# Patient Record
Sex: Male | Born: 1940
Health system: Southern US, Community
[De-identification: ages and names within clinical notes are randomized; demographics above are authoritative.]

## PROBLEM LIST (undated history)

## (undated) DIAGNOSIS — K5792 Diverticulitis of intestine, part unspecified, without perforation or abscess without bleeding: Secondary | ICD-10-CM

## (undated) DIAGNOSIS — C801 Malignant (primary) neoplasm, unspecified: Secondary | ICD-10-CM

## (undated) DIAGNOSIS — I10 Essential (primary) hypertension: Secondary | ICD-10-CM

## (undated) DIAGNOSIS — I609 Nontraumatic subarachnoid hemorrhage, unspecified: Secondary | ICD-10-CM

## (undated) DIAGNOSIS — Z860100 Personal history of colon polyps, unspecified: Secondary | ICD-10-CM

## (undated) DIAGNOSIS — E785 Hyperlipidemia, unspecified: Secondary | ICD-10-CM

## (undated) DIAGNOSIS — N200 Calculus of kidney: Secondary | ICD-10-CM

## (undated) DIAGNOSIS — M8580 Other specified disorders of bone density and structure, unspecified site: Secondary | ICD-10-CM

## (undated) DIAGNOSIS — C9000 Multiple myeloma not having achieved remission: Secondary | ICD-10-CM

## (undated) HISTORY — DX: Personal history of colon polyps, unspecified: Z86.0100

## (undated) HISTORY — DX: Nontraumatic subarachnoid hemorrhage, unspecified: I60.9

## (undated) HISTORY — DX: Multiple myeloma not having achieved remission: C90.00

## (undated) HISTORY — DX: Essential (primary) hypertension: I10

## (undated) HISTORY — DX: Other specified disorders of bone density and structure, unspecified site: M85.80

## (undated) HISTORY — DX: Personal history of colonic polyps: Z86.010

## (undated) HISTORY — DX: Hyperlipidemia, unspecified: E78.5

## (undated) HISTORY — DX: Diverticulitis of intestine, part unspecified, without perforation or abscess without bleeding: K57.92

## (undated) HISTORY — DX: Calculus of kidney: N20.0

## (undated) HISTORY — PX: OTHER SURGICAL HISTORY: SHX169

---

## 1999-02-12 DIAGNOSIS — I609 Nontraumatic subarachnoid hemorrhage, unspecified: Secondary | ICD-10-CM

## 1999-02-12 HISTORY — DX: Nontraumatic subarachnoid hemorrhage, unspecified: I60.9

## 1999-02-20 ENCOUNTER — Inpatient Hospital Stay (HOSPITAL_COMMUNITY): Admission: EM | Admit: 1999-02-20 | Discharge: 1999-02-27 | Payer: Self-pay | Admitting: Neurosurgery

## 1999-02-20 ENCOUNTER — Encounter: Payer: Self-pay | Admitting: Emergency Medicine

## 1999-03-06 ENCOUNTER — Ambulatory Visit (HOSPITAL_COMMUNITY): Admission: RE | Admit: 1999-03-06 | Discharge: 1999-03-06 | Payer: Self-pay | Admitting: Neurosurgery

## 1999-03-09 ENCOUNTER — Observation Stay (HOSPITAL_COMMUNITY): Admission: RE | Admit: 1999-03-09 | Discharge: 1999-03-09 | Payer: Self-pay | Admitting: Neurosurgery

## 2001-02-20 ENCOUNTER — Ambulatory Visit (HOSPITAL_COMMUNITY): Admission: RE | Admit: 2001-02-20 | Discharge: 2001-02-20 | Payer: Self-pay | Admitting: Gastroenterology

## 2001-02-20 ENCOUNTER — Encounter (INDEPENDENT_AMBULATORY_CARE_PROVIDER_SITE_OTHER): Payer: Self-pay | Admitting: Specialist

## 2006-06-10 ENCOUNTER — Encounter: Payer: Self-pay | Admitting: Family Medicine

## 2007-06-15 ENCOUNTER — Encounter: Payer: Self-pay | Admitting: Family Medicine

## 2008-05-24 ENCOUNTER — Encounter: Payer: Self-pay | Admitting: Family Medicine

## 2008-05-31 DIAGNOSIS — M899 Disorder of bone, unspecified: Secondary | ICD-10-CM | POA: Insufficient documentation

## 2008-05-31 DIAGNOSIS — Z860100 Personal history of colon polyps, unspecified: Secondary | ICD-10-CM | POA: Insufficient documentation

## 2008-05-31 DIAGNOSIS — Z8719 Personal history of other diseases of the digestive system: Secondary | ICD-10-CM

## 2008-05-31 DIAGNOSIS — M949 Disorder of cartilage, unspecified: Secondary | ICD-10-CM

## 2008-05-31 DIAGNOSIS — I10 Essential (primary) hypertension: Secondary | ICD-10-CM

## 2008-06-17 ENCOUNTER — Ambulatory Visit: Payer: Self-pay | Admitting: Family Medicine

## 2008-06-18 LAB — CONVERTED CEMR LAB
Alkaline Phosphatase: 114 units/L (ref 39–117)
BUN: 29 mg/dL — ABNORMAL HIGH (ref 6–23)
Basophils Absolute: 0 10*3/uL (ref 0.0–0.1)
Bilirubin, Direct: 0.1 mg/dL (ref 0.0–0.3)
CO2: 31 meq/L (ref 19–32)
Calcium: 9.6 mg/dL (ref 8.4–10.5)
Creatinine, Ser: 1.6 mg/dL — ABNORMAL HIGH (ref 0.4–1.5)
Eosinophils Absolute: 0.1 10*3/uL (ref 0.0–0.7)
Glucose, Bld: 106 mg/dL — ABNORMAL HIGH (ref 70–99)
HDL: 37.9 mg/dL — ABNORMAL LOW (ref >39.00)
Lymphocytes Relative: 20.2 % (ref 12.0–46.0)
MCHC: 34.3 g/dL (ref 30.0–36.0)
Neutrophils Relative %: 71.4 % (ref 43.0–77.0)
Platelets: 232 10*3/uL (ref 150.0–400.0)
RBC: 4.92 M/uL (ref 4.22–5.81)
RDW: 12.6 % (ref 11.5–14.6)
Total Bilirubin: 0.6 mg/dL (ref 0.3–1.2)
Total CHOL/HDL Ratio: 5
Triglycerides: 116 mg/dL (ref 0.0–149.0)

## 2008-07-03 ENCOUNTER — Ambulatory Visit: Payer: Self-pay | Admitting: Family Medicine

## 2008-07-08 LAB — CONVERTED CEMR LAB
CO2: 29 meq/L (ref 19–32)
GFR calc non Af Amer: 53.34 mL/min (ref >60)
Glucose, Bld: 106 mg/dL — ABNORMAL HIGH (ref 70–99)
Potassium: 4.4 meq/L (ref 3.5–5.1)
Sodium: 139 meq/L (ref 135–145)

## 2008-08-19 ENCOUNTER — Telehealth: Payer: Self-pay | Admitting: Family Medicine

## 2008-11-25 ENCOUNTER — Ambulatory Visit: Payer: Self-pay | Admitting: Family Medicine

## 2009-03-05 ENCOUNTER — Ambulatory Visit: Payer: Self-pay | Admitting: Family Medicine

## 2009-03-10 ENCOUNTER — Telehealth: Payer: Self-pay | Admitting: Family Medicine

## 2009-05-14 ENCOUNTER — Ambulatory Visit: Payer: Self-pay | Admitting: Family Medicine

## 2009-08-15 ENCOUNTER — Telehealth: Payer: Self-pay | Admitting: Family Medicine

## 2009-09-08 ENCOUNTER — Ambulatory Visit: Payer: Self-pay | Admitting: Family Medicine

## 2009-09-08 DIAGNOSIS — E785 Hyperlipidemia, unspecified: Secondary | ICD-10-CM | POA: Insufficient documentation

## 2009-09-08 LAB — CONVERTED CEMR LAB
HDL goal, serum: 40 mg/dL
LDL Goal: 100 mg/dL

## 2009-09-09 LAB — CONVERTED CEMR LAB
BUN: 23 mg/dL (ref 6–23)
CO2: 29 meq/L (ref 19–32)
Cholesterol: 154 mg/dL (ref 0–200)
GFR calc non Af Amer: 43.37 mL/min (ref >60)
Glucose, Bld: 97 mg/dL (ref 70–99)
HDL: 33.1 mg/dL — ABNORMAL LOW (ref >39.00)
Potassium: 4.8 meq/L (ref 3.5–5.1)
VLDL: 17.2 mg/dL (ref 0.0–40.0)

## 2009-12-15 ENCOUNTER — Ambulatory Visit: Payer: Self-pay | Admitting: Family Medicine

## 2010-02-02 ENCOUNTER — Other Ambulatory Visit: Payer: Self-pay | Admitting: Dermatology

## 2010-02-10 NOTE — Assessment & Plan Note (Signed)
Summary: ? fever? chest congestion/dm   Vital Signs:  Patient profile:   70 year old male Temp:     98.8 degrees F oral BP sitting:   110 / 68  (left arm) Cuff size:   regular  Vitals Entered By: Sid Falcon LPN (March 05, 2009 9:51 AM) CC: Fever, diverticulitis flare-up, Abdominal Pain   History of Present Illness: acute visit  patient seen with onset last Friday low-grade fever, chest congestion, nasal congestion, and mild body aches. Wife with similar respiratory symptoms. Those symptoms are slowly improving. Now has some left lower quadrant mild abdominal pain and soreness without radiation. Similar to previous diverticulitis flare. Decreased appetite no vomiting or diarrhea. Fever has ranged from 100.4-101 over the past couple of days. No fever today. No bloody stools.       This is a 70 year old man who presents with Abdominal Pain.  The patient reports anorexia, but denies nausea, vomiting, diarrhea, constipation, melena, hematochezia, and hematemesis.  The location of the pain is left lower quadrant.  The pain is described as constant and dull.  Associated symptoms include fever.  The patient denies the following symptoms: dysuria and dark urine.    Allergies: 1)  ! Penicillin V Potassium (Penicillin V Potassium)  Past History:  Past Medical History: Last updated: 06/17/2008 Colonic polyps, hx of Diverticulitis, hx of Hypertension Osteopenia hearing loss cigarettes Subcranial hemmorrhage Kidney stones PMH reviewed for relevance  Review of Systems      See HPI       The patient complains of anorexia and fever.  The patient denies weight loss, weight gain, chest pain, syncope, dyspnea on exertion, peripheral edema, prolonged cough, headaches, hemoptysis, melena, hematochezia, and severe indigestion/heartburn.    Physical Exam  General:  Well-developed,well-nourished,in no acute distress; alert,appropriate and cooperative throughout examination Eyes:  pupils  equal, pupils round, and pupils reactive to light.   Ears:  External ear exam shows no significant lesions or deformities.  Otoscopic examination reveals clear canals, tympanic membranes are intact bilaterally without bulging, retraction, inflammation or discharge. Hearing is grossly normal bilaterally. Mouth:  Oral mucosa and oropharynx without lesions or exudates.  Teeth in good repair. Neck:  No deformities, masses, or tenderness noted. Lungs:  Normal respiratory effort, chest expands symmetrically. Lungs are clear to auscultation, no crackles or wheezes. Heart:  normal rate and regular rhythm.   Abdomen:  soft and normal bowel sounds.  minimal tenderness left lower quadrant to deep palpation. No guarding or rebound. No masses Extremities:  No clubbing, cyanosis, edema, or deformity noted with normal full range of motion of all joints.   Cervical Nodes:  No lymphadenopathy noted   Impression & Recommendations:  Problem # 1:  DIVERTICULITIS, ACUTE (ICD-562.11) Assessment New start Cipro 750 mg two times a day for 10 days and add Flagyl in 1-2 days if not promptly improving.  Problem # 2:  VIRAL URI (ICD-465.9) Assessment: New  His updated medication list for this problem includes:    Aspirin Adult Low Strength 81 Mg Tbec (Aspirin) ..... Once daily  Complete Medication List: 1)  Lisinopril-hydrochlorothiazide 10-12.5 Mg Tabs (Lisinopril-hydrochlorothiazide) .... Once daily 2)  Aspirin Adult Low Strength 81 Mg Tbec (Aspirin) .... Once daily 3)  Ciprofloxacin Hcl 750 Mg Tabs (Ciprofloxacin hcl) .... One by mouth two times a day for 10 days  Patient Instructions: 1)  follow up promptly if you have any worsening abdominal pain or increased fever or if abdominal symptoms not improving over the next  couple of days Prescriptions: CIPROFLOXACIN HCL 750 MG TABS (CIPROFLOXACIN HCL) one by mouth two times a day for 10 days  #20 x 0   Entered and Authorized by:   Evelena Peat MD   Signed by:    Evelena Peat MD on 03/05/2009   Method used:   [facility] [address]       [address]       [place], [place]  [address]       Ph: [phone] or [phone]       Fax: [phone]   RxID:   [id number]

## 2010-02-10 NOTE — Progress Notes (Signed)
Summary: James Marks  Phone Note Call from Patient Call back at [employer] [phone]   Caller: Spouse Call For: James Peat MD Summary of Call: pt needs #90 with 3 refill on linsinopril -hctz 10-12.5mg  [facility] [phone] Initial call taken by: Heron Sabins,  August 15, 2009 10:00 AM    Prescriptions: LISINOPRIL-HYDROCHLOROTHIAZIDE 10-12.5 MG TABS (LISINOPRIL-HYDROCHLOROTHIAZIDE) once daily  #90 x 3   Entered by:   Sid Falcon LPN   Authorized by:   James Peat MD   Signed by:   Sid Falcon LPN on 61/60/7371   Method used:   Faxed to ...       [facility] ([facility])       [address] [place], [place]  [address]       Ph: [phone]       Fax: [phone]   RxID:   [id number]

## 2010-02-10 NOTE — Progress Notes (Signed)
Summary: Call-A-Nurse Report    Call-A-Nurse Triage Call Report Triage Record Num: [id number] Operator: Frederico Hamman Patient Name: James Marks Call Date & Time: 03/08/2009 5:07:25PM Patient Phone: [phone] PCP: Evelena Peat Patient Gender: Male PCP Fax : [phone] Patient DOB: [birth year] [facility] Name: [facility] Reason for Call: Melvyn Neth started on Cipro 750mg  BID for diverticulitis. Had onset of being jittery on 2/24 and cannot sleep. States no improvement in diarrhea. States stomach does not feel better. C/O weakness. Having tremors in hands- onset 2/24. Less appetite. Dr. Clent Ridges paged. Advised to stop Cipro. Verbal order given for Metronidazole 500 mg po BID x 10 days. [phone]. Advised pt to go to ED this weekend if fever occurs. If feeling no better on 2/28 pt to call office to schedule same day appt for evaluation. Care advice given. Protocol(s) Used: Diarrhea / Change in Bowel Habits Recommended Outcome per Protocol: See Provider within 24 hours Reason for Outcome: Diarrhea lasting longer than 48 hours AND not improving with home care Care Advice: Go to the ED if you have developed signs and symptoms of dehydration such as dry mouth and tongue; increased pulse rate at rest; concentrated urine; no urine output for 8 hours or more; increasing weakness or drowsiness, or lightheadedness when trying to sit upright or standing.  ~  ~ SYMPTOM / CONDITION MANAGEMENT 03/08/2009 5:44:33PM Page 1 of 1 [id number]

## 2010-02-10 NOTE — Assessment & Plan Note (Signed)
Summary: flu shot/cjr  Nurse Visit Flu Vaccine Consent Questions     Do you have a history of severe allergic reactions to this vaccine? no    Any prior history of allergic reactions to egg and/or gelatin? no    Do you have a sensitivity to the preservative Thimersol? no    Do you have a past history of Guillan-Barre Syndrome? no    Do you currently have an acute febrile illness? no    Have you ever had a severe reaction to latex? no    Vaccine information given and explained to patient? yes    Are you currently pregnant? no    Lot Number:[id number]   Exp Date:07/11/2010   Site Given  Left Deltoid IM   Allergies: 1)  ! Penicillin V Potassium (Penicillin V Potassium)  Orders Added: 1)  Flu Vaccine 72yrs + MEDICARE PATIENTS [Q2039] 2)  Administration Flu vaccine - MCR [G0008]

## 2010-02-10 NOTE — Assessment & Plan Note (Signed)
Summary: emp---will fast//ccm   Vital Signs:  Patient profile:   70 year old male Height:      74 inches Weight:      183 pounds BMI:     23.58 Temp:     97.6 degrees F oral Pulse rate:   60 / minute Pulse rhythm:   regular Resp:     12 per minute BP sitting:   132 / 68  (left arm) Cuff size:   regular  Vitals Entered By: Sid Falcon LPN (September 08, 2009 8:31 AM) CC: CPX, fasting for labs, Hypertension Management, Lipid Management   History of Present Illness: Here for Medicare AWV:  1.   Risk factors based on Past M, S, F history:  Smoker.  Hypertension well controlled. Pos FH of diabetes in parent.  Hx of benign colon polyps. 2.   Physical Activities: Plays golf frequently.  NO formal exercise. 3.   Depression/mood: No hx of depression.  No anxiety issues.  No issues of anhedonia, sleep disturbance, low motivation, feelings of helplessness, or depressed mood. 4.   Hearing: hearing defecit.  Refuses hearing aid adjustment.  Only has aid for L ear.  Has some difficulty with high pitch noise and conversation.  Has seen audiologist within the past year. 5.   ADL's: Fully independent in all ADLs. 6.   Fall Risk: low.  He has good vision.  No balance issues.  No orthopedic impairments. No vestibular problems. 7.   Home Safety: No concerns identified.  no fall risk factors. 8.   Height, weight, &visual acuity:  Ht and weight stable.  Long distance vision is normal. 9.   Counseling: Smoking cessation counseling provided.  Needs more regular exercise.  Flu vaccine by Oct.  He will check on Zostavax coverage. 10.   Labs ordered based on risk factors: lipid, BMP, PSA. 11.           Referral Coordination  No referrals needed at this time.  He is encouraged to f/u with audiology regarding hearing aid issues. 12.           Care Plan- labs as above.  Meds refilled.  Check on shingles vaccine. Flu vaccine by Oct. 13.            Cognitive Assessment  Pt has no problems with short term or long  term memory.  No cognitive impairments.  Good reasoning skills.  Able to think independently.   Also here for follow up medical problems.  Htn.  Also has hx of mild hperlipidemia.  No hx of CAD.   Hypertension History:      He denies headache, chest pain, palpitations, dyspnea with exertion, orthopnea, PND, peripheral edema, visual symptoms, neurologic problems, syncope, and side effects from treatment.  He notes no problems with any antihypertensive medication side effects.        Positive major cardiovascular risk factors include male age 54 years old or older, hyperlipidemia, hypertension, and current tobacco user.  Negative major cardiovascular risk factors include no history of diabetes and negative family history for ischemic heart disease.        Further assessment for target organ damage reveals no history of ASHD, stroke/TIA, or peripheral vascular disease.    Lipid Management History:      Positive NCEP/ATP III risk factors include male age 73 years old or older, HDL cholesterol less than 40, current tobacco user, and hypertension.  Negative NCEP/ATP III risk factors include non-diabetic, no family history for ischemic heart  disease, no ASHD (atherosclerotic heart disease), no prior stroke/TIA, no peripheral vascular disease, and no history of aortic aneurysm.      Clinical Review Panels:  Prevention   Last Colonoscopy:  normal (01/11/2006)   Last PSA:  0.80 (06/17/2008)  Immunizations   Last Tetanus Booster:  Historical (01/11/2005)   Last Flu Vaccine:  Fluvax 3+ (11/25/2008)   Last Pneumovax:  Historical (01/12/2004)   Allergies: 1)  ! Penicillin V Potassium (Penicillin V Potassium)  Past History:  Past Medical History: Last updated: 06/17/2008 Colonic polyps, hx of Diverticulitis, hx of Hypertension Osteopenia hearing loss cigarettes Subcranial hemmorrhage Kidney stones  Family History: Last updated: 06/17/2008 Family history breast cancer, mother Family  history diabetes, parent  Social History: Last updated: 06/17/2008 Retired, Airline pilot Married Current Smoker Alcohol use-yes  Risk Factors: Smoking Status: current (06/17/2008) PMH-FH-SH reviewed for relevance  Review of Systems       The patient complains of decreased hearing.  The patient denies anorexia, fever, weight loss, weight gain, vision loss, hoarseness, chest pain, syncope, dyspnea on exertion, peripheral edema, prolonged cough, headaches, hemoptysis, abdominal pain, melena, hematochezia, severe indigestion/heartburn, hematuria, incontinence, muscle weakness, suspicious skin lesions, transient blindness, difficulty walking, depression, enlarged lymph nodes, and testicular masses.    Physical Exam  General:  Well-developed,well-nourished,in no acute distress; alert,appropriate and cooperative throughout examination  Somewhat hard of hearing. Head:  Normocephalic and atraumatic without obvious abnormalities. No apparent alopecia or balding. Eyes:  pupils equal, pupils round, and pupils reactive to light.   Ears:  External ear exam shows no significant lesions or deformities.  Otoscopic examination reveals clear canals, tympanic membranes are intact bilaterally without bulging, retraction, inflammation or discharge. Hearing is grossly normal bilaterally. Mouth:  Oral mucosa and oropharynx without lesions or exudates.  Teeth in good repair. Neck:  No deformities, masses, or tenderness noted. Lungs:  Normal respiratory effort, chest expands symmetrically. Lungs are clear to auscultation, no crackles or wheezes. Heart:  normal rate, regular rhythm, and no murmur.   Abdomen:  Bowel sounds positive,abdomen soft and non-tender without masses, organomegaly or hernias noted. Rectal:  No external abnormalities noted. Normal sphincter tone. No rectal masses or tenderness. Prostate:  Prostate gland firm and smooth, no enlargement, nodularity, tenderness, mass, asymmetry or induration. Msk:  No  deformity or scoliosis noted of thoracic or lumbar spine.   Extremities:  No clubbing, cyanosis, edema, or deformity noted with normal full range of motion of all joints.   Neurologic:  alert & oriented X3, cranial nerves II-XII intact, strength normal in all extremities, and gait normal.   Skin:  multiple seb keratoses. Cervical Nodes:  No lymphadenopathy noted Psych:  normally interactive, good eye contact, not anxious appearing, and not depressed appearing.     Impression & Recommendations:  Problem # 1:  HYPERTENSION (ICD-401.9)  His updated medication list for this problem includes:    Lisinopril-hydrochlorothiazide 10-12.5 Mg Tabs (Lisinopril-hydrochlorothiazide) ..... Once daily  Orders: Venipuncture (81191) Specimen Handling (47829) TLB-BMP (Basic Metabolic Panel-BMET) (80048-METABOL)  Problem # 2:  HYPERLIPIDEMIA (ICD-272.4)  Orders: Venipuncture (56213) Specimen Handling (08657) TLB-Lipid Panel (80061-LIPID)  Problem # 3:  ROUTINE GENERAL MEDICAL EXAM@HEALTH  CARE FACL (ICD-V70.0)  Orders: Venipuncture (84696) Specimen Handling (29528) Medicare -1st Annual Wellness Visit [id number]) TLB-PSA (Prostate Specific Antigen) (84153-PSA)  Complete Medication List: 1)  Lisinopril-hydrochlorothiazide 10-12.5 Mg Tabs (Lisinopril-hydrochlorothiazide) .... Once daily 2)  Aspirin Adult Low Strength 81 Mg Tbec (Aspirin) .... Once daily  Hypertension Assessment/Plan:      The patient's hypertensive risk group  is category B: At least one risk factor (excluding diabetes) with no target organ damage.  His calculated 10 year risk of coronary heart disease is 40 %.  Today's blood pressure is 132/68.    Lipid Assessment/Plan:      Based on NCEP/ATP III, the patient's risk factor category is "0-1 risk factors".  The patient's lipid goals are as follows: Total cholesterol goal is 200; LDL cholesterol goal is 100; HDL cholesterol goal is 40; Triglyceride goal is 150.    Patient  Instructions: 1)  Stop smoking tips: Choose a quit date. Cut down before the quit date. Decide what you will do as a substitute when you feel the urge to smoke(gum, toothpick, exercise).  2)  It is important that you exercise reguarly at least 20 minutes 5 times a week. If you develop chest pain, have severe difficulty breathing, or feel very tired, stop exercising immediately and seek medical attention.

## 2010-02-10 NOTE — Assessment & Plan Note (Signed)
Summary: severe lower back pain/cjr   Vital Signs:  Patient profile:   70 year old male Temp:     98.7 degrees F oral BP sitting:   102 / 64  (left arm) Cuff size:   regular  Vitals Entered By: Sid Falcon LPN (May 14, 1608 9:32 AM) CC: low back pain X 5 days   History of Present Illness: Acute visit for low back pain. Started last weekend after lifting a mattress. Pain lower lumbar area without radiation. Worse with flexion and sitting. Improved with standing. No radiculopathy symptoms. Denies any numbness or weakness. Aleve and ice have not helped. No incontinence symptoms. Denies any constitutional symptoms such as fever, chills, dysuria, appetite, or weight changes.  Allergies: 1)  ! Penicillin V Potassium (Penicillin V Potassium)  Past History:  Past Medical History: Last updated: 06/17/2008 Colonic polyps, hx of Diverticulitis, hx of Hypertension Osteopenia hearing loss cigarettes Subcranial hemmorrhage Kidney stones  Review of Systems      See HPI  Physical Exam  General:  Well-developed,well-nourished,in no acute distress; alert,appropriate and cooperative throughout examination Lungs:  Normal respiratory effort, chest expands symmetrically. Lungs are clear to auscultation, no crackles or wheezes. Heart:  normal rate and regular rhythm.   Msk:  slightly tender lower lumbar area diffusely straight leg raise is negative bilaterally Extremities:  no edema Neurologic:  no strength deficits. Normal sensory function. Symmetric reflexes lower extremities   Impression & Recommendations:  Problem # 1:  LUMBAGO (ICD-724.2) trial of anti-inflammatory and low dose muscle relaxer. Consider physical therapy if no better 2 weeks His updated medication list for this problem includes:    Aspirin Adult Low Strength 81 Mg Tbec (Aspirin) ..... Once daily    Cyclobenzaprine Hcl 5 Mg Tabs (Cyclobenzaprine hcl) ..... One by mouth q hs as needed back pain    Diclofenac Sodium  75 Mg Tbec (Diclofenac sodium) ..... One by mouth two times a day as needed back pain  Complete Medication List: 1)  Lisinopril-hydrochlorothiazide 10-12.5 Mg Tabs (Lisinopril-hydrochlorothiazide) .... Once daily 2)  Aspirin Adult Low Strength 81 Mg Tbec (Aspirin) .... Once daily 3)  Ciprofloxacin Hcl 750 Mg Tabs (Ciprofloxacin hcl) .... One by mouth two times a day for 10 days 4)  Cyclobenzaprine Hcl 5 Mg Tabs (Cyclobenzaprine hcl) .... One by mouth q hs as needed back pain 5)  Diclofenac Sodium 75 Mg Tbec (Diclofenac sodium) .... One by mouth two times a day as needed back pain  Patient Instructions: 1)  Most patients (90%) with low back pain will improve with time ( 2-6 weeks). Keep active but avoid activities that are painful. Apply moist heat and/or ice to lower back several times a day.  Prescriptions: DICLOFENAC SODIUM 75 MG TBEC (DICLOFENAC SODIUM) one by mouth two times a day as needed back pain  #60 x 0   Entered and Authorized by:   Evelena Peat MD   Signed by:   Evelena Peat MD on 05/14/2009   Method used:   [facility]        [facility] [address] [facility]       [address]       [place], [place]  [address]       Ph: [phone] or [phone]       Fax: [phone]   RxID:   [id number] CYCLOBENZAPRINE HCL 5 MG TABS (CYCLOBENZAPRINE HCL) one by mouth q hs as needed back pain  #20 x 0   Entered and  Authorized by:   Evelena Peat MD   Signed by:   Evelena Peat MD on 05/14/2009   Method used:   [facility]        [facility]  [place] [address] [facility]       [address]       [place], [place]  [address]       Ph: [phone] or [phone]       Fax: [phone]   RxID:   [id number]   Appended Document: Orders Update    Clinical Lists Changes  Orders: Added new Service order of Prescription Created Electronically [phone]) - Signed Added new Service order of Est. Patient Level III (02542) - Signed

## 2010-05-13 ENCOUNTER — Encounter: Payer: Self-pay | Admitting: Family Medicine

## 2010-05-13 ENCOUNTER — Ambulatory Visit (INDEPENDENT_AMBULATORY_CARE_PROVIDER_SITE_OTHER): Payer: Medicare Other | Admitting: Family Medicine

## 2010-05-13 VITALS — BP 102/82 | Temp 98.2°F | Ht 74.0 in | Wt 181.0 lb

## 2010-05-13 DIAGNOSIS — R05 Cough: Secondary | ICD-10-CM

## 2010-05-13 MED ORDER — DOXYCYCLINE HYCLATE 100 MG PO TABS: 100.0000 mg | ORAL_TABLET | Freq: Two times a day (BID) | ORAL | Status: AC

## 2010-05-13 NOTE — Patient Instructions (Signed)
Be in touch if cough not resolved in 2 weeks.

## 2010-05-13 NOTE — Progress Notes (Signed)
  Subjective:    Patient ID: James Marks, male    DOB: [birth year], 70 y.o.   MRN: [id number]  HPI Patient seen with 2 week history of productive cough. Long history of smoking. No dyspnea. No wheezing. Denies any hemoptysis, fever, chills, or any pleuritic pain. Minimal nasal congestive symptoms. Has continued to play golf regularly. No clear increase in dyspnea. Denies chest pain.   Review of Systems  Constitutional: Negative for fever, chills, activity change, appetite change, fatigue and unexpected weight change.  HENT: Negative for ear pain, congestion, sore throat and trouble swallowing.   Respiratory: Positive for cough. Negative for shortness of breath, wheezing and stridor.   Cardiovascular: Negative for chest pain and leg swelling.  Gastrointestinal: Negative for abdominal pain.  Musculoskeletal: Negative for arthralgias.  Skin: Negative for rash.  Neurological: Negative for syncope and headaches.  Hematological: Negative for adenopathy.       Objective:   Physical Exam  Constitutional: He appears well-developed and well-nourished.  HENT:  Right Ear: External ear normal.  Left Ear: External ear normal.  Mouth/Throat: No oropharyngeal exudate.  Cardiovascular: Normal rate, regular rhythm and normal heart sounds.   Pulmonary/Chest: Effort normal and breath sounds normal. No respiratory distress. He has no wheezes. He has no rales.  Musculoskeletal: He exhibits no edema.  Lymphadenopathy:    He has no cervical adenopathy.          Assessment & Plan:  Cough in a patient with likely some COPD. Encouraged to stop smoking. Doxycycline 100 mg twice daily for 10 days. Chest x-ray if no better in 2 weeks

## 2010-05-29 NOTE — H&P (Signed)
[place]. [facility]  Patient:    James Marks, James Marks                      MRN: [id number] Adm. Date:  [id number] Attending:  Tressie Stalker D Dictator:   Cristi Loron, M.D. CC:         [facility]. [facility]                         History and Physical  CHIEF COMPLAINT:  Headache.  HISTORY OF PRESENT ILLNESS:  The patient is a 70 year old white male who was in his usual state of health until this evening at approximately 1800 when he was driving home and had acute onset of headache without any precipitating trauma.  He drove home and discussed this with his wife and his wife called their family doctor, r. Waltrin, and he recommended they go to the emergency department.  They called 911 and the patient was transported to [facility] Emergency Department via EMS.  He was evaluated by Dr. Verlan Friends including cranial CT scan that demonstrated subarachnoid hemorrhage, and neurosurgical consultation was requested.  The patient complains of headache and meningismus.  He denies photophobia.  He as had no seizures, nausea, or vomiting.  His headache is severe.  There is no loss of consciousness.  PAST MEDICAL HISTORY:  Positive for diverticulitis/diverticulosis.  He had a recent flare-up and has been treated with Cipro b.i.d. for this.  The patient has decreased hearing bilaterally (chronic).  PAST SURGICAL HISTORY:  Colonic polypectomy, which was benign.  MEDICATIONS PRIOR TO ADMISSION: 1.  Cipro b.i.d.  ALLERGIES:  No known drug allergies.  FAMILY HISTORY:  The patients mother died at age 77, she had Alzheimers disease  and hypertension and diabetes mellitus.  The patients father is alive at age 96.  SOCIAL HISTORY:  The patient is married and has one stepson, one Museum/gallery conservator, nd one son.  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  He smokes 1/2 pack per day of cigarettes for approximately 45 years.  He denies ethanol or drug  use.  [place]  REVIEW OF SYSTEMS:  Negative except as above.  PHYSICAL EXAMINATION:  GENERAL:  Pleasant, well-developed, well-nourished 70 year old white male complaining of severe headache.  VITAL SIGNS:  Temperature 96.8 degrees Fahrenheit orally.  Heart rate 77, respiratory rate 16, blood pressure 131/73.  HEENT:  Head normocephalic, atraumatic.  PERRL.  EOMI.  Sclerae white. Conjunctivae pink.  Oropharynx benign.  NECK:  Somewhat tender to palpation.  He does have meningismus.  No masses, deformities, tracheal deviation, jugular venous distention, or carotid bruits.  Thorax symmetric.  LUNGS:  Clear to auscultation.  HEART:  Regular rate and rhythm.  ABDOMEN:  Soft, nontender.  EXTREMITIES:  No obvious deformities.  NEUROLOGIC:  The patient is alert and oriented x three.  Glasgow coma scale 15.  Cranial nerves II-XII grossly intact bilaterally except he has decreased hearing bilaterally (chronic).  He wears hearing aids.  Vision is grossly normal bilaterally.  Motor strength 5/5 in deltoids, biceps, triceps, hand grips, wrist extensors, interosseous psoas, quadriceps, gastrocnemius, extensor hallucis longus. Deep tendon reflexes are 2+/4 in bilateral triceps, brachial radialis, quadriceps, gastrocnemius, extensor plantar reflexes bilaterally.  No ankle clonus. Sensory exam is grossly normal to light touch in all dermatomes bilaterally. Cerebellar exam is intact.  Good rapid alternating movements of the upper extremities bilaterally.  IMAGING STUDIES:  I have reviewed the patients cranial CT scan performed at [facility] on February 20, 1999.  It demonstrates that he has a subarachnoid  hemorrhage in the basal cisterns and also has some blood in the fourth ventricle with some mild ventriculomegaly.  ASSESSMENT/PLAN:   Grade II subarachnoid hemorrhage.  I have discussed the situation with the patient and his wife and  children.  I have told them that in the absence of trauma the most likely cause for his hemorrhage would be aneurysm, though he also could have parametra cephalic hemorrhage.  I have recommended that we transfer him to [facility] for further evaluation and management and that he get a cerebral arteriogram to see if he has an aneurysm.  I will start im on Decadron, Dilantin, Pepcid, amlodipine.  The patients family is agreeable to  this plan. If the patients arteriogram turns out to show an aneurysm I may possibly do surgery tomorrow morning.  History of diverticulosis noted. DD:  02/20/99 TD:  02/21/99 Job: [id number]

## 2010-05-29 NOTE — Op Note (Signed)
[place]. [facility]  Patient:    James Marks, James Marks Visit Number: [id number] MRN: [id number]          Service Type: END Location: ENDO Attending Physician:  Nelda Marseille Dictated by:   Petra Kuba, M.D. Proc. Date: 02/20/01 Admit Date:  02/20/2001   CC:         Nolon Nations, M.D., M.P.H.   Operative Report  PROCEDURE:  Colonoscopy with biopsy.  INDICATION:  Screening in a patient with a history of colon polyps.  Consent was signed after risks, benefits, methods, options thoroughly discussed in the office in the past and before this procedure.  MEDICATIONS:  Demerol 75 mg, Versed 7.5 mg.  DESCRIPTION OF PROCEDURE:  Rectal inspection was pertinent for external hemorrhoids, small.  Digital exam was negative.  Video pediatric adjustable colonoscope was inserted and easily advanced around the colon to the cecum. This did not require any abdominal pressure or any position changes.  On insertion, some left-sided diverticula and wall edema were seen but no other abnormality.  The cecum was identified by the appendiceal orifice and the ileocecal valve.  The prep was adequate.  There was some liquid stool that required washing and suctioning.  On slow withdrawal through the colon, the cecum and the ascending were normal.  There was a probable midtransverse lipoma, which was cold biopsied but had a customary appearance and soft, marshmallow-like consistency with biopsies.  The scope was further withdrawn. The sigmoid did have some diverticula, wall edema, and some inflammation, difficult to inflate.  No obvious polypoid lesions or masses were seen, but we did take scattered biopsies of some of the edematous folds just to make sure, but he did not hold air well in this location.  The scope was further withdrawn.  There were multiple tiny hyperplastic-appearing distal sigmoid and rectal polyps, which were cold biopsied, put in a third container.   The scope was retroflexed, pertinent for some internal hemorrhoids.  One tiny polyp was biopsied on retroflexion.  The scope was straightened and air was suctioned, scope removed.  The patient tolerated the procedure well.  There was no obvious immediate complication.  ENDOSCOPIC DIAGNOSES: 1. Internal-external hemorrhoids. 2. Multiple tiny rectal-distal sigmoid questionable polyps, hyperplastic-    appearing, cold biopsied. 3. Sigmoid diverticula and wall edema and inflammation, biopsied. 4. Transverse lipoma, status post biopsy. 5. Otherwise within normal limits to the cecum.  PLAN:  Await pathology.  GI follow-up p.r.n., otherwise yearly rectals per Dr. Corine Shelter.  Continue Metamucil, since that helps his diverticula and prevents recurrence. Dictated by:   Petra Kuba, M.D. Attending Physician:  Nelda Marseille DD:  02/20/01 TD:  02/20/01 Job: [id number]

## 2010-05-29 NOTE — Discharge Summary (Signed)
[place]. [facility]  Patient:    James Marks, James Marks                      MRN: [id number] Adm. Date:  [id number] Disc. Date: [id number] Attending:  Tressie Stalker D CC:         Dr. Loistine Simas; [facility]                           Discharge Summary  NOTE:  For full details of this admission, please refer to the typed history and physical.  BRIEF HISTORY:  The patient is a 70 year old, white male who had a sudden onset of headache on February 20, 1999.  He was worked up at [facility], and a ______ CT scan demonstrated a subarachnoid hemorrhage.  I subsequently admitted the patient for further workup of nontraumatic subarachnoid hemorrhage.  PAST MEDICAL HISTORY/PAST SURGICAL HISTORY/MEDICATIONS PRIOR TO ADMISSION/DRUG ALLERGIES/FAMILY MEDICAL HISTORY/SOCIAL HISTORY/ADMISSION PHYSICAL EXAMINATION/ADMITTING ______/ASSESSMENT/PLAN/ETC.:  Please refer to the typed history and physical.  HOSPITAL COURSE:  I admitted the patient to [facility] on February 20, 1999 with a diagnosis of a nontraumatic subarachnoid hemorrhage. I obtained a cerebral arteriogram on the day of admission, and it demonstrated no evidence of intracranial aneurysms.  The patient was subsequently monitored in the intensive care unit.  His headaches improved over several days.  I obtained serial CTs, and transcranial Dopplers.  They demonstrated no evidence of hydrocephalus or cerebral vasospasm.  By February 27, 1999, the patient was afebrile.  Vital signs were stable.  He was neurologically normal.  His headache had improved.  There was no evidence of hydrocephalus or vasospasm as above, and he was requesting to be discharged to home.  I therefore discharged him home on February 27, 1999.  DISCHARGE INSTRUCTIONS:  We made arrangements for an outpatient repeat cerebral arteriogram to rule-out the slight possibility that he could have an aneurysm.  He  was instructed to follow up with me in one week.  DISCHARGE MEDICATION:  Vicodin p.r.n. for headache.  FINAL DIAGNOSE:  Perimesencephalic hemorrhage.  PROCEDURE PERFORMED:  Cerebral arteriogram. DD:  03/26/99 TD:  03/26/99 Job: [id number]

## 2010-07-09 ENCOUNTER — Other Ambulatory Visit: Payer: Self-pay | Admitting: *Deleted

## 2010-07-09 MED ORDER — LISINOPRIL-HYDROCHLOROTHIAZIDE 10-12.5 MG PO TABS: 1.0000 | ORAL_TABLET | Freq: Every day | ORAL | Status: DC

## 2010-07-09 NOTE — Telephone Encounter (Signed)
Rx sent to [facility]. 

## 2010-07-21 ENCOUNTER — Encounter: Payer: Self-pay | Admitting: Family Medicine

## 2010-09-10 ENCOUNTER — Encounter: Payer: Self-pay | Admitting: Family Medicine

## 2010-09-10 ENCOUNTER — Ambulatory Visit (INDEPENDENT_AMBULATORY_CARE_PROVIDER_SITE_OTHER): Payer: Medicare Other | Admitting: Family Medicine

## 2010-09-10 DIAGNOSIS — E785 Hyperlipidemia, unspecified: Secondary | ICD-10-CM

## 2010-09-10 DIAGNOSIS — I10 Essential (primary) hypertension: Secondary | ICD-10-CM

## 2010-09-10 DIAGNOSIS — Z0000 Encounter for general adult medical examination without abnormal findings: Secondary | ICD-10-CM

## 2010-09-10 LAB — BASIC METABOLIC PANEL
BUN: 23 mg/dL (ref 6–23)
CO2: 29 mEq/L (ref 19–32)
Calcium: 9.1 mg/dL (ref 8.4–10.5)
Creatinine, Ser: 1.6 mg/dL — ABNORMAL HIGH (ref 0.4–1.5)
Glucose, Bld: 95 mg/dL (ref 70–99)

## 2010-09-10 LAB — HEPATIC FUNCTION PANEL
AST: 22 U/L (ref 0–37)
Alkaline Phosphatase: 97 U/L (ref 39–117)
Bilirubin, Direct: 0 mg/dL (ref 0.0–0.3)
Total Bilirubin: 0.6 mg/dL (ref 0.3–1.2)

## 2010-09-10 LAB — LIPID PANEL
Total CHOL/HDL Ratio: 3
Triglycerides: 95 mg/dL (ref 0.0–149.0)

## 2010-09-10 NOTE — Progress Notes (Signed)
Subjective:    Patient ID: James Marks, male    DOB: [birth year], 70 y.o.   MRN: [id number]  HPI Patient seen for Medicare wellness exam. He has history of hypertension and mild dyslipidemia. History of low HDL. Currently takes lisinopril HCTZ for hypertension. Blood pressure variable control. No orthostasis.  Health maintenance issues addressed. Pneumovax age 44. Tetanus 2007. Colonoscopy in June.  1.  Risk factors based on Past Medical , Social, and Family history  reviewed and as below. Patient has ongoing nicotine use. Low motivation to quit 2.  Limitations in physical activities no limitation in activities. Golfs frequently. No falls 3.  Depression/mood no problems with depression or anxiety 4.  Hearing chronic hearing loss and uses hearing aids 5.  ADLs fully independent in all 6.  Cognitive function (orientation to time and place, language, writing, speech,memory) no recent short or long-term memory deficit. Speech intact. Judgment intact 7.  Home Safety no issues 8.  Height, weight, and visual acuity. Height stable. Normal appetite. No visual changes. 9.  Counseling smoking cessation recommended 10. Recommendation of preventive services. Discussed shingles vaccine. He currently has no coverage. Reminder for yearly flu vaccine 11. Labs based on risk factors lipid panel, hepatic panel, and basic metabolic panel 12. Care Plan discussed smoking cessation. Check on shingles vaccine but no coverage. Yearly flu vaccine.  Past Medical History  Diagnosis Date  . Hx of colonic polyps   . Diverticulitis   . Hypertension   . Osteopenia   . Hearing loss   . Kidney stones   . Hyperlipidemia    No past surgical history on file.  reports that he has been smoking Cigarettes.  He has a 44 pack-year smoking history. He does not have any smokeless tobacco history on file. He reports that he drinks alcohol. His drug history not on file. family history includes Cancer in his mother; Diabetes  in his mother and unspecified family member; and Hypertension in his father. No Known Allergies     Review of Systems  Constitutional: Negative for fever, activity change, appetite change and fatigue.  HENT: Negative for ear pain, congestion and trouble swallowing.   Eyes: Negative for pain and visual disturbance.  Respiratory: Negative for cough, shortness of breath and wheezing.   Cardiovascular: Negative for chest pain and palpitations.  Gastrointestinal: Negative for nausea, vomiting, abdominal pain, diarrhea, constipation, blood in stool, abdominal distention and rectal pain.  Genitourinary: Negative for dysuria, hematuria and testicular pain.  Musculoskeletal: Negative for joint swelling and arthralgias.  Skin: Negative for rash.  Neurological: Negative for dizziness, syncope and headaches.  Hematological: Negative for adenopathy.  Psychiatric/Behavioral: Negative for confusion and dysphoric mood.       Objective:   Physical Exam  Constitutional: He is oriented to person, place, and time. He appears well-developed and well-nourished. No distress.  HENT:  Head: Normocephalic and atraumatic.  Right Ear: External ear normal.  Left Ear: External ear normal.  Mouth/Throat: Oropharynx is clear and moist.  Eyes: Conjunctivae and EOM are normal. Pupils are equal, round, and reactive to light.  Neck: Normal range of motion. Neck supple. No thyromegaly present.  Cardiovascular: Normal rate, regular rhythm and normal heart sounds.   No murmur heard. Pulmonary/Chest: No respiratory distress. He has no wheezes. He has no rales.  Abdominal: Soft. Bowel sounds are normal. He exhibits no distension and no mass. There is no tenderness. There is no rebound and no guarding.  Musculoskeletal: He exhibits no edema.  Lymphadenopathy:  He has no cervical adenopathy.  Neurological: He is alert and oriented to person, place, and time. He displays normal reflexes. No cranial nerve deficit.    Skin: No rash noted.       Patient has multiple seborrheic keratoses on his trunk which are unchanged  Psychiatric: He has a normal mood and affect. His behavior is normal. Thought content normal.          Assessment & Plan:  #1 health maintenance. Reminder for yearly flu vaccine. Colonoscopy up to date. Pneumovax and tetanus up-to-date. No coverage for shingles vaccine. Smoking cessation discussed. #2 hypertension stable continue lisinopril HCTZ

## 2010-09-10 NOTE — Patient Instructions (Signed)
Remember flu vaccine this fall. 

## 2010-09-11 NOTE — Progress Notes (Signed)
Quick Note:  Pt informed on home VM ______ 

## 2010-11-25 ENCOUNTER — Ambulatory Visit (INDEPENDENT_AMBULATORY_CARE_PROVIDER_SITE_OTHER): Payer: Medicare Other | Admitting: *Deleted

## 2010-11-25 DIAGNOSIS — Z23 Encounter for immunization: Secondary | ICD-10-CM

## 2011-03-31 ENCOUNTER — Ambulatory Visit: Payer: Medicare Other | Admitting: Family Medicine

## 2011-08-16 ENCOUNTER — Telehealth: Payer: Self-pay | Admitting: Family Medicine

## 2011-08-16 ENCOUNTER — Other Ambulatory Visit: Payer: Self-pay | Admitting: Family Medicine

## 2011-08-16 MED ORDER — LISINOPRIL-HYDROCHLOROTHIAZIDE 10-12.5 MG PO TABS: 1.0000 | ORAL_TABLET | Freq: Every day | ORAL | Status: DC

## 2011-08-16 NOTE — Telephone Encounter (Signed)
Pts spouse called and said that pts lisinopril-hydrochlorothiazide (PRINZIDE,ZESTORETIC) 10-12.5 MG per tablet is needing to be renewed to [facility], maintenance (IT)) Phone # [phone]. Pls call in asap.

## 2011-10-04 ENCOUNTER — Encounter: Payer: Self-pay | Admitting: Family Medicine

## 2011-10-04 ENCOUNTER — Ambulatory Visit (INDEPENDENT_AMBULATORY_CARE_PROVIDER_SITE_OTHER): Payer: Medicare Other | Admitting: Family Medicine

## 2011-10-04 VITALS — BP 102/60 | HR 60 | Temp 97.8°F | Resp 12 | Ht 74.0 in | Wt 183.0 lb

## 2011-10-04 DIAGNOSIS — Z23 Encounter for immunization: Secondary | ICD-10-CM

## 2011-10-04 DIAGNOSIS — Z0000 Encounter for general adult medical examination without abnormal findings: Secondary | ICD-10-CM | POA: Diagnosis not present

## 2011-10-04 DIAGNOSIS — Z8679 Personal history of other diseases of the circulatory system: Secondary | ICD-10-CM | POA: Diagnosis not present

## 2011-10-04 DIAGNOSIS — E785 Hyperlipidemia, unspecified: Secondary | ICD-10-CM | POA: Diagnosis not present

## 2011-10-04 DIAGNOSIS — I10 Essential (primary) hypertension: Secondary | ICD-10-CM

## 2011-10-04 NOTE — Progress Notes (Signed)
Subjective:    Patient ID: James Marks, male    DOB: 02/20/1940, 71 y.o.   MRN: [id number]  HPI  Patient seen for medical followup and Medicare wellness exam. He has history of dyslipidemia and hypertension. Blood pressures been well controlled. Compliant with medication. Denies side effects. He has history of benign colon polyps. Colonoscopy 2012. No history of shingles vaccine. Pneumovax up-to-date. Requesting flu vaccine today. Still smoking. Has scaled back greatly usually only 6-7 cigarettes per day. Nontraumatic subarachnoid hemorrhage 2/01.  No recent headaches.  Past Medical History  Diagnosis Date  . Hx of colonic polyps   . Diverticulitis   . Hypertension   . Osteopenia   . Hearing loss   . Kidney stones   . Hyperlipidemia    No past surgical history on file.  reports that he has been smoking Cigarettes.  He has a 44 pack-year smoking history. He does not have any smokeless tobacco history on file. He reports that he drinks alcohol. His drug history not on file. family history includes Cancer in his mother; Diabetes in his mother and unspecified family member; and Hypertension in his father. No Known Allergies  1.  Risk factors based on Past Medical , Social, and Family history reviewed and as indicated above 2.  Limitations in physical activities stays active with golf. Low risk for fall 3.  Depression/mood no anxiety or depression issues 4.  Hearing chronic bilateral sensorineural hearing loss. Bilateral hearing aids which are working well 5.  ADLs fully independent in all 6.  Cognitive function (orientation to time and place, language, writing, speech,memory) no memory deficits. No language or speech deficits. Judgment intact 7.  Home Safety no issues identified 8.  Height, weight, and visual acuity. Height and weight stable. No recent visual changes. 9.  Counseling smoking cessation discussed 10. Recommendation of preventive services. Patient had recent [facility]  screening for abdominal aneurysm 2 years ago normal. Flu vaccine recommended. Pneumovax up-to-date. Colonoscopy up to date 11. Labs based on risk factors lipid, hepatic, and basic metabolic panel 12. Care Plan as above.   Review of Systems  Constitutional: Negative for fever, activity change, appetite change, fatigue and unexpected weight change.  HENT: Negative for ear pain, congestion and trouble swallowing.   Eyes: Negative for pain and visual disturbance.  Respiratory: Negative for cough, shortness of breath and wheezing.   Cardiovascular: Negative for chest pain and palpitations.  Gastrointestinal: Negative for nausea, vomiting, abdominal pain, diarrhea, constipation, blood in stool, abdominal distention and rectal pain.  Genitourinary: Negative for dysuria, hematuria and testicular pain.  Musculoskeletal: Negative for joint swelling and arthralgias.  Skin: Negative for rash.  Neurological: Negative for dizziness, syncope and headaches.  Hematological: Negative for adenopathy.  Psychiatric/Behavioral: Negative for confusion and dysphoric mood.       Objective:   Physical Exam  Constitutional: He is oriented to person, place, and time. He appears well-developed and well-nourished. No distress.  HENT:  Head: Normocephalic and atraumatic.  Right Ear: External ear normal.  Left Ear: External ear normal.  Mouth/Throat: Oropharynx is clear and moist.  Eyes: Conjunctivae normal and EOM are normal. Pupils are equal, round, and reactive to light.  Neck: Normal range of motion. Neck supple. No thyromegaly present.  Cardiovascular: Normal rate, regular rhythm and normal heart sounds.   No murmur heard. Pulmonary/Chest: No respiratory distress. He has no wheezes. He has no rales.  Abdominal: Soft. Bowel sounds are normal. He exhibits no distension and no mass. There is no  tenderness. There is no rebound and no guarding.  Musculoskeletal: He exhibits no edema.  Lymphadenopathy:    He has  no cervical adenopathy.  Neurological: He is alert and oriented to person, place, and time. He displays normal reflexes. No cranial nerve deficit.  Skin: No rash noted.  Psychiatric: He has a normal mood and affect.          Assessment & Plan:  #1 health maintenance. Flu vaccine given. Check on coverage for shingles vaccine. Patient strongly encouraged to stop smoking altogether  #2 hypertension stable continue current medication. Check basic metabolic panel #3 history of dyslipidemia. Check lipid panel

## 2011-10-04 NOTE — Patient Instructions (Addendum)
Smoking Cessation This document explains the best ways for you to quit smoking and new treatments to help. It lists new medicines that can double or triple your chances of quitting and quitting for good. It also considers ways to avoid relapses and concerns you may have about quitting, including weight gain. NICOTINE: A POWERFUL ADDICTION If you have tried to quit smoking, you know how hard it can be. It is hard because nicotine is a very addictive drug. For some people, it can be as addictive as heroin or cocaine. Usually, people make 2 or 3 tries, or more, before finally being able to quit. Each time you try to quit, you can learn about what helps and what hurts. Quitting takes hard work and a lot of effort, but you can quit smoking. QUITTING SMOKING IS ONE OF THE MOST IMPORTANT THINGS YOU WILL EVER DO.  You will live longer, feel better, and live better.   The impact on your body of quitting smoking is felt almost immediately:   Within 20 minutes, blood pressure decreases. Pulse returns to its normal level.   After 8 hours, carbon monoxide levels in the blood return to normal. Oxygen level increases.   After 24 hours, chance of heart attack starts to decrease. Breath, hair, and body stop smelling like smoke.   After 48 hours, damaged nerve endings begin to recover. Sense of taste and smell improve.   After 72 hours, the body is virtually free of nicotine. Bronchial tubes relax and breathing becomes easier.   After 2 to 12 weeks, lungs can hold more air. Exercise becomes easier and circulation improves.   Quitting will reduce your risk of having a heart attack, stroke, cancer, or lung disease:   After 1 year, the risk of coronary heart disease is cut in half.   After 5 years, the risk of stroke falls to the same as a nonsmoker.   After 10 years, the risk of lung cancer is cut in half and the risk of other cancers decreases significantly.   After 15 years, the risk of coronary heart  disease drops, usually to the level of a nonsmoker.   If you are pregnant, quitting smoking will improve your chances of having a healthy baby.   The people you live with, especially your children, will be healthier.   You will have extra money to spend on things other than cigarettes.  FIVE KEYS TO QUITTING Studies have shown that these 5 steps will help you quit smoking and quit for good. You have the best chances of quitting if you use them together: 1. Get ready.  2. Get support and encouragement.  3. Learn new skills and behaviors.  4. Get medicine to reduce your nicotine addiction and use it correctly.  5. Be prepared for relapse or difficult situations. Be determined to continue trying to quit, even if you do not succeed at first.  1. GET READY  Set a quit date.   Change your environment.   Get rid of ALL cigarettes, ashtrays, matches, and lighters in your home, car, and place of work.   Do not let people smoke in your home.   Review your past attempts to quit. Think about what worked and what did not.   Once you quit, do not smoke. NOT EVEN A PUFF!  2. GET SUPPORT AND ENCOURAGEMENT Studies have shown that you have a better chance of being successful if you have help. You can get support in many ways.  Tell   your family, friends, and coworkers that you are going to quit and need their support. Ask them not to smoke around you.   Talk to your caregivers (doctor, dentist, nurse, pharmacist, psychologist, and/or smoking counselor).   Get individual, group, or telephone counseling and support. The more counseling you have, the better your chances are of quitting. Programs are available at local hospitals and health centers. Call your local health department for information about programs in your area.   Spiritual beliefs and practices may help some smokers quit.   Quit meters are small computer programs online or downloadable that keep track of quit statistics, such as amount  of "quit-time," cigarettes not smoked, and money saved.   Many smokers find one or more of the many self-help books available useful in helping them quit and stay off tobacco.  3. LEARN NEW SKILLS AND BEHAVIORS  Try to distract yourself from urges to smoke. Talk to someone, go for a walk, or occupy your time with a task.   When you first try to quit, change your routine. Take a different route to work. Drink tea instead of coffee. Eat breakfast in a different place.   Do something to reduce your stress. Take a hot bath, exercise, or read a book.   Plan something enjoyable to do every day. Reward yourself for not smoking.   Explore interactive web-based programs that specialize in helping you quit.  4. GET MEDICINE AND USE IT CORRECTLY Medicines can help you stop smoking and decrease the urge to smoke. Combining medicine with the above behavioral methods and support can quadruple your chances of successfully quitting smoking. The U.[place]. Food and Drug Administration (FDA) has approved 7 medicines to help you quit smoking. These medicines fall into 3 categories.  Nicotine replacement therapy (delivers nicotine to your body without the negative effects and risks of smoking):   Nicotine gum: Available over-the-counter.   Nicotine lozenges: Available over-the-counter.   Nicotine inhaler: Available by prescription.   Nicotine nasal spray: Available by prescription.   Nicotine skin patches (transdermal): Available by prescription and over-the-counter.   Antidepressant medicine (helps people abstain from smoking, but how this works is unknown):   Bupropion sustained-release (SR) tablets: Available by prescription.   Nicotinic receptor partial agonist (simulates the effect of nicotine in your brain):   Varenicline tartrate tablets: Available by prescription.   Ask your caregiver for advice about which medicines to use and how to use them. Carefully read the information on the package.    Everyone who is trying to quit may benefit from using a medicine. If you are pregnant or trying to become pregnant, nursing an infant, you are under age 18, or you smoke fewer than 10 cigarettes per day, talk to your caregiver before taking any nicotine replacement medicines.   You should stop using a nicotine replacement product and call your caregiver if you experience nausea, dizziness, weakness, vomiting, fast or irregular heartbeat, mouth problems with the lozenge or gum, or redness or swelling of the skin around the patch that does not go away.   Do not use any other product containing nicotine while using a nicotine replacement product.   Talk to your caregiver before using these products if you have diabetes, heart disease, asthma, stomach ulcers, you had a recent heart attack, you have high blood pressure that is not controlled with medicine, a history of irregular heartbeat, or you have been prescribed medicine to help you quit smoking.  5. BE PREPARED FOR RELAPSE OR   DIFFICULT SITUATIONS  Most relapses occur within the first 3 months after quitting. Do not be discouraged if you start smoking again. Remember, most people try several times before they finally quit.   You may have symptoms of withdrawal because your body is used to nicotine. You may crave cigarettes, be irritable, feel very hungry, cough often, get headaches, or have difficulty concentrating.   The withdrawal symptoms are only temporary. They are strongest when you first quit, but they will go away within 10 to 14 days.  Here are some difficult situations to watch for:  Alcohol. Avoid drinking alcohol. Drinking lowers your chances of successfully quitting.   Caffeine. Try to reduce the amount of caffeine you consume. It also lowers your chances of successfully quitting.   Other smokers. Being around smoking can make you want to smoke. Avoid smokers.   Weight gain. Many smokers will gain weight when they quit, usually  less than 10 pounds. Eat a healthy diet and stay active. Do not let weight gain distract you from your main goal, quitting smoking. Some medicines that help you quit smoking may also help delay weight gain. You can always lose the weight gained after you quit.   Bad mood or depression. There are a lot of ways to improve your mood other than smoking.  If you are having problems with any of these situations, talk to your caregiver. SPECIAL SITUATIONS AND CONDITIONS Studies suggest that everyone can quit smoking. Your situation or condition can give you a special reason to quit.  Pregnant women/new mothers: By quitting, you protect your baby's health and your own.   Hospitalized patients: By quitting, you reduce health problems and help healing.   Heart attack patients: By quitting, you reduce your risk of a second heart attack.   Lung, head, and neck cancer patients: By quitting, you reduce your chance of a second cancer.   Parents of children and adolescents: By quitting, you protect your children from illnesses caused by secondhand smoke.  QUESTIONS TO THINK ABOUT Think about the following questions before you try to stop smoking. You may want to talk about your answers with your caregiver.  Why do you want to quit?   If you tried to quit in the past, what helped and what did not?   What will be the most difficult situations for you after you quit? How will you plan to handle them?   Who can help you through the tough times? Your family? Friends? Caregiver?   What pleasures do you get from smoking? What ways can you still get pleasure if you quit?  Here are some questions to ask your caregiver:  How can you help me to be successful at quitting?   What medicine do you think would be best for me and how should I take it?   What should I do if I need more help?   What is smoking withdrawal like? How can I get information on withdrawal?  Quitting takes hard work and a lot of effort,  but you can quit smoking. FOR MORE INFORMATION  Smokefree.gov (http://www.smokefree.gov) provides free, accurate, evidence-based information and professional assistance to help support the immediate and long-term needs of people trying to quit smoking. Document Released: 12/22/2000 Document Revised: 12/17/2010 Document Reviewed: 10/14/2008 Exit[employer] Patient Information 2012 [employer]. 

## 2011-10-05 LAB — HEPATIC FUNCTION PANEL
ALT: 14 U/L (ref 0–53)
AST: 17 U/L (ref 0–37)
Albumin: 3.7 g/dL (ref 3.5–5.2)
Alkaline Phosphatase: 94 U/L (ref 39–117)
Total Bilirubin: 0.4 mg/dL (ref 0.3–1.2)
Total Protein: 7.5 g/dL (ref 6.0–8.3)

## 2011-10-05 LAB — BASIC METABOLIC PANEL
Calcium: 9.1 mg/dL (ref 8.4–10.5)
Creatinine, Ser: 1.6 mg/dL — ABNORMAL HIGH (ref 0.4–1.5)
GFR: 44.98 mL/min — ABNORMAL LOW (ref >60.00)
Glucose, Bld: 102 mg/dL — ABNORMAL HIGH (ref 70–99)
Sodium: 140 mEq/L (ref 135–145)

## 2011-10-05 LAB — LIPID PANEL: HDL: 40 mg/dL (ref >39.00)

## 2011-10-06 NOTE — Progress Notes (Signed)
Quick Note:  Pt wife informed ______ 

## 2012-06-08 ENCOUNTER — Other Ambulatory Visit: Payer: Self-pay | Admitting: Family Medicine

## 2012-10-04 ENCOUNTER — Encounter: Payer: Self-pay | Admitting: Family Medicine

## 2012-10-04 ENCOUNTER — Ambulatory Visit (INDEPENDENT_AMBULATORY_CARE_PROVIDER_SITE_OTHER): Payer: Medicare Other | Admitting: Family Medicine

## 2012-10-04 VITALS — BP 100/62 | HR 62 | Temp 97.6°F | Ht 74.0 in | Wt 185.0 lb

## 2012-10-04 DIAGNOSIS — I10 Essential (primary) hypertension: Secondary | ICD-10-CM

## 2012-10-04 DIAGNOSIS — Z23 Encounter for immunization: Secondary | ICD-10-CM

## 2012-10-04 DIAGNOSIS — E785 Hyperlipidemia, unspecified: Secondary | ICD-10-CM

## 2012-10-04 DIAGNOSIS — Z0000 Encounter for general adult medical examination without abnormal findings: Secondary | ICD-10-CM

## 2012-10-04 DIAGNOSIS — N189 Chronic kidney disease, unspecified: Secondary | ICD-10-CM

## 2012-10-04 DIAGNOSIS — I129 Hypertensive chronic kidney disease with stage 1 through stage 4 chronic kidney disease, or unspecified chronic kidney disease: Secondary | ICD-10-CM | POA: Diagnosis not present

## 2012-10-04 LAB — LIPID PANEL
Cholesterol: 153 mg/dL (ref 0–200)
LDL Cholesterol: 97 mg/dL (ref 0–99)
Triglycerides: 85 mg/dL (ref 0.0–149.0)
VLDL: 17 mg/dL (ref 0.0–40.0)

## 2012-10-04 LAB — BASIC METABOLIC PANEL
Chloride: 105 mEq/L (ref 96–112)
Potassium: 4.1 mEq/L (ref 3.5–5.1)

## 2012-10-04 MED ORDER — LISINOPRIL-HYDROCHLOROTHIAZIDE 10-12.5 MG PO TABS: 1.0000 | ORAL_TABLET | Freq: Every day | ORAL | Status: DC

## 2012-10-04 NOTE — Progress Notes (Signed)
Subjective:    Patient ID: James Marks, male    DOB: 21-Jan-1940, 72 y.o.   MRN: [id number]  HPI Patient seen for Medicare wellness exam and medical followup He has hypertension which is treated with lisinopril HCTZ. Chronic kidney disease with baseline creatinine around 1.6. Compliant with medication. Blood pressures been well controlled. No recent dizziness. He has quit cigarette use but is using electronic cigarettes and trying to scale back.  Needs flu vaccine. Pneumovax up-to-date. He has declined shingles vaccine. Colonoscopy up-to-date.  Past Medical History  Diagnosis Date  . Hx of colonic polyps   . Diverticulitis   . Hypertension   . Osteopenia   . Hearing loss   . Kidney stones   . Hyperlipidemia   . Subarachnoid hemorrhage 2/01    nontraumatic   No past surgical history on file.  reports that he has been smoking Cigarettes.  He has a 44 pack-year smoking history. He does not have any smokeless tobacco history on file. He reports that  drinks alcohol. His drug history is not on file. family history includes Cancer in his mother; Diabetes in his mother and another family member; Hypertension in his father. No Known Allergies  1.  Risk factors based on Past Medical , Social, and Family history reviewed and as indicated above 2.  Limitations in physical activities stays active with golf. No recent falls 3.  Depression/mood no depression or anxiety issues 4.  Hearing chronic bilateral hearing loss with bilateral hearing aids 5.  ADLs fully independent 6.  Cognitive function (orientation to time and place, language, writing, speech,memory) no memory deficits. Speech and language intact 7.  Home Safety no issues 8.  Height, weight, and visual acuity. No recent visual changes. Height and weight are stable 9.  Counseling discussed nicotine cessation 10. Recommendation of preventive services. Flu vaccine given. He declined shingles vaccine 11. Labs based on risk factors  basic metabolic panel and lipid panel 12. Care Plan as above    Review of Systems  Constitutional: Negative for fever, chills, activity change, appetite change, fatigue and unexpected weight change.  HENT: Positive for hearing loss (chronic). Negative for ear pain, congestion and trouble swallowing.   Eyes: Negative for pain and visual disturbance.  Respiratory: Negative for cough, shortness of breath and wheezing.   Cardiovascular: Negative for chest pain and palpitations.  Gastrointestinal: Negative for nausea, vomiting, abdominal pain, diarrhea, constipation, blood in stool, abdominal distention and rectal pain.  Endocrine: Negative for polydipsia and polyuria.  Genitourinary: Negative for dysuria, hematuria and testicular pain.  Musculoskeletal: Negative for joint swelling and arthralgias.  Skin: Negative for rash.  Neurological: Negative for dizziness, syncope and headaches.  Hematological: Negative for adenopathy.  Psychiatric/Behavioral: Negative for confusion and dysphoric mood.       Objective:   Physical Exam  Constitutional: He is oriented to person, place, and time. He appears well-developed and well-nourished. No distress.  HENT:  Head: Normocephalic and atraumatic.  Right Ear: External ear normal.  Left Ear: External ear normal.  Mouth/Throat: Oropharynx is clear and moist.  Eyes: Conjunctivae and EOM are normal. Pupils are equal, round, and reactive to light.  Neck: Normal range of motion. Neck supple. No thyromegaly present.  Cardiovascular: Normal rate, regular rhythm and normal heart sounds.   No murmur heard. Pulmonary/Chest: No respiratory distress. He has no wheezes. He has no rales.  Abdominal: Soft. Bowel sounds are normal. He exhibits no distension and no mass. There is no tenderness. There is no rebound  and no guarding.  Musculoskeletal: He exhibits no edema.  Lymphadenopathy:    He has no cervical adenopathy.  Neurological: He is alert and oriented to  person, place, and time. He displays normal reflexes. No cranial nerve deficit.  Skin: No rash noted.  Multiple scattered seborrheic keratoses. No concerning lesions noted  Psychiatric: He has a normal mood and affect.          Assessment & Plan:  #1 complete physical. Flu vaccine given. He declined shingles vaccine. Pneumovax up-to-date. Colonoscopy up to date. #2 hypertension. Well controlled. Continue lisinopril HCTZ #3 history of chronic kidney disease. Repeat basic metabolic panel

## 2012-12-27 DIAGNOSIS — H2510 Age-related nuclear cataract, unspecified eye: Secondary | ICD-10-CM | POA: Diagnosis not present

## 2013-04-19 ENCOUNTER — Telehealth: Payer: Self-pay | Admitting: Family Medicine

## 2013-04-19 MED ORDER — LISINOPRIL-HYDROCHLOROTHIAZIDE 10-12.5 MG PO TABS: 1.0000 | ORAL_TABLET | Freq: Every day | ORAL | Status: DC

## 2013-04-19 NOTE — Telephone Encounter (Signed)
RX sent to [facility]

## 2013-04-19 NOTE — Telephone Encounter (Signed)
[facility] - [place], [place] - [address] AT [address] is requesting re-fill on lisinopril-hydrochlorothiazide (PRINZIDE,ZESTORETIC) 10-12.5 MG per tablet °

## 2013-10-05 ENCOUNTER — Encounter: Payer: Self-pay | Admitting: Family Medicine

## 2013-10-05 ENCOUNTER — Ambulatory Visit (INDEPENDENT_AMBULATORY_CARE_PROVIDER_SITE_OTHER): Payer: Medicare Other | Admitting: Family Medicine

## 2013-10-05 VITALS — BP 106/68 | HR 61 | Temp 97.4°F | Ht 74.0 in | Wt 176.0 lb

## 2013-10-05 DIAGNOSIS — Z0000 Encounter for general adult medical examination without abnormal findings: Secondary | ICD-10-CM | POA: Diagnosis not present

## 2013-10-05 DIAGNOSIS — Z23 Encounter for immunization: Secondary | ICD-10-CM

## 2013-10-05 DIAGNOSIS — N183 Chronic kidney disease, stage 3 unspecified: Secondary | ICD-10-CM | POA: Diagnosis not present

## 2013-10-05 DIAGNOSIS — N182 Chronic kidney disease, stage 2 (mild): Secondary | ICD-10-CM

## 2013-10-05 LAB — BASIC METABOLIC PANEL
BUN: 29 mg/dL — ABNORMAL HIGH (ref 6–23)
CHLORIDE: 104 meq/L (ref 96–112)
CO2: 27 meq/L (ref 19–32)
Calcium: 9.3 mg/dL (ref 8.4–10.5)
Creatinine, Ser: 1.8 mg/dL — ABNORMAL HIGH (ref 0.4–1.5)
GFR: 39.58 mL/min — ABNORMAL LOW (ref >60.00)
GLUCOSE: 107 mg/dL — AB (ref 70–99)
POTASSIUM: 5.1 meq/L (ref 3.5–5.1)
SODIUM: 141 meq/L (ref 135–145)

## 2013-10-05 NOTE — Patient Instructions (Signed)
Continue yearly flu vaccine Consider Prevnar 13-which is a one-time pneumonia vaccine booster.  This guideline came out last fall Consider shingles vaccine Consider CT chest low dose screening for lung cancer-check with insurance for coverage

## 2013-10-05 NOTE — Progress Notes (Signed)
Pre visit review using our clinic review tool, if applicable. No additional management support is needed unless otherwise documented below in the visit note. 

## 2013-10-05 NOTE — Progress Notes (Signed)
Subjective:    Patient ID: James Marks, male    DOB: [birth year], 73 y.o.   MRN: [id number]  HPI Patient here for Medicare wellness exam. He has history of hypertension treated with lisinopril HCTZ. He has chronic kidney disease stage III which has been stable for several years. He quit smoking a year ago. Colonoscopy up to date. He declines shingles vaccine and Prevnar 13. Needs flu vaccine. Tetanus up to date. He has no specific complaints this time. Good appetite. He's had some gradual weight loss over several years was been very slow and gradual. He thinks this is due to loss of lean body mass.  Past Medical History  Diagnosis Date  . Hx of colonic polyps   . Diverticulitis   . Hypertension   . Osteopenia   . Hearing loss   . Kidney stones   . Hyperlipidemia   . Subarachnoid hemorrhage 2/01    nontraumatic   No past surgical history on file.  reports that he has been smoking Cigarettes.  He has a 44 pack-year smoking history. He does not have any smokeless tobacco history on file. He reports that he drinks alcohol. His drug history is not on file. family history includes Cancer in his mother; Diabetes in his mother and another family member; Hypertension in his father. No Known Allergies  1.  Risk factors based on Past Medical , Social, and Family history reviewed and as indicated above with no changes 2.  Limitations in physical activities None.  No recent falls. 3.  Depression/mood No active depression or anxiety issues 4.  Hearing bilateral hearing loss. He has bilateral hearing aids but only one in place today 5.  ADLs independent in all. 6.  Cognitive function (orientation to time and place, language, writing, speech,memory) no short or long term memory issues.  Language and judgement intact. 7.  Home Safety no issues 8.  Height, weight, and visual acuity.mild gradual weight loss over several years 9.  Counseling discussed discussed the importance of regular  weightbearing exercise 10. Recommendation of preventive services. Flu vaccine given. We recommend a Prevnar 13 and consider shingles vaccine and he declines both. We discussed possible CT chest low dose screening for lung cancer to check on insurance coverage 11. Labs based on risk factors basic metabolic panel 12. Care Plan as above 13. Other Providers none regularly 14. Written schedule of screening/prevention services given to patient.    Review of Systems  Constitutional: Negative for fever, activity change, appetite change and fatigue.  HENT: Positive for hearing loss (chronic bilateral). Negative for congestion, ear pain and trouble swallowing.   Eyes: Negative for pain and visual disturbance.  Respiratory: Positive for cough (past couple of weeks but nonproductive. No hemoptysis. No dyspnea). Negative for shortness of breath and wheezing.   Cardiovascular: Negative for chest pain and palpitations.  Gastrointestinal: Negative for nausea, vomiting, abdominal pain, diarrhea, constipation, blood in stool, abdominal distention and rectal pain.  Endocrine: Negative for polydipsia and polyuria.  Genitourinary: Negative for dysuria, hematuria and testicular pain.  Musculoskeletal: Negative for arthralgias and joint swelling.  Skin: Negative for rash.  Neurological: Negative for dizziness, syncope and headaches.  Hematological: Negative for adenopathy.  Psychiatric/Behavioral: Negative for confusion and dysphoric mood.       Objective:   Physical Exam  Constitutional: He is oriented to person, place, and time. He appears well-developed and well-nourished.  HENT:  Head: Normocephalic and atraumatic.  Right Ear: External ear normal.  Left Ear: External ear  normal.  Mouth/Throat: Oropharynx is clear and moist.  Neck: Neck supple. No thyromegaly present.  Cardiovascular: Normal rate and regular rhythm.  Exam reveals no gallop.   Pulmonary/Chest: Effort normal and breath sounds normal.  No respiratory distress. He has no wheezes.  Abdominal: Soft. Bowel sounds are normal. He exhibits no distension and no mass. There is no tenderness. There is no rebound and no guarding.  Musculoskeletal: He exhibits no edema.  Lymphadenopathy:    He has no cervical adenopathy.  Neurological: He is alert and oriented to person, place, and time. No cranial nerve deficit.  Skin:  Multiple seborrheic keratoses, especially over his trunk. No concerning lesions noted.  Psychiatric: He has a normal mood and affect. His behavior is normal.          Assessment & Plan:  Wellness visit. Flu vaccine given. We'll encouraged Prevnar 60 and consider shingles vaccine and declines both. Colonoscopy up to date. Tetanus up-to-date. Check basic metabolic panel with chronic kidney disease history

## 2013-11-12 ENCOUNTER — Telehealth: Payer: Self-pay | Admitting: Family Medicine

## 2013-11-12 MED ORDER — LISINOPRIL-HYDROCHLOROTHIAZIDE 10-12.5 MG PO TABS: 1.0000 | ORAL_TABLET | Freq: Every day | ORAL | Status: DC

## 2013-11-12 NOTE — Telephone Encounter (Signed)
[facility] [place] is requesting re-fill on lisinopril-hydrochlorothiazide (PRINZIDE,ZESTORETIC) 10-12.5 MG per tablet

## 2013-11-12 NOTE — Telephone Encounter (Signed)
Rx sent to mail order

## 2013-12-09 ENCOUNTER — Other Ambulatory Visit: Payer: Self-pay | Admitting: Family Medicine

## 2014-03-29 ENCOUNTER — Encounter: Payer: Self-pay | Admitting: Family Medicine

## 2014-03-29 ENCOUNTER — Other Ambulatory Visit: Payer: Self-pay | Admitting: Family Medicine

## 2014-03-29 ENCOUNTER — Ambulatory Visit (INDEPENDENT_AMBULATORY_CARE_PROVIDER_SITE_OTHER): Payer: Medicare Other | Admitting: Family Medicine

## 2014-03-29 VITALS — BP 120/70 | HR 53 | Temp 97.4°F | Wt 177.0 lb

## 2014-03-29 DIAGNOSIS — E039 Hypothyroidism, unspecified: Secondary | ICD-10-CM | POA: Diagnosis not present

## 2014-03-29 DIAGNOSIS — I10 Essential (primary) hypertension: Secondary | ICD-10-CM | POA: Diagnosis not present

## 2014-03-29 DIAGNOSIS — G629 Polyneuropathy, unspecified: Secondary | ICD-10-CM

## 2014-03-29 DIAGNOSIS — E1165 Type 2 diabetes mellitus with hyperglycemia: Secondary | ICD-10-CM | POA: Diagnosis not present

## 2014-03-29 NOTE — Progress Notes (Signed)
Pre visit review using our clinic review tool, if applicable. No additional management support is needed unless otherwise documented below in the visit note. 

## 2014-03-30 LAB — BASIC METABOLIC PANEL
BUN: 27 mg/dL — ABNORMAL HIGH (ref 6–23)
CO2: 28 meq/L (ref 19–32)
Calcium: 9.5 mg/dL (ref 8.4–10.5)
Chloride: 105 mEq/L (ref 96–112)
Creat: 1.62 mg/dL — ABNORMAL HIGH (ref 0.50–1.35)
Glucose, Bld: 98 mg/dL (ref 70–99)
Potassium: 5.6 mEq/L — ABNORMAL HIGH (ref 3.5–5.3)
SODIUM: 142 meq/L (ref 135–145)

## 2014-03-30 LAB — TSH: TSH: 0.763 u[IU]/mL (ref 0.350–4.500)

## 2014-03-30 LAB — HEMOGLOBIN A1C
Hgb A1c MFr Bld: 6 % — ABNORMAL HIGH (ref <5.7)
Mean Plasma Glucose: 126 mg/dL — ABNORMAL HIGH (ref <117)

## 2014-03-30 LAB — VITAMIN B12: Vitamin B-12: 312 pg/mL (ref 211–911)

## 2014-03-30 NOTE — Progress Notes (Signed)
   Subjective:    Patient ID: James Marks, male    DOB: 10/18/1940, 74 y.o.   MRN: [id number]  HPI Pt seen with 3-4 month history of bilateral foot numbness and occasional foot pain.  No lumbar back pain.  No weakness.  No loss of bladder or bowel control.  Achy pain in feet with prolonged standing. No claudication symptoms.  No upper extremity symptoms.  Toe numbness is especially bothersome at night..  No hx of diabetes but has had sugars in the prediabetes range.  No polyuria or polydipsia.   No recent appetite or weight changes.  No claudication symptoms.  Recently quit smoking.  Past Medical History  Diagnosis Date  . Hx of colonic polyps   . Diverticulitis   . Hypertension   . Osteopenia   . Hearing loss   . Kidney stones   . Hyperlipidemia   . Subarachnoid hemorrhage 2/01    nontraumatic   No past surgical history on file.  reports that he has quit smoking. His smoking use included Cigarettes. He has a 44 pack-year smoking history. He quit smokeless tobacco use about 17 months ago. He reports that he drinks alcohol. His drug history is not on file. family history includes Cancer in his mother; Diabetes in his mother and another family member; Hypertension in his father. No Known Allergies    Review of Systems  Constitutional: Negative for fever, chills, appetite change and unexpected weight change.  Respiratory: Negative for cough.   Cardiovascular: Negative for chest pain.  Gastrointestinal: Negative for abdominal pain.  Endocrine: Negative for polydipsia and polyuria.  Musculoskeletal: Negative for back pain.  Neurological: Positive for numbness. Negative for weakness.       Objective:   Physical Exam  Constitutional: He appears well-developed and well-nourished.  Cardiovascular: Normal rate and regular rhythm.   Pulmonary/Chest: Effort normal and breath sounds normal. No respiratory distress. He has no wheezes. He has no rales.  Musculoskeletal: He exhibits no  edema.  Both feet warm with good DP and PT pulses. No edema.  Neurological:  Full strength LEs .  DTRs are 1+ throughout.            Assessment & Plan:  Bilateral symmetric sensory neuropathy.  Check A1C, TSH, B12, BMP, SPEP.

## 2014-04-03 ENCOUNTER — Other Ambulatory Visit (INDEPENDENT_AMBULATORY_CARE_PROVIDER_SITE_OTHER): Payer: Medicare Other

## 2014-04-03 ENCOUNTER — Other Ambulatory Visit: Payer: Self-pay | Admitting: Family Medicine

## 2014-04-03 DIAGNOSIS — G609 Hereditary and idiopathic neuropathy, unspecified: Secondary | ICD-10-CM | POA: Diagnosis not present

## 2014-04-03 DIAGNOSIS — G629 Polyneuropathy, unspecified: Secondary | ICD-10-CM

## 2014-04-05 LAB — PROTEIN ELECTROPHORESIS, SERUM
ALBUMIN ELP: 3.8 g/dL (ref 3.8–4.8)
ALPHA-2-GLOBULIN: 0.6 g/dL (ref 0.5–0.9)
Alpha-1-Globulin: 0.3 g/dL (ref 0.2–0.3)
BETA 2: 0.9 g/dL — AB (ref 0.2–0.5)
BETA GLOBULIN: 0.5 g/dL (ref 0.4–0.6)
Gamma Globulin: 1.2 g/dL (ref 0.8–1.7)
Total Protein, Serum Electrophoresis: 7.3 g/dL (ref 6.1–8.1)

## 2014-04-29 ENCOUNTER — Telehealth: Payer: Self-pay | Admitting: Family Medicine

## 2014-04-29 ENCOUNTER — Encounter: Payer: Self-pay | Admitting: Family Medicine

## 2014-04-29 ENCOUNTER — Ambulatory Visit (INDEPENDENT_AMBULATORY_CARE_PROVIDER_SITE_OTHER): Payer: Medicare Other | Admitting: Family Medicine

## 2014-04-29 VITALS — BP 122/63 | HR 67 | Temp 98.7°F | Ht 74.0 in | Wt 175.0 lb

## 2014-04-29 DIAGNOSIS — R209 Unspecified disturbances of skin sensation: Secondary | ICD-10-CM

## 2014-04-29 DIAGNOSIS — M1000 Idiopathic gout, unspecified site: Secondary | ICD-10-CM

## 2014-04-29 MED ORDER — METHYLPREDNISOLONE 4 MG PO TBPK
ORAL_TABLET | ORAL | Status: DC
Start: 2014-04-29 — End: 2014-05-30

## 2014-04-29 NOTE — Telephone Encounter (Signed)
I would set him up to see one of our [place] [facility]ists for bilateral sensory changes of feet/legs.

## 2014-04-29 NOTE — Progress Notes (Signed)
   Subjective:    Patient ID: James Marks, male    DOB: 1940-07-26, 74 y.o.   MRN: [id number]  HPI Here for swelling and pain in the left great toe. No recent trauma. He has never had this before. Heat and Advil do not help.    Review of Systems  Constitutional: Negative.   Musculoskeletal: Positive for joint swelling and arthralgias.       Objective:   Physical Exam  Constitutional: He appears well-developed and well-nourished.  Musculoskeletal:  The MTP joint of the left great toe is swollen, red, warm, and very tender          Assessment & Plan:  Gout. Keep the foot elevated. Given a Medrol dose pack.

## 2014-04-29 NOTE — Telephone Encounter (Signed)
Referral is ordered

## 2014-04-29 NOTE — Telephone Encounter (Signed)
Pt was seen on 3-18 for feet numbness. Pt would like a referral to specialist. Pt is now having feet swelling and decline an appt

## 2014-04-29 NOTE — Progress Notes (Signed)
Pre visit review using our clinic review tool, if applicable. No additional management support is needed unless otherwise documented below in the visit note. 

## 2014-04-29 NOTE — Telephone Encounter (Addendum)
Wife called back to insist pt needs to be seen. She thinks this is different than what referral is for. Pt now has swelling and redness and trouble walking. Wife thinks may need to be seen. sched w/ dr fry.   Does dr Elease Hashimoto want to see him at the end of the day, or ok w/ dr fry? Pt still wants referral.

## 2014-05-30 ENCOUNTER — Encounter: Payer: Self-pay | Admitting: Neurology

## 2014-05-30 ENCOUNTER — Telehealth: Payer: Self-pay | Admitting: Family Medicine

## 2014-05-30 ENCOUNTER — Ambulatory Visit (INDEPENDENT_AMBULATORY_CARE_PROVIDER_SITE_OTHER): Payer: Medicare Other | Admitting: Neurology

## 2014-05-30 VITALS — BP 110/60 | HR 58 | Ht 74.0 in | Wt 175.1 lb

## 2014-05-30 DIAGNOSIS — R7302 Impaired glucose tolerance (oral): Secondary | ICD-10-CM | POA: Diagnosis not present

## 2014-05-30 DIAGNOSIS — G609 Hereditary and idiopathic neuropathy, unspecified: Secondary | ICD-10-CM

## 2014-05-30 DIAGNOSIS — M79672 Pain in left foot: Secondary | ICD-10-CM

## 2014-05-30 NOTE — Telephone Encounter (Signed)
Informed patient. Offered patient a appointment here  In the office to be seen but patient would like to be referred to a podiatrist

## 2014-05-30 NOTE — Patient Instructions (Signed)
1. Check blood work 2. We will call you with the results of your testing 3. If your numbness worsens or you develop new symptoms, please call my office

## 2014-05-30 NOTE — Progress Notes (Signed)
[facility] Clinic Note - Initial Visit   Date: 05/30/2014   James Marks MRN: [id number] DOB: 04-28-1940   Dear Dr. Elease Hashimoto:  Thank you for your kind referral of James Marks for consultation of bilateral feet paresthesias. Although his history is well known to you, please allow [place] to reiterate it for the purpose of our medical record. The patient was accompanied to the clinic by wife who also provides collateral information.     History of Present Illness: James Marks is a 74 y.o. right-handed Caucasian male with hypertension, bilateral hearing loss, SAH due to ruptured aneurysm (2001) with no residual deficits presenting for evaluation of bilateral feet paresthesias.  Starting in January 2016, he developed numbness of the toes which is more apparent at night time.  No associated pain, tingling, imbalance, or weakness.  Denies any exacerbating or alleviating factors.  Symptoms are not bothered by activity.  No significant back discomfort.  Recent labs indicate mild glucose intolerance (HbA1c 6.0), normal vitamin B12 and TSH.  He also complains about left heel pain which is worse with weight bearing and has been ongoing for the pain 3-4 months.  Pain is achy and nonradiating.  Denies any trauma.  He was briefly taking prednisone for left great toe pain and recalls that heel pain transiently improved during this time.  Out-side paper records, electronic medical record, and images have been reviewed where available and summarized as:  IR cerebral angriogram 03/11/1999: NO ANGIOGRAPHICALLY DEMONSTRABLE ANEURYSM, AVM OR VASCULAR ANOMALY IS IDENTIFIED. NO EVIDENCE OF ANGIOGRAPHICALLY DEMONSTRABLE VASOSPASM IS NOTED EITHER.  Past Medical History  Diagnosis Date  . Hx of colonic polyps   . Diverticulitis   . Hypertension   . Osteopenia   . Hearing loss   . Kidney stones   . Hyperlipidemia   . Subarachnoid hemorrhage 2/01    nontraumatic     History reviewed. No pertinent past surgical history.   Medications:  Current Outpatient Prescriptions on File Prior to Visit  Medication Sig Dispense Refill  . aspirin 81 MG EC tablet Take 81 mg by mouth every other day.     . lisinopril-hydrochlorothiazide (PRINZIDE,ZESTORETIC) 10-12.5 MG per tablet Take 1 tablet by mouth daily. 90 tablet 2   No current facility-administered medications on file prior to visit.    Allergies: No Known Allergies  Family History: Family History  Problem Relation Age of Onset  . Cancer Mother     breast  . Diabetes Mother   . Diabetes      parent  . Hypertension Father     Social History: History   Social History  . Marital Status: Single    Spouse Name: N/A  . Number of Children: N/A  . Years of Education: N/A   Occupational History  . Not on file.   Social History Main Topics  . Smoking status: Former Smoker -- 0.80 packs/day for 55 years    Types: Cigarettes  . Smokeless tobacco: Former Systems developer    Quit date: 10/05/2012     Comment: E-Cig  . Alcohol Use: 0.0 oz/week    0 Standard drinks or equivalent per week     Comment: occ  . Drug Use: No  . Sexual Activity: Not on file   Other Topics Concern  . Not on file   Social History Narrative   Lives with wife in a 2 story home.  Has 1 child.  Retired from [employer].  Education: 12th grade.  Review of Systems:  CONSTITUTIONAL: No fevers, chills, night sweats, or weight loss.   EYES: No visual changes or eye pain ENT: No hearing changes.  No history of nose bleeds.   RESPIRATORY: No cough, wheezing and shortness of breath.   CARDIOVASCULAR: Negative for chest pain, and palpitations.   GI: Negative for abdominal discomfort, blood in stools or black stools.  No recent change in bowel habits.   GU:  No history of incontinence.   MUSCLOSKELETAL: No history of joint pain or swelling.  No myalgias.   SKIN: Negative for lesions, rash, and itching.   HEMATOLOGY/ONCOLOGY:  Negative for prolonged bleeding, bruising easily, and swollen nodes.  No history of cancer.   ENDOCRINE: Negative for cold or heat intolerance, polydipsia or goiter.   PSYCH:  No depression or anxiety symptoms.   NEURO: As Above.   Vital Signs:  BP 110/60 mmHg  Pulse 58  Ht 6\' 2"  (1.88 m)  Wt 175 lb 1 oz (79.408 kg)  BMI 22.47 kg/m2  SpO2 98%  General Medical Exam:   General:  Well appearing, comfortable.   Eyes/ENT: see cranial nerve examination.   Neck: No masses appreciated.  Full range of motion without tenderness.  No carotid bruits. Respiratory:  Clear to auscultation, good air entry bilaterally.   Cardiac:  Regular rate and rhythm, no murmur.   Extremities:  No deformities, edema, or skin discoloration.  Skin:  No rashes or lesions.  Neurological Exam: MENTAL STATUS including orientation to time, place, person, recent and remote memory, attention span and concentration, language, and fund of knowledge is normal.  Speech is not dysarthric.  CRANIAL NERVES: II:  No visual field defects.  Unremarkable fundi.   III-IV-VI: Pupils equal round and reactive to light.  Normal conjugate, extra-ocular eye movements in all directions of gaze.  No nystagmus.  No ptosis.   V:  Normal facial sensation.    VII:  Normal facial symmetry and movements.  No pathologic facial reflexes.  VIII:  Impaired hearing bilaterally.    IX-X:  Normal palatal movement.   XI:  Normal shoulder shrug and head rotation.   XII:  Normal tongue strength and range of motion, no deviation or fasciculation.  MOTOR:  No atrophy, fasciculations or abnormal movements.  No pronator drift.  Tone is normal.    Right Upper Extremity:    Left Upper Extremity:    Deltoid  5/5   Deltoid  5/5   Biceps  5/5   Biceps  5/5   Triceps  5/5   Triceps  5/5   Wrist extensors  5/5   Wrist extensors  5/5   Wrist flexors  5/5   Wrist flexors  5/5   Finger extensors  5/5   Finger extensors  5/5   Finger flexors  5/5   Finger  flexors  5/5   Dorsal interossei  5/5   Dorsal interossei  5/5   Abductor pollicis  5/5   Abductor pollicis  5/5   Tone (Ashworth scale)  0  Tone (Ashworth scale)  0   Right Lower Extremity:    Left Lower Extremity:    Hip flexors  5/5   Hip flexors  5/5   Hip extensors  5/5   Hip extensors  5/5   Knee flexors  5/5   Knee flexors  5/5   Knee extensors  5/5   Knee extensors  5/5   Dorsiflexors  5/5   Dorsiflexors  5/5   Plantarflexors  5/5  Plantarflexors  5/5   Toe extensors  5/5   Toe extensors  5/5   Toe flexors  5/5   Toe flexors  5/5   Tone (Ashworth scale)  0  Tone (Ashworth scale)  0   MSRs:  Right                                                                 Left brachioradialis 2+  brachioradialis 2+  biceps 2+  biceps 2+  triceps 2+  triceps 2+  patellar 2+  patellar 2+  ankle jerk 2+  ankle jerk 2+  Hoffman no  Hoffman no  plantar response down  plantar response down   SENSORY:  Absent vibration, temperature, and proprioception at the toes bilaterally, intact at the ankles.  Romberg's sign absent.   COORDINATION/GAIT: Normal finger-to- nose-finger and heel-to-shin.  Intact rapid alternating movements bilaterally.  Able to rise from a chair without using arms.  Gait narrow based and stable. Unsteady with tandem gait.  Unable to perform heel walking due to left heel pain.    IMPRESSION: James Marks is a 73 year-old gentleman presenting for evaluation of bilateral toe paresthesias. His neurological examination shows a distal predominant large fiber peripheral neuropathy. I had extensive discussion with the patient regarding the pathogenesis, etiology, management, and natural course of neuropathy. Neuropathy tends to be slowly progressive, especially if a treatable etiology is not identified.  Even though it is not common, mild elevation in blood glucose (HbA1c 6.0)  can cause neuropathy symptoms so I did encourage him to be cautious of his sugar and carbohydrate intake,  specifically reduce regular pepsi consumption. Laboratory testing for vitamin B1, SPEP/UPEP with IFE, copper, ceruloplasmin, zinc will be ordered.  Vitamin B12 and TSH is within normal limits.  We discussed doing NCS/EMG of the legs, but with his classic history and exam findings, we can hold off on testing unless symptoms evolve in a manner atypical for neuropathy.    Because there is no painful paresthesias, there is no role for neuralgesic medications.  Regarding his left heel pain, I instructed him to discuss this with his PCP as it seems more musculoskeletal in origin.  Return to clinic as needed.   The duration of this appointment visit was 40 minutes of face-to-face time with the patient.  Greater than 50% of this time was spent in counseling, explanation of diagnosis, planning of further management, and coordination of care.   Thank you for allowing me to participate in patient's care.  If I can answer any additional questions, I would be pleased to do so.    Sincerely,    Donika K. Posey Pronto, DO

## 2014-05-30 NOTE — Telephone Encounter (Signed)
We cannot inject achilles (ie achilles tendonitis) but can inject for plantar fasciitis- bottom of heel.

## 2014-05-30 NOTE — Telephone Encounter (Signed)
Pt has an issue w/ his heel dr Elease Hashimoto dx him w/ last visit.  Pt would like to know if dr burchette could give him a cortisone inj in the heel?

## 2014-05-31 NOTE — Telephone Encounter (Signed)
Recommend Dr Caffie Pinto, unless he has another preference.

## 2014-05-31 NOTE — Telephone Encounter (Signed)
Spoke with wife and referral placed.

## 2014-06-01 LAB — COPPER, SERUM: Copper: 89 ug/dL (ref 70–175)

## 2014-06-01 LAB — ZINC: Zinc: 67 ug/dL (ref 60–130)

## 2014-06-03 ENCOUNTER — Telehealth: Payer: Self-pay | Admitting: Neurology

## 2014-06-03 LAB — SPEP & IFE WITH QIG
ABNORMAL PROTEIN BAND1: 0.6 g/dL
Albumin ELP: 3.8 g/dL (ref 3.8–4.8)
Alpha-1-Globulin: 0.3 g/dL (ref 0.2–0.3)
Alpha-2-Globulin: 0.7 g/dL (ref 0.5–0.9)
Beta 2: 1 g/dL — ABNORMAL HIGH (ref 0.2–0.5)
Beta Globulin: 0.5 g/dL (ref 0.4–0.6)
Gamma Globulin: 1.3 g/dL (ref 0.8–1.7)
IGA: 1110 mg/dL — AB (ref 68–379)
IGG (IMMUNOGLOBIN G), SERUM: 1350 mg/dL (ref 650–1600)
IgM, Serum: 89 mg/dL (ref 41–251)
Total Protein, Serum Electrophoresis: 7.5 g/dL (ref 6.1–8.1)

## 2014-06-03 LAB — UIFE/LIGHT CHAINS/TP QN, 24-HR UR
ALBUMIN, U: DETECTED
ALPHA 2 UR: DETECTED — AB
Alpha 1, Urine: DETECTED — AB
BETA UR: DETECTED — AB
GAMMA UR: DETECTED — AB
Total Protein, Urine: 17 mg/dL (ref 5–25)

## 2014-06-03 LAB — CERULOPLASMIN: Ceruloplasmin: 25 mg/dL (ref 18–36)

## 2014-06-03 LAB — VITAMIN B1: Vitamin B1 (Thiamine): 11 nmol/L (ref 8–30)

## 2014-06-03 NOTE — Telephone Encounter (Signed)
Labs discussed with patient and his wife.  Due to hearing impairment, patient requests to speak with his wife [phone]).  Labs shows monoclonal paraproteinemia so will send referral to hematology for evaluation.  Discussed that sometimes these findings are nonspecific and do not correlate with neuropathy.

## 2014-06-04 ENCOUNTER — Other Ambulatory Visit: Payer: Self-pay | Admitting: *Deleted

## 2014-06-04 DIAGNOSIS — D472 Monoclonal gammopathy: Secondary | ICD-10-CM

## 2014-06-04 NOTE — Telephone Encounter (Signed)
Referral sent.  I spoke with Tiffany (new patient coordinator) and gave her the wife's cell phone number.  Tiffany will call her to set up the appointment.

## 2014-06-06 ENCOUNTER — Telehealth: Payer: Self-pay | Admitting: Neurology

## 2014-06-06 NOTE — Telephone Encounter (Signed)
Pt called and stated she was to see a hematologist but no one has called her to set up the appointment yet/Dawn

## 2014-06-06 NOTE — Telephone Encounter (Signed)
Spoke with patient's wife and informed her that I sent the referral but that I will follow up on this tomorrow to see what is going on and call her back.

## 2014-06-07 ENCOUNTER — Telehealth: Payer: Self-pay | Admitting: Hematology & Oncology

## 2014-06-07 DIAGNOSIS — M71572 Other bursitis, not elsewhere classified, left ankle and foot: Secondary | ICD-10-CM | POA: Diagnosis not present

## 2014-06-07 DIAGNOSIS — M7732 Calcaneal spur, left foot: Secondary | ICD-10-CM | POA: Diagnosis not present

## 2014-06-07 DIAGNOSIS — M722 Plantar fascial fibromatosis: Secondary | ICD-10-CM | POA: Diagnosis not present

## 2014-06-07 DIAGNOSIS — M79672 Pain in left foot: Secondary | ICD-10-CM | POA: Diagnosis not present

## 2014-06-07 NOTE — Telephone Encounter (Signed)
Called Mrs. Cicero back and she said that the hematology office called and set up patient's appointment.

## 2014-06-07 NOTE — Telephone Encounter (Signed)
Patient was was called and i spoke with wife.  i gave apt date/time, location and to bring medications bottle.

## 2014-06-07 NOTE — Telephone Encounter (Signed)
Called Tiffany at the Cancer center to check on patient's appt.  Left message for her to call me back.

## 2014-06-11 DIAGNOSIS — M10079 Idiopathic gout, unspecified ankle and foot: Secondary | ICD-10-CM | POA: Diagnosis not present

## 2014-06-11 DIAGNOSIS — M10072 Idiopathic gout, left ankle and foot: Secondary | ICD-10-CM | POA: Diagnosis not present

## 2014-06-11 DIAGNOSIS — M10071 Idiopathic gout, right ankle and foot: Secondary | ICD-10-CM | POA: Diagnosis not present

## 2014-06-18 DIAGNOSIS — M65872 Other synovitis and tenosynovitis, left ankle and foot: Secondary | ICD-10-CM | POA: Diagnosis not present

## 2014-06-18 DIAGNOSIS — M10072 Idiopathic gout, left ankle and foot: Secondary | ICD-10-CM | POA: Diagnosis not present

## 2014-06-18 DIAGNOSIS — M10071 Idiopathic gout, right ankle and foot: Secondary | ICD-10-CM | POA: Diagnosis not present

## 2014-06-18 DIAGNOSIS — M792 Neuralgia and neuritis, unspecified: Secondary | ICD-10-CM | POA: Diagnosis not present

## 2014-06-18 DIAGNOSIS — M722 Plantar fascial fibromatosis: Secondary | ICD-10-CM | POA: Diagnosis not present

## 2014-07-23 ENCOUNTER — Encounter: Payer: Self-pay | Admitting: Hematology & Oncology

## 2014-07-23 ENCOUNTER — Ambulatory Visit: Payer: Medicare Other

## 2014-07-23 ENCOUNTER — Ambulatory Visit (HOSPITAL_BASED_OUTPATIENT_CLINIC_OR_DEPARTMENT_OTHER): Payer: Medicare Other

## 2014-07-23 ENCOUNTER — Ambulatory Visit (HOSPITAL_BASED_OUTPATIENT_CLINIC_OR_DEPARTMENT_OTHER): Payer: Medicare Other | Admitting: Hematology & Oncology

## 2014-07-23 ENCOUNTER — Ambulatory Visit (HOSPITAL_BASED_OUTPATIENT_CLINIC_OR_DEPARTMENT_OTHER)
Admission: RE | Admit: 2014-07-23 | Discharge: 2014-07-23 | Disposition: A | Discharge status: Home / Self Care | Payer: Medicare Other | Source: Ambulatory Visit | Attending: Hematology & Oncology | Admitting: Hematology & Oncology

## 2014-07-23 VITALS — BP 123/59 | HR 54 | Temp 97.3°F | Resp 16 | Ht 75.0 in | Wt 173.0 lb

## 2014-07-23 DIAGNOSIS — M85631 Other cyst of bone, right forearm: Secondary | ICD-10-CM | POA: Insufficient documentation

## 2014-07-23 DIAGNOSIS — D472 Monoclonal gammopathy: Secondary | ICD-10-CM

## 2014-07-23 DIAGNOSIS — G629 Polyneuropathy, unspecified: Secondary | ICD-10-CM

## 2014-07-23 DIAGNOSIS — R590 Localized enlarged lymph nodes: Secondary | ICD-10-CM | POA: Diagnosis not present

## 2014-07-23 DIAGNOSIS — R599 Enlarged lymph nodes, unspecified: Secondary | ICD-10-CM | POA: Diagnosis present

## 2014-07-23 LAB — CBC WITH DIFFERENTIAL (CANCER CENTER ONLY)
BASO#: 0 10*3/uL (ref 0.0–0.2)
BASO%: 0.5 % (ref 0.0–2.0)
EOS ABS: 0.1 10*3/uL (ref 0.0–0.5)
EOS%: 1.2 % (ref 0.0–7.0)
HCT: 40.1 % (ref 38.7–49.9)
HGB: 13.3 g/dL (ref 13.0–17.1)
LYMPH#: 1.4 10*3/uL (ref 0.9–3.3)
LYMPH%: 17.7 % (ref 14.0–48.0)
MCH: 28.8 pg (ref 28.0–33.4)
MCHC: 33.2 g/dL (ref 32.0–35.9)
MCV: 87 fL (ref 82–98)
MONO#: 0.5 10*3/uL (ref 0.1–0.9)
MONO%: 6.4 % (ref 0.0–13.0)
NEUT#: 5.7 10*3/uL (ref 1.5–6.5)
NEUT%: 74.2 % (ref 40.0–80.0)
Platelets: 239 10*3/uL (ref 145–400)
RBC: 4.62 10*6/uL (ref 4.20–5.70)
RDW: 13.5 % (ref 11.1–15.7)
WBC: 7.7 10*3/uL (ref 4.0–10.0)

## 2014-07-23 LAB — CMP (CANCER CENTER ONLY)
ALK PHOS: 106 U/L — AB (ref 26–84)
ALT(SGPT): 14 U/L (ref 10–47)
AST: 20 U/L (ref 11–38)
Albumin: 3.4 g/dL (ref 3.3–5.5)
BUN, Bld: 19 mg/dL (ref 7–22)
CO2: 28 mEq/L (ref 18–33)
CREATININE: 1.5 mg/dL — AB (ref 0.6–1.2)
Calcium: 9.2 mg/dL (ref 8.0–10.3)
Chloride: 104 mEq/L (ref 98–108)
Glucose, Bld: 100 mg/dL (ref 73–118)
Potassium: 4.6 mEq/L (ref 3.3–4.7)
SODIUM: 138 meq/L (ref 128–145)
TOTAL PROTEIN: 7.6 g/dL (ref 6.4–8.1)
Total Bilirubin: 0.6 mg/dl (ref 0.20–1.60)

## 2014-07-23 NOTE — Progress Notes (Signed)
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 New Patient  : I have numbness in my feet.  HPI: James Marks is a very nice 73 year old gentleman. 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  Over the past 8 months or so, he's had progressive neuropathy. This is mostly in his feet.  He was seen by neurology. They were very thorough. He had an SPEP done. He was found to have a monoclonal spike of 0.5 g/dL. This was further characterized as an IgA Kappa M spike. His IgA level was elevated at 1110 mg/dL. His IgG and IgM levels were normal.  He had other lab studies done. Everything else looked okay.  He did have a random urine done. This did show some monoclonal Kappa light chains.  Because this, he was referred to the lesser [facility] for an evaluation.  The neuropathy seems to be worse when he is standing. It may be a little bit worse when he lies down or stands.  He's had no problem with infections. He denies any type of bone pain. He's had no cough.  He was a smoker. His wife thinks he probably still smokes a little bit. He probably has a 50-pack-year history of tobacco use.  He used to drink socially. He really does not do this any longer.  He's had no problem with bowels or bladder. He's had no leg swelling. He's had no weight loss or weight gain. His wife thinks that he has lost a little bit of weight.  There is no rashes. He's had no bruising. He's had no bleeding.  His last colonoscopy was probably about 5 years ago. He has had some polyps.  Overall, his performance status is ECOG 1.    Past Medical History  Diagnosis Date  . Hx of colonic polyps   . Diverticulitis   . Hypertension   . Osteopenia   . Hearing loss   . Kidney stones   . Hyperlipidemia   . Subarachnoid hemorrhage 2/01    nontraumatic  :  Past Surgical History  Procedure Laterality Date  . None    :   Current  outpatient prescriptions:  .  aspirin 81 MG EC tablet, Take 81 mg by mouth every other day. , Disp: , Rfl:  .  lisinopril-hydrochlorothiazide (PRINZIDE,ZESTORETIC) 10-12.5 MG per tablet, Take 1 tablet by mouth daily., Disp: 90 tablet, Rfl: 2:  :  No Known Allergies:  Family History  Problem Relation Age of Onset  . Cancer Mother     breast  . Diabetes Mother   . Diabetes      parent  . Hypertension Father   . Macular degeneration Brother   . Diabetes Mellitus II Brother   :  History   Social History  . Marital Status: Single    Spouse Name: N/A  . Number of Children: N/A  . Years of Education: N/A   Occupational History  . Not on file.   Social History Main Topics  . Smoking status: Former Smoker -- 0.80 packs/day for 55 years    Types: Cigarettes  . Smokeless tobacco: Former Systems developer    Quit date: 10/05/2012     Comment: E-Cig  . Alcohol Use: 0.0 oz/week    0 Standard drinks or equivalent per week     Comment: Rare, once every three months  . Drug Use: No  . Sexual Activity: Not on  file   Other Topics Concern  . Not on file   Social History Narrative   Lives with wife in a 2 story home.  Has 1 child.  Retired from [employer].  Education: 12th grade.  :  Pertinent items are noted in HPI.  Exam: @IPVITALS @  tall, thin white gentleman in no obvious distress. Vital signs show a temperature of 97.3. Pulse 54. Blood pressure 120/59. Weight is 173lbs. Head and neck exam shows normocephalic and atraumatic skull. He has no ocular or oral lesions. There are no palpable cervical or supraclavicular lymph nodes. Lungs are clear. Cardiac exam regular rate and rhythm with no murmurs, rubs or bruits. Abdomen is soft. He has good bowel sounds. There is no fluid wave. There is no palpable liver or spleen tip. Axillary exam shows no axillary adenopathy in the left axilla. In the right axilla, it feels like he has a 2 cm palpable lymph gland. This is slightly firm. It is mobile and  nontender. Extremities shows no clubbing, cyanosis or edema. He has some osteoarthritic changes in his joints. Skin exam shows no rashes, ecchymoses or petechia. Neurological exam is nonfocal.    Recent Labs  07/23/14 1003  WBC 7.7  HGB 13.3  HCT 40.1  PLT 239    Recent Labs  07/23/14 1003  NA 138  K 4.6  CL 104  CO2 28  GLUCOSE 100  BUN 19  CREATININE 1.5*  CALCIUM 9.2    Blood smear review: Normochromic and normocytic ablation of red blood cells. There is no nucleated red cells. There are no teardrop cells. He has no rouleau formation. There are no schistocytes or spherocytes. He has no target cells. White cells are normal in morphology maturation. He has no immature myeloid or lymphoid forms.  There are no hypersegmented polys. Platelets are adequate in number and size. He has well granulated platelets.  Pathology: None     Assessment and Plan: James Marks is a 73 year old gentleman. He has peripheral neuropathy. He has an IgA kappa M spike. He has a right axillary lymph node.  I think that he clearly needs to have a CT scan done. With the lymphadenopathy, and M spike, lymphoma is always a concern. We will see what the CT scan shows. The fact that he used to smoke also warrants a CT scan.  I am not sure that this M spike is going be related to the neuropathy. It may be a separate finding.  I must say that his IgA level is quite high. The fact that his IgG and IgG and levels are normal often indicates that there is no malignant process.  I will get a bone survey on him. I think this will help [place] out.  It is possible he may need to have a bone marrow test. I would leave this as a test of "last resort".  The fact that his total protein is not high nor his albumin is low also would dimly go against myeloma.  We also will get a 24-hour urine on him. I this will be important.  I spent about 45 minutes with he and his wife. I answered all their questions. I explained what I  thought was going on.  We will get these x-rays and scans. I'll see her the urine test shows. Once he get all the results back, that we will get him back to the office.

## 2014-07-24 ENCOUNTER — Telehealth: Payer: Self-pay | Admitting: *Deleted

## 2014-07-24 NOTE — Telephone Encounter (Signed)
-----   Message from [name], MD sent at 07/24/2014  7:10 AM EDT ----- Floydene Flock hsi wife - Bone xrays do not show any involvement by myeloma!!!!  This is what I expected.  James Marks

## 2014-07-25 LAB — PROTEIN ELECTROPHORESIS, SERUM, WITH REFLEX
Abnormal Protein Band1: 0.6 g/dL
Albumin ELP: 3.5 g/dL — ABNORMAL LOW (ref 3.8–4.8)
Alpha-1-Globulin: 0.3 g/dL (ref 0.2–0.3)
Alpha-2-Globulin: 0.7 g/dL (ref 0.5–0.9)
Beta 2: 0.9 g/dL — ABNORMAL HIGH (ref 0.2–0.5)
Beta Globulin: 0.5 g/dL (ref 0.4–0.6)
GAMMA GLOBULIN: 1.1 g/dL (ref 0.8–1.7)
Total Protein, Serum Electrophoresis: 7 g/dL (ref 6.1–8.1)

## 2014-07-25 LAB — IFE INTERPRETATION

## 2014-07-25 LAB — LACTATE DEHYDROGENASE: LDH: 99 U/L (ref 94–250)

## 2014-07-25 LAB — KAPPA/LAMBDA LIGHT CHAINS
KAPPA LAMBDA RATIO: 3.37 — AB (ref 0.26–1.65)
Kappa free light chain: 9.58 mg/dL — ABNORMAL HIGH (ref 0.33–1.94)
Lambda Free Lght Chn: 2.84 mg/dL — ABNORMAL HIGH (ref 0.57–2.63)

## 2014-07-25 LAB — IGG, IGA, IGM
IgA: 1100 mg/dL — ABNORMAL HIGH (ref 68–379)
IgG (Immunoglobin G), Serum: 1210 mg/dL (ref 650–1600)
IgM, Serum: 80 mg/dL (ref 41–251)

## 2014-07-25 LAB — BETA 2 MICROGLOBULIN, SERUM: Beta-2 Microglobulin: 4.06 mg/L — ABNORMAL HIGH (ref <=2.51)

## 2014-07-26 ENCOUNTER — Ambulatory Visit (HOSPITAL_BASED_OUTPATIENT_CLINIC_OR_DEPARTMENT_OTHER)
Admission: RE | Admit: 2014-07-26 | Discharge: 2014-07-26 | Disposition: A | Discharge status: Home / Self Care | Payer: Medicare Other | Source: Ambulatory Visit | Attending: Hematology & Oncology | Admitting: Hematology & Oncology

## 2014-07-26 DIAGNOSIS — R599 Enlarged lymph nodes, unspecified: Secondary | ICD-10-CM | POA: Diagnosis present

## 2014-07-26 DIAGNOSIS — R590 Localized enlarged lymph nodes: Secondary | ICD-10-CM

## 2014-07-26 DIAGNOSIS — I2510 Atherosclerotic heart disease of native coronary artery without angina pectoris: Secondary | ICD-10-CM | POA: Diagnosis not present

## 2014-07-26 DIAGNOSIS — D472 Monoclonal gammopathy: Secondary | ICD-10-CM | POA: Diagnosis not present

## 2014-07-26 DIAGNOSIS — R911 Solitary pulmonary nodule: Secondary | ICD-10-CM | POA: Diagnosis not present

## 2014-07-26 MED ORDER — IOHEXOL 300 MG/ML  SOLN
75.0000 mL | Freq: Once | INTRAMUSCULAR | Status: AC | PRN
  Administered 2014-07-26: 75 mL via INTRAVENOUS

## 2014-07-29 ENCOUNTER — Telehealth: Payer: Self-pay | Admitting: *Deleted

## 2014-07-29 ENCOUNTER — Other Ambulatory Visit: Payer: Medicare Other

## 2014-07-29 DIAGNOSIS — D472 Monoclonal gammopathy: Secondary | ICD-10-CM | POA: Diagnosis not present

## 2014-07-29 NOTE — Telephone Encounter (Addendum)
Patient aware of results   ----- Message from [name], MD sent at 07/26/2014  3:48 PM EDT ----- Call - ct scan is normal for lymph nodes.  No enlarged lymph noes are seen.  Everything looks ok!!  James Marks

## 2014-07-31 ENCOUNTER — Encounter: Payer: Self-pay | Admitting: Family Medicine

## 2014-07-31 ENCOUNTER — Ambulatory Visit (INDEPENDENT_AMBULATORY_CARE_PROVIDER_SITE_OTHER): Payer: Medicare Other | Admitting: Family Medicine

## 2014-07-31 DIAGNOSIS — R350 Frequency of micturition: Secondary | ICD-10-CM | POA: Diagnosis not present

## 2014-07-31 DIAGNOSIS — N400 Enlarged prostate without lower urinary tract symptoms: Secondary | ICD-10-CM

## 2014-07-31 LAB — POCT URINALYSIS DIPSTICK
Bilirubin, UA: NEGATIVE
Blood, UA: NEGATIVE
GLUCOSE UA: NEGATIVE
KETONES UA: NEGATIVE
Leukocytes, UA: NEGATIVE
Nitrite, UA: NEGATIVE
PH UA: 5.5
Protein, UA: NEGATIVE
Spec Grav, UA: 1.02
UROBILINOGEN UA: 0.2

## 2014-07-31 MED ORDER — TAMSULOSIN HCL 0.4 MG PO CAPS: 0.4000 mg | ORAL_CAPSULE | Freq: Every day | ORAL | Status: DC

## 2014-07-31 NOTE — Patient Instructions (Signed)
Benign Prostatic Hyperplasia An enlarged prostate (benign prostatic hyperplasia) is common in older men. You may experience the following:  Weak urine stream.  Dribbling.  Feeling like the bladder has not emptied completely.  Difficulty starting urination.  Getting up frequently at night to urinate.  Urinating more frequently during the day. HOME CARE INSTRUCTIONS  Monitor your prostatic hyperplasia for any changes. The following actions may help to alleviate any discomfort you are experiencing:  Give yourself time when you urinate.  Stay away from alcohol.  Avoid beverages containing caffeine, such as coffee, tea, and colas, because they can make the problem worse.  Avoid decongestants, antihistamines, and some prescription medicines that can make the problem worse.  Follow up with your health care provider for further treatment as recommended. SEEK MEDICAL CARE IF:  You are experiencing progressive difficulty voiding.  Your urine stream is progressively getting narrower.  You are awaking from sleep with the urge to void more frequently.  You are constantly feeling the need to void.  You experience loss of urine, especially in small amounts. SEEK IMMEDIATE MEDICAL CARE IF:   You develop increased pain with urination or are unable to urinate.  You develop severe abdominal pain, vomiting, a high fever, or fainting.  You develop back pain or blood in your urine. MAKE SURE YOU:   Understand these instructions.  Will watch your condition.  Will get help right away if you are not doing well or get worse. Document Released: 12/28/2004 Document Revised: 08/30/2012 Document Reviewed: 05/30/2012 ExitCare Patient Information 2015 [employer]. This information is not intended to replace advice given to you by your health care provider. Make sure you discuss any questions you have with your health care provider.  

## 2014-07-31 NOTE — Progress Notes (Signed)
Pre visit review using our clinic review tool, if applicable. No additional management support is needed unless otherwise documented below in the visit note. 

## 2014-07-31 NOTE — Progress Notes (Signed)
   Subjective:    Patient ID: James Marks, male    DOB: 01-08-1941, 74 y.o.   MRN: [id number]  HPI Patient seen with urine frequency at night and recent slow stream. He states for at least 3-4 months he's had some progressive slow stream and difficulty emptying bladder. He denies any burning with urination. Usually gets up to occasionally 3 times a night to urinate. Does not take any anticholinergic medications. No history of urinary urgency  Recent history is that he developed bilateral sentry neuropathy. Serum protein electrophoresis with monoclonal gammopathy of unknown significance. Followed by oncology and being evaluated  Past Medical History  Diagnosis Date  . Hx of colonic polyps   . Diverticulitis   . Hypertension   . Osteopenia   . Hearing loss   . Kidney stones   . Hyperlipidemia   . Subarachnoid hemorrhage 2/01    nontraumatic   Past Surgical History  Procedure Laterality Date  . None      reports that he has quit smoking. His smoking use included Cigarettes. He has a 44 pack-year smoking history. He quit smokeless tobacco use about 21 months ago. He reports that he drinks alcohol. He reports that he does not use illicit drugs. family history includes Cancer in his mother; Diabetes in his mother and another family member; Diabetes Mellitus II in his brother; Hypertension in his father; Macular degeneration in his brother. No Known Allergies    Review of Systems  Constitutional: Negative for fever and chills.  Respiratory: Negative for shortness of breath.   Gastrointestinal: Negative for abdominal pain.  Genitourinary: Positive for frequency, decreased urine volume and difficulty urinating. Negative for hematuria.       Objective:   Physical Exam  Constitutional: He appears well-developed and well-nourished. No distress.  Cardiovascular: Normal rate and regular rhythm.   Pulmonary/Chest: Effort normal and breath sounds normal. No respiratory distress. He has  no wheezes. He has no rales.  Abdominal: Soft. There is no tenderness.          Assessment & Plan:  BPH.  He is describing slow stream as well as frequent nocturia. Urinalysis is unremarkable. Avoid anti-cholinergic medications. Recommend trial of Flomax 0.4 mg daily at bedtime. Reviewed possible side effects. Touch base in a couple weeks if not improving

## 2014-08-02 ENCOUNTER — Telehealth: Payer: Self-pay | Admitting: *Deleted

## 2014-08-02 NOTE — Telephone Encounter (Signed)
Message left on voice mail from patient's son. He states family is very anxious to receive results from previous weeks visit. He states his mother is 'beside herself' with worry and panic. Message relayed to Dr Marin Olp who states he will call patient this afternoon.

## 2014-08-03 LAB — UIFE/LIGHT CHAINS/TP QN, 24-HR UR
ALPHA 1 UR: DETECTED — AB
Albumin, U: DETECTED
Alpha 2, Urine: DETECTED — AB
Beta, Urine: DETECTED — AB
GAMMA UR: DETECTED — AB
TOTAL PROTEIN, URINE-UPE24: 9 mg/dL (ref 5–25)
TOTAL PROTEIN, URINE-UR/DAY: 198 mg/d — AB (ref <150)
Time: 24 hours
VOLUME, URINE-UPE24: 2200 mL

## 2014-08-03 LAB — 24 HR URINE,KAPPA/LAMBDA LIGHT CHAINS
24H Urine Volume: 2200 mL/24 h
Measured Kappa Chain: 3.3 mg/dL — ABNORMAL HIGH (ref <2.00)
Measured Lambda Chain: 0.42 mg/dL (ref <2.00)
TOTAL LAMBDA CHAIN: 9.24 mg/(24.h)
Total Kappa Chain: 72.6 mg/24 h

## 2014-08-05 ENCOUNTER — Other Ambulatory Visit: Payer: Self-pay | Admitting: Hematology & Oncology

## 2014-08-05 ENCOUNTER — Ambulatory Visit (HOSPITAL_BASED_OUTPATIENT_CLINIC_OR_DEPARTMENT_OTHER): Payer: Medicare Other | Admitting: Family

## 2014-08-05 ENCOUNTER — Encounter (HOSPITAL_BASED_OUTPATIENT_CLINIC_OR_DEPARTMENT_OTHER): Payer: Medicare Other | Admitting: Hematology & Oncology

## 2014-08-05 ENCOUNTER — Ambulatory Visit (HOSPITAL_BASED_OUTPATIENT_CLINIC_OR_DEPARTMENT_OTHER): Payer: Medicare Other

## 2014-08-05 ENCOUNTER — Telehealth: Payer: Self-pay | Admitting: Hematology & Oncology

## 2014-08-05 VITALS — BP 111/76 | HR 117 | Temp 98.2°F | Resp 20 | Ht 73.0 in | Wt 170.0 lb

## 2014-08-05 DIAGNOSIS — R05 Cough: Secondary | ICD-10-CM

## 2014-08-05 DIAGNOSIS — D472 Monoclonal gammopathy: Secondary | ICD-10-CM

## 2014-08-05 DIAGNOSIS — R5383 Other fatigue: Secondary | ICD-10-CM

## 2014-08-05 DIAGNOSIS — R112 Nausea with vomiting, unspecified: Secondary | ICD-10-CM | POA: Diagnosis not present

## 2014-08-05 DIAGNOSIS — R1110 Vomiting, unspecified: Secondary | ICD-10-CM

## 2014-08-05 DIAGNOSIS — C9000 Multiple myeloma not having achieved remission: Secondary | ICD-10-CM

## 2014-08-05 DIAGNOSIS — R197 Diarrhea, unspecified: Secondary | ICD-10-CM | POA: Diagnosis not present

## 2014-08-05 LAB — CBC WITH DIFFERENTIAL (CANCER CENTER ONLY)
BASO#: 0 10*3/uL (ref 0.0–0.2)
BASO%: 0.1 % (ref 0.0–2.0)
EOS%: 0.2 % (ref 0.0–7.0)
Eosinophils Absolute: 0 10*3/uL (ref 0.0–0.5)
HCT: 44.6 % (ref 38.7–49.9)
HGB: 14.6 g/dL (ref 13.0–17.1)
LYMPH#: 0.4 10*3/uL — AB (ref 0.9–3.3)
LYMPH%: 2.5 % — ABNORMAL LOW (ref 14.0–48.0)
MCH: 28.5 pg (ref 28.0–33.4)
MCHC: 32.7 g/dL (ref 32.0–35.9)
MCV: 87 fL (ref 82–98)
MONO#: 0.6 10*3/uL (ref 0.1–0.9)
MONO%: 4 % (ref 0.0–13.0)
NEUT#: 14.7 10*3/uL — ABNORMAL HIGH (ref 1.5–6.5)
NEUT%: 93.2 % — ABNORMAL HIGH (ref 40.0–80.0)
PLATELETS: 276 10*3/uL (ref 145–400)
RBC: 5.13 10*6/uL (ref 4.20–5.70)
RDW: 13.8 % (ref 11.1–15.7)
WBC: 15.8 10*3/uL — AB (ref 4.0–10.0)

## 2014-08-05 LAB — CMP (CANCER CENTER ONLY)
ALBUMIN: 3.7 g/dL (ref 3.3–5.5)
ALT: 17 U/L (ref 10–47)
AST: 21 U/L (ref 11–38)
Alkaline Phosphatase: 104 U/L — ABNORMAL HIGH (ref 26–84)
BUN: 29 mg/dL — AB (ref 7–22)
CO2: 23 mEq/L (ref 18–33)
CREATININE: 2 mg/dL — AB (ref 0.6–1.2)
Calcium: 9.3 mg/dL (ref 8.0–10.3)
Chloride: 109 mEq/L — ABNORMAL HIGH (ref 98–108)
Glucose, Bld: 142 mg/dL — ABNORMAL HIGH (ref 73–118)
Potassium: 4.5 mEq/L (ref 3.3–4.7)
Sodium: 140 mEq/L (ref 128–145)
TOTAL PROTEIN: 8.4 g/dL — AB (ref 6.4–8.1)
Total Bilirubin: 0.7 mg/dl (ref 0.20–1.60)

## 2014-08-05 MED ORDER — SODIUM CHLORIDE 0.9 % IV SOLN
INTRAVENOUS | Status: DC
  Administered 2014-08-05: 15:00:00 via INTRAVENOUS

## 2014-08-05 NOTE — Progress Notes (Signed)
Hematology and Oncology Follow Up Visit  James Marks [id number] 20-Jul-1940 74 y.o. 08/05/2014   Principle Diagnosis:  MGUS  Current Therapy:   Observation    Interim History:  Mr. Gerads is here today with his wife and grandson for c/o n/v and diaeehea for the last few days. He is feeling very fatigued and sleeping more than usual. He has not been able to play golf for 2 weeks.  He has a cough with clear phlegm. No SOB. He has had a low grade fever of 100 F at home. He is now down to 98.  He has had no rash, dizziness, headaches, chest pain, palpitations, abdominal pain, constipation, blood in urine or stool.  No falls or syncopal episodes.  He started Flomax last week and his noticed a real improvement.  He is able to eat a little and drink but appears dehydrated. We are giving him fluids. His weight is down 3 lbs since his last visit.  His CT of the chest and bone survey were negative for malignancy.  No swelling or tenderness in his extremities. He has neuropathy in his feet.   Medications:    Medication List       This list is accurate as of: 08/05/14  5:00 PM.  Always use your most recent med list.               lisinopril-hydrochlorothiazide 10-12.5 MG per tablet  Commonly known as:  PRINZIDE,ZESTORETIC  Take 1 tablet by mouth daily.     tamsulosin 0.4 MG Caps capsule  Commonly known as:  FLOMAX  Take 1 capsule (0.4 mg total) by mouth daily.        Allergies: No Known Allergies  Past Medical History, Surgical history, Social history, and Family History were reviewed and updated.  Review of Systems: All other 10 point review of systems is negative.   Physical Exam:  vitals were not taken for this visit.  Wt Readings from Last 3 Encounters:  08/05/14 170 lb (77.111 kg)  07/23/14 173 lb (78.472 kg)  05/30/14 175 lb 1 oz (79.408 kg)    Ocular: Sclerae unicteric, pupils equal, round and reactive to light Ear-nose-throat: Oropharynx clear, dentition  fair Lymphatic: No cervical or supraclavicular adenopathy Lungs no rales or rhonchi, good excursion bilaterally Heart regular rate and rhythm, no murmur appreciated Abd soft, nontender, positive bowel sounds MSK no focal spinal tenderness, no joint edema Neuro: non-focal, well-oriented, appropriate affect Breasts: Deferred  Lab Results  Component Value Date   WBC 15.8* 08/05/2014   HGB 14.6 08/05/2014   HCT 44.6 08/05/2014   MCV 87 08/05/2014   PLT 276 08/05/2014   No results found for: FERRITIN, IRON, TIBC, UIBC, IRONPCTSAT Lab Results  Component Value Date   RBC 5.13 08/05/2014   Lab Results  Component Value Date   KPAFRELGTCHN 9.58* 07/23/2014   LAMBDASER 2.84* 07/23/2014   KAPLAMBRATIO 3.37* 07/23/2014   Lab Results  Component Value Date   IGGSERUM 1210 07/23/2014   IGA 1100* 07/23/2014   IGMSERUM 80 07/23/2014   Lab Results  Component Value Date   TOTALPROTELP 7.0 07/23/2014   ALBUMINELP 3.5* 07/23/2014   A1GS 0.3 07/23/2014   A2GS 0.7 07/23/2014   BETS 0.5 07/23/2014   BETA2SER 0.9* 07/23/2014   GAMS 1.1 07/23/2014   SPEI * 07/23/2014     Chemistry      Component Value Date/Time   NA 140 08/05/2014 1454   NA 142 03/29/2014 0000   K 4.5  08/05/2014 1454   K 5.6* 03/29/2014 0000   CL 109* 08/05/2014 1454   CL 105 03/29/2014 0000   CO2 23 08/05/2014 1454   CO2 28 03/29/2014 0000   BUN 29* 08/05/2014 1454   BUN 27* 03/29/2014 0000   CREATININE 2.0* 08/05/2014 1454   CREATININE 1.8* 10/05/2013 0922      Component Value Date/Time   CALCIUM 9.3 08/05/2014 1454   CALCIUM 9.5 03/29/2014 0000   ALKPHOS 104* 08/05/2014 1454   ALKPHOS 94 10/04/2011 0953   AST 21 08/05/2014 1454   AST 17 10/04/2011 0953   ALT 17 08/05/2014 1454   ALT 14 10/04/2011 0953   BILITOT 0.70 08/05/2014 1454   BILITOT 0.4 10/04/2011 0953     Impression and Plan: Mr. Westberg is a 74 year old gentleman with MGUS. He is here today with c/o n/v, cough, diarrhea and fatigue.This  appears to be viral.  His WBC count is elevated at 15.8. No anemia. We will give him fluids and manage his nausea with dramamine. Both his wife and grandson know to contact us with any questions or concerns. We can certainly see him back sooner if need be. If his nausea and vomiting persist we will try something stronger. Right now he seems to be feeling better.   He is schedule for a bone marrow biopsy next Wednesday. CT of the chest and bone survey were negative for malignancy.  Eliezer Bottom, NP 7/25/20165:00 PM

## 2014-08-05 NOTE — Telephone Encounter (Signed)
CONFIRMED BONE MARROW DATE AND INSTRUCTIONS. PT'S WIFE CONFIRM

## 2014-08-05 NOTE — Patient Instructions (Signed)

## 2014-08-05 NOTE — Addendum Note (Signed)
Addended by: Jaci Carrel A on: 08/05/2014 05:29 PM   Modules accepted: Orders

## 2014-08-07 ENCOUNTER — Telehealth: Payer: Self-pay | Admitting: Family Medicine

## 2014-08-07 ENCOUNTER — Other Ambulatory Visit: Payer: Self-pay | Admitting: Family Medicine

## 2014-08-07 DIAGNOSIS — N183 Chronic kidney disease, stage 3 unspecified: Secondary | ICD-10-CM

## 2014-08-07 NOTE — Telephone Encounter (Signed)
Agree with set up nephrology referral.  His kidney function had been stable for several years until recently

## 2014-08-07 NOTE — Progress Notes (Signed)
This encounter was created in error - please disregard.

## 2014-08-07 NOTE — Telephone Encounter (Signed)
Pt needs referral to nephrology . Dr Jonette Eva states dr burchette needs to be the one to refer since he dx w/ stage 3 kidney   Pt is having bone marrow testing next tues to confirm smoldering myeloma.  They figure it will take a while to get appt anyway.

## 2014-08-07 NOTE — Telephone Encounter (Signed)
Referral ordered

## 2014-08-08 ENCOUNTER — Telehealth: Payer: Self-pay | Admitting: *Deleted

## 2014-08-08 NOTE — Telephone Encounter (Signed)
Patient's wife stating that patient is still experiencing diarrhea - 4-5 stools per day. He is eating and drinking, although not to the level of his baseline. Wife states he has lost 3 pounds since Monday. Patient was seen in our office on Monday and received fluids with the assumption that he may have some sort of virus.   Spoke to Dr Marin Olp about current symptoms. Dr Marin Olp wants patient to take Imodium per instructions on the box. He also wants patient to follow up with his PCP. Patient has not yet begun treatment here in the office, and his oncologic issues are most likely not the cause of these continued symptoms. The wife understands to call the PCP and have him worked up for continued problems with diarrhea.

## 2014-08-13 ENCOUNTER — Encounter (HOSPITAL_COMMUNITY): Payer: Self-pay

## 2014-08-13 ENCOUNTER — Other Ambulatory Visit (HOSPITAL_COMMUNITY): Payer: Self-pay | Admitting: Hematology & Oncology

## 2014-08-13 ENCOUNTER — Ambulatory Visit (HOSPITAL_COMMUNITY)
Admission: RE | Admit: 2014-08-13 | Discharge: 2014-08-13 | Disposition: A | Discharge status: Home / Self Care | Payer: Medicare Other | Source: Ambulatory Visit | Attending: Hematology & Oncology | Admitting: Hematology & Oncology

## 2014-08-13 VITALS — BP 105/61 | HR 56 | Temp 97.9°F | Resp 18 | Ht 73.0 in | Wt 170.0 lb

## 2014-08-13 DIAGNOSIS — C9000 Multiple myeloma not having achieved remission: Secondary | ICD-10-CM

## 2014-08-13 DIAGNOSIS — D72822 Plasmacytosis: Secondary | ICD-10-CM | POA: Insufficient documentation

## 2014-08-13 DIAGNOSIS — D472 Monoclonal gammopathy: Secondary | ICD-10-CM | POA: Diagnosis not present

## 2014-08-13 DIAGNOSIS — D649 Anemia, unspecified: Secondary | ICD-10-CM | POA: Insufficient documentation

## 2014-08-13 HISTORY — DX: Malignant (primary) neoplasm, unspecified: C80.1

## 2014-08-13 LAB — CBC WITH DIFFERENTIAL/PLATELET
BASOS PCT: 1 % (ref 0–1)
Basophils Absolute: 0 10*3/uL (ref 0.0–0.1)
Eosinophils Absolute: 0.2 10*3/uL (ref 0.0–0.7)
Eosinophils Relative: 2 % (ref 0–5)
HEMATOCRIT: 36.6 % — AB (ref 39.0–52.0)
HEMOGLOBIN: 11.8 g/dL — AB (ref 13.0–17.0)
Lymphocytes Relative: 20 % (ref 12–46)
Lymphs Abs: 1.6 10*3/uL (ref 0.7–4.0)
MCH: 27.6 pg (ref 26.0–34.0)
MCHC: 32.2 g/dL (ref 30.0–36.0)
MCV: 85.7 fL (ref 78.0–100.0)
MONOS PCT: 6 % (ref 3–12)
Monocytes Absolute: 0.5 10*3/uL (ref 0.1–1.0)
Neutro Abs: 5.8 10*3/uL (ref 1.7–7.7)
Neutrophils Relative %: 71 % (ref 43–77)
PLATELETS: 254 10*3/uL (ref 150–400)
RBC: 4.27 MIL/uL (ref 4.22–5.81)
RDW: 13.5 % (ref 11.5–15.5)
WBC: 8 10*3/uL (ref 4.0–10.5)

## 2014-08-13 LAB — BONE MARROW EXAM: Bone Marrow Exam: 569

## 2014-08-13 MED ORDER — MEPERIDINE HCL 50 MG/ML IJ SOLN
50.0000 mg | Freq: Once | INTRAMUSCULAR | Status: AC
Start: 2014-08-13 — End: 2014-08-13
  Administered 2014-08-13: 25 mg via INTRAVENOUS
  Filled 2014-08-13: qty 1

## 2014-08-13 MED ORDER — SODIUM CHLORIDE 0.9 % IV SOLN: Freq: Once | INTRAVENOUS | Status: DC

## 2014-08-13 MED ORDER — MIDAZOLAM HCL 5 MG/ML IJ SOLN
5.0000 mg | Freq: Once | INTRAMUSCULAR | Status: AC
  Administered 2014-08-13: 2.5 mg via INTRAVENOUS
  Filled 2014-08-13: qty 1

## 2014-08-13 NOTE — Sedation Documentation (Signed)
cdi dsg 

## 2014-08-13 NOTE — Sedation Documentation (Signed)
dsg cdi 

## 2014-08-13 NOTE — Sedation Documentation (Signed)
Patient denies pain and is resting comfortably.  

## 2014-08-13 NOTE — Sedation Documentation (Signed)
Family updated as to patient's status.

## 2014-08-13 NOTE — Discharge Instructions (Signed)
Conscious Sedation, Adult, Care After Refer to this sheet in the next few weeks. These instructions provide you with information on caring for yourself after your procedure. Your health care provider may also give you more specific instructions. Your treatment has been planned according to current medical practices, but problems sometimes occur. Call your health care provider if you have any problems or questions after your procedure. WHAT TO EXPECT AFTER THE PROCEDURE  After your procedure:  You may feel sleepy, clumsy, and have poor balance for several hours.  Vomiting may occur if you eat too soon after the procedure. HOME CARE INSTRUCTIONS  Do not participate in any activities where you could become injured for at least 24 hours. Do not:  Drive.  Swim.  Ride a bicycle.  Operate heavy machinery.  Cook.  Use power tools.  Climb ladders.  Work from a high place.  Do not make important decisions or sign legal documents until you are improved.  If you vomit, drink water, juice, or soup when you can drink without vomiting. Make sure you have little or no nausea before eating solid foods.  Only take over-the-counter or prescription medicines for pain, discomfort, or fever as directed by your health care provider.  Make sure you and your family fully understand everything about the medicines given to you, including what side effects may occur.  You should not drink alcohol, take sleeping pills, or take medicines that cause drowsiness for at least 24 hours.  If you smoke, do not smoke without supervision.  If you are feeling better, you may resume normal activities 24 hours after you were sedated.  Keep all appointments with your health care provider. SEEK MEDICAL CARE IF:  Your skin is pale or bluish in color.  You continue to feel nauseous or vomit.  Your pain is getting worse and is not helped by medicine.  You have bleeding or swelling.  You are still sleepy or  feeling clumsy after 24 hours. SEEK IMMEDIATE MEDICAL CARE IF:  You develop a rash.  You have difficulty breathing.  You develop any type of allergic problem.  You have a fever. MAKE SURE YOU:  Understand these instructions.  Will watch your condition.  Will get help right away if you are not doing well or get worse. Document Released: 10/18/2012 Document Reviewed: 10/18/2012 [facility] Patient Information 2015 [place], [place]. This information is not intended to replace advice given to you by your health care provider. Make sure you discuss any questions you have with your health care provider. Bone Marrow Aspiration, Bone Marrow Biopsy Care After Read the instructions outlined below and refer to this sheet in the next few weeks. These discharge instructions provide you with general information on caring for yourself after you leave the hospital. Your caregiver may also give you specific instructions. While your treatment has been planned according to the most current medical practices available, unavoidable complications occasionally occur. If you have any problems or questions after discharge, call your caregiver. FINDING OUT THE RESULTS OF YOUR TEST Not all test results are available during your visit. If your test results are not back during the visit, make an appointment with your caregiver to find out the results. Do not assume everything is normal if you have not heard from your caregiver or the medical facility. It is important for you to follow up on all of your test results.  HOME CARE INSTRUCTIONS  You have had sedation and may be sleepy or dizzy. Your thinking may  not be as clear as usual. For the next 24 hours:  Only take over-the-counter or prescription medicines for pain, discomfort, and or fever as directed by your caregiver.  Do not drink alcohol.  Do not smoke.  Do not drive.  Do not make important legal decisions.  Do not operate heavy machinery.  Do not care  for small children by yourself.  Keep your dressing clean and dry. You may replace dressing with a bandage after 24 hours.  You may take a bath or shower after 24 hours.  Use an ice pack for 20 minutes every 2 hours while awake for pain as needed. SEEK MEDICAL CARE IF:   There is redness, swelling, or increasing pain at the biopsy site.  There is pus coming from the biopsy site.  There is drainage from a biopsy site lasting longer than one day.  An unexplained oral temperature above 102 F (38.9 C) develops. SEEK IMMEDIATE MEDICAL CARE IF:   You develop a rash.  You have difficulty breathing.  You develop any reaction or side effects to medications given. Document Released: 07/17/2004 Document Revised: 03/22/2011 Document Reviewed: 12/26/2007 [facility] Patient Information 2015 [place], [place]. This information is not intended to replace advice given to you by your health care provider. Make sure you discuss any questions you have with your health care provider.

## 2014-08-13 NOTE — Sedation Documentation (Signed)
dsg  Applied by md to left iliac  CDI

## 2014-08-13 NOTE — Sedation Documentation (Signed)
Medication dose calculated and verified for: Keaden Perdue demerol 25mg  versed 2.5 mg

## 2014-08-13 NOTE — Procedures (Signed)
James Marks was brought to the short stay unit at [facility] for a bone marrow biopsy and aspirate. We did this to see if he has myeloma.  He was brought to the short stay unit. He had an IV placed peripherally.  His Mallimpati score is 2. His ASA class is 2.  We did the appropriate timeout procedure that 8 AM.  We then turned onto his right side. He received 5 mg of Versed and 25 mg a Demerol for IV sedation.  5 mL of 1 % lidocaine was infiltrated under the skin down to the periosteum of the left posterior superior iliac crest. A 11 scalpel was used to make an incision into the skin.  Using the combination biopsy and aspirate needle, we obtained i2 bone marrow aspirates. I then obtain an excellent bone marrow biopsy core.  The patient tolerated the procedure well.  I cleaned and dressed the procedure site sterilely.  I talked to his wife and son.  As always, I appreciate the excellent help that I had during the procedure.  Laurey Arrow

## 2014-08-21 ENCOUNTER — Telehealth: Payer: Self-pay | Admitting: *Deleted

## 2014-08-21 LAB — TISSUE HYBRIDIZATION (BONE MARROW)-NCBH

## 2014-08-21 LAB — CHROMOSOME ANALYSIS, BONE MARROW

## 2014-08-21 NOTE — Telephone Encounter (Signed)
Called patient's wife to notifiy her that patient chromosome testing came back without mutations. Advised her that Dr Marin Olp would be requesting a follow up appointment in the next few weeks, but that patient didn't require treatment and would be under observation only (per Dr Marin Olp). Wife was in understanding and will wait to hear to set up next appointment.

## 2014-08-26 ENCOUNTER — Ambulatory Visit (HOSPITAL_BASED_OUTPATIENT_CLINIC_OR_DEPARTMENT_OTHER): Payer: Medicare Other | Admitting: Hematology & Oncology

## 2014-08-26 ENCOUNTER — Encounter: Payer: Self-pay | Admitting: Hematology & Oncology

## 2014-08-26 ENCOUNTER — Other Ambulatory Visit (HOSPITAL_BASED_OUTPATIENT_CLINIC_OR_DEPARTMENT_OTHER): Payer: Medicare Other

## 2014-08-26 VITALS — BP 121/57 | HR 64 | Temp 97.3°F | Resp 16 | Ht 73.0 in | Wt 173.0 lb

## 2014-08-26 DIAGNOSIS — C9000 Multiple myeloma not having achieved remission: Secondary | ICD-10-CM

## 2014-08-26 DIAGNOSIS — R1110 Vomiting, unspecified: Secondary | ICD-10-CM

## 2014-08-26 DIAGNOSIS — D472 Monoclonal gammopathy: Secondary | ICD-10-CM

## 2014-08-26 HISTORY — DX: Monoclonal gammopathy: D47.2

## 2014-08-26 HISTORY — DX: Multiple myeloma not having achieved remission: C90.00

## 2014-08-26 LAB — CBC WITH DIFFERENTIAL (CANCER CENTER ONLY)
BASO#: 0.1 10*3/uL (ref 0.0–0.2)
BASO%: 0.8 % (ref 0.0–2.0)
EOS ABS: 0.2 10*3/uL (ref 0.0–0.5)
EOS%: 2 % (ref 0.0–7.0)
HCT: 39.9 % (ref 38.7–49.9)
HEMOGLOBIN: 13 g/dL (ref 13.0–17.1)
LYMPH#: 1.5 10*3/uL (ref 0.9–3.3)
LYMPH%: 20.3 % (ref 14.0–48.0)
MCH: 28.5 pg (ref 28.0–33.4)
MCHC: 32.6 g/dL (ref 32.0–35.9)
MCV: 88 fL (ref 82–98)
MONO#: 0.5 10*3/uL (ref 0.1–0.9)
MONO%: 6.8 % (ref 0.0–13.0)
NEUT%: 70.1 % (ref 40.0–80.0)
NEUTROS ABS: 5.3 10*3/uL (ref 1.5–6.5)
Platelets: 230 10*3/uL (ref 145–400)
RBC: 4.56 10*6/uL (ref 4.20–5.70)
RDW: 13.5 % (ref 11.1–15.7)
WBC: 7.6 10*3/uL (ref 4.0–10.0)

## 2014-08-26 LAB — COMPREHENSIVE METABOLIC PANEL
ALBUMIN: 3.8 g/dL (ref 3.6–5.1)
ALK PHOS: 101 U/L (ref 40–115)
ALT: 13 U/L (ref 9–46)
AST: 12 U/L (ref 10–35)
BUN: 24 mg/dL (ref 7–25)
CHLORIDE: 103 mmol/L (ref 98–110)
CO2: 28 mmol/L (ref 20–31)
Calcium: 9.3 mg/dL (ref 8.6–10.3)
Creatinine, Ser: 1.68 mg/dL — ABNORMAL HIGH (ref 0.70–1.18)
Glucose, Bld: 102 mg/dL — ABNORMAL HIGH (ref 65–99)
Potassium: 4.2 mmol/L (ref 3.5–5.3)
Sodium: 140 mmol/L (ref 135–146)
TOTAL PROTEIN: 7.4 g/dL (ref 6.1–8.1)
Total Bilirubin: 0.4 mg/dL (ref 0.2–1.2)

## 2014-08-26 NOTE — Progress Notes (Signed)
Hematology and Oncology Follow Up Visit  James Marks [id number] 25-May-1940 73 y.o. 08/26/2014   Principle Diagnosis:   IgA kappa smoldering myeloma-normal chromosomes  Current Therapy:    observation     Interim History:  James Marks is back for follow-up. This is his third office visit. We did go ahead and do a bone marrow biopsy on him. This is done on August 2. The pathology report ([id number]) showed a slightly hypercellular marrow with 10% plasma cells. There were no sheets of plasma cells. Chromosome studies showed normal chromosome material.  We did do a 24-hour urine on him. He had 72 mg of Kappa Lightchain in his urine.  His serum studies showed 1100 mg/dL of IgA protein.his Kappa Lightchain was 9.6 mg/dL.  He's feeling okay. He has not had any issues since we last saw him. He was having some problems with diarrhea. I think this upon more so of what he ate or possibly a viral gastroenteritis.  We did do a bone survey on him. This was normal.  I would have to think that he probably has smoldering myeloma. This is some that we will have to follow.  Currently, his performance status is ECOG 1.  Medications:  Current outpatient prescriptions:  .  lisinopril-hydrochlorothiazide (PRINZIDE,ZESTORETIC) 10-12.5 MG per tablet, Take 1 tablet by mouth daily., Disp: 90 tablet, Rfl: 2 .  Probiotic Product (ALIGN) 4 MG CAPS, Take 4 mg by mouth daily., Disp: , Rfl:  .  tamsulosin (FLOMAX) 0.4 MG CAPS capsule, Take 1 capsule (0.4 mg total) by mouth daily. (Patient not taking: Reported on 08/12/2014), Disp: 30 capsule, Rfl: 6 No current facility-administered medications for this visit.  Facility-Administered Medications Ordered in Other Visits:  .  0.9 %  sodium chloride infusion, , Intravenous, Continuous, [name], MD, Stopped at 08/05/14 1710  Allergies: No Known Allergies  Past Medical History, Surgical history, Social history, and Family History were reviewed and  updated.  Review of Systems: As above  Physical Exam:  height is 6' 1"  (1.854 m) and weight is 173 lb (78.472 kg). His oral temperature is 97.3 F (36.3 C). His blood pressure is 121/57 and his pulse is 64. His respiration is 16.   Wt Readings from Last 3 Encounters:  08/26/14 173 lb (78.472 kg)  08/13/14 170 lb (77.111 kg)  08/05/14 170 lb (77.111 kg)     Elderly, thin white gentleman in no obvious distress. Head and neck exam shows no ocular or oral lesions. He has no palpable cervical or supraclavicular lymph nodes. Lungs are clear. Cardiac exam regular rate and rhythm with no murmurs, rubs or bruits. Abdomen is soft. He has good bowel sounds. There is no fluid wave. There is no palpable liver or spleen tip. Back exam shows no tenderness over the spine, ribs or hips. Extremities shows no clubbing, cyanosis or edema. Neurological exam shows no focal neurological deficits. Skin exam shows no rashes, ecchymoses or petechia.  Lab Results  Component Value Date   WBC 7.6 08/26/2014   HGB 13.0 08/26/2014   HCT 39.9 08/26/2014   MCV 88 08/26/2014   PLT 230 08/26/2014     Chemistry      Component Value Date/Time   NA 140 08/05/2014 1454   NA 142 03/29/2014 0000   K 4.5 08/05/2014 1454   K 5.6* 03/29/2014 0000   CL 109* 08/05/2014 1454   CL 105 03/29/2014 0000   CO2 23 08/05/2014 1454   CO2 28 03/29/2014 0000  BUN 29* 08/05/2014 1454   BUN 27* 03/29/2014 0000   CREATININE 2.0* 08/05/2014 1454   CREATININE 1.8* 10/05/2013 0922      Component Value Date/Time   CALCIUM 9.3 08/05/2014 1454   CALCIUM 9.5 03/29/2014 0000   ALKPHOS 104* 08/05/2014 1454   ALKPHOS 94 10/04/2011 0953   AST 21 08/05/2014 1454   AST 17 10/04/2011 0953   ALT 17 08/05/2014 1454   ALT 14 10/04/2011 0953   BILITOT 0.70 08/05/2014 1454   BILITOT 0.4 10/04/2011 0953         Impression and Plan: James Marks is 74 year old woman with IgA kappa smoldering myeloma. Again, I think he is borderline with  smoldering myeloma versus MGUS.  He still has the neuropathy but this really is not bothering him that much. I still do not know if this is anything related to the smoldering myeloma.  I think that we probably should follow him up in about 4 months time.  I spent about 35-40 minutes with he and his wife. I went over all the labs and pathology reports.  I told him that I thought the chance of him needing therapy for this or that a goes into actual myeloma will be less than 10%.   [name], MD 8/15/201612:43 PM

## 2014-08-28 ENCOUNTER — Encounter (HOSPITAL_COMMUNITY): Payer: Self-pay

## 2014-09-02 ENCOUNTER — Encounter: Payer: Self-pay | Admitting: Family Medicine

## 2014-09-02 ENCOUNTER — Ambulatory Visit (INDEPENDENT_AMBULATORY_CARE_PROVIDER_SITE_OTHER): Payer: Medicare Other | Admitting: Family Medicine

## 2014-09-02 VITALS — BP 120/70 | HR 77 | Temp 97.8°F | Wt 176.0 lb

## 2014-09-02 DIAGNOSIS — M10072 Idiopathic gout, left ankle and foot: Secondary | ICD-10-CM

## 2014-09-02 MED ORDER — PREDNISONE 10 MG PO TABS: ORAL_TABLET | ORAL | Status: DC

## 2014-09-02 NOTE — Progress Notes (Signed)
Pre visit review using our clinic review tool, if applicable. No additional management support is needed unless otherwise documented below in the visit note. 

## 2014-09-02 NOTE — Patient Instructions (Signed)

## 2014-09-02 NOTE — Progress Notes (Signed)
   Subjective:    Patient ID: James Marks, male    DOB: 1940/11/02, 74 y.o.   MRN: [id number]  HPI Acute visit. Pain, swelling, redness left great toe metatarsophalangeal joint. Very similar occurrence back in April of this year treated as acute gout with prednisone and symptoms promptly resolved. No recent injury. No fevers or chills. Took a couple of ibuprofen over the weekend without much improvement. Pain is moderate. Worse with ambulation.  Denies any other joint involvement. He has had very infrequent flareups.  Past Medical History  Diagnosis Date  . Hx of colonic polyps   . Diverticulitis   . Hypertension   . Osteopenia   . Hearing loss   . Kidney stones   . Hyperlipidemia   . Subarachnoid hemorrhage 2/01    nontraumatic  . Cancer     myeloma  . Smoldering multiple myeloma 08/26/2014   Past Surgical History  Procedure Laterality Date  . None      reports that he quit smoking about 2 years ago. His smoking use included Cigarettes. He started smoking about 61 years ago. He has a 44 pack-year smoking history. He quit smokeless tobacco use about 22 months ago. He reports that he drinks alcohol. He reports that he does not use illicit drugs. family history includes Cancer in his mother; Diabetes in his mother and another family member; Diabetes Mellitus II in his brother; Hypertension in his father; Macular degeneration in his brother. No Known Allergies    Review of Systems  Constitutional: Negative for fever and chills.  Skin: Negative for rash.       Objective:   Physical Exam  Constitutional: He appears well-developed and well-nourished. No distress.  Cardiovascular: Normal rate and regular rhythm.   Pulmonary/Chest: Effort normal and breath sounds normal. No respiratory distress. He has no wheezes. He has no rales.  Musculoskeletal:  Left great toe reveals minimal swelling redness or tenderness metatarsophalangeal joint. No open breaks in the skin. No signs of  trauma such as ecchymosis.          Assessment & Plan:  Acute gout involving left great toe. Prednisone taper. Discussed possible check uric acid when not having acute flare but at this point he is not interested in prophylactic medications. Discussed possible triggers for gout. Stay well-hydrated. Consider DC HCTZ if continues to have frequent flareups

## 2014-09-17 ENCOUNTER — Other Ambulatory Visit: Payer: Self-pay | Admitting: Hematology & Oncology

## 2014-09-17 ENCOUNTER — Encounter: Payer: Self-pay | Admitting: *Deleted

## 2014-09-17 DIAGNOSIS — F064 Anxiety disorder due to known physiological condition: Secondary | ICD-10-CM

## 2014-09-17 MED ORDER — ALPRAZOLAM 0.25 MG PO TABS: 0.2500 mg | ORAL_TABLET | Freq: Three times a day (TID) | ORAL | Status: DC | PRN

## 2014-10-11 ENCOUNTER — Encounter: Payer: Medicare Other | Admitting: Family Medicine

## 2014-10-17 ENCOUNTER — Ambulatory Visit (INDEPENDENT_AMBULATORY_CARE_PROVIDER_SITE_OTHER): Payer: Medicare Other | Admitting: Family Medicine

## 2014-10-17 VITALS — BP 120/70 | HR 94 | Temp 97.4°F | Ht 72.84 in | Wt 172.8 lb

## 2014-10-17 DIAGNOSIS — Z0000 Encounter for general adult medical examination without abnormal findings: Secondary | ICD-10-CM | POA: Diagnosis not present

## 2014-10-17 DIAGNOSIS — N183 Chronic kidney disease, stage 3 unspecified: Secondary | ICD-10-CM

## 2014-10-17 DIAGNOSIS — Z23 Encounter for immunization: Secondary | ICD-10-CM | POA: Diagnosis not present

## 2014-10-17 DIAGNOSIS — I10 Essential (primary) hypertension: Secondary | ICD-10-CM

## 2014-10-17 NOTE — Progress Notes (Signed)
 Subjective:    Patient ID: James Marks, male    DOB: 03/17/1940, 74 y.o.   MRN: [id number]  HPI   Patient seen for Medicare wellness exam and medical follow-up. He takes lisinopril HCTZ for hypertension. Blood pressures have been well controlled. 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 He is followed closely by oncology. He has chronic kidney disease stage III and has scheduled follow-up labs with oncology in one month. He's had mild dyslipidemia in the past. No history of CAD or peripheral vascular disease. Quit smoking several years ago.  Past Medical History  Diagnosis Date  . Hx of colonic polyps   . Diverticulitis   . Hypertension   . Osteopenia   . Hearing loss   . Kidney stones   . Hyperlipidemia   . Subarachnoid hemorrhage 2/01    nontraumatic  . Cancer     myeloma  . Smoldering multiple myeloma 08/26/2014   Past Surgical History  Procedure Laterality Date  . None      reports that he quit smoking about 2 years ago. His smoking use included Cigarettes. He started smoking about 62 years ago. He has a 44 pack-year smoking history. He quit smokeless tobacco use about 2 years ago. He reports that he drinks alcohol. He reports that he does not use illicit drugs. family history includes Cancer in his mother; Diabetes in his mother and another family member; Diabetes Mellitus II in his brother; Hypertension in his father; Macular degeneration in his brother. No Known Allergies  1.  Risk factors based on Past Medical , Social, and Family history reviewed and as indicated above with no changes 2.  Limitations in physical activities None.  No recent falls. 3.  Depression/mood No active depression or anxiety issues 4.  Hearing chronic bilateral loss. He has bilateral hearing aids 5.  ADLs independent in all. 6.  Cognitive function (orientation to time and place, language, writing,  speech,memory) no short or long term memory issues.  Language and judgement intact. 7.  Home Safety no issues 8.  Height, weight, and visual acuity.all stable. 9.  Counseling discussed importance of regular weightbearing exercise such as walking 10. Recommendation of preventive services. Flu vaccine and Prevnar 13 given 11. Labs based on risk factors none today. He is getting regular labs with oncology 12. Care Plan as above 13. Other Providers Dr Ennever, Oncology 14. Written schedule of screening/prevention services given to patient.    Review of Systems  Constitutional: Negative for fever, chills, activity change, appetite change, fatigue and unexpected weight change.  HENT: Positive for hearing loss. Negative for congestion, ear pain and trouble swallowing.   Eyes: Negative for pain and visual disturbance.  Respiratory: Negative for cough, shortness of breath and wheezing.   Cardiovascular: Negative for chest pain and palpitations.  Gastrointestinal: Negative for nausea, vomiting, abdominal pain, diarrhea, constipation, blood in stool, abdominal distention and rectal pain.  Endocrine: Negative for polydipsia and polyuria.  Genitourinary: Negative for dysuria, hematuria and testicular pain.  Musculoskeletal: Positive for arthralgias. Negative for joint swelling.  Skin: Negative for rash.  Neurological: Positive for numbness (feet). Negative for dizziness, syncope and headaches.  Hematological: Negative for adenopathy.  Psychiatric/Behavioral: Negative for confusion and dysphoric mood.       Objective:   Physical Exam  Constitutional: He is oriented to person, place, and time. He appears well-developed and well-nourished. No distress.  HENT:  Right Ear: External   ear normal.  Left Ear: External ear normal.  Mouth/Throat: Oropharynx is clear and moist.  Neck: Neck supple. No thyromegaly present.  Cardiovascular: Normal rate and regular rhythm.   Pulmonary/Chest: Effort normal.  No respiratory distress. He has no wheezes. He has no rales.  Abdominal: Soft. Bowel sounds are normal. He exhibits no distension. There is no tenderness. There is no rebound.  Musculoskeletal: He exhibits no edema.  Lymphadenopathy:    He has no cervical adenopathy.  Neurological: He is alert and oriented to person, place, and time. No cranial nerve deficit.  Skin: No rash noted.  Psychiatric: He has a normal mood and affect. His behavior is normal.          Assessment & Plan:  #1 Medicare wellness exam. Flu vaccine given. Prevnar 13 given. We deferred any labs since he plans see oncologist soon. Continue yearly flu vaccine. Tetanus booster next year. Colonoscopy up-to-date. #2 hypertension stable and at goal. He will call for refills when due. #3 chronic kidney disease stage III.  Continue good blood pressure control. Avoid nonsterile. Stay well-hydrated. 

## 2014-10-17 NOTE — Patient Instructions (Signed)
Continue with yearly flu vaccine You'll need tetanus booster by next year. You received Prevnar 13 pneumonia vaccine today which completes pneumonia vaccination

## 2014-10-17 NOTE — Addendum Note (Signed)
Addended by: Ailene Rud E on: 10/17/2014 10:37 AM   Modules accepted: Orders

## 2014-10-17 NOTE — Progress Notes (Signed)
Pre visit review using our clinic review tool, if applicable. No additional management support is needed unless otherwise documented below in the visit note. 

## 2014-12-23 ENCOUNTER — Ambulatory Visit: Payer: Medicare Other

## 2014-12-23 DIAGNOSIS — D472 Monoclonal gammopathy: Secondary | ICD-10-CM

## 2014-12-23 DIAGNOSIS — C9000 Multiple myeloma not having achieved remission: Secondary | ICD-10-CM

## 2014-12-27 ENCOUNTER — Ambulatory Visit (HOSPITAL_BASED_OUTPATIENT_CLINIC_OR_DEPARTMENT_OTHER): Payer: Medicare Other

## 2014-12-27 ENCOUNTER — Ambulatory Visit (HOSPITAL_BASED_OUTPATIENT_CLINIC_OR_DEPARTMENT_OTHER): Payer: Medicare Other | Admitting: Hematology & Oncology

## 2014-12-27 ENCOUNTER — Encounter: Payer: Self-pay | Admitting: Hematology & Oncology

## 2014-12-27 VITALS — BP 126/66 | HR 65 | Temp 97.3°F | Wt 176.0 lb

## 2014-12-27 DIAGNOSIS — D472 Monoclonal gammopathy: Secondary | ICD-10-CM

## 2014-12-27 DIAGNOSIS — C9000 Multiple myeloma not having achieved remission: Secondary | ICD-10-CM

## 2014-12-27 DIAGNOSIS — G629 Polyneuropathy, unspecified: Secondary | ICD-10-CM | POA: Diagnosis not present

## 2014-12-27 LAB — CBC WITH DIFFERENTIAL (CANCER CENTER ONLY)
BASO#: 0 10*3/uL (ref 0.0–0.2)
BASO%: 0.6 % (ref 0.0–2.0)
EOS ABS: 0.1 10*3/uL (ref 0.0–0.5)
EOS%: 1.7 % (ref 0.0–7.0)
HCT: 41.7 % (ref 38.7–49.9)
HGB: 13.4 g/dL (ref 13.0–17.1)
LYMPH#: 1.3 10*3/uL (ref 0.9–3.3)
LYMPH%: 20.7 % (ref 14.0–48.0)
MCH: 27.6 pg — AB (ref 28.0–33.4)
MCHC: 32.1 g/dL (ref 32.0–35.9)
MCV: 86 fL (ref 82–98)
MONO#: 0.5 10*3/uL (ref 0.1–0.9)
MONO%: 7.7 % (ref 0.0–13.0)
NEUT#: 4.5 10*3/uL (ref 1.5–6.5)
NEUT%: 69.3 % (ref 40.0–80.0)
PLATELETS: 226 10*3/uL (ref 145–400)
RBC: 4.85 10*6/uL (ref 4.20–5.70)
RDW: 13.2 % (ref 11.1–15.7)
WBC: 6.5 10*3/uL (ref 4.0–10.0)

## 2014-12-27 LAB — COMPREHENSIVE METABOLIC PANEL
ANION GAP: 8 meq/L (ref 3–11)
AST: 13 U/L (ref 5–34)
Albumin: 3.6 g/dL (ref 3.5–5.0)
Alkaline Phosphatase: 116 U/L (ref 40–150)
BUN: 26 mg/dL (ref 7.0–26.0)
CHLORIDE: 105 meq/L (ref 98–109)
CO2: 29 meq/L (ref 22–29)
CREATININE: 1.9 mg/dL — AB (ref 0.7–1.3)
Calcium: 9.1 mg/dL (ref 8.4–10.4)
EGFR: 35 mL/min/{1.73_m2} — AB (ref >90)
GLUCOSE: 103 mg/dL (ref 70–140)
Potassium: 4.3 mEq/L (ref 3.5–5.1)
SODIUM: 141 meq/L (ref 136–145)
Total Bilirubin: 0.35 mg/dL (ref 0.20–1.20)
Total Protein: 7.5 g/dL (ref 6.4–8.3)

## 2014-12-27 NOTE — Progress Notes (Signed)
Hematology and Oncology Follow Up Visit  James Marks:[id number] Oct 25, 1940 74 y.o. 12/27/2014   Principle Diagnosis:   IgA kappa smoldering myeloma-normal chromosomes  Current Therapy:    observation     Interim History:  James Marks is back for follow-up.   Last saw him back in August. Since then, he's been doing pretty well. He's been playing golf. The neuropathy has not been all that bad. It still is in his toes but certainly has not gotten any worse.  He's feeling okay. He has not had any other issues since we last saw him. He was having some problems with diarrhea. I think this upon more so of what he ate or possibly a viral gastroenteritis.  He had a very good Thanksgiving. He has wife are staying home for Christmas.  Currently, his performance status is ECOG 1.  Medications:  Current outpatient prescriptions:  .  lisinopril-hydrochlorothiazide (PRINZIDE,ZESTORETIC) 10-12.5 MG per tablet, Take 1 tablet by mouth daily., Disp: 90 tablet, Rfl: 2 No current facility-administered medications for this visit.  Facility-Administered Medications Ordered in Other Visits:  .  0.9 %  sodium chloride infusion, , Intravenous, Continuous, [name], MD, Stopped at 08/05/14 1710  Allergies: No Known Allergies  Past Medical History, Surgical history, Social history, and Family History were reviewed and updated.  Review of Systems: As above  Physical Exam:  weight is 176 lb (79.833 kg). His oral temperature is 97.3 F (36.3 C). His blood pressure is 126/66 and his pulse is 65.   Wt Readings from Last 3 Encounters:  12/27/14 176 lb (79.833 kg)  10/17/14 172 lb 12.8 oz (78.382 kg)  09/02/14 176 lb (79.833 kg)     Elderly, thin white gentleman in no obvious distress. Head and neck exam shows no ocular or oral lesions. He has no palpable cervical or supraclavicular lymph nodes. Lungs are clear. Cardiac exam regular rate and rhythm with no murmurs, rubs or bruits. Abdomen  is soft. He has good bowel sounds. There is no fluid wave. There is no palpable liver or spleen tip. Back exam shows no tenderness over the spine, ribs or hips. Extremities shows no clubbing, cyanosis or edema. Neurological exam shows no focal neurological deficits. Skin exam shows no rashes, ecchymoses or petechia.  Lab Results  Component Value Date   WBC 6.5 12/27/2014   HGB 13.4 12/27/2014   HCT 41.7 12/27/2014   MCV 86 12/27/2014   PLT 226 12/27/2014     Chemistry      Component Value Date/Time   NA 140 08/26/2014 1144   NA 140 08/05/2014 1454   K 4.2 08/26/2014 1144   K 4.5 08/05/2014 1454   CL 103 08/26/2014 1144   CL 109* 08/05/2014 1454   CO2 28 08/26/2014 1144   CO2 23 08/05/2014 1454   BUN 24 08/26/2014 1144   BUN 29* 08/05/2014 1454   CREATININE 1.68* 08/26/2014 1144   CREATININE 2.0* 08/05/2014 1454      Component Value Date/Time   CALCIUM 9.3 08/26/2014 1144   CALCIUM 9.3 08/05/2014 1454   ALKPHOS 101 08/26/2014 1144   ALKPHOS 104* 08/05/2014 1454   AST 12 08/26/2014 1144   AST 21 08/05/2014 1454   ALT 13 08/26/2014 1144   ALT 17 08/05/2014 1454   BILITOT 0.4 08/26/2014 1144   BILITOT 0.70 08/05/2014 1454         Impression and Plan: Mr. Dendinger is 74 year old woman with IgA kappa smoldering myeloma. Again, I think he  is borderline with smoldering myeloma versus MGUS.  He still has the neuropathy but this really is not bothering him that much. I still do not know if this is anything related to the smoldering myeloma.  I think that we probably should follow him up in about 6 months time.  I spent about 35-40 minutes with he and his wife. I went over all the labs and pathology reports.  I told him that I thought the chance of him needing therapy for this or that a goes into actual myeloma will be less than 10%.   [name], MD 12/16/201612:54 PM

## 2015-01-01 LAB — IFE INTERPRETATION

## 2015-01-01 LAB — KAPPA/LAMBDA LIGHT CHAINS
KAPPA LAMBDA RATIO: 2.88 — AB (ref 0.26–1.65)
Kappa free light chain: 7.74 mg/dL — ABNORMAL HIGH (ref 0.33–1.94)
Lambda Free Lght Chn: 2.69 mg/dL — ABNORMAL HIGH (ref 0.57–2.63)

## 2015-01-01 LAB — IGG, IGA, IGM
IGA: 1030 mg/dL — AB (ref 68–379)
IGM, SERUM: 83 mg/dL (ref 41–251)
IgG (Immunoglobin G), Serum: 1080 mg/dL (ref 650–1600)

## 2015-01-01 LAB — PROTEIN ELECTROPHORESIS, SERUM, WITH REFLEX
ABNORMAL PROTEIN BAND1: 0.5 g/dL
ABNORMAL PROTEIN BAND2: 0.2 g/dL
ABNORMAL PROTEIN BAND3: NOT DETECTED g/dL
ALBUMIN ELP: 3.7 g/dL — AB (ref 3.8–4.8)
ALPHA-2-GLOBULIN: 0.6 g/dL (ref 0.5–0.9)
Alpha-1-Globulin: 0.3 g/dL (ref 0.2–0.3)
Beta 2: 0.9 g/dL — ABNORMAL HIGH (ref 0.2–0.5)
Beta Globulin: 0.4 g/dL (ref 0.4–0.6)
Gamma Globulin: 1.1 g/dL (ref 0.8–1.7)
Total Protein, Serum Electrophoresis: 7 g/dL (ref 6.1–8.1)

## 2015-01-04 LAB — 24 HR URINE,KAPPA/LAMBDA LIGHT CHAINS
24H Urine Volume: 2300 mL/24 h
MEASURED KAPPA CHAIN: 3.24 mg/dL — AB (ref <2.00)
Measured Lambda Chain: <0.4 mg/dL (ref <2.00)
TOTAL KAPPA CHAIN: 74.52 mg/(24.h)

## 2015-01-04 LAB — UIFE/LIGHT CHAINS/TP QN, 24-HR UR
ALPHA 1 UR: DETECTED — AB
ALPHA 2 UR: DETECTED — AB
Albumin, U: DETECTED
BETA UR: DETECTED — AB
GAMMA UR: DETECTED — AB
TOTAL PROTEIN, URINE-UPE24: 9 mg/dL (ref 5–25)
Time: 24 hours
Total Protein, Urine-Ur/day: 207 mg/d — ABNORMAL HIGH (ref <150)
VOLUME, URINE-UPE24: 2300 mL

## 2015-01-12 ENCOUNTER — Other Ambulatory Visit: Payer: Self-pay | Admitting: Family Medicine

## 2015-01-22 ENCOUNTER — Telehealth: Payer: Self-pay | Admitting: Family Medicine

## 2015-01-22 NOTE — Telephone Encounter (Signed)
Pt needs refill on lisinopril=hctz #90 w/refills [facility]

## 2015-01-23 NOTE — Telephone Encounter (Signed)
Refilled 01/14/15

## 2015-05-07 DIAGNOSIS — H2513 Age-related nuclear cataract, bilateral: Secondary | ICD-10-CM | POA: Diagnosis not present

## 2015-07-01 ENCOUNTER — Other Ambulatory Visit (HOSPITAL_BASED_OUTPATIENT_CLINIC_OR_DEPARTMENT_OTHER): Payer: Medicare Other

## 2015-07-01 ENCOUNTER — Other Ambulatory Visit: Payer: Self-pay | Admitting: *Deleted

## 2015-07-01 ENCOUNTER — Ambulatory Visit (HOSPITAL_BASED_OUTPATIENT_CLINIC_OR_DEPARTMENT_OTHER): Payer: Medicare Other | Admitting: Hematology & Oncology

## 2015-07-01 VITALS — BP 129/58 | HR 66 | Temp 97.6°F | Wt 177.8 lb

## 2015-07-01 DIAGNOSIS — C9000 Multiple myeloma not having achieved remission: Secondary | ICD-10-CM | POA: Diagnosis not present

## 2015-07-01 DIAGNOSIS — G629 Polyneuropathy, unspecified: Secondary | ICD-10-CM | POA: Diagnosis not present

## 2015-07-01 DIAGNOSIS — D472 Monoclonal gammopathy: Secondary | ICD-10-CM

## 2015-07-01 LAB — COMPREHENSIVE METABOLIC PANEL
ALT: 12 U/L (ref 0–55)
ANION GAP: 7 meq/L (ref 3–11)
AST: 13 U/L (ref 5–34)
Albumin: 3.5 g/dL (ref 3.5–5.0)
Alkaline Phosphatase: 115 U/L (ref 40–150)
BILIRUBIN TOTAL: 0.36 mg/dL (ref 0.20–1.20)
BUN: 31.1 mg/dL — ABNORMAL HIGH (ref 7.0–26.0)
CO2: 27 meq/L (ref 22–29)
CREATININE: 2 mg/dL — AB (ref 0.7–1.3)
Calcium: 9.5 mg/dL (ref 8.4–10.4)
Chloride: 104 mEq/L (ref 98–109)
EGFR: 32 mL/min/{1.73_m2} — ABNORMAL LOW (ref >90)
GLUCOSE: 140 mg/dL (ref 70–140)
Potassium: 4.9 mEq/L (ref 3.5–5.1)
SODIUM: 138 meq/L (ref 136–145)
TOTAL PROTEIN: 8 g/dL (ref 6.4–8.3)

## 2015-07-01 LAB — CBC WITH DIFFERENTIAL (CANCER CENTER ONLY)
BASO#: 0 10*3/uL (ref 0.0–0.2)
BASO%: 0.6 % (ref 0.0–2.0)
EOS%: 1.9 % (ref 0.0–7.0)
Eosinophils Absolute: 0.1 10*3/uL (ref 0.0–0.5)
HCT: 42 % (ref 38.7–49.9)
HGB: 13.7 g/dL (ref 13.0–17.1)
LYMPH#: 1.4 10*3/uL (ref 0.9–3.3)
LYMPH%: 20.6 % (ref 14.0–48.0)
MCH: 28.4 pg (ref 28.0–33.4)
MCHC: 32.6 g/dL (ref 32.0–35.9)
MCV: 87 fL (ref 82–98)
MONO#: 0.5 10*3/uL (ref 0.1–0.9)
MONO%: 7.1 % (ref 0.0–13.0)
NEUT%: 69.8 % (ref 40.0–80.0)
NEUTROS ABS: 4.8 10*3/uL (ref 1.5–6.5)
PLATELETS: 233 10*3/uL (ref 145–400)
RBC: 4.83 10*6/uL (ref 4.20–5.70)
RDW: 13.5 % (ref 11.1–15.7)
WBC: 6.9 10*3/uL (ref 4.0–10.0)

## 2015-07-01 NOTE — Progress Notes (Signed)
Hematology and Oncology Follow Up Visit  James Marks [id number] August 03, 1940 75 y.o. 07/01/2015   Principle Diagnosis:   IgA kappa smoldering myeloma-normal chromosomes  Current Therapy:    observation     Interim History:  James Marks is back for follow-up.   Last saw James Marks back in December. Since then, James Marks's been doing pretty well. James Marks is playing golf. James Marks has wife discovered back from [facility]. James Marks had a great time down there.  James Marks monoclonal studies have been doing reasonably stable. James Marks last monoclonal spike was too small spikes. One was 0.5 g/L. The other spike was 0.2 g/L. We will have to watch this. James Marks IgA level was 1030 mg/dL. James Marks Kappa Lightchain was 7.74 mg/dL. All this is holding steady.  James Marks has had no problem with infections. James Marks has had no issues with bowels or bladder. James Marks has had no problems with joint issues. James Marks's when I complaining much of the neuropathy.   James Marks appetite has been good. James Marks has had no nausea or vomiting.  Currently, James Marks performance status is ECOG 1.  Medications:  Current outpatient prescriptions:  .  lisinopril-hydrochlorothiazide (PRINZIDE,ZESTORETIC) 10-12.5 MG tablet, TAKE 1 TABLET DAILY, Disp: 90 tablet, Rfl: 2 No current facility-administered medications for this visit.  Facility-Administered Medications Ordered in Other Visits:  .  0.9 %  sodium chloride infusion, , Intravenous, Continuous, [name], MD, Stopped at 08/05/14 1710  Allergies: No Known Allergies  Past Medical History, Surgical history, Social history, and Family History were reviewed and updated.  Review of Systems: As above  Physical Exam:  weight is 177 lb 12.8 oz (80.65 kg). James Marks temperature is 97.6 F (36.4 C). James Marks blood pressure is 129/58 and James Marks pulse is 66.   Wt Readings from Last 3 Encounters:  07/01/15 177 lb 12.8 oz (80.65 kg)  12/27/14 176 lb (79.833 kg)  10/17/14 172 lb 12.8 oz (78.382 kg)     Elderly, thin white gentleman in no obvious  distress. Head and neck exam shows no ocular or oral lesions. James Marks has no palpable cervical or supraclavicular lymph nodes. Lungs are clear. Cardiac exam regular rate and rhythm with no murmurs, rubs or bruits. Abdomen is soft. James Marks has good bowel sounds. There is no fluid wave. There is no palpable liver or spleen tip. Back exam shows no tenderness over the spine, ribs or hips. Extremities shows no clubbing, cyanosis or edema. Neurological exam shows no focal neurological deficits. Skin exam shows no rashes, ecchymoses or petechia.  Lab Results  Component Value Date   WBC 6.9 07/01/2015   HGB 13.7 07/01/2015   HCT 42.0 07/01/2015   MCV 87 07/01/2015   PLT 233 07/01/2015     Chemistry      Component Value Date/Time   NA 141 12/27/2014 1158   NA 140 08/26/2014 1144   NA 140 08/05/2014 1454   K 4.3 12/27/2014 1158   K 4.2 08/26/2014 1144   K 4.5 08/05/2014 1454   CL 103 08/26/2014 1144   CL 109* 08/05/2014 1454   CO2 29 12/27/2014 1158   CO2 28 08/26/2014 1144   CO2 23 08/05/2014 1454   BUN 26.0 12/27/2014 1158   BUN 24 08/26/2014 1144   BUN 29* 08/05/2014 1454   CREATININE 1.9* 12/27/2014 1158   CREATININE 1.68* 08/26/2014 1144   CREATININE 2.0* 08/05/2014 1454      Component Value Date/Time   CALCIUM 9.1 12/27/2014 1158   CALCIUM 9.3 08/26/2014 1144   CALCIUM 9.3 08/05/2014  1454   ALKPHOS 116 12/27/2014 1158   ALKPHOS 101 08/26/2014 1144   ALKPHOS 104* 08/05/2014 1454   AST 13 12/27/2014 1158   AST 12 08/26/2014 1144   AST 21 08/05/2014 1454   ALT <9 12/27/2014 1158   ALT 13 08/26/2014 1144   ALT 17 08/05/2014 1454   BILITOT 0.35 12/27/2014 1158   BILITOT 0.4 08/26/2014 1144   BILITOT 0.70 08/05/2014 1454         Impression and Plan: James Marks is 75 year old man with IgA kappa smoldering myeloma. Again, I think James Marks is borderline with smoldering myeloma versus MGUS.  James Marks still has the neuropathy but this really is not bothering James Marks that much. I still do not know if  this is anything related to the smoldering myeloma.  I think that we probably should follow James Marks up in about 6 months time.  I spent about 25 minutes with James Marks and James Marks wife. I went over all the labs.  I told James Marks that I thought the chance of James Marks needing therapy for this or that a goes into actual myeloma will be less than 10%.   [name], MD 6/20/201711:24 AM

## 2015-07-02 LAB — IGG, IGA, IGM: IgM, Qn, Serum: 87 mg/dL (ref 15–143)

## 2015-07-02 LAB — KAPPA/LAMBDA LIGHT CHAINS
Ig Kappa Free Light Chain: 105.3 mg/L — ABNORMAL HIGH (ref 3.3–19.4)
Ig Lambda Free Light Chain: 25.6 mg/L (ref 5.7–26.3)
Kappa/Lambda FluidC Ratio: 4.11 — ABNORMAL HIGH (ref 0.26–1.65)

## 2015-07-04 ENCOUNTER — Encounter: Payer: Self-pay | Admitting: *Deleted

## 2015-07-04 LAB — PROTEIN ELECTROPHORESIS, SERUM, WITH REFLEX
A/G RATIO SPE: 1 (ref 0.7–1.7)
Albumin: 3.8 g/dL (ref 2.9–4.4)
Alpha 1: 0.2 g/dL (ref 0.0–0.4)
Alpha 2: 0.7 g/dL (ref 0.4–1.0)
BETA: 1.7 g/dL — AB (ref 0.7–1.3)
GLOBULIN, TOTAL: 3.8 g/dL (ref 2.2–3.9)
Gamma Globulin: 1.2 g/dL (ref 0.4–1.8)
Interpretation(See Below): 0
M-Spike, %: 1.1 g/dL — ABNORMAL HIGH
TOTAL PROTEIN: 7.6 g/dL (ref 6.0–8.5)

## 2015-07-04 LAB — UPEP/UIFE/LIGHT CHAINS/TP, 24-HR UR
% BETA, Urine: 37.6 %
ALBUMIN, U: 12.8 %
ALPHA 1 URINE: 0.8 %
ALPHA-2-GLOBULIN, U: 8.1 %
FREE LAMBDA LT CHAINS, UR: 5.38 mg/L (ref 0.24–6.66)
Free Kappa Lt Chains,Ur: 312 mg/L — ABNORMAL HIGH (ref 1.35–24.19)
GAMMA GLOBULIN URINE: 40.8 %
KAPPA/LAMBDA RATIO, U: 57.99 — AB (ref 2.04–10.37)
M-SPIKE, %: 16.2 % — AB
M-Spike, mg/24 hr: 17 mg/24 hr — ABNORMAL HIGH
PROTEIN 24H UR: 103 mg/(24.h) (ref 30–150)
PROTEIN,TOTAL,URINE: 4.8 mg/dL

## 2015-09-24 ENCOUNTER — Telehealth: Payer: Self-pay | Admitting: Family Medicine

## 2015-09-24 MED ORDER — LISINOPRIL-HYDROCHLOROTHIAZIDE 10-12.5 MG PO TABS: 1.0000 | ORAL_TABLET | Freq: Every day | ORAL | 0 refills | Status: DC

## 2015-09-24 NOTE — Telephone Encounter (Signed)
Medication sent in for patient. 

## 2015-09-24 NOTE — Telephone Encounter (Signed)
Pt request refill  lisinopril-hydrochlorothiazide (PRINZIDE,ZESTORETIC) 10-12.5 MG tablet  90 day  Pt will be out 10/03 and his cpx is not until 10/09  [facility]

## 2015-10-16 ENCOUNTER — Other Ambulatory Visit: Payer: Self-pay | Admitting: Family Medicine

## 2015-10-20 ENCOUNTER — Ambulatory Visit (INDEPENDENT_AMBULATORY_CARE_PROVIDER_SITE_OTHER): Payer: Medicare Other | Admitting: Family Medicine

## 2015-10-20 ENCOUNTER — Encounter: Payer: Self-pay | Admitting: Family Medicine

## 2015-10-20 VITALS — BP 100/64 | HR 67 | Temp 97.9°F | Resp 18 | Ht 74.25 in | Wt 172.2 lb

## 2015-10-20 DIAGNOSIS — I10 Essential (primary) hypertension: Secondary | ICD-10-CM

## 2015-10-20 DIAGNOSIS — Z0000 Encounter for general adult medical examination without abnormal findings: Secondary | ICD-10-CM | POA: Diagnosis not present

## 2015-10-20 DIAGNOSIS — Z23 Encounter for immunization: Secondary | ICD-10-CM

## 2015-10-20 NOTE — Progress Notes (Signed)
Subjective:     Patient ID: James Marks, male   DOB: January 15, 1940, 75 y.o.   MRN: [id number]  HPI Patient seen for Medicare subsequent annual wellness visit and medical follow-up. His chronic problems include history of hypertension, chronic kidney disease, probable multiple myeloma followed by oncology.  He is getting regular follow-up labs every 6 months per oncology. He had colonoscopy 2012. He needs flu vaccine and Pneumovax, otherwise immunizations up-to-date.  He remains on lisinopril HCTZ for hypertension and blood pressure well controlled. No recent dizziness. No chest pains. Appetite and weight are stable. No recent falls.  Continues to play golf regularly. Denies any depression symptoms.  Past Medical History:  Diagnosis Date  . Cancer ([place])    myeloma  . Diverticulitis   . Hearing loss   . Hx of colonic polyps   . Hyperlipidemia   . Hypertension   . Kidney stones   . Osteopenia   . Smoldering multiple myeloma ([place]) 08/26/2014  . Subarachnoid hemorrhage ([place]) 2/01   nontraumatic   Past Surgical History:  Procedure Laterality Date  . None      reports that he quit smoking about 3 years ago. His smoking use included Cigarettes. He started smoking about 63 years ago. He has a 44.00 pack-year smoking history. He quit smokeless tobacco use about 3 years ago. He reports that he drinks alcohol. He reports that he does not use drugs. family history includes Cancer in his mother; Diabetes in his mother; Diabetes Mellitus II in his brother; Hypertension in his father; Macular degeneration in his brother. No Known Allergies  1.  Risk factors based on Past Medical , Social, and Family history reviewed and as indicated above with no changes 2.  Limitations in physical activities None.  No recent falls. 3.  Depression/mood No active depression or anxiety issues 4.  Hearing  He has chronic bilateral hearing loss and hearing aids. 5.  ADLs independent in all. 6.  Cognitive function  (orientation to time and place, language, writing, speech,memory) no short or long term memory issues.  Language and judgement intact. 7.  Home Safety no issues 8.  Height, weight, and visual acuity.all stable. 9.  Counseling discussed age appropriate screenings.  The natural history of prostate cancer and ongoing controversy regarding screening and potential treatment outcomes of prostate cancer has been discussed with the patient. The meaning of a false positive PSA and a false negative PSA has been discussed. He indicates understanding of the limitations of this screening test and wishes not to proceed with screening PSA testing.  10. Recommendation of preventive services. Flu vaccine and pneumovax given. 11. Labs based on risk factors  None  12. Care Plan as above. 13. Other Providers Dr Marin Olp -Oncology 14. Written schedule of screening/prevention services given to patient.   Review of Systems  Constitutional: Negative for appetite change, chills, fatigue, fever and unexpected weight change.  HENT: Positive for hearing loss (chronic and unchanged).   Eyes: Negative for visual disturbance.  Respiratory: Negative for cough, chest tightness and shortness of breath.   Cardiovascular: Negative for chest pain, palpitations and leg swelling.  Gastrointestinal: Negative for abdominal pain, blood in stool, constipation, diarrhea, nausea and vomiting.  Genitourinary: Negative for dysuria.  Neurological: Negative for dizziness, syncope, weakness, light-headedness and headaches.  Hematological: Negative for adenopathy. Does not bruise/bleed easily.  Psychiatric/Behavioral: Negative for dysphoric mood.       Objective:   Physical Exam  Constitutional: He is oriented to person, place, and time.  He appears well-developed and well-nourished. No distress.  HENT:  Right Ear: External ear normal.  Left Ear: External ear normal.  Mouth/Throat: Oropharynx is clear and moist.  Neck: Neck supple. No  thyromegaly present.  Cardiovascular: Normal rate and regular rhythm.   Pulmonary/Chest: Effort normal and breath sounds normal. No respiratory distress. He has no wheezes. He has no rales.  Abdominal: Soft. Bowel sounds are normal. He exhibits no distension and no mass. There is no tenderness. There is no rebound and no guarding.  Musculoskeletal: He exhibits no edema.  Lymphadenopathy:    He has no cervical adenopathy.  Neurological: He is alert and oriented to person, place, and time. No cranial nerve deficit.  Skin:  Multiple scattered seborrheic keratoses on his trunk  Psychiatric: He has a normal mood and affect. His behavior is normal.       Assessment:     #1 Medicare subsequent annual wellness visit. Patient needs flu vaccine and Pneumovax  #2 hypertension stable and at goal  #3 Multiple myeloma followed by oncology  #4 chronic kidney disease    Plan:     -Flu vaccine and Pneumovax given -We elected not to obtain any screening lab work since he is getting regular screening through oncology -Colonoscopy is up-to-date  Eulas Post MD [facility]

## 2015-10-20 NOTE — Patient Instructions (Signed)
Health Maintenance  Topic Date Due  . ZOSTAVAX  10/19/2020 (Originally 02/09/1999)  . TETANUS/TDAP  10/19/2020 (Originally 01/12/2015)  . INFLUENZA VACCINE  Completed  . PNA vac Low Risk Adult  Completed

## 2015-10-20 NOTE — Progress Notes (Signed)
Pre visit review using our clinic review tool, if applicable. No additional management support is needed unless otherwise documented below in the visit note. 

## 2015-12-29 ENCOUNTER — Ambulatory Visit: Payer: Medicare Other

## 2015-12-29 ENCOUNTER — Other Ambulatory Visit: Payer: Self-pay | Admitting: *Deleted

## 2015-12-29 DIAGNOSIS — D472 Monoclonal gammopathy: Secondary | ICD-10-CM

## 2015-12-29 DIAGNOSIS — C9000 Multiple myeloma not having achieved remission: Secondary | ICD-10-CM

## 2015-12-31 ENCOUNTER — Other Ambulatory Visit (HOSPITAL_BASED_OUTPATIENT_CLINIC_OR_DEPARTMENT_OTHER): Payer: Medicare Other

## 2015-12-31 ENCOUNTER — Ambulatory Visit (HOSPITAL_BASED_OUTPATIENT_CLINIC_OR_DEPARTMENT_OTHER)
Admission: RE | Admit: 2015-12-31 | Discharge: 2015-12-31 | Disposition: A | Discharge status: Home / Self Care | Payer: Medicare Other | Source: Ambulatory Visit | Attending: Hematology & Oncology | Admitting: Hematology & Oncology

## 2015-12-31 ENCOUNTER — Ambulatory Visit (HOSPITAL_BASED_OUTPATIENT_CLINIC_OR_DEPARTMENT_OTHER): Payer: Medicare Other | Admitting: Hematology & Oncology

## 2015-12-31 VITALS — BP 115/53 | HR 68 | Temp 98.0°F | Resp 20 | Wt 181.0 lb

## 2015-12-31 DIAGNOSIS — Z720 Tobacco use: Secondary | ICD-10-CM | POA: Diagnosis not present

## 2015-12-31 DIAGNOSIS — D472 Monoclonal gammopathy: Secondary | ICD-10-CM

## 2015-12-31 DIAGNOSIS — C9000 Multiple myeloma not having achieved remission: Secondary | ICD-10-CM

## 2015-12-31 DIAGNOSIS — G629 Polyneuropathy, unspecified: Secondary | ICD-10-CM

## 2015-12-31 DIAGNOSIS — R05 Cough: Secondary | ICD-10-CM

## 2015-12-31 DIAGNOSIS — R058 Other specified cough: Secondary | ICD-10-CM

## 2015-12-31 DIAGNOSIS — R918 Other nonspecific abnormal finding of lung field: Secondary | ICD-10-CM | POA: Diagnosis not present

## 2015-12-31 LAB — COMPREHENSIVE METABOLIC PANEL
ALT: 12 U/L (ref 0–55)
AST: 15 U/L (ref 5–34)
Albumin: 3.6 g/dL (ref 3.5–5.0)
Alkaline Phosphatase: 120 U/L (ref 40–150)
Anion Gap: 8 mEq/L (ref 3–11)
BUN: 24.7 mg/dL (ref 7.0–26.0)
CALCIUM: 9.1 mg/dL (ref 8.4–10.4)
CHLORIDE: 106 meq/L (ref 98–109)
CO2: 26 meq/L (ref 22–29)
CREATININE: 1.7 mg/dL — AB (ref 0.7–1.3)
EGFR: 38 mL/min/{1.73_m2} — ABNORMAL LOW (ref >90)
GLUCOSE: 145 mg/dL — AB (ref 70–140)
POTASSIUM: 4.5 meq/L (ref 3.5–5.1)
SODIUM: 140 meq/L (ref 136–145)
Total Bilirubin: 0.39 mg/dL (ref 0.20–1.20)
Total Protein: 7.6 g/dL (ref 6.4–8.3)

## 2015-12-31 LAB — CBC WITH DIFFERENTIAL (CANCER CENTER ONLY)
BASO#: 0 10*3/uL (ref 0.0–0.2)
BASO%: 0.6 % (ref 0.0–2.0)
EOS ABS: 0.2 10*3/uL (ref 0.0–0.5)
EOS%: 2.4 % (ref 0.0–7.0)
HCT: 39.3 % (ref 38.7–49.9)
HGB: 12.8 g/dL — ABNORMAL LOW (ref 13.0–17.1)
LYMPH#: 1.3 10*3/uL (ref 0.9–3.3)
LYMPH%: 17.9 % (ref 14.0–48.0)
MCH: 28.2 pg (ref 28.0–33.4)
MCHC: 32.6 g/dL (ref 32.0–35.9)
MCV: 87 fL (ref 82–98)
MONO#: 0.5 10*3/uL (ref 0.1–0.9)
MONO%: 6.5 % (ref 0.0–13.0)
NEUT#: 5.2 10*3/uL (ref 1.5–6.5)
NEUT%: 72.6 % (ref 40.0–80.0)
PLATELETS: 231 10*3/uL (ref 145–400)
RBC: 4.54 10*6/uL (ref 4.20–5.70)
RDW: 13.2 % (ref 11.1–15.7)
WBC: 7.2 10*3/uL (ref 4.0–10.0)

## 2015-12-31 NOTE — Progress Notes (Signed)
Hematology and Oncology Follow Up Visit  James Marks:[id number] [birth year] 75 y.o. 12/31/2015   Principle Diagnosis:   IgA kappa smoldering myeloma-normal chromosomes  Current Therapy:    observation     Interim History:  James Marks is back for follow-up.   Last saw him back in June. Since then, he's been doing pretty well. He is playing golf. He played golf yesterday.  He is back to smoking now. He says he smokes about 4 or 5 cigarettes a day. His wife has knows that he is coughing more. He coughs mostly at nighttime. I suppose this might be acid reflux. However, we will have to follow-up with a chest x-ray.  His monoclonal studies have been doing reasonably stable. His last monoclonal spike was  1.1 g/dL. We will have to watch this. His IgA level was 1124 mg/dL. His Kappa Light chain was 10.5 mg/dL. All this is holding steady.  He has had no problem with infections. He has had no issues with bowels or bladder. He has had no problems with joint issues. He's when I complaining much of the neuropathy.   His appetite has been good. He has had no nausea or vomiting.  Currently, his performance status is ECOG 1.  Medications:  Current Outpatient Prescriptions:  .  lisinopril-hydrochlorothiazide (PRINZIDE,ZESTORETIC) 10-12.5 MG tablet, TAKE 1 TABLET DAILY, Disp: 90 tablet, Rfl: 2 No current facility-administered medications for this visit.   Facility-Administered Medications Ordered in Other Visits:  .  0.9 %  sodium chloride infusion, , Intravenous, Continuous, [name], MD, Stopped at 08/05/14 1710  Allergies: No Known Allergies  Past Medical History, Surgical history, Social history, and Family History were reviewed and updated.  Review of Systems: As above  Physical Exam:  weight is 181 lb (82.1 kg). His oral temperature is 98 F (36.7 C). His blood pressure is 115/53 (abnormal) and his pulse is 68. His respiration is 20.   Wt Readings from Last 3  Encounters:  12/31/15 181 lb (82.1 kg)  10/20/15 172 lb 4 oz (78.1 kg)  07/01/15 177 lb 12.8 oz (80.6 kg)     Elderly, thin white gentleman in no obvious distress. Head and neck exam shows no ocular or oral lesions. He has no palpable cervical or supraclavicular lymph nodes. Lungs are clear. Cardiac exam regular rate and rhythm with no murmurs, rubs or bruits. Abdomen is soft. He has good bowel sounds. There is no fluid wave. There is no palpable liver or spleen tip. Back exam shows no tenderness over the spine, ribs or hips. Extremities shows no clubbing, cyanosis or edema. Neurological exam shows no focal neurological deficits. Skin exam shows no rashes, ecchymoses or petechia.  Lab Results  Component Value Date   WBC 7.2 12/31/2015   HGB 12.8 (L) 12/31/2015   HCT 39.3 12/31/2015   MCV 87 12/31/2015   PLT 231 12/31/2015     Chemistry      Component Value Date/Time   NA 138 07/01/2015 1015   K 4.9 07/01/2015 1015   CL 103 08/26/2014 1144   CL 109 (H) 08/05/2014 1454   CO2 27 07/01/2015 1015   BUN 31.1 (H) 07/01/2015 1015   CREATININE 2.0 (H) 07/01/2015 1015      Component Value Date/Time   CALCIUM 9.5 07/01/2015 1015   ALKPHOS 115 07/01/2015 1015   AST 13 07/01/2015 1015   ALT 12 07/01/2015 1015   BILITOT 0.36 07/01/2015 1015         Impression  and Plan: James Marks is 75 year old man with IgA kappa smoldering myeloma. Again, I think he is borderline with smoldering myeloma versus MGUS.  He is smoking. This might make the neuropathy worse.  I will get a chest x-ray on him today. Again he is back to smoking. He has a cough.   I want to see back in 4 months. Again, with him coughing, I want to make sure that we be very attentive.   [name], MD 12/20/201711:26 AM

## 2016-01-01 ENCOUNTER — Telehealth: Payer: Self-pay | Admitting: *Deleted

## 2016-01-01 LAB — KAPPA/LAMBDA LIGHT CHAINS
IG LAMBDA FREE LIGHT CHAIN: 26.6 mg/L — AB (ref 5.7–26.3)
Ig Kappa Free Light Chain: 86.6 mg/L — ABNORMAL HIGH (ref 3.3–19.4)
KAPPA/LAMBDA FLC RATIO: 3.26 — AB (ref 0.26–1.65)

## 2016-01-01 LAB — IGG, IGA, IGM
IGM (IMMUNOGLOBIN M), SRM: 79 mg/dL (ref 15–143)
IgG, Qn, Serum: 1081 mg/dL (ref 700–1600)

## 2016-01-01 LAB — BETA 2 MICROGLOBULIN, SERUM: BETA 2: 3.3 mg/L — AB (ref 0.6–2.4)

## 2016-01-01 NOTE — Telephone Encounter (Addendum)
Patient aware of results  ----- Message from [name], MD sent at 12/31/2015  4:10 PM EST ----- Call - NO cancer on the Xray!!  pete

## 2016-01-02 LAB — UIFE/LIGHT CHAINS/TP QN, 24-HR UR
FR KAPPA LT CH,24HR: 622 mg/24 hr
FR LAMBDA LT CH,24HR: 8 mg/24 hr
Free Kappa Lt Chains,Ur: 311 mg/L — ABNORMAL HIGH (ref 1.35–24.19)
Free Lambda Lt Chains,Ur: 3.96 mg/L (ref 0.24–6.66)
Kappa/Lambda Ratio,U: 78.54 — ABNORMAL HIGH (ref 2.04–10.37)
PROTEIN 24H UR: 122 mg/(24.h) (ref 30–150)
PROTEIN UR: 6.1 mg/dL

## 2016-01-06 ENCOUNTER — Telehealth: Payer: Self-pay | Admitting: *Deleted

## 2016-01-06 NOTE — Telephone Encounter (Addendum)
Patient's wife aware of results.   ----- Message from [name], MD sent at 01/02/2016  4:48 PM EST ----- Call - the urine protein is very stable.  6 months ago it was 312. Now it is 311.  Merry Christmas!!  James Marks

## 2016-01-08 LAB — PROTEIN ELECTROPHORESIS, SERUM, WITH REFLEX
A/G RATIO SPE: 1 (ref 0.7–1.7)
ALPHA 2: 0.6 g/dL (ref 0.4–1.0)
Albumin: 3.5 g/dL (ref 2.9–4.4)
Alpha 1: 0.2 g/dL (ref 0.0–0.4)
Beta: 1.6 g/dL — ABNORMAL HIGH (ref 0.7–1.3)
GLOBULIN, TOTAL: 3.5 g/dL (ref 2.2–3.9)
Gamma Globulin: 1 g/dL (ref 0.4–1.8)
INTERPRETATION(SEE BELOW): 0
M-SPIKE, %: 0.5 g/dL — AB
Total Protein: 7 g/dL (ref 6.0–8.5)

## 2016-04-28 ENCOUNTER — Other Ambulatory Visit: Payer: Self-pay | Admitting: Lab

## 2016-04-28 DIAGNOSIS — C9000 Multiple myeloma not having achieved remission: Secondary | ICD-10-CM

## 2016-04-28 DIAGNOSIS — D472 Monoclonal gammopathy: Secondary | ICD-10-CM

## 2016-04-28 DIAGNOSIS — R058 Other specified cough: Secondary | ICD-10-CM

## 2016-04-28 DIAGNOSIS — R05 Cough: Secondary | ICD-10-CM

## 2016-05-03 ENCOUNTER — Other Ambulatory Visit: Payer: Self-pay | Admitting: *Deleted

## 2016-05-03 ENCOUNTER — Ambulatory Visit (HOSPITAL_BASED_OUTPATIENT_CLINIC_OR_DEPARTMENT_OTHER)
Admission: RE | Admit: 2016-05-03 | Discharge: 2016-05-03 | Disposition: A | Discharge status: Home / Self Care | Payer: Medicare Other | Source: Ambulatory Visit | Attending: Hematology & Oncology | Admitting: Hematology & Oncology

## 2016-05-03 ENCOUNTER — Ambulatory Visit (HOSPITAL_BASED_OUTPATIENT_CLINIC_OR_DEPARTMENT_OTHER): Payer: Medicare Other | Admitting: Hematology & Oncology

## 2016-05-03 ENCOUNTER — Other Ambulatory Visit (HOSPITAL_BASED_OUTPATIENT_CLINIC_OR_DEPARTMENT_OTHER): Payer: Medicare Other

## 2016-05-03 VITALS — BP 119/63 | HR 64 | Temp 97.7°F | Resp 18 | Wt 180.1 lb

## 2016-05-03 DIAGNOSIS — C9000 Multiple myeloma not having achieved remission: Secondary | ICD-10-CM | POA: Insufficient documentation

## 2016-05-03 DIAGNOSIS — D472 Monoclonal gammopathy: Secondary | ICD-10-CM

## 2016-05-03 DIAGNOSIS — R05 Cough: Secondary | ICD-10-CM

## 2016-05-03 DIAGNOSIS — M25511 Pain in right shoulder: Secondary | ICD-10-CM | POA: Diagnosis not present

## 2016-05-03 DIAGNOSIS — R058 Other specified cough: Secondary | ICD-10-CM

## 2016-05-03 LAB — CBC WITH DIFFERENTIAL (CANCER CENTER ONLY)
BASO#: 0 10*3/uL (ref 0.0–0.2)
BASO%: 0.5 % (ref 0.0–2.0)
EOS%: 1.9 % (ref 0.0–7.0)
Eosinophils Absolute: 0.1 10*3/uL (ref 0.0–0.5)
HCT: 38.5 % — ABNORMAL LOW (ref 38.7–49.9)
HGB: 12.5 g/dL — ABNORMAL LOW (ref 13.0–17.1)
LYMPH#: 1.3 10*3/uL (ref 0.9–3.3)
LYMPH%: 17.7 % (ref 14.0–48.0)
MCH: 28.7 pg (ref 28.0–33.4)
MCHC: 32.5 g/dL (ref 32.0–35.9)
MCV: 88 fL (ref 82–98)
MONO#: 0.5 10*3/uL (ref 0.1–0.9)
MONO%: 6.2 % (ref 0.0–13.0)
NEUT#: 5.4 10*3/uL (ref 1.5–6.5)
NEUT%: 73.7 % (ref 40.0–80.0)
Platelets: 233 10*3/uL (ref 145–400)
RBC: 4.36 10*6/uL (ref 4.20–5.70)
RDW: 13.3 % (ref 11.1–15.7)
WBC: 7.4 10*3/uL (ref 4.0–10.0)

## 2016-05-03 LAB — COMPREHENSIVE METABOLIC PANEL WITH GFR
ALT: 10 U/L (ref 0–55)
AST: 16 U/L (ref 5–34)
Albumin: 3.5 g/dL (ref 3.5–5.0)
Alkaline Phosphatase: 110 U/L (ref 40–150)
Anion Gap: 8 meq/L (ref 3–11)
BUN: 32.1 mg/dL — ABNORMAL HIGH (ref 7.0–26.0)
CO2: 24 meq/L (ref 22–29)
Calcium: 9 mg/dL (ref 8.4–10.4)
Chloride: 107 meq/L (ref 98–109)
Creatinine: 1.7 mg/dL — ABNORMAL HIGH (ref 0.7–1.3)
EGFR: 39 mL/min/{1.73_m2} — ABNORMAL LOW
Glucose: 108 mg/dL (ref 70–140)
Potassium: 4.8 meq/L (ref 3.5–5.1)
Sodium: 139 meq/L (ref 136–145)
Total Bilirubin: 0.41 mg/dL (ref 0.20–1.20)
Total Protein: 7.5 g/dL (ref 6.4–8.3)

## 2016-05-03 NOTE — Progress Notes (Signed)
Hematology and Oncology Follow Up Visit  James Marks [id number] [birth year] 76 y.o. 05/03/2016   Principle Diagnosis:   IgA kappa smoldering myeloma-normal chromosomes  Current Therapy:    observation     Interim History:  James Marks is back for follow-up.   Last saw him back in December. Since then, he's been doing pretty well. He is playing golf.   He is complaining of some discomfort in his right shoulder. This seems to hurt mostly when he moves his shoulder backward. This is been going on for a few weeks.  When we last saw him in December, we did do a chest x-ray because of his smoking. The chest x-ray was normal.   His monoclonal studies have been doing reasonably stable. His last monoclonal spike was 0.5 g/dL. We will have to watch this. His IgA level was 1095 mg/dL. His Kappa Light chain was 8.7 mg/dL. All this is holding steady.  He has had no problem with infections. He has had no issues with bowels or bladder. He has had no problems with joint issues. He's when I complaining much of the neuropathy.   His appetite has been good. He has had no nausea or vomiting.  Currently, his performance status is ECOG 1.  Medications:  Current Outpatient Prescriptions:  .  lisinopril-hydrochlorothiazide (PRINZIDE,ZESTORETIC) 10-12.5 MG tablet, TAKE 1 TABLET DAILY, Disp: 90 tablet, Rfl: 2 No current facility-administered medications for this visit.   Facility-Administered Medications Ordered in Other Visits:  .  0.9 %  sodium chloride infusion, , Intravenous, Continuous, [name], MD, Stopped at 08/05/14 1710  Allergies: No Known Allergies  Past Medical History, Surgical history, Social history, and Family History were reviewed and updated.  Review of Systems: As above  Physical Exam:  weight is 180 lb 1.9 oz (81.7 kg). His oral temperature is 97.7 F (36.5 C). His blood pressure is 119/63 and his pulse is 64. His respiration is 18 and oxygen saturation is 98%.     Wt Readings from Last 3 Encounters:  05/03/16 180 lb 1.9 oz (81.7 kg)  12/31/15 181 lb (82.1 kg)  10/20/15 172 lb 4 oz (78.1 kg)     Elderly, thin white gentleman in no obvious distress. Head and neck exam shows no ocular or oral lesions. He has no palpable cervical or supraclavicular lymph nodes. Lungs are clear. Cardiac exam regular rate and rhythm with no murmurs, rubs or bruits. Abdomen is soft. He has good bowel sounds. There is no fluid wave. There is no palpable liver or spleen tip. Back exam shows no tenderness over the spine, ribs or hips. Extremities shows no clubbing, cyanosis or edema. Neurological exam shows no focal neurological deficits. Skin exam shows no rashes, ecchymoses or petechia.  Lab Results  Component Value Date   WBC 7.4 05/03/2016   HGB 12.5 (L) 05/03/2016   HCT 38.5 (L) 05/03/2016   MCV 88 05/03/2016   PLT 233 05/03/2016     Chemistry      Component Value Date/Time   NA 140 12/31/2015 0959   K 4.5 12/31/2015 0959   CL 103 08/26/2014 1144   CL 109 (H) 08/05/2014 1454   CO2 26 12/31/2015 0959   BUN 24.7 12/31/2015 0959   CREATININE 1.7 (H) 12/31/2015 0959      Component Value Date/Time   CALCIUM 9.1 12/31/2015 0959   ALKPHOS 120 12/31/2015 0959   AST 15 12/31/2015 0959   ALT 12 12/31/2015 0959   BILITOT 0.39 12/31/2015 0959  Impression and Plan: James Marks is 76 year old man with IgA kappa smoldering myeloma. Again, I think he is borderline with smoldering myeloma versus MGUS.  I will go ahead and get an x-ray of his right shoulder. We'll see if this shows [place] anything that is suspicious for actual myeloma.  I will see him back in 3 months. Hopefully, he will stop smoking.Marland Kitchen   [name], MD 4/23/201811:00 AM

## 2016-05-04 ENCOUNTER — Telehealth: Payer: Self-pay | Admitting: *Deleted

## 2016-05-04 LAB — KAPPA/LAMBDA LIGHT CHAINS
IG KAPPA FREE LIGHT CHAIN: 79.3 mg/L — AB (ref 3.3–19.4)
Ig Lambda Free Light Chain: 26.9 mg/L — ABNORMAL HIGH (ref 5.7–26.3)
KAPPA/LAMBDA FLC RATIO: 2.95 — AB (ref 0.26–1.65)

## 2016-05-04 LAB — UIFE/LIGHT CHAINS/TP QN, 24-HR UR
FR KAPPA LT CH,24HR: 461 mg/24 hr
FR LAMBDA LT CH,24HR: 7 mg/(24.h)
FREE KAPPA LT CHAINS, UR: 288 mg/L — AB (ref 1.35–24.19)
FREE LAMBDA LT CHAINS, UR: 4.39 mg/L (ref 0.24–6.66)
Kappa/Lambda Ratio,U: 65.6 — ABNORMAL HIGH (ref 2.04–10.37)
PROTEIN,TOTAL,URINE: 6.1 mg/dL
Prot,24hr calculated: 98 mg/24 hr (ref 30–150)

## 2016-05-04 LAB — IGG, IGA, IGM
IGM (IMMUNOGLOBIN M), SRM: 76 mg/dL (ref 15–143)
IgA, Qn, Serum: 1146 mg/dL — ABNORMAL HIGH (ref 61–437)
IgG, Qn, Serum: 1016 mg/dL (ref 700–1600)

## 2016-05-04 NOTE — Telephone Encounter (Addendum)
Patient's wife is aware of results  ----- Message from [name], MD sent at 05/03/2016  4:00 PM EDT ----- Call - the right shoulder is doing ok!!!!  NO cancer or obvious arthritis or myeloma!!  pete

## 2016-05-05 ENCOUNTER — Telehealth: Payer: Self-pay | Admitting: *Deleted

## 2016-05-05 NOTE — Telephone Encounter (Addendum)
Patient is aware of results  ----- Message from [name], MD sent at 05/05/2016  7:04 AM EDT ----- Call - the protein in the urine is less!!!  It went from 310 down to 290.  This is very encouraging!! pete

## 2016-05-07 LAB — PROTEIN ELECTROPHORESIS, SERUM, WITH REFLEX
A/G Ratio: 1 (ref 0.7–1.7)
ALPHA 1: 0.2 g/dL (ref 0.0–0.4)
ALPHA 2: 0.6 g/dL (ref 0.4–1.0)
Albumin: 3.5 g/dL (ref 2.9–4.4)
Beta: 1.5 g/dL — ABNORMAL HIGH (ref 0.7–1.3)
Gamma Globulin: 1.1 g/dL (ref 0.4–1.8)
Globulin, Total: 3.5 g/dL (ref 2.2–3.9)
Interpretation(See Below): 0
M-SPIKE, %: 0.7 g/dL — AB
Total Protein: 7 g/dL (ref 6.0–8.5)

## 2016-06-02 ENCOUNTER — Encounter: Payer: Self-pay | Admitting: *Deleted

## 2016-07-05 ENCOUNTER — Telehealth: Payer: Self-pay | Admitting: Family Medicine

## 2016-07-05 ENCOUNTER — Ambulatory Visit: Payer: Medicare Other | Admitting: Family Medicine

## 2016-07-05 NOTE — Telephone Encounter (Signed)
Needs follow up.  If having frequent gout flares, we need to look at getting him off the HCTZ.

## 2016-07-05 NOTE — Telephone Encounter (Signed)
Refill for 6 months. 

## 2016-07-05 NOTE — Telephone Encounter (Signed)
Patient needs lisinopril-hydrochlorothiazide (PRINZIDE,ZESTORETIC) 10-12.5 MG tablet   sent to the [facility] on [address]

## 2016-07-05 NOTE — Telephone Encounter (Signed)
° ° °  I spoke with pt and advised him that Dr Elease Hashimoto would like for him to come in. Pt said said he didn't want to come in.

## 2016-07-05 NOTE — Telephone Encounter (Signed)
Left message on machine for patient to return our call 

## 2016-07-05 NOTE — Telephone Encounter (Signed)
The patients wife scheduled the patients appointment and he has an appointment with Delano Metz but he has a golfing buddy to met at that time and there are no earlier appointments. He states that his gout is acting up.

## 2016-07-05 NOTE — Telephone Encounter (Signed)
Okay to refill lisinopril/HCTZ?

## 2016-07-06 MED ORDER — LISINOPRIL-HYDROCHLOROTHIAZIDE 10-12.5 MG PO TABS: 1.0000 | ORAL_TABLET | Freq: Every day | ORAL | 1 refills | Status: DC

## 2016-07-06 NOTE — Telephone Encounter (Signed)
Rx sent 

## 2016-07-30 DIAGNOSIS — C9000 Multiple myeloma not having achieved remission: Secondary | ICD-10-CM | POA: Diagnosis not present

## 2016-08-02 ENCOUNTER — Ambulatory Visit (HOSPITAL_BASED_OUTPATIENT_CLINIC_OR_DEPARTMENT_OTHER): Payer: Medicare Other | Admitting: Hematology & Oncology

## 2016-08-02 ENCOUNTER — Other Ambulatory Visit (HOSPITAL_BASED_OUTPATIENT_CLINIC_OR_DEPARTMENT_OTHER): Payer: Medicare Other

## 2016-08-02 VITALS — BP 126/66 | HR 65 | Temp 97.7°F | Resp 18 | Wt 174.0 lb

## 2016-08-02 DIAGNOSIS — C9000 Multiple myeloma not having achieved remission: Secondary | ICD-10-CM | POA: Diagnosis not present

## 2016-08-02 DIAGNOSIS — D472 Monoclonal gammopathy: Secondary | ICD-10-CM

## 2016-08-02 DIAGNOSIS — G629 Polyneuropathy, unspecified: Secondary | ICD-10-CM | POA: Diagnosis not present

## 2016-08-02 LAB — CBC WITH DIFFERENTIAL (CANCER CENTER ONLY)
BASO#: 0 10*3/uL (ref 0.0–0.2)
BASO%: 0.2 % (ref 0.0–2.0)
EOS ABS: 0.2 10*3/uL (ref 0.0–0.5)
EOS%: 1.8 % (ref 0.0–7.0)
HCT: 41.6 % (ref 38.7–49.9)
HEMOGLOBIN: 13.6 g/dL (ref 13.0–17.1)
LYMPH#: 1.5 10*3/uL (ref 0.9–3.3)
LYMPH%: 16.3 % (ref 14.0–48.0)
MCH: 28.8 pg (ref 28.0–33.4)
MCHC: 32.7 g/dL (ref 32.0–35.9)
MCV: 88 fL (ref 82–98)
MONO#: 0.6 10*3/uL (ref 0.1–0.9)
MONO%: 6.6 % (ref 0.0–13.0)
NEUT%: 75.1 % (ref 40.0–80.0)
NEUTROS ABS: 6.7 10*3/uL — AB (ref 1.5–6.5)
Platelets: 242 10*3/uL (ref 145–400)
RBC: 4.73 10*6/uL (ref 4.20–5.70)
RDW: 13.3 % (ref 11.1–15.7)
WBC: 8.9 10*3/uL (ref 4.0–10.0)

## 2016-08-02 LAB — COMPREHENSIVE METABOLIC PANEL
ALBUMIN: 3.6 g/dL (ref 3.5–5.0)
ALK PHOS: 115 U/L (ref 40–150)
ALT: 9 U/L (ref 0–55)
AST: 14 U/L (ref 5–34)
Anion Gap: 8 mEq/L (ref 3–11)
BILIRUBIN TOTAL: 0.41 mg/dL (ref 0.20–1.20)
BUN: 34.3 mg/dL — AB (ref 7.0–26.0)
CO2: 24 mEq/L (ref 22–29)
Calcium: 9.5 mg/dL (ref 8.4–10.4)
Chloride: 107 mEq/L (ref 98–109)
Creatinine: 1.7 mg/dL — ABNORMAL HIGH (ref 0.7–1.3)
EGFR: 37 mL/min/{1.73_m2} — ABNORMAL LOW (ref >90)
GLUCOSE: 98 mg/dL (ref 70–140)
Potassium: 4.7 mEq/L (ref 3.5–5.1)
Sodium: 139 mEq/L (ref 136–145)
TOTAL PROTEIN: 8 g/dL (ref 6.4–8.3)

## 2016-08-02 NOTE — Progress Notes (Signed)
Hematology and Oncology Follow Up Visit  James Marks [id number] [birth year] 76 y.o. 08/02/2016   Principle Diagnosis:   IgA kappa smoldering myeloma-normal chromosomes  Current Therapy:    observation     Interim History:  James Marks is back for follow-up. So far, he's had a pretty good summer. He's been playing golf. He and his wife good around to [facility].  He still has the neuropathy in his feet. This might be from past surgery.  He's had no infections. He's had no weight loss or weight gain. He is still smoking.  There's been no change in bowel or bladder habits.  When we saw him in April, his myeloma studies show that his M spike was 0.7 g/dL. His IgA level was 1146 milligrams per deciliter. His Kappa Lightchain was 8 mg/dL. All this is holding pretty steady.  His 24-hour urine that we did on him showed a 24-hour Kappa Lightchain excretion of 288 mg per day. This also was stable.  Currently, his performance status is ECOG 1.  Medications:  Current Outpatient Prescriptions:  .  lisinopril-hydrochlorothiazide (PRINZIDE,ZESTORETIC) 10-12.5 MG tablet, Take 1 tablet by mouth daily., Disp: 90 tablet, Rfl: 1 No current facility-administered medications for this visit.   Facility-Administered Medications Ordered in Other Visits:  .  0.9 %  sodium chloride infusion, , Intravenous, Continuous, Ennever, Rudell Cobb, MD, Stopped at 08/05/14 1710  Allergies: No Known Allergies  Past Medical History, Surgical history, Social history, and Family History were reviewed and updated.  Review of Systems: As above  Physical Exam:  weight is 174 lb (78.9 kg). His oral temperature is 97.7 F (36.5 C). His blood pressure is 126/66 and his pulse is 65. His respiration is 18 and oxygen saturation is 99%.   Wt Readings from Last 3 Encounters:  08/02/16 174 lb (78.9 kg)  05/03/16 180 lb 1.9 oz (81.7 kg)  12/31/15 181 lb (82.1 kg)     Elderly, thin white gentleman in no obvious  distress. Head and neck exam shows no ocular or oral lesions. He has no palpable cervical or supraclavicular lymph nodes. Lungs are clear. Cardiac exam regular rate and rhythm with no murmurs, rubs or bruits. Abdomen is soft. He has good bowel sounds. There is no fluid wave. There is no palpable liver or spleen tip. Back exam shows no tenderness over the spine, ribs or hips. Extremities shows no clubbing, cyanosis or edema. Neurological exam shows no focal neurological deficits. Skin exam shows no rashes, ecchymoses or petechia.  Lab Results  Component Value Date   WBC 8.9 08/02/2016   HGB 13.6 08/02/2016   HCT 41.6 08/02/2016   MCV 88 08/02/2016   PLT 242 08/02/2016     Chemistry      Component Value Date/Time   NA 139 08/02/2016 1146   K 4.7 08/02/2016 1146   CL 103 08/26/2014 1144   CL 109 (H) 08/05/2014 1454   CO2 24 08/02/2016 1146   BUN 34.3 (H) 08/02/2016 1146   CREATININE 1.7 (H) 08/02/2016 1146      Component Value Date/Time   CALCIUM 9.5 08/02/2016 1146   ALKPHOS 115 08/02/2016 1146   AST 14 08/02/2016 1146   ALT 9 08/02/2016 1146   BILITOT 0.41 08/02/2016 1146         Impression and Plan: James Marks is 76 year old man with IgA kappa smoldering myeloma. Again, I think he is borderline with smoldering myeloma versus MGUS.  From my point of view, everything still looks  quite stable.  We will get him back in 4 months. I think this would be reasonable.  If we find that his myeloma studies are significantly different, then we will get him back sooner and consider therapy on him...   [name], MD 7/23/20185:40 PM

## 2016-08-03 LAB — IGG, IGA, IGM
IGM (IMMUNOGLOBIN M), SRM: 83 mg/dL (ref 15–143)
IgG, Qn, Serum: 1089 mg/dL (ref 700–1600)

## 2016-08-03 LAB — KAPPA/LAMBDA LIGHT CHAINS
IG KAPPA FREE LIGHT CHAIN: 81.2 mg/L — AB (ref 3.3–19.4)
Ig Lambda Free Light Chain: 24.3 mg/L (ref 5.7–26.3)
KAPPA/LAMBDA FLC RATIO: 3.34 — AB (ref 0.26–1.65)

## 2016-08-04 LAB — UIFE/LIGHT CHAINS/TP QN, 24-HR UR
FR KAPPA LT CH,24HR: 444 mg/24 hr
FR LAMBDA LT CH,24HR: 7 mg/24 hr
Free Kappa Lt Chains,Ur: 269 mg/L — ABNORMAL HIGH (ref 1.35–24.19)
Free Lambda Lt Chains,Ur: 4.46 mg/L (ref 0.24–6.66)
Kappa/Lambda Ratio,U: 60.31 — ABNORMAL HIGH (ref 2.04–10.37)
PROTEIN 24H UR: 102 mg/(24.h) (ref 30–150)
PROTEIN UR: 6.2 mg/dL

## 2016-08-05 ENCOUNTER — Telehealth: Payer: Self-pay | Admitting: *Deleted

## 2016-08-05 LAB — PROTEIN ELECTROPHORESIS, SERUM, WITH REFLEX
A/G RATIO SPE: 1 (ref 0.7–1.7)
ALBUMIN: 3.7 g/dL (ref 2.9–4.4)
Alpha 1: 0.2 g/dL (ref 0.0–0.4)
Alpha 2: 0.7 g/dL (ref 0.4–1.0)
Beta: 1.7 g/dL — ABNORMAL HIGH (ref 0.7–1.3)
GAMMA GLOBULIN: 1.1 g/dL (ref 0.4–1.8)
Globulin, Total: 3.7 g/dL (ref 2.2–3.9)
INTERPRETATION(SEE BELOW): 0
M-Spike, %: 1.1 g/dL — ABNORMAL HIGH
TOTAL PROTEIN: 7.4 g/dL (ref 6.0–8.5)

## 2016-08-05 NOTE — Telephone Encounter (Addendum)
Patient is aware of results  ----- Message from James Napoleon, MD sent at 08/04/2016  5:43 PM EDT ----- Please call and let us know that the urinary protein keeps getting better. The level now is 270. Before it was 290. As such, we will just keep watching him. Thank you

## 2016-08-13 DIAGNOSIS — H2513 Age-related nuclear cataract, bilateral: Secondary | ICD-10-CM | POA: Diagnosis not present

## 2016-08-31 ENCOUNTER — Other Ambulatory Visit (HOSPITAL_BASED_OUTPATIENT_CLINIC_OR_DEPARTMENT_OTHER): Payer: Medicare Other

## 2016-08-31 ENCOUNTER — Ambulatory Visit (HOSPITAL_BASED_OUTPATIENT_CLINIC_OR_DEPARTMENT_OTHER): Payer: Medicare Other | Admitting: Hematology & Oncology

## 2016-08-31 ENCOUNTER — Other Ambulatory Visit (HOSPITAL_COMMUNITY)
Admission: RE | Admit: 2016-08-31 | Discharge: 2016-08-31 | Disposition: A | Discharge status: Home / Self Care | Payer: Medicare Other | Source: Ambulatory Visit | Attending: Hematology & Oncology | Admitting: Hematology & Oncology

## 2016-08-31 ENCOUNTER — Ambulatory Visit: Payer: Medicare Other | Admitting: Hematology & Oncology

## 2016-08-31 ENCOUNTER — Encounter: Payer: Self-pay | Admitting: Hematology & Oncology

## 2016-08-31 VITALS — BP 121/67 | HR 57 | Temp 97.7°F | Resp 18 | Wt 174.4 lb

## 2016-08-31 DIAGNOSIS — D472 Monoclonal gammopathy: Secondary | ICD-10-CM

## 2016-08-31 DIAGNOSIS — C9000 Multiple myeloma not having achieved remission: Secondary | ICD-10-CM

## 2016-08-31 DIAGNOSIS — D4989 Neoplasm of unspecified behavior of other specified sites: Secondary | ICD-10-CM | POA: Diagnosis not present

## 2016-08-31 LAB — CBC WITH DIFFERENTIAL (CANCER CENTER ONLY)
BASO#: 0.1 10*3/uL (ref 0.0–0.2)
BASO%: 0.6 % (ref 0.0–2.0)
EOS%: 3.1 % (ref 0.0–7.0)
Eosinophils Absolute: 0.2 10*3/uL (ref 0.0–0.5)
HCT: 40.3 % (ref 38.7–49.9)
HEMOGLOBIN: 13 g/dL (ref 13.0–17.1)
LYMPH#: 1.8 10*3/uL (ref 0.9–3.3)
LYMPH%: 23.3 % (ref 14.0–48.0)
MCH: 28.6 pg (ref 28.0–33.4)
MCHC: 32.3 g/dL (ref 32.0–35.9)
MCV: 89 fL (ref 82–98)
MONO#: 0.5 10*3/uL (ref 0.1–0.9)
MONO%: 6.4 % (ref 0.0–13.0)
NEUT%: 66.6 % (ref 40.0–80.0)
NEUTROS ABS: 5.1 10*3/uL (ref 1.5–6.5)
PLATELETS: 269 10*3/uL (ref 145–400)
RBC: 4.54 10*6/uL (ref 4.20–5.70)
RDW: 13.5 % (ref 11.1–15.7)
WBC: 7.7 10*3/uL (ref 4.0–10.0)

## 2016-08-31 LAB — CMP (CANCER CENTER ONLY)
ALBUMIN: 3.4 g/dL (ref 3.3–5.5)
ALT(SGPT): 18 U/L (ref 10–47)
AST: 18 U/L (ref 11–38)
Alkaline Phosphatase: 118 U/L — ABNORMAL HIGH (ref 26–84)
BILIRUBIN TOTAL: 0.6 mg/dL (ref 0.20–1.60)
BUN, Bld: 26 mg/dL — ABNORMAL HIGH (ref 7–22)
CO2: 28 meq/L (ref 18–33)
CREATININE: 1.8 mg/dL — AB (ref 0.6–1.2)
Calcium: 9.2 mg/dL (ref 8.0–10.3)
Chloride: 108 mEq/L (ref 98–108)
GLUCOSE: 121 mg/dL — AB (ref 73–118)
Potassium: 4.2 mEq/L (ref 3.3–4.7)
SODIUM: 142 meq/L (ref 128–145)
Total Protein: 8 g/dL (ref 6.4–8.1)

## 2016-08-31 NOTE — Progress Notes (Signed)
Patient discharged in stable conditon after 30 minutes of observation. Denies pain, dressing at bone marrow site clean and dry. Patient is ambulatory and he walked out accompanied by wife and son.

## 2016-08-31 NOTE — Progress Notes (Signed)
This is a bone marrow biopsy and aspirate note for James Marks. He was brought to the treatment room at the [facility]. We did this to assess for progression of smoldering myeloma.  We did the appropriate timeout procedure at 8:05. We received informed consent at 8 AM.  He is placed into his right side. The left posterior iliac crest region was prepped and draped in sterile fashion area and 5 mL of 1% lidocaine was infiltrate under the skin down to the periosteum.  We use a scalpel to make an incision into the skin. We obtain 8cc of fluid. This was sent off for the appropriate studies. I then obtained a good bone marrow biopsy core. We used the combination Jamshidi needle.  He tolerated the procedure well. There were no complications. We cleaned and dressed the procedure site sterilely.  We will have him come back for follow-up once the results have come back from his bone marrow test.  Pete Ennever, MD 

## 2016-09-01 DIAGNOSIS — C9000 Multiple myeloma not having achieved remission: Secondary | ICD-10-CM | POA: Diagnosis not present

## 2016-09-01 LAB — IGG, IGA, IGM
IGM (IMMUNOGLOBIN M), SRM: 77 mg/dL (ref 15–143)
IgA, Qn, Serum: 1234 mg/dL — ABNORMAL HIGH (ref 61–437)

## 2016-09-01 LAB — BETA 2 MICROGLOBULIN, SERUM: Beta-2: 4.2 mg/L — ABNORMAL HIGH (ref 0.6–2.4)

## 2016-09-01 LAB — KAPPA/LAMBDA LIGHT CHAINS
IG KAPPA FREE LIGHT CHAIN: 93.2 mg/L — AB (ref 3.3–19.4)
Ig Lambda Free Light Chain: 26.8 mg/L — ABNORMAL HIGH (ref 5.7–26.3)
Kappa/Lambda FluidC Ratio: 3.48 — ABNORMAL HIGH (ref 0.26–1.65)

## 2016-09-02 LAB — UIFE/LIGHT CHAINS/TP QN, 24-HR UR
FR KAPPA LT CH,24HR: 352 mg/(24.h)
FR LAMBDA LT CH,24HR: 6 mg/24 hr
FREE LAMBDA LT CHAINS, UR: 2.55 mg/L (ref 0.24–6.66)
Free Kappa Lt Chains,Ur: 160 mg/L — ABNORMAL HIGH (ref 1.35–24.19)
KAPPA/LAMBDA RATIO, U: 62.75 — AB (ref 2.04–10.37)
PROTEIN,TOTAL,URINE: <4 mg/dL
Prot,24hr calculated: <88 mg/24 hr (ref 30–150)

## 2016-09-03 LAB — PROTEIN ELECTROPHORESIS, SERUM, WITH REFLEX
A/G Ratio: 0.9 (ref 0.7–1.7)
ALBUMIN: 3.4 g/dL (ref 2.9–4.4)
ALPHA 1: 0.2 g/dL (ref 0.0–0.4)
Alpha 2: 0.7 g/dL (ref 0.4–1.0)
BETA: 1.8 g/dL — AB (ref 0.7–1.3)
GAMMA GLOBULIN: 1.1 g/dL (ref 0.4–1.8)
Globulin, Total: 3.9 g/dL (ref 2.2–3.9)
Interpretation(See Below): 0
M-SPIKE, %: 0.7 g/dL — AB
TOTAL PROTEIN: 7.3 g/dL (ref 6.0–8.5)

## 2016-09-09 ENCOUNTER — Other Ambulatory Visit (HOSPITAL_BASED_OUTPATIENT_CLINIC_OR_DEPARTMENT_OTHER): Payer: Medicare Other

## 2016-09-09 ENCOUNTER — Ambulatory Visit (HOSPITAL_BASED_OUTPATIENT_CLINIC_OR_DEPARTMENT_OTHER): Payer: Medicare Other | Admitting: Hematology & Oncology

## 2016-09-09 VITALS — BP 111/58 | HR 69 | Temp 98.1°F | Resp 18 | Wt 176.0 lb

## 2016-09-09 DIAGNOSIS — C9000 Multiple myeloma not having achieved remission: Secondary | ICD-10-CM | POA: Diagnosis not present

## 2016-09-09 DIAGNOSIS — D472 Monoclonal gammopathy: Secondary | ICD-10-CM

## 2016-09-09 LAB — CBC WITH DIFFERENTIAL (CANCER CENTER ONLY)
BASO#: 0 10*3/uL (ref 0.0–0.2)
BASO%: 0.6 % (ref 0.0–2.0)
EOS ABS: 0.2 10*3/uL (ref 0.0–0.5)
EOS%: 2.9 % (ref 0.0–7.0)
HEMATOCRIT: 40 % (ref 38.7–49.9)
HEMOGLOBIN: 12.8 g/dL — AB (ref 13.0–17.1)
LYMPH#: 1.3 10*3/uL (ref 0.9–3.3)
LYMPH%: 19 % (ref 14.0–48.0)
MCH: 28.4 pg (ref 28.0–33.4)
MCHC: 32 g/dL (ref 32.0–35.9)
MCV: 89 fL (ref 82–98)
MONO#: 0.4 10*3/uL (ref 0.1–0.9)
MONO%: 5.5 % (ref 0.0–13.0)
NEUT%: 72 % (ref 40.0–80.0)
NEUTROS ABS: 4.8 10*3/uL (ref 1.5–6.5)
Platelets: 235 10*3/uL (ref 145–400)
RBC: 4.51 10*6/uL (ref 4.20–5.70)
RDW: 13.3 % (ref 11.1–15.7)
WBC: 6.6 10*3/uL (ref 4.0–10.0)

## 2016-09-09 LAB — CMP (CANCER CENTER ONLY)
ALBUMIN: 3.4 g/dL (ref 3.3–5.5)
ALT(SGPT): 15 U/L (ref 10–47)
AST: 18 U/L (ref 11–38)
Alkaline Phosphatase: 106 U/L — ABNORMAL HIGH (ref 26–84)
BUN, Bld: 23 mg/dL — ABNORMAL HIGH (ref 7–22)
CHLORIDE: 105 meq/L (ref 98–108)
CO2: 29 meq/L (ref 18–33)
Calcium: 8.9 mg/dL (ref 8.0–10.3)
Creat: 2 mg/dl — ABNORMAL HIGH (ref 0.6–1.2)
Glucose, Bld: 153 mg/dL — ABNORMAL HIGH (ref 73–118)
POTASSIUM: 4.3 meq/L (ref 3.3–4.7)
SODIUM: 141 meq/L (ref 128–145)
TOTAL PROTEIN: 7.9 g/dL (ref 6.4–8.1)
Total Bilirubin: 0.6 mg/dl (ref 0.20–1.60)

## 2016-09-09 NOTE — Progress Notes (Signed)
Hematology and Oncology Follow Up Visit  James Marks [id number] 06/12/1940 76 y.o. 09/09/2016   Principle Diagnosis:   IgA kappa smoldering myeloma-normal chromosomes  Current Therapy:    observation     Interim History:  James Marks is back for follow-up.  We did go ahead and do a bone marrow biopsy on him area and this was done on August 21. The bone marrow report ([id number]) showed only 6% plasma cells. There were no aggregates or sheets. This actually is slightly better than his initial bone marrow that we did on him a couple years ago.  The cytogenetics and FISH panel showed normal chromosomes.   We did his 24-hour urine. This as a showed 160 mg per day of light chain. This is improved.  The M spike that we ran on him recently only showed 0.7 g/dL. His IgA was 1234 milligrams per deciliter.   He does have his neuropathy. I don't think this is from the smoldering myeloma.  He definitely needs to see his dermatologist. He really does not like seeing doctors.   He's had no fever. He's had no cough. There's been no change in bowel or bladder habits. He does have some nocturia.   His appetite is good. He's had no nausea or vomiting. He's had no reflux.   Overall, his performance status is ECOG 1   Medications:  Current Outpatient Prescriptions:  .  lisinopril-hydrochlorothiazide (PRINZIDE,ZESTORETIC) 10-12.5 MG tablet, Take 1 tablet by mouth daily., Disp: 90 tablet, Rfl: 1 No current facility-administered medications for this visit.   Facility-Administered Medications Ordered in Other Visits:  .  0.9 %  sodium chloride infusion, , Intravenous, Continuous, ,  R, MD, Stopped at 08/05/14 1710  Allergies: No Known Allergies  Past Medical History, Surgical history, Social history, and Family History were reviewed and updated.  Review of Systems: As stated in the interim history  Physical Exam:  weight is 176 lb (79.8 kg). His oral temperature is 98.1 F  (36.7 C). His blood pressure is 111/58 (abnormal) and his pulse is 69. His respiration is 18 and oxygen saturation is 99%.   Wt Readings from Last 3 Encounters:  09/09/16 176 lb (79.8 kg)  08/31/16 174 lb 6.4 oz (79.1 kg)  08/02/16 174 lb (78.9 kg)     Physical Exam  Constitutional: He is oriented to person, place, and time.  HENT:  Head: Normocephalic and atraumatic.  Mouth/Throat: Oropharynx is clear and moist.  Eyes: Pupils are equal, round, and reactive to light. EOM are normal.  Neck: Normal range of motion.  Cardiovascular: Normal rate, regular rhythm and normal heart sounds.   Pulmonary/Chest: Effort normal and breath sounds normal.  Abdominal: Soft. Bowel sounds are normal.  Musculoskeletal: Normal range of motion. He exhibits no edema, tenderness or deformity.  Lymphadenopathy:    He has no cervical adenopathy.  Neurological: He is alert and oriented to person, place, and time.  Skin: Skin is warm and dry. No rash noted. No erythema.  Psychiatric: He has a normal mood and affect. His behavior is normal. Judgment and thought content normal.  Vitals reviewed.    Lab Results  Component Value Date   WBC 6.6 09/09/2016   HGB 12.8 (L) 09/09/2016   HCT 40.0 09/09/2016   MCV 89 09/09/2016   PLT 235 09/09/2016     Chemistry      Component Value Date/Time   NA 141 09/09/2016 1010   NA 139 08/02/2016 1146   K 4.3 09/09/2016 1010     K 4.7 08/02/2016 1146   CL 105 09/09/2016 1010   CO2 29 09/09/2016 1010   CO2 24 08/02/2016 1146   BUN 23 (H) 09/09/2016 1010   BUN 34.3 (H) 08/02/2016 1146   CREATININE 2.0 (H) 09/09/2016 1010   CREATININE 1.7 (H) 08/02/2016 1146      Component Value Date/Time   CALCIUM 8.9 09/09/2016 1010   CALCIUM 9.5 08/02/2016 1146   ALKPHOS 106 (H) 09/09/2016 1010   ALKPHOS 115 08/02/2016 1146   AST 18 09/09/2016 1010   AST 14 08/02/2016 1146   ALT 15 09/09/2016 1010   ALT 9 08/02/2016 1146   BILITOT 0.60 09/09/2016 1010   BILITOT 0.41  08/02/2016 1146         Impression and Plan: James Marks is 76-year-old man with IgA kappa smoldering myeloma.   I'm glad that we did the bone marrow test on him. I done this really was helpful and it further identified whether or not we had to embark upon therapy.  I just do not see need to treat him. I'm not sure what we would be gaining. We have been following him for a few years. We'll continue to follow him.  I will see him back in 2 more months. I think if all looks good, then maybe we can remove his appointments out to every 6 months.  I spent about 30 minutes with he and his wife. I reviewed the bone marrow results and his labs. I answered all their questions. They have a good understanding of what he has and appreciates the fact that we don't have to treat him..   , R, MD 8/30/20181:38 PM 

## 2016-09-10 LAB — IGG, IGA, IGM
IgA, Qn, Serum: 1304 mg/dL — ABNORMAL HIGH (ref 61–437)
IgG, Qn, Serum: 1074 mg/dL (ref 700–1600)
IgM, Qn, Serum: 73 mg/dL (ref 15–143)

## 2016-09-10 LAB — KAPPA/LAMBDA LIGHT CHAINS
IG KAPPA FREE LIGHT CHAIN: 82.6 mg/L — AB (ref 3.3–19.4)
Ig Lambda Free Light Chain: 22.4 mg/L (ref 5.7–26.3)
KAPPA/LAMBDA FLC RATIO: 3.69 — AB (ref 0.26–1.65)

## 2016-09-14 LAB — PROTEIN ELECTROPHORESIS, SERUM, WITH REFLEX
A/G RATIO SPE: 1 (ref 0.7–1.7)
ALBUMIN: 3.7 g/dL (ref 2.9–4.4)
ALPHA 1: 0.2 g/dL (ref 0.0–0.4)
Alpha 2: 0.7 g/dL (ref 0.4–1.0)
BETA: 1.5 g/dL — AB (ref 0.7–1.3)
GAMMA GLOBULIN: 1.2 g/dL (ref 0.4–1.8)
GLOBULIN, TOTAL: 3.6 g/dL (ref 2.2–3.9)
Interpretation(See Below): 0
M-SPIKE, %: 0.7 g/dL — AB
TOTAL PROTEIN: 7.3 g/dL (ref 6.0–8.5)

## 2016-09-15 ENCOUNTER — Ambulatory Visit: Payer: Medicare Other | Admitting: Hematology & Oncology

## 2016-09-15 ENCOUNTER — Other Ambulatory Visit: Payer: Medicare Other

## 2016-09-30 ENCOUNTER — Encounter: Payer: Self-pay | Admitting: Family Medicine

## 2016-10-04 ENCOUNTER — Encounter (HOSPITAL_COMMUNITY): Payer: Self-pay

## 2016-10-08 DIAGNOSIS — L821 Other seborrheic keratosis: Secondary | ICD-10-CM | POA: Diagnosis not present

## 2016-10-08 DIAGNOSIS — D485 Neoplasm of uncertain behavior of skin: Secondary | ICD-10-CM | POA: Diagnosis not present

## 2016-10-08 DIAGNOSIS — L570 Actinic keratosis: Secondary | ICD-10-CM | POA: Diagnosis not present

## 2016-10-08 DIAGNOSIS — L820 Inflamed seborrheic keratosis: Secondary | ICD-10-CM | POA: Diagnosis not present

## 2016-10-08 LAB — TISSUE HYBRIDIZATION (BONE MARROW)-NCBH

## 2016-10-08 LAB — CHROMOSOME ANALYSIS, BONE MARROW

## 2016-10-10 ENCOUNTER — Other Ambulatory Visit: Payer: Self-pay | Admitting: Family Medicine

## 2016-10-20 ENCOUNTER — Ambulatory Visit (INDEPENDENT_AMBULATORY_CARE_PROVIDER_SITE_OTHER): Payer: Medicare Other | Admitting: Family Medicine

## 2016-10-20 ENCOUNTER — Encounter: Payer: Self-pay | Admitting: Family Medicine

## 2016-10-20 VITALS — BP 110/62 | HR 72 | Temp 97.8°F | Resp 16 | Ht 74.0 in | Wt 177.0 lb

## 2016-10-20 DIAGNOSIS — I10 Essential (primary) hypertension: Secondary | ICD-10-CM | POA: Diagnosis not present

## 2016-10-20 DIAGNOSIS — Z23 Encounter for immunization: Secondary | ICD-10-CM

## 2016-10-20 NOTE — Progress Notes (Signed)
Subjective:     Patient ID: James Marks, male   DOB: Oct 30, 1940, 76 y.o.   MRN: [id number]  HPI Patient seen for follow-up hypertension. He has multiple myeloma followed by oncology. He also some chronic kidney disease with baseline creatinine around 2.0. He's had multiple labs recently per oncology. He remains on lisinopril HCTZ and blood pressures been well controlled. No dizziness. No chest pains. No headaches.  Patient needs flu vaccine. No contraindications.  He's having some insomnia. He has he states 3 factors that contribute to this including occasional neuropathy symptoms at night, intermittent tinnitus, and occasional nocturia. He does not feel his nocturia symptoms are severe enough to justify additional medication at this time  Past Medical History:  Diagnosis Date  . Cancer ([place])    myeloma  . Diverticulitis   . Hearing loss   . Hx of colonic polyps   . Hyperlipidemia   . Hypertension   . Kidney stones   . Osteopenia   . Smoldering multiple myeloma ([place]) 08/26/2014  . Subarachnoid hemorrhage ([place]) 2/01   nontraumatic   Past Surgical History:  Procedure Laterality Date  . None      reports that he quit smoking about 4 years ago. His smoking use included Cigarettes. He started smoking about 64 years ago. He has a 44.00 pack-year smoking history. He quit smokeless tobacco use about 4 years ago. He reports that he drinks alcohol. He reports that he does not use drugs. family history includes Cancer in his mother; Diabetes in his mother and unknown relative; Diabetes Mellitus II in his brother; Hypertension in his father; Macular degeneration in his brother. No Known Allergies   Review of Systems  Constitutional: Negative for fatigue.  Eyes: Negative for visual disturbance.  Respiratory: Negative for cough, chest tightness and shortness of breath.   Cardiovascular: Negative for chest pain, palpitations and leg swelling.  Neurological: Negative for dizziness, syncope,  weakness, light-headedness and headaches.       Objective:   Physical Exam  Constitutional: He is oriented to person, place, and time. He appears well-developed and well-nourished.  HENT:  Right Ear: External ear normal.  Left Ear: External ear normal.  Mouth/Throat: Oropharynx is clear and moist.  Eyes: Pupils are equal, round, and reactive to light.  Neck: Neck supple. No thyromegaly present.  Cardiovascular: Normal rate and regular rhythm.   Pulmonary/Chest: Effort normal and breath sounds normal. No respiratory distress. He has no wheezes. He has no rales.  Musculoskeletal: He exhibits no edema.  Neurological: He is alert and oriented to person, place, and time.       Assessment:     #1 hypertension stable and at goal  #2 chronic kidney disease  #3 multiple myeloma followed closely by oncology    Plan:     -Flu vaccine given -We did not get any lab work today since he had multiple labs recently per oncology -Offered Medicare wellness visit and he declines -Discussed new shingles vaccine and he is not interested  Eulas Post MD [facility]

## 2016-11-01 ENCOUNTER — Ambulatory Visit: Payer: Medicare Other | Admitting: Hematology & Oncology

## 2016-11-01 ENCOUNTER — Other Ambulatory Visit: Payer: Medicare Other

## 2016-12-01 ENCOUNTER — Other Ambulatory Visit: Payer: Self-pay | Admitting: Hematology & Oncology

## 2016-12-01 DIAGNOSIS — D472 Monoclonal gammopathy: Secondary | ICD-10-CM

## 2016-12-01 DIAGNOSIS — C9000 Multiple myeloma not having achieved remission: Secondary | ICD-10-CM

## 2016-12-03 ENCOUNTER — Other Ambulatory Visit: Payer: Self-pay | Admitting: *Deleted

## 2016-12-03 DIAGNOSIS — C9000 Multiple myeloma not having achieved remission: Secondary | ICD-10-CM

## 2016-12-03 DIAGNOSIS — D472 Monoclonal gammopathy: Secondary | ICD-10-CM

## 2016-12-06 ENCOUNTER — Other Ambulatory Visit (HOSPITAL_BASED_OUTPATIENT_CLINIC_OR_DEPARTMENT_OTHER): Payer: Medicare Other

## 2016-12-06 ENCOUNTER — Other Ambulatory Visit: Payer: Self-pay

## 2016-12-06 ENCOUNTER — Encounter: Payer: Self-pay | Admitting: Hematology & Oncology

## 2016-12-06 ENCOUNTER — Ambulatory Visit (HOSPITAL_BASED_OUTPATIENT_CLINIC_OR_DEPARTMENT_OTHER): Payer: Medicare Other | Admitting: Hematology & Oncology

## 2016-12-06 VITALS — BP 115/56 | HR 68 | Temp 97.5°F | Resp 18 | Wt 178.0 lb

## 2016-12-06 DIAGNOSIS — C9000 Multiple myeloma not having achieved remission: Secondary | ICD-10-CM

## 2016-12-06 DIAGNOSIS — D472 Monoclonal gammopathy: Secondary | ICD-10-CM

## 2016-12-06 DIAGNOSIS — G629 Polyneuropathy, unspecified: Secondary | ICD-10-CM

## 2016-12-06 LAB — CBC WITH DIFFERENTIAL (CANCER CENTER ONLY)
BASO#: 0 10*3/uL (ref 0.0–0.2)
BASO%: 0.4 % (ref 0.0–2.0)
EOS%: 1.4 % (ref 0.0–7.0)
Eosinophils Absolute: 0.1 10*3/uL (ref 0.0–0.5)
HEMATOCRIT: 39 % (ref 38.7–49.9)
HEMOGLOBIN: 12.7 g/dL — AB (ref 13.0–17.1)
LYMPH#: 1.3 10*3/uL (ref 0.9–3.3)
LYMPH%: 16.9 % (ref 14.0–48.0)
MCH: 28.7 pg (ref 28.0–33.4)
MCHC: 32.6 g/dL (ref 32.0–35.9)
MCV: 88 fL (ref 82–98)
MONO#: 0.4 10*3/uL (ref 0.1–0.9)
MONO%: 5.4 % (ref 0.0–13.0)
NEUT%: 75.9 % (ref 40.0–80.0)
NEUTROS ABS: 5.9 10*3/uL (ref 1.5–6.5)
Platelets: 237 10*3/uL (ref 145–400)
RBC: 4.43 10*6/uL (ref 4.20–5.70)
RDW: 12.9 % (ref 11.1–15.7)
WBC: 7.7 10*3/uL (ref 4.0–10.0)

## 2016-12-06 LAB — CMP (CANCER CENTER ONLY)
ALBUMIN: 3.4 g/dL (ref 3.3–5.5)
ALK PHOS: 108 U/L — AB (ref 26–84)
ALT: 18 U/L (ref 10–47)
AST: 17 U/L (ref 11–38)
BILIRUBIN TOTAL: 0.5 mg/dL (ref 0.20–1.60)
BUN, Bld: 29 mg/dL — ABNORMAL HIGH (ref 7–22)
CALCIUM: 9.1 mg/dL (ref 8.0–10.3)
CO2: 28 meq/L (ref 18–33)
Chloride: 107 mEq/L (ref 98–108)
Creat: 2.1 mg/dl — ABNORMAL HIGH (ref 0.6–1.2)
GLUCOSE: 120 mg/dL — AB (ref 73–118)
POTASSIUM: 4.2 meq/L (ref 3.3–4.7)
Sodium: 143 mEq/L (ref 128–145)
Total Protein: 7.5 g/dL (ref 6.4–8.1)

## 2016-12-06 MED ORDER — GABAPENTIN 300 MG PO CAPS: ORAL_CAPSULE | ORAL | 3 refills | Status: DC
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
## 2016-12-07 LAB — UIFE/LIGHT CHAINS/TP QN, 24-HR UR
FR KAPPA LT CH,24HR: 416 mg/24 hr
FR LAMBDA LT CH,24HR: 8 mg/(24.h)
FREE LAMBDA LT CHAINS, UR: 4.4 mg/L (ref 0.24–6.66)
Free Kappa Lt Chains,Ur: 231 mg/L — ABNORMAL HIGH (ref 1.35–24.19)
Kappa/Lambda Ratio,U: 52.5 — ABNORMAL HIGH (ref 2.04–10.37)
PROTEIN UR: 7.1 mg/dL
Prot,24hr calculated: 128 mg/24 hr (ref 30–150)

## 2016-12-07 LAB — KAPPA/LAMBDA LIGHT CHAINS
Ig Kappa Free Light Chain: 83.2 mg/L — ABNORMAL HIGH (ref 3.3–19.4)
Ig Lambda Free Light Chain: 24 mg/L (ref 5.7–26.3)
Kappa/Lambda FluidC Ratio: 3.47 — ABNORMAL HIGH (ref 0.26–1.65)

## 2016-12-07 LAB — IGG, IGA, IGM
IgA, Qn, Serum: 1213 mg/dL — ABNORMAL HIGH (ref 61–437)
IgM, Qn, Serum: 72 mg/dL (ref 15–143)

## 2016-12-09 LAB — PROTEIN ELECTROPHORESIS, SERUM, WITH REFLEX
A/G Ratio: 0.9 (ref 0.7–1.7)
ALPHA 1: 0.2 g/dL (ref 0.0–0.4)
Albumin: 3.4 g/dL (ref 2.9–4.4)
Alpha 2: 0.7 g/dL (ref 0.4–1.0)
Beta: 1.7 g/dL — ABNORMAL HIGH (ref 0.7–1.3)
Gamma Globulin: 1 g/dL (ref 0.4–1.8)
Globulin, Total: 3.6 g/dL (ref 2.2–3.9)
INTERPRETATION(SEE BELOW): 0
M-SPIKE, %: 0.4 g/dL — AB
Total Protein: 7 g/dL (ref 6.0–8.5)

## 2017-01-08 ENCOUNTER — Other Ambulatory Visit: Payer: Self-pay | Admitting: Family Medicine

## 2017-03-03 ENCOUNTER — Other Ambulatory Visit: Payer: Self-pay

## 2017-03-03 DIAGNOSIS — D472 Monoclonal gammopathy: Secondary | ICD-10-CM

## 2017-03-03 DIAGNOSIS — C9000 Multiple myeloma not having achieved remission: Secondary | ICD-10-CM

## 2017-03-07 ENCOUNTER — Other Ambulatory Visit: Payer: Self-pay

## 2017-03-07 ENCOUNTER — Inpatient Hospital Stay: Payer: Medicare Other

## 2017-03-07 ENCOUNTER — Inpatient Hospital Stay: Payer: Medicare Other | Attending: Hematology & Oncology | Admitting: Hematology & Oncology

## 2017-03-07 VITALS — BP 112/54 | HR 71 | Temp 97.8°F | Resp 20 | Wt 184.1 lb

## 2017-03-07 DIAGNOSIS — D472 Monoclonal gammopathy: Secondary | ICD-10-CM

## 2017-03-07 DIAGNOSIS — Z79899 Other long term (current) drug therapy: Secondary | ICD-10-CM | POA: Insufficient documentation

## 2017-03-07 DIAGNOSIS — C9000 Multiple myeloma not having achieved remission: Secondary | ICD-10-CM

## 2017-03-07 LAB — CBC WITH DIFFERENTIAL (CANCER CENTER ONLY)
BASOS PCT: 1 %
Basophils Absolute: 0 10*3/uL (ref 0.0–0.1)
EOS ABS: 0.2 10*3/uL (ref 0.0–0.5)
Eosinophils Relative: 3 %
HCT: 39.4 % (ref 38.7–49.9)
HEMOGLOBIN: 12.8 g/dL — AB (ref 13.0–17.1)
LYMPHS ABS: 1.3 10*3/uL (ref 0.9–3.3)
Lymphocytes Relative: 19 %
MCH: 28.5 pg (ref 28.0–33.4)
MCHC: 32.5 g/dL (ref 32.0–35.9)
MCV: 87.8 fL (ref 82.0–98.0)
Monocytes Absolute: 0.6 10*3/uL (ref 0.1–0.9)
Monocytes Relative: 9 %
NEUTROS ABS: 4.6 10*3/uL (ref 1.5–6.5)
NEUTROS PCT: 68 %
Platelet Count: 259 10*3/uL (ref 145–400)
RBC: 4.49 MIL/uL (ref 4.20–5.70)
RDW: 13.2 % (ref 11.1–15.7)
WBC: 6.8 10*3/uL (ref 4.0–10.0)

## 2017-03-07 LAB — CMP (CANCER CENTER ONLY)
ALBUMIN: 3.5 g/dL (ref 3.5–5.0)
ALT: 9 U/L (ref 0–55)
AST: 13 U/L (ref 5–34)
Alkaline Phosphatase: 114 U/L (ref 40–150)
Anion gap: 9 (ref 3–11)
BUN: 30 mg/dL — ABNORMAL HIGH (ref 7–26)
CALCIUM: 9.2 mg/dL (ref 8.4–10.4)
CO2: 25 mmol/L (ref 22–29)
CREATININE: 1.81 mg/dL — AB (ref 0.70–1.30)
Chloride: 106 mmol/L (ref 98–109)
GFR, Est AFR Am: 40 mL/min — ABNORMAL LOW (ref >60)
GFR, Estimated: 34 mL/min — ABNORMAL LOW (ref >60)
GLUCOSE: 105 mg/dL (ref 70–140)
Potassium: 4.7 mmol/L (ref 3.5–5.1)
SODIUM: 140 mmol/L (ref 136–145)
Total Bilirubin: 0.3 mg/dL (ref 0.2–1.2)
Total Protein: 7.6 g/dL (ref 6.4–8.3)

## 2017-03-07 MED ORDER — GABAPENTIN 300 MG PO CAPS: ORAL_CAPSULE | ORAL | 9 refills | Status: DC

## 2017-03-07 NOTE — Progress Notes (Signed)
Hematology and Oncology Follow Up Visit  James Marks [id number] August 15, 1940 77 y.o. 03/07/2017   Principle Diagnosis:   IgA kappa smoldering myeloma-normal chromosomes  Current Therapy:    Observation     Interim History:  Mr. Burggraf is back for follow-up.  He is doing okay.  He is feeling okay.  His main complaint has been a little bit of memory difficulty.  He thinks this is from the Neurontin.  I would find this hard to believe since he is on been on Neurontin for about 3 months.  He said that the Neurontin really helped him out.  He said that it made him feel better.  He does not have as much neuropathy in his legs.  He is sleeping better.  His last myeloma studies done back in November showed an M spike of 0.4 g/dL.  His IgA level is 1213 mg/dL.  His Kappa Lightchain is 8.3 mg/dL.  All of these are holding stable.  A 24-hour urine was done in November.  His Kappa Lightchain excretion was 460 mg/day.  He is playing golf.  He is trying to stay active.  His appetite is good.  He has had no nausea or vomiting.  Is had no cough.  Is had no shortness of breath.  Overall, his performance status is ECOG 2.   Medications:  Current Outpatient Medications:  .  gabapentin (NEURONTIN) 300 MG capsule, Take 1 capsule with breakfast and dinner, then 2 at bedtime., Disp: 120 capsule, Rfl: 3 .  lisinopril-hydrochlorothiazide (PRINZIDE,ZESTORETIC) 10-12.5 MG tablet, Take 1 tablet by mouth daily., Disp: 90 tablet, Rfl: 1 .  lisinopril-hydrochlorothiazide (PRINZIDE,ZESTORETIC) 10-12.5 MG tablet, TAKE 1 TABLET DAILY, Disp: 90 tablet, Rfl: 1 No current facility-administered medications for this visit.   Facility-Administered Medications Ordered in Other Visits:  .  0.9 %  sodium chloride infusion, , Intravenous, Continuous, Patriciann Becht, Rudell Cobb, MD, Stopped at 08/05/14 1710  Allergies: No Known Allergies  Past Medical History, Surgical history, Social history, and Family History were reviewed  and updated.  Review of Systems: Review of Systems  Constitutional: Positive for malaise/fatigue.  HENT: Positive for congestion.   Eyes: Negative.   Respiratory: Positive for shortness of breath.   Cardiovascular: Positive for palpitations.  Gastrointestinal: Negative.   Genitourinary: Negative.   Musculoskeletal: Positive for back pain and myalgias.  Skin: Negative.   Neurological: Positive for tingling.  Endo/Heme/Allergies: Negative.   Psychiatric/Behavioral: Negative.     Physical Exam:  weight is 184 lb 1.9 oz (83.5 kg). His oral temperature is 97.8 F (36.6 C). His blood pressure is 112/54 (abnormal) and his pulse is 71. His respiration is 20 and oxygen saturation is 98%.   Wt Readings from Last 3 Encounters:  03/07/17 184 lb 1.9 oz (83.5 kg)  12/06/16 178 lb (80.7 kg)  10/20/16 177 lb (80.3 kg)     Physical Exam  Constitutional: He is oriented to person, place, and time.  HENT:  Head: Normocephalic and atraumatic.  Mouth/Throat: Oropharynx is clear and moist.  Eyes: EOM are normal. Pupils are equal, round, and reactive to light.  Neck: Normal range of motion.  Cardiovascular: Normal rate, regular rhythm and normal heart sounds.  Pulmonary/Chest: Effort normal and breath sounds normal.  Abdominal: Soft. Bowel sounds are normal.  Musculoskeletal: Normal range of motion. He exhibits no edema, tenderness or deformity.  Lymphadenopathy:    He has no cervical adenopathy.  Neurological: He is alert and oriented to person, place, and time.  Skin: Skin is  warm and dry. No rash noted. No erythema.  Psychiatric: He has a normal mood and affect. His behavior is normal. Judgment and thought content normal.  Vitals reviewed.    Lab Results  Component Value Date   WBC 6.8 03/07/2017   HGB 12.7 (L) 12/06/2016   HCT 39.4 03/07/2017   MCV 87.8 03/07/2017   PLT 259 03/07/2017     Chemistry      Component Value Date/Time   NA 140 03/07/2017 0905   NA 143 12/06/2016  1156   NA 139 08/02/2016 1146   K 4.7 03/07/2017 0905   K 4.2 12/06/2016 1156   K 4.7 08/02/2016 1146   CL 106 03/07/2017 0905   CL 107 12/06/2016 1156   CO2 25 03/07/2017 0905   CO2 28 12/06/2016 1156   CO2 24 08/02/2016 1146   BUN 30 (H) 03/07/2017 0905   BUN 29 (H) 12/06/2016 1156   BUN 34.3 (H) 08/02/2016 1146   CREATININE 1.81 (H) 03/07/2017 0905   CREATININE 2.1 (H) 12/06/2016 1156   CREATININE 1.7 (H) 08/02/2016 1146      Component Value Date/Time   CALCIUM 9.2 03/07/2017 0905   CALCIUM 9.1 12/06/2016 1156   CALCIUM 9.5 08/02/2016 1146   ALKPHOS 114 03/07/2017 0905   ALKPHOS 108 (H) 12/06/2016 1156   ALKPHOS 115 08/02/2016 1146   AST 13 03/07/2017 0905   AST 14 08/02/2016 1146   ALT 9 03/07/2017 0905   ALT 18 12/06/2016 1156   ALT 9 08/02/2016 1146   BILITOT 0.3 03/07/2017 0905   BILITOT 0.41 08/02/2016 1146         Impression and Plan: Mr. Knightly is 77 year old man with IgA kappa smoldering myeloma.   Everything is doing well for him.  His numbers look good.  His myeloma levels look fantastic.  I will plan to see him back in 6 months now.  I just do not see that we need to treat the smoldering myeloma.  I still do not believe that his neuropathy is from myeloma.  I spent about 30 minutes with he and his wife.  Over 50% of the time was spent face-to-face reviewing all of his lab work.  [name], MD 2/25/20192:29 PM

## 2017-03-08 LAB — KAPPA/LAMBDA LIGHT CHAINS
Kappa free light chain: 107.4 mg/L — ABNORMAL HIGH (ref 3.3–19.4)
Kappa, lambda light chain ratio: 4.65 — ABNORMAL HIGH (ref 0.26–1.65)
LAMDA FREE LIGHT CHAINS: 23.1 mg/L (ref 5.7–26.3)

## 2017-03-08 LAB — UIFE/LIGHT CHAINS/TP QN, 24-HR UR
% BETA, URINE: 48.9 %
ALBUMIN, U: 16.2 %
ALPHA 1 URINE: 2 %
ALPHA 2 UR: 7.2 %
FREE KAPPA/LAMBDA RATIO: 51.12 — AB (ref 2.04–10.37)
Free Kappa Lt Chains,Ur: 229 mg/L — ABNORMAL HIGH (ref 1.35–24.19)
Free Lambda Lt Chains,Ur: 4.48 mg/L (ref 0.24–6.66)
GAMMA GLOBULIN URINE: 25.7 %
M-SPIKE %, URINE: 26.7 % — AB
M-Spike, Mg/24 Hr: 26 mg/24 hr — ABNORMAL HIGH
TOTAL PROTEIN, URINE-UPE24: 5.8 mg/dL
Total Protein, Urine-Ur/day: 99 mg/24 hr (ref 30–150)
Total Volume: 1700

## 2017-03-08 LAB — IGG, IGA, IGM
IGM (IMMUNOGLOBULIN M), SRM: 72 mg/dL (ref 15–143)
IgA: 1275 mg/dL — ABNORMAL HIGH (ref 61–437)
IgG (Immunoglobin G), Serum: 1070 mg/dL (ref 700–1600)

## 2017-03-08 LAB — BETA 2 MICROGLOBULIN, SERUM: BETA 2 MICROGLOBULIN: 3.7 mg/L — AB (ref 0.6–2.4)

## 2017-03-09 ENCOUNTER — Telehealth: Payer: Self-pay | Admitting: *Deleted

## 2017-03-09 NOTE — Telephone Encounter (Addendum)
Patient's wife is aware of results  ----- Message from [name], MD sent at 03/09/2017  5:56 AM EST ----- Call - the urine protein is stable at 229!!  The last level was 231.  greeat news!!  James Marks

## 2017-03-10 LAB — PROTEIN ELECTROPHORESIS, SERUM, WITH REFLEX
A/G Ratio: 1.1 (ref 0.7–1.7)
Albumin ELP: 3.7 g/dL (ref 2.9–4.4)
Alpha-1-Globulin: 0.2 g/dL (ref 0.0–0.4)
Alpha-2-Globulin: 0.6 g/dL (ref 0.4–1.0)
BETA GLOBULIN: 1.6 g/dL — AB (ref 0.7–1.3)
Gamma Globulin: 0.9 g/dL (ref 0.4–1.8)
Globulin, Total: 3.4 g/dL (ref 2.2–3.9)
M-Spike, %: 0.6 g/dL — ABNORMAL HIGH
SPEP INTERP: 0
Total Protein ELP: 7.1 g/dL (ref 6.0–8.5)

## 2017-03-27 IMAGING — CT CT CHEST W/ CM
2 of 3 series · 15 of 36 positions shown, 18 images · IV contrast (omnipaque)
Comparison: None.

CLINICAL DATA: Axillary adenopathy, palpable right axillary lymph
node, monoclonal gammopathy of unknown significance, former smoker.

EXAM:
CT CHEST WITH CONTRAST
TECHNIQUE: Multidetector CT imaging of the chest was performed during
intravenous contrast administration.
CONTRAST:  75mL OMNIPAQUE IOHEXOL 300 MG/ML  SOLN

[Series 2: chest 5.0 b31f · axial · 0.79mm/px · z∈[-344,-34]mm · 12 of 74 slices shown, 15 images]
[im 6/74  mediastinal]
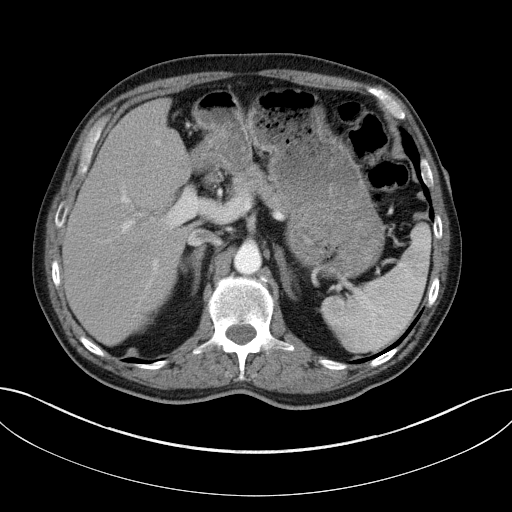
[im 6/74  lung]
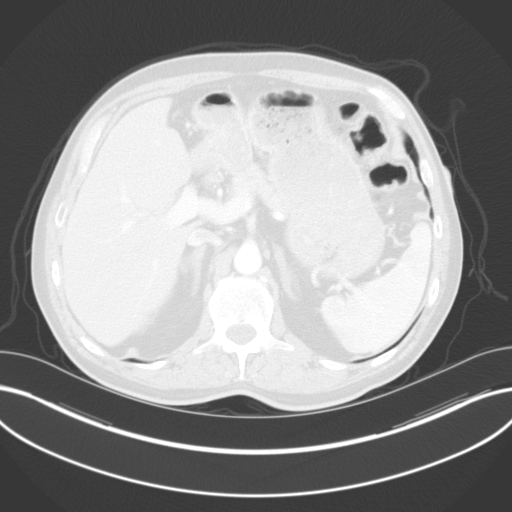
[im 11/74  lung]
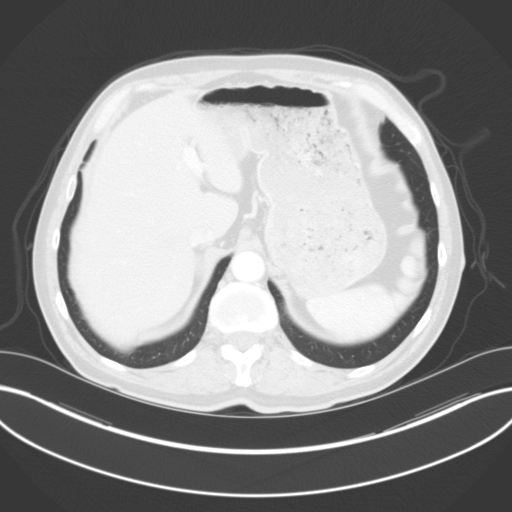
[im 17/74  lung]
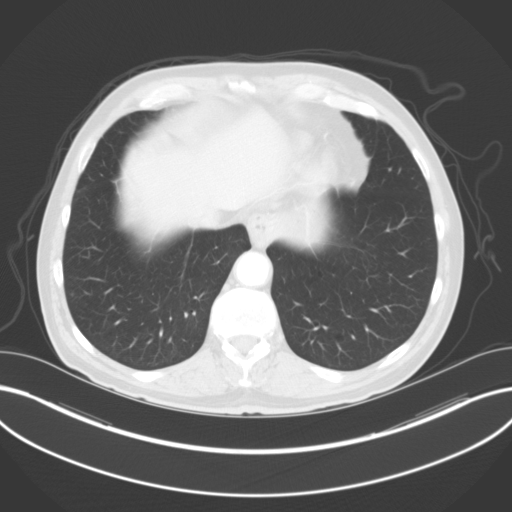
[im 22/74  lung]
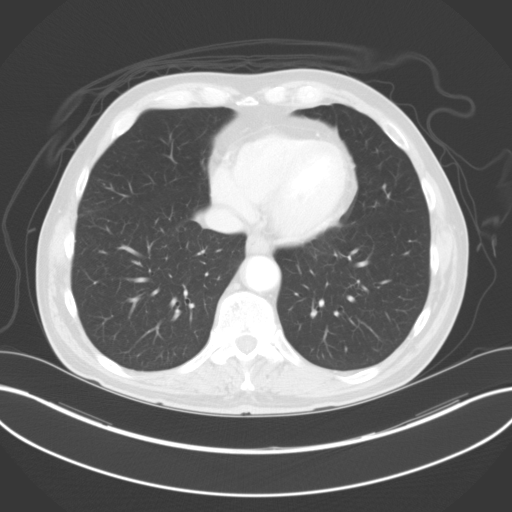
[im 28/74  mediastinal]
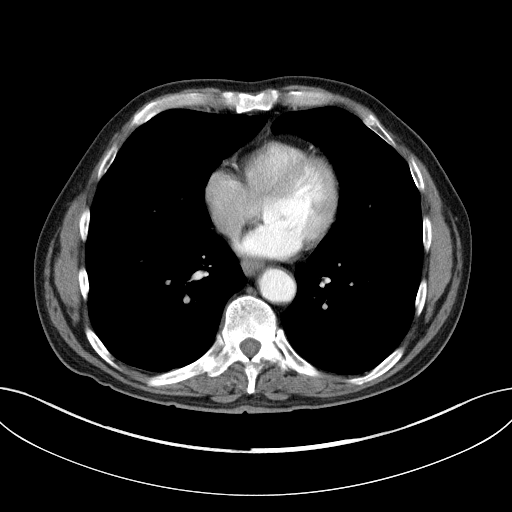
[im 28/74  lung]
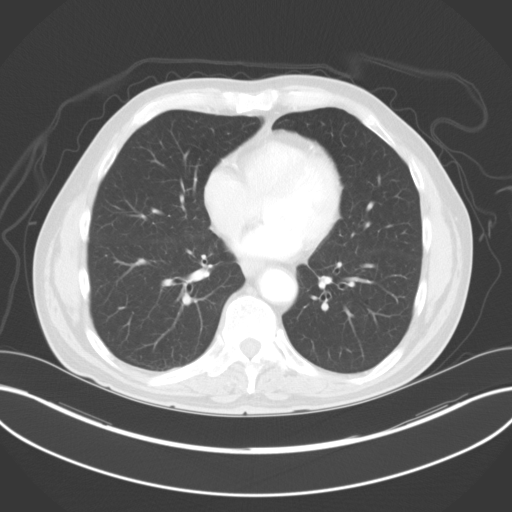
[im 33/74  lung]
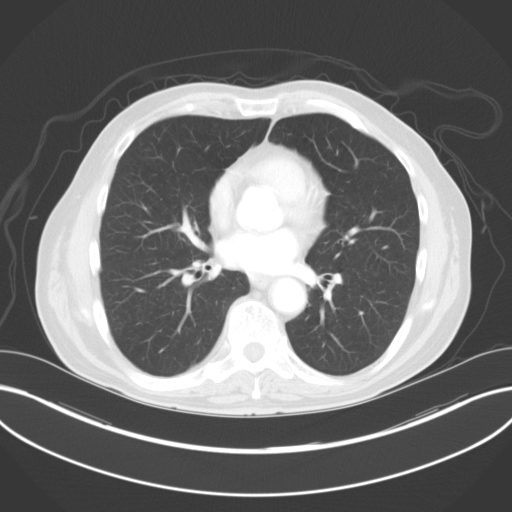
[im 41/74  lung]
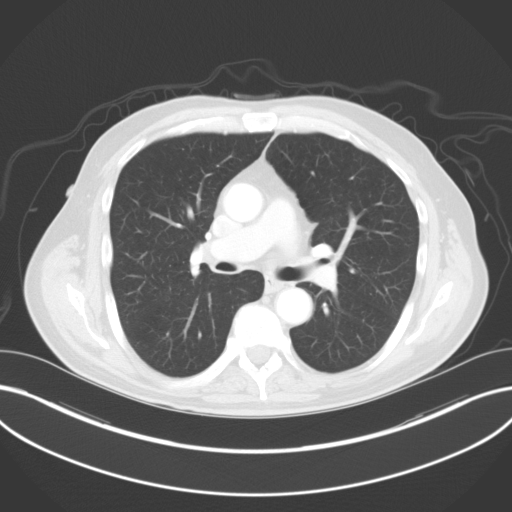
[im 46/74  lung]
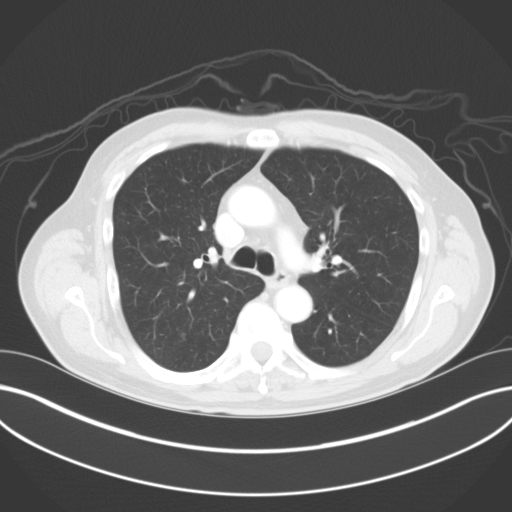
[im 52/74  mediastinal]
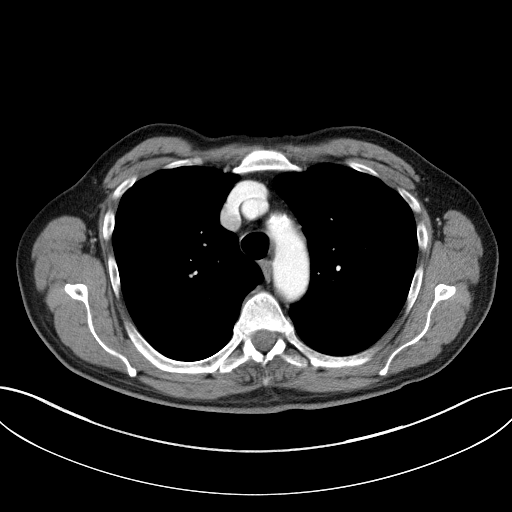
[im 52/74  lung]
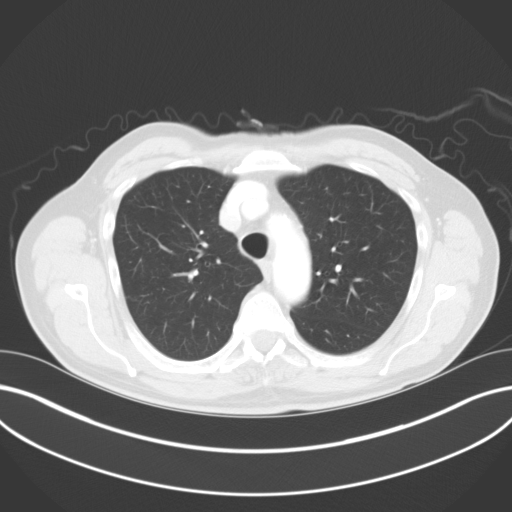
[im 57/74  lung]
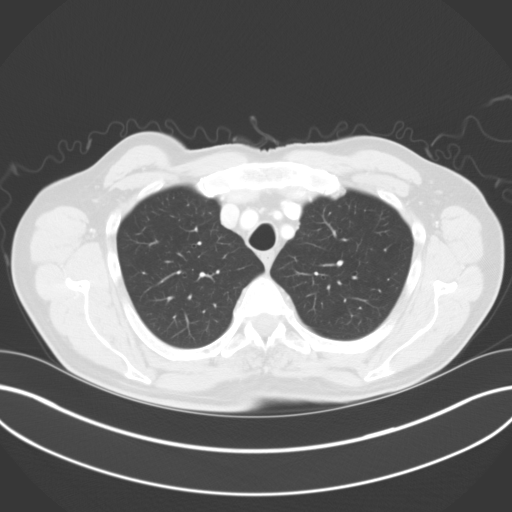
[im 63/74  lung]
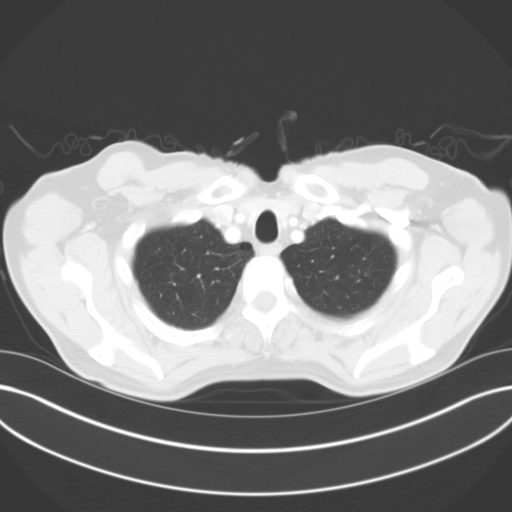
[im 68/74  lung]
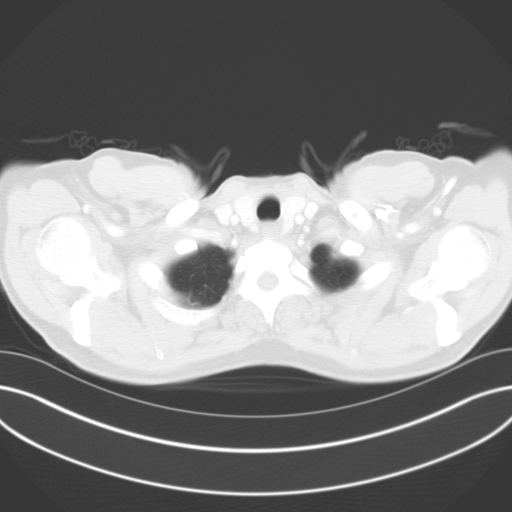

[Series 6: chest 3.0 coronal · coronal · 0.74mm/px · 3 of 96 slices shown]
[im 20/96  lung]
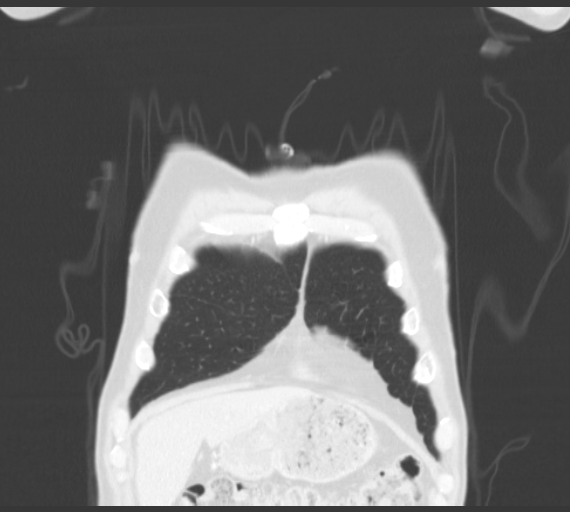
[im 39/96  lung]
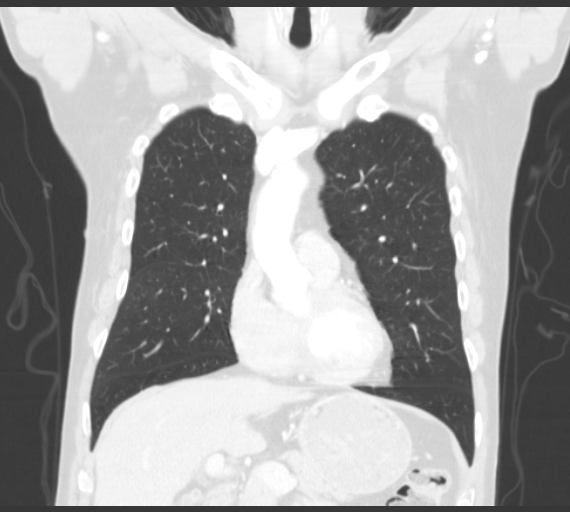
[im 58/96  lung]
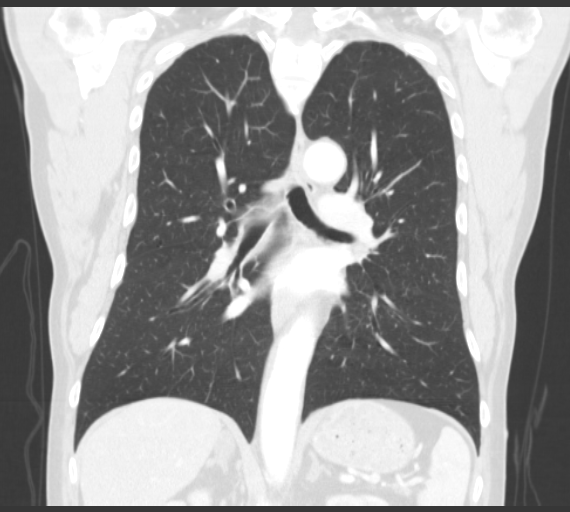

[15 of 36 positions shown; findings below may reference images not displayed]

FINDINGS: Mediastinum/Nodes: No pathologically enlarged mediastinal, hilar or
axillary lymph nodes. Atherosclerotic calcification of the arterial
vasculature, including coronary arteries. Heart size normal. No
pericardial effusion.

Lungs/Pleura: Lungs are clear. No pleural fluid. Airway is
unremarkable.

Upper abdomen: There may be a 6 mm low-attenuation lesion in the
caudate, versus volume averaging with adjacent fat. Visualized
portions of the liver are otherwise unremarkable. Low-attenuation
lesion in the right adrenal gland measures 2.1 cm and 20 Hounsfield
units. Low-attenuation lesion in the right kidney measures 10 mm,
incompletely imaged. Visualized portions of the left kidney, spleen,
pancreas, stomach and bowel are grossly unremarkable with exception
of a tiny hiatal hernia. No upper abdominal adenopathy.

Musculoskeletal: No worrisome lytic or sclerotic lesions.
IMPRESSION: 1. No pathologically enlarged lymph nodes.
2. Coronary artery calcification.
3. Right adrenal nodule is indeterminate but an adenoma is favored.

## 2017-04-05 ENCOUNTER — Ambulatory Visit (INDEPENDENT_AMBULATORY_CARE_PROVIDER_SITE_OTHER): Payer: Medicare Other | Admitting: Family Medicine

## 2017-04-05 ENCOUNTER — Encounter: Payer: Self-pay | Admitting: Family Medicine

## 2017-04-05 DIAGNOSIS — M109 Gout, unspecified: Secondary | ICD-10-CM | POA: Diagnosis not present

## 2017-04-05 MED ORDER — PREDNISONE 20 MG PO TABS: ORAL_TABLET | ORAL | 1 refills | Status: DC

## 2017-04-05 NOTE — Patient Instructions (Signed)

## 2017-04-05 NOTE — Progress Notes (Signed)
Subjective:     Patient ID: James Marks, male   DOB: 1940/01/26, 77 y.o.   MRN: [id number]  HPI   Patient seen with left foot pain. He has history of gout. Develop pain couple days ago. No injury. No fevers or chills. Last flareup was around August 2016. Has responded well to prednisone the past. He has declined prophylactic therapy. Is not sure of any acute triggers. Current pain is moderate.  Has had acute involvement of same joint in the past  Past Medical History:  Diagnosis Date  . Cancer ([place])    myeloma  . Diverticulitis   . Hearing loss   . Hx of colonic polyps   . Hyperlipidemia   . Hypertension   . Kidney stones   . Osteopenia   . Smoldering multiple myeloma ([place]) 08/26/2014  . Subarachnoid hemorrhage ([place]) 2/01   nontraumatic   Past Surgical History:  Procedure Laterality Date  . None      reports that he quit smoking about 5 years ago. His smoking use included cigarettes. He started smoking about 64 years ago. He has a 44.00 pack-year smoking history. He quit smokeless tobacco use about 4 years ago. He reports that he drinks alcohol. He reports that he does not use drugs. family history includes Cancer in his mother; Diabetes in his mother and unknown relative; Diabetes Mellitus II in his brother; Hypertension in his father; Macular degeneration in his brother. No Known Allergies   Review of Systems  Constitutional: Negative for chills and fever.  Musculoskeletal: Positive for arthralgias.       Objective:   Physical Exam  Constitutional: He appears well-developed and well-nourished.  Cardiovascular: Normal rate and regular rhythm.  Pulmonary/Chest: Effort normal and breath sounds normal. No respiratory distress. He has no wheezes. He has no rales.  Musculoskeletal:  Patient has some edema, warmth, erythema left metatarsophalangeal joint. Tender to palpation  Distal foot pulses are normal       Assessment:     Acute gout involving left  metatarsophalangeal joint    Plan:     -Prednisone 20 mg 2 tablets daily with food for 6 days -We discussed issues of prophylaxis with gout and since he is only having about one flare up every couple years he elects against this -Discussed importance of adequate hydration and avoiding high purine foods  Eulas Post MD [facility]

## 2017-07-20 ENCOUNTER — Other Ambulatory Visit: Payer: Self-pay | Admitting: Family Medicine

## 2017-08-31 ENCOUNTER — Inpatient Hospital Stay: Payer: Medicare Other | Attending: Hematology & Oncology

## 2017-08-31 ENCOUNTER — Other Ambulatory Visit: Payer: Self-pay | Admitting: Hematology & Oncology

## 2017-08-31 DIAGNOSIS — D472 Monoclonal gammopathy: Secondary | ICD-10-CM

## 2017-08-31 DIAGNOSIS — Z79899 Other long term (current) drug therapy: Secondary | ICD-10-CM | POA: Insufficient documentation

## 2017-08-31 DIAGNOSIS — C9000 Multiple myeloma not having achieved remission: Secondary | ICD-10-CM

## 2017-08-31 DIAGNOSIS — R413 Other amnesia: Secondary | ICD-10-CM | POA: Insufficient documentation

## 2017-09-05 ENCOUNTER — Other Ambulatory Visit: Payer: Self-pay

## 2017-09-05 ENCOUNTER — Inpatient Hospital Stay (HOSPITAL_BASED_OUTPATIENT_CLINIC_OR_DEPARTMENT_OTHER): Payer: Medicare Other | Admitting: Hematology & Oncology

## 2017-09-05 ENCOUNTER — Encounter: Payer: Self-pay | Admitting: Hematology & Oncology

## 2017-09-05 ENCOUNTER — Inpatient Hospital Stay: Payer: Medicare Other

## 2017-09-05 ENCOUNTER — Telehealth: Payer: Self-pay | Admitting: *Deleted

## 2017-09-05 VITALS — BP 130/57 | HR 59 | Temp 97.6°F | Resp 18 | Wt 181.0 lb

## 2017-09-05 DIAGNOSIS — Z79899 Other long term (current) drug therapy: Secondary | ICD-10-CM

## 2017-09-05 DIAGNOSIS — R413 Other amnesia: Secondary | ICD-10-CM | POA: Diagnosis not present

## 2017-09-05 DIAGNOSIS — D472 Monoclonal gammopathy: Secondary | ICD-10-CM

## 2017-09-05 DIAGNOSIS — C9000 Multiple myeloma not having achieved remission: Secondary | ICD-10-CM

## 2017-09-05 LAB — CBC WITH DIFFERENTIAL (CANCER CENTER ONLY)
BASOS ABS: 0 10*3/uL (ref 0.0–0.1)
BASOS PCT: 0 %
Eosinophils Absolute: 0.1 10*3/uL (ref 0.0–0.5)
Eosinophils Relative: 2 %
HEMATOCRIT: 41.4 % (ref 38.7–49.9)
Hemoglobin: 13 g/dL (ref 13.0–17.1)
LYMPHS PCT: 18 %
Lymphs Abs: 1.3 10*3/uL (ref 0.9–3.3)
MCH: 28.1 pg (ref 28.0–33.4)
MCHC: 31.4 g/dL — ABNORMAL LOW (ref 32.0–35.9)
MCV: 89.6 fL (ref 82.0–98.0)
MONO ABS: 0.5 10*3/uL (ref 0.1–0.9)
Monocytes Relative: 7 %
NEUTROS ABS: 5.5 10*3/uL (ref 1.5–6.5)
Neutrophils Relative %: 73 %
PLATELETS: 223 10*3/uL (ref 145–400)
RBC: 4.62 MIL/uL (ref 4.20–5.70)
RDW: 13.2 % (ref 11.1–15.7)
WBC Count: 7.4 10*3/uL (ref 4.0–10.0)

## 2017-09-05 LAB — CMP (CANCER CENTER ONLY)
ALT: 14 U/L (ref 10–47)
ANION GAP: 7 (ref 5–15)
AST: 18 U/L (ref 11–38)
Albumin: 3.5 g/dL (ref 3.5–5.0)
Alkaline Phosphatase: 104 U/L — ABNORMAL HIGH (ref 26–84)
BILIRUBIN TOTAL: 0.6 mg/dL (ref 0.2–1.6)
BUN: 27 mg/dL — ABNORMAL HIGH (ref 7–22)
CALCIUM: 8.5 mg/dL (ref 8.0–10.3)
CO2: 29 mmol/L (ref 18–33)
Chloride: 101 mmol/L (ref 98–108)
Creatinine: 1.9 mg/dL — ABNORMAL HIGH (ref 0.60–1.20)
Glucose, Bld: 131 mg/dL — ABNORMAL HIGH (ref 73–118)
POTASSIUM: 5.5 mmol/L — AB (ref 3.3–4.7)
Sodium: 137 mmol/L (ref 128–145)
TOTAL PROTEIN: 7.9 g/dL (ref 6.4–8.1)

## 2017-09-05 NOTE — Telephone Encounter (Addendum)
Patient is aware of results. Appointment made.  ----- Message from [name], MD sent at 09/05/2017  2:01 PM EDT ----- Call - potassium is too high.  Need to repeat this in 1-2 days.  He needs to come back for labs only!!  James Marks

## 2017-09-05 NOTE — Progress Notes (Signed)
Hematology and Oncology Follow Up Visit  PRENTIS LANGDON [id number] [birth year] 77 y.o. 09/05/2017   Principle Diagnosis:   IgA kappa smoldering myeloma-normal chromosomes  Current Therapy:    Observation     Interim History:  Mr. Serpa is back for follow-up.  He is doing okay.  He is feeling okay.  His main complaint has been a little bit of memory difficulty.  He thinks this is from the Neurontin.  I would find this hard to believe since he is on been on Neurontin for about 3 months.  He said that the Neurontin really helped him out.  He said that it made him feel better.  He does not have as much neuropathy in his legs.  He is sleeping better.  His last myeloma studies done back in February showed an M spike of 0.6 g/dL.  His IgA level is 1275 mg/dL.  His Kappa Light chain is 10.7 mg/dL.  All of these are holding stable.  A 24-hour urine was done in February. His Kappa Light chain excretion was 229 mg/day.  He is playing golf.  He is trying to stay active.  His appetite is good.  He has had no nausea or vomiting.  Is had no cough.  Is had no shortness of breath.  Overall, his performance status is ECOG 2.   Medications:  Current Outpatient Medications:  .  gabapentin (NEURONTIN) 300 MG capsule, Take 1 capsule with breakfast and dinner, then 2 at bedtime., Disp: 120 capsule, Rfl: 9 .  lisinopril-hydrochlorothiazide (PRINZIDE,ZESTORETIC) 10-12.5 MG tablet, Take 1 tablet by mouth daily., Disp: 90 tablet, Rfl: 1 No current facility-administered medications for this visit.   Facility-Administered Medications Ordered in Other Visits:  .  0.9 %  sodium chloride infusion, , Intravenous, Continuous, Ennever, Rudell Cobb, MD, Stopped at 08/05/14 1710  Allergies: No Known Allergies  Past Medical History, Surgical history, Social history, and Family History were reviewed and updated.  Review of Systems: Review of Systems  Constitutional: Positive for malaise/fatigue.  HENT: Positive  for congestion.   Eyes: Negative.   Respiratory: Positive for shortness of breath.   Cardiovascular: Positive for palpitations.  Gastrointestinal: Negative.   Genitourinary: Negative.   Musculoskeletal: Positive for back pain and myalgias.  Skin: Negative.   Neurological: Positive for tingling.  Endo/Heme/Allergies: Negative.   Psychiatric/Behavioral: Negative.     Physical Exam:  weight is 181 lb (82.1 kg). His oral temperature is 97.6 F (36.4 C). His blood pressure is 130/57 (abnormal) and his pulse is 59 (abnormal). His respiration is 18 and oxygen saturation is 99%.   Wt Readings from Last 3 Encounters:  09/05/17 181 lb (82.1 kg)  04/05/17 181 lb 3.2 oz (82.2 kg)  03/07/17 184 lb 1.9 oz (83.5 kg)     Physical Exam  Constitutional: He is oriented to person, place, and time.  HENT:  Head: Normocephalic and atraumatic.  Mouth/Throat: Oropharynx is clear and moist.  Eyes: Pupils are equal, round, and reactive to light. EOM are normal.  Neck: Normal range of motion.  Cardiovascular: Normal rate, regular rhythm and normal heart sounds.  Pulmonary/Chest: Effort normal and breath sounds normal.  Abdominal: Soft. Bowel sounds are normal.  Musculoskeletal: Normal range of motion. He exhibits no edema, tenderness or deformity.  Lymphadenopathy:    He has no cervical adenopathy.  Neurological: He is alert and oriented to person, place, and time.  Skin: Skin is warm and dry. No rash noted. No erythema.  Psychiatric: He has a normal  mood and affect. His behavior is normal. Judgment and thought content normal.  Vitals reviewed.    Lab Results  Component Value Date   WBC 7.4 09/05/2017   HGB 13.0 09/05/2017   HCT 41.4 09/05/2017   MCV 89.6 09/05/2017   PLT 223 09/05/2017     Chemistry      Component Value Date/Time   NA 140 03/07/2017 0905   NA 143 12/06/2016 1156   NA 139 08/02/2016 1146   K 4.7 03/07/2017 0905   K 4.2 12/06/2016 1156   K 4.7 08/02/2016 1146   CL  106 03/07/2017 0905   CL 107 12/06/2016 1156   CO2 25 03/07/2017 0905   CO2 28 12/06/2016 1156   CO2 24 08/02/2016 1146   BUN 30 (H) 03/07/2017 0905   BUN 29 (H) 12/06/2016 1156   BUN 34.3 (H) 08/02/2016 1146   CREATININE 1.81 (H) 03/07/2017 0905   CREATININE 2.1 (H) 12/06/2016 1156   CREATININE 1.7 (H) 08/02/2016 1146      Component Value Date/Time   CALCIUM 9.2 03/07/2017 0905   CALCIUM 9.1 12/06/2016 1156   CALCIUM 9.5 08/02/2016 1146   ALKPHOS 114 03/07/2017 0905   ALKPHOS 108 (H) 12/06/2016 1156   ALKPHOS 115 08/02/2016 1146   AST 13 03/07/2017 0905   AST 14 08/02/2016 1146   ALT 9 03/07/2017 0905   ALT 18 12/06/2016 1156   ALT 9 08/02/2016 1146   BILITOT 0.3 03/07/2017 0905   BILITOT 0.41 08/02/2016 1146         Impression and Plan: Mr. Kaufhold is 77 year old man with IgA kappa smoldering myeloma.   Everything is doing well for him.  His numbers look good.  His myeloma levels look fantastic.  I will plan to see him back in 6 months now.  I just do not see that we need to treat the smoldering myeloma.  I still do not believe that his neuropathy is from myeloma.  I spent about 30 minutes with he and his wife.  Over 50% of the time was spent face-to-face reviewing all of his lab work.  [name], MD 8/26/201912:25 PM

## 2017-09-06 LAB — IGG, IGA, IGM
IgA: 1284 mg/dL — ABNORMAL HIGH (ref 61–437)
IgG (Immunoglobin G), Serum: 1122 mg/dL (ref 700–1600)
IgM (Immunoglobulin M), Srm: 68 mg/dL (ref 15–143)

## 2017-09-06 LAB — UPEP/UIFE/LIGHT CHAINS/TP, 24-HR UR
% BETA, URINE: 40 %
ALPHA 1 URINE: 3.4 %
Albumin, U: 18.4 %
Alpha 2, Urine: 4.9 %
FREE KAPPA LT CHAINS, UR: 232 mg/L — AB (ref 1.35–24.19)
Free Kappa/Lambda Ratio: 54.59 — ABNORMAL HIGH (ref 2.04–10.37)
Free Lambda Lt Chains,Ur: 4.25 mg/L (ref 0.24–6.66)
GAMMA GLOBULIN URINE: 33.3 %
M-SPIKE %, URINE: 24.2 % — AB
M-Spike, Mg/24 Hr: 26 mg/24 hr — ABNORMAL HIGH
TOTAL PROTEIN, URINE-UPE24: 5.2 mg/dL
TOTAL VOLUME: 20.5
Total Protein, Urine-Ur/day: 109 mg/24 hr (ref 30–150)

## 2017-09-06 LAB — KAPPA/LAMBDA LIGHT CHAINS
KAPPA FREE LGHT CHN: 107.7 mg/L — AB (ref 3.3–19.4)
Kappa, lambda light chain ratio: 4.29 — ABNORMAL HIGH (ref 0.26–1.65)
Lambda free light chains: 25.1 mg/L (ref 5.7–26.3)

## 2017-09-07 ENCOUNTER — Other Ambulatory Visit: Payer: Self-pay | Admitting: *Deleted

## 2017-09-07 ENCOUNTER — Inpatient Hospital Stay: Payer: Medicare Other

## 2017-09-07 ENCOUNTER — Telehealth: Payer: Self-pay | Admitting: *Deleted

## 2017-09-07 DIAGNOSIS — C9000 Multiple myeloma not having achieved remission: Secondary | ICD-10-CM

## 2017-09-07 DIAGNOSIS — Z79899 Other long term (current) drug therapy: Secondary | ICD-10-CM | POA: Diagnosis not present

## 2017-09-07 DIAGNOSIS — R413 Other amnesia: Secondary | ICD-10-CM | POA: Diagnosis not present

## 2017-09-07 DIAGNOSIS — D472 Monoclonal gammopathy: Secondary | ICD-10-CM

## 2017-09-07 LAB — CMP (CANCER CENTER ONLY)
ALBUMIN: 3.5 g/dL (ref 3.5–5.0)
ALK PHOS: 107 U/L — AB (ref 26–84)
ALT: 13 U/L (ref 10–47)
AST: 19 U/L (ref 11–38)
Anion gap: 5 (ref 5–15)
BILIRUBIN TOTAL: 0.6 mg/dL (ref 0.2–1.6)
BUN: 32 mg/dL — AB (ref 7–22)
CALCIUM: 9.3 mg/dL (ref 8.0–10.3)
CHLORIDE: 106 mmol/L (ref 98–108)
CO2: 30 mmol/L (ref 18–33)
CREATININE: 2.1 mg/dL — AB (ref 0.60–1.20)
Glucose, Bld: 124 mg/dL — ABNORMAL HIGH (ref 73–118)
Potassium: 5 mmol/L — ABNORMAL HIGH (ref 3.3–4.7)
SODIUM: 141 mmol/L (ref 128–145)
Total Protein: 7.6 g/dL (ref 6.4–8.1)

## 2017-09-07 NOTE — Telephone Encounter (Addendum)
Patient is aware of results  ----- Message from [name], MD sent at 09/07/2017  5:49 AM EDT ----- Call - the urine results are stable!!  pete

## 2017-09-08 LAB — IMMUNOFIXATION REFLEX, SERUM
IGM (IMMUNOGLOBULIN M), SRM: 80 mg/dL (ref 15–143)
IgA: 1387 mg/dL — ABNORMAL HIGH (ref 61–437)
IgG (Immunoglobin G), Serum: 1159 mg/dL (ref 700–1600)

## 2017-09-08 LAB — PROTEIN ELECTROPHORESIS, SERUM, WITH REFLEX
A/G RATIO SPE: 1 (ref 0.7–1.7)
ALBUMIN ELP: 3.7 g/dL (ref 2.9–4.4)
ALPHA-1-GLOBULIN: 0.2 g/dL (ref 0.0–0.4)
Alpha-2-Globulin: 0.7 g/dL (ref 0.4–1.0)
Beta Globulin: 1.7 g/dL — ABNORMAL HIGH (ref 0.7–1.3)
Gamma Globulin: 1.1 g/dL (ref 0.4–1.8)
Globulin, Total: 3.8 g/dL (ref 2.2–3.9)
M-Spike, %: 1 g/dL — ABNORMAL HIGH
SPEP INTERP: 0
TOTAL PROTEIN ELP: 7.5 g/dL (ref 6.0–8.5)

## 2017-09-16 ENCOUNTER — Telehealth: Payer: Self-pay | Admitting: Family Medicine

## 2017-09-16 ENCOUNTER — Other Ambulatory Visit: Payer: Self-pay | Admitting: *Deleted

## 2017-09-16 MED ORDER — LISINOPRIL-HYDROCHLOROTHIAZIDE 10-12.5 MG PO TABS: 1.0000 | ORAL_TABLET | Freq: Every day | ORAL | 0 refills | Status: DC

## 2017-09-16 NOTE — Telephone Encounter (Signed)
Copied from [place] [id number]. Topic: Quick Communication - Rx Refill/Question >> Sep 16, 2017 10:57 AM Gardiner Ramus wrote: Medication: lisinopril-hydrochlorothiazide (PRINZIDE,ZESTORETIC) 10-12.5 MG tablet [[id number]]   Has the patient contacted their pharmacy? no Preferred Pharmacy (with phone number or street name):[facility] [place], [place] to Registered [facility] [phone] (Phone) [phone] (Fax)   Agent: Please be advised that RX refills may take up to 3 business days. We ask that you follow-up with your pharmacy.

## 2017-09-16 NOTE — Telephone Encounter (Signed)
Rx refill request granted for #90 no additional. Patient has CPE scheduled 10/17/17.

## 2017-10-10 ENCOUNTER — Encounter: Payer: Self-pay | Admitting: Family Medicine

## 2017-10-10 ENCOUNTER — Ambulatory Visit (INDEPENDENT_AMBULATORY_CARE_PROVIDER_SITE_OTHER): Payer: Medicare Other | Admitting: Family Medicine

## 2017-10-10 VITALS — BP 110/62 | HR 65 | Temp 98.4°F | Wt 178.7 lb

## 2017-10-10 DIAGNOSIS — R300 Dysuria: Secondary | ICD-10-CM | POA: Diagnosis not present

## 2017-10-10 DIAGNOSIS — K5792 Diverticulitis of intestine, part unspecified, without perforation or abscess without bleeding: Secondary | ICD-10-CM

## 2017-10-10 MED ORDER — CIPROFLOXACIN HCL 500 MG PO TABS: 500.0000 mg | ORAL_TABLET | Freq: Two times a day (BID) | ORAL | 0 refills | Status: DC

## 2017-10-10 NOTE — Patient Instructions (Addendum)
Begin Cipro 500 mg.............Marland Kitchen 1 twice daily  Clear liquid diet  Take milk of mag  and/or prune juice to clean your colon out.  Return to see Dr. Elease Hashimoto on Wednesday for follow-up  Rest at home

## 2017-10-10 NOTE — Progress Notes (Signed)
James Marks is a 77 year old male patient of Dr. B.  Who comes in today for evaluation of abdominal pain  He has a history of diverticulitis.  He says his last flareup was for 5 years ago but I cannot find documentation.  He states it started bothering 2 days ago with some right left lower quadrant abdominal pain.  He said no fever chills nausea vomiting diarrhea.  He states he has been treated in the past with medicine and always gets better quickly but he does not recall what he took  BP 110/62 (BP Location: Right Arm, Patient Position: Sitting, Cuff Size: Normal)   Pulse 65   Temp 98.4 F (36.9 C) (Oral)   Wt 178 lb 11.2 oz (81.1 kg)   SpO2 99%   BMI 22.94 kg/m  Well-developed well-nourished male no acute distress vital signs stable he is afebrile examination of the abdomen in the supine position the.  The abdomen is flat bowel sounds are normal there is some tenderness left lower quadrant but no rebound.  1.  Diverticulitis........ clear liquids........Marland Kitchen begin Cipro....... follow-up by Dr. Elease Hashimoto in 48 hours

## 2017-10-17 ENCOUNTER — Encounter: Payer: Medicare Other | Admitting: Family Medicine

## 2017-10-24 DIAGNOSIS — L821 Other seborrheic keratosis: Secondary | ICD-10-CM | POA: Diagnosis not present

## 2017-10-24 DIAGNOSIS — D485 Neoplasm of uncertain behavior of skin: Secondary | ICD-10-CM | POA: Diagnosis not present

## 2017-10-24 DIAGNOSIS — L820 Inflamed seborrheic keratosis: Secondary | ICD-10-CM | POA: Diagnosis not present

## 2017-10-31 ENCOUNTER — Encounter: Payer: Self-pay | Admitting: Family Medicine

## 2017-10-31 ENCOUNTER — Ambulatory Visit (INDEPENDENT_AMBULATORY_CARE_PROVIDER_SITE_OTHER): Payer: Medicare Other | Admitting: Family Medicine

## 2017-10-31 ENCOUNTER — Other Ambulatory Visit: Payer: Self-pay

## 2017-10-31 VITALS — BP 130/74 | HR 66 | Temp 97.8°F | Ht 73.5 in | Wt 180.8 lb

## 2017-10-31 DIAGNOSIS — Z23 Encounter for immunization: Secondary | ICD-10-CM

## 2017-10-31 DIAGNOSIS — I10 Essential (primary) hypertension: Secondary | ICD-10-CM

## 2017-10-31 DIAGNOSIS — D472 Monoclonal gammopathy: Secondary | ICD-10-CM

## 2017-10-31 DIAGNOSIS — N183 Chronic kidney disease, stage 3 unspecified: Secondary | ICD-10-CM

## 2017-10-31 DIAGNOSIS — E785 Hyperlipidemia, unspecified: Secondary | ICD-10-CM

## 2017-10-31 DIAGNOSIS — C9000 Multiple myeloma not having achieved remission: Secondary | ICD-10-CM

## 2017-10-31 NOTE — Patient Instructions (Signed)
Consider shingles vaccine (Shingrix) and check with pharmacy if interested.   

## 2017-10-31 NOTE — Progress Notes (Signed)
Subjective:     Patient ID: James Marks, male   DOB: 07/31/1940, 77 y.o.   MRN: [id number]  HPI Patient seen for medical follow-up.  He has history of multiple myeloma, chronic kidney disease stage III, hyperlipidemia, hypertension, history of recurrent diverticulitis.  He had diverticulitis flare recently and symptoms fully resolved after course of Cipro.  He takes lisinopril HCTZ for hypertension.  Blood pressures been well controlled.  No recent dizziness or headaches.  No chest pains.  He has peripheral neuropathy with numbness and pain and especially at night.  Takes gabapentin per oncology.  Still has breakthrough symptoms.  He is reluctant to take additional medications at this time.  No history of diabetes.  Needs flu vaccine.  Pneumonia vaccines up-to-date.  No history of shingles vaccine.  Past Medical History:  Diagnosis Date  . Cancer ([place])    myeloma  . Diverticulitis   . Hearing loss   . Hx of colonic polyps   . Hyperlipidemia   . Hypertension   . Kidney stones   . Osteopenia   . Smoldering multiple myeloma ([place]) 08/26/2014  . Subarachnoid hemorrhage ([place]) 2/01   nontraumatic   Past Surgical History:  Procedure Laterality Date  . None      reports that he quit smoking about 5 years ago. His smoking use included cigarettes. He started smoking about 65 years ago. He has a 44.00 pack-year smoking history. He quit smokeless tobacco use about 5 years ago. He reports that he drinks alcohol. He reports that he does not use drugs. family history includes Cancer in his mother; Diabetes in his mother and unknown relative; Diabetes Mellitus II in his brother; Hypertension in his father; Macular degeneration in his brother. No Known Allergies   Review of Systems  Constitutional: Positive for fatigue. Negative for appetite change and unexpected weight change.  Eyes: Negative for visual disturbance.  Respiratory: Negative for cough, chest tightness and shortness of breath.    Cardiovascular: Negative for chest pain, palpitations and leg swelling.  Gastrointestinal: Negative for abdominal pain.  Genitourinary: Negative for dysuria.  Neurological: Negative for dizziness, syncope, weakness, light-headedness and headaches.       Objective:   Physical Exam  Constitutional: He is oriented to person, place, and time. He appears well-developed and well-nourished.  HENT:  Right Ear: External ear normal.  Left Ear: External ear normal.  Mouth/Throat: Oropharynx is clear and moist.  Eyes: Pupils are equal, round, and reactive to light.  Neck: Neck supple. No thyromegaly present.  Cardiovascular: Normal rate and regular rhythm.  Pulmonary/Chest: Effort normal and breath sounds normal. No respiratory distress. He has no wheezes. He has no rales.  Musculoskeletal: He exhibits no edema.  Neurological: He is alert and oriented to person, place, and time.       Assessment:     #1 hypertension stable and at goal  #2 chronic kidney disease stage III.  He is getting regular labs through oncology  #3 multiple myeloma followed by oncology  #4 peripheral neuropathy related to #3.  Still has breakthrough pain in spite of gabapentin    Plan:     -Flu vaccine given -We discussed other potential medications for neuropathy to augment the gabapentin such as Cymbalta but at this point he wishes to observe -Continue lisinopril HCTZ.  We elected not to get any labs since he gets these regularly through heme-onc -Discussed shingles vaccine and he will check at pharmacy for coverage -Routine follow-up in 6 months and sooner  as needed  Eulas Post MD [facility]

## 2017-12-25 ENCOUNTER — Other Ambulatory Visit: Payer: Self-pay | Admitting: Family Medicine

## 2018-03-03 ENCOUNTER — Inpatient Hospital Stay: Payer: Medicare Other | Attending: Hematology & Oncology

## 2018-03-03 DIAGNOSIS — C9000 Multiple myeloma not having achieved remission: Secondary | ICD-10-CM | POA: Insufficient documentation

## 2018-03-03 DIAGNOSIS — Z79899 Other long term (current) drug therapy: Secondary | ICD-10-CM | POA: Insufficient documentation

## 2018-03-08 ENCOUNTER — Inpatient Hospital Stay (HOSPITAL_BASED_OUTPATIENT_CLINIC_OR_DEPARTMENT_OTHER): Payer: Medicare Other | Admitting: Hematology & Oncology

## 2018-03-08 ENCOUNTER — Other Ambulatory Visit: Payer: Self-pay

## 2018-03-08 ENCOUNTER — Inpatient Hospital Stay: Payer: Medicare Other

## 2018-03-08 VITALS — BP 117/62 | HR 54 | Temp 97.7°F | Resp 20 | Wt 179.5 lb

## 2018-03-08 DIAGNOSIS — Z79899 Other long term (current) drug therapy: Secondary | ICD-10-CM | POA: Diagnosis not present

## 2018-03-08 DIAGNOSIS — D472 Monoclonal gammopathy: Secondary | ICD-10-CM

## 2018-03-08 DIAGNOSIS — C9000 Multiple myeloma not having achieved remission: Secondary | ICD-10-CM

## 2018-03-08 LAB — CMP (CANCER CENTER ONLY)
ALT: 6 U/L (ref 0–44)
ANION GAP: 8 (ref 5–15)
AST: 10 U/L — ABNORMAL LOW (ref 15–41)
Albumin: 4.1 g/dL (ref 3.5–5.0)
Alkaline Phosphatase: 108 U/L (ref 38–126)
BUN: 29 mg/dL — ABNORMAL HIGH (ref 8–23)
CALCIUM: 9 mg/dL (ref 8.9–10.3)
CO2: 27 mmol/L (ref 22–32)
Chloride: 104 mmol/L (ref 98–111)
Creatinine: 1.97 mg/dL — ABNORMAL HIGH (ref 0.61–1.24)
GFR, EST AFRICAN AMERICAN: 36 mL/min — AB (ref >60)
GFR, EST NON AFRICAN AMERICAN: 31 mL/min — AB (ref >60)
Glucose, Bld: 109 mg/dL — ABNORMAL HIGH (ref 70–99)
Potassium: 4.9 mmol/L (ref 3.5–5.1)
SODIUM: 139 mmol/L (ref 135–145)
TOTAL PROTEIN: 7.7 g/dL (ref 6.5–8.1)
Total Bilirubin: 0.4 mg/dL (ref 0.3–1.2)

## 2018-03-08 LAB — CBC WITH DIFFERENTIAL (CANCER CENTER ONLY)
ABS IMMATURE GRANULOCYTES: 0.03 10*3/uL (ref 0.00–0.07)
BASOS ABS: 0.1 10*3/uL (ref 0.0–0.1)
BASOS PCT: 1 %
Eosinophils Absolute: 0.2 10*3/uL (ref 0.0–0.5)
Eosinophils Relative: 3 %
HCT: 39.6 % (ref 39.0–52.0)
Hemoglobin: 12.4 g/dL — ABNORMAL LOW (ref 13.0–17.0)
Immature Granulocytes: 0 %
Lymphocytes Relative: 20 %
Lymphs Abs: 1.5 10*3/uL (ref 0.7–4.0)
MCH: 28 pg (ref 26.0–34.0)
MCHC: 31.3 g/dL (ref 30.0–36.0)
MCV: 89.4 fL (ref 80.0–100.0)
MONO ABS: 0.5 10*3/uL (ref 0.1–1.0)
Monocytes Relative: 7 %
NEUTROS ABS: 5 10*3/uL (ref 1.7–7.7)
NEUTROS PCT: 69 %
NRBC: 0 % (ref 0.0–0.2)
PLATELETS: 222 10*3/uL (ref 150–400)
RBC: 4.43 MIL/uL (ref 4.22–5.81)
RDW: 12.7 % (ref 11.5–15.5)
WBC: 7.4 10*3/uL (ref 4.0–10.5)

## 2018-03-08 NOTE — Progress Notes (Signed)
Hematology and Oncology Follow Up Visit  James Marks [id number] [birth year] 78 y.o. 03/08/2018   Principle Diagnosis:   IgA kappa smoldering myeloma-normal chromosomes  Current Therapy:    Observation     Interim History:  James Marks is back for follow-up.  He is doing okay.  For right now, everything has been holding steady for him.  His golf game is not doing too well.  He is not too happy about that.  His myeloma studies were a little bit concerning.  More last saw him, his M spike was up to 1 g/dL.  His IgA level was 1387 mg/dL.  His kappa light chain was 10.8 mg/dL.  It seems like his memory issues might be a little bit better.  He has had no cough or shortness of breath.  He has had no change in bowel or bladder habits.  He has had no rashes.  He does have some seborrheic keratoses.  He has had no fever.  There is been no leg swelling.  Overall, his performance status is ECOG 2.   Medications:  Current Outpatient Medications:  .  gabapentin (NEURONTIN) 300 MG capsule, Take 1 capsule with breakfast and dinner, then 2 at bedtime., Disp: 120 capsule, Rfl: 9 .  lisinopril-hydrochlorothiazide (PRINZIDE,ZESTORETIC) 10-12.5 MG tablet, TAKE 1 TABLET DAILY, Disp: 90 tablet, Rfl: 0 No current facility-administered medications for this visit.   Facility-Administered Medications Ordered in Other Visits:  .  0.9 %  sodium chloride infusion, , Intravenous, Continuous, Ennever, Rudell Cobb, MD, Stopped at 08/05/14 1710  Allergies: No Known Allergies  Past Medical History, Surgical history, Social history, and Family History were reviewed and updated.  Review of Systems: Review of Systems  Constitutional: Positive for malaise/fatigue.  HENT: Positive for congestion.   Eyes: Negative.   Respiratory: Positive for shortness of breath.   Cardiovascular: Positive for palpitations.  Gastrointestinal: Negative.   Genitourinary: Negative.   Musculoskeletal: Positive for back pain and  myalgias.  Skin: Negative.   Neurological: Positive for tingling.  Endo/Heme/Allergies: Negative.   Psychiatric/Behavioral: Negative.     Physical Exam:  weight is 179 lb 8 oz (81.4 kg). His oral temperature is 97.7 F (36.5 C). His blood pressure is 117/62 and his pulse is 54 (abnormal). His respiration is 20 and oxygen saturation is 100%.   Wt Readings from Last 3 Encounters:  03/08/18 179 lb 8 oz (81.4 kg)  10/31/17 180 lb 12.8 oz (82 kg)  10/10/17 178 lb 11.2 oz (81.1 kg)     Physical Exam Vitals signs reviewed.  HENT:     Head: Normocephalic and atraumatic.  Eyes:     Pupils: Pupils are equal, round, and reactive to light.  Neck:     Musculoskeletal: Normal range of motion.  Cardiovascular:     Rate and Rhythm: Normal rate and regular rhythm.     Heart sounds: Normal heart sounds.  Pulmonary:     Effort: Pulmonary effort is normal.     Breath sounds: Normal breath sounds.  Abdominal:     General: Bowel sounds are normal.     Palpations: Abdomen is soft.  Musculoskeletal: Normal range of motion.        General: No tenderness or deformity.  Lymphadenopathy:     Cervical: No cervical adenopathy.  Skin:    General: Skin is warm and dry.     Findings: No erythema or rash.  Neurological:     Mental Status: He is alert and oriented to person, place,  and time.  Psychiatric:        Behavior: Behavior normal.        Thought Content: Thought content normal.        Judgment: Judgment normal.      Lab Results  Component Value Date   WBC 7.4 03/08/2018   HGB 12.4 (L) 03/08/2018   HCT 39.6 03/08/2018   MCV 89.4 03/08/2018   PLT 222 03/08/2018     Chemistry      Component Value Date/Time   NA 139 03/08/2018 1001   NA 143 12/06/2016 1156   NA 139 08/02/2016 1146   K 4.9 03/08/2018 1001   K 4.2 12/06/2016 1156   K 4.7 08/02/2016 1146   CL 104 03/08/2018 1001   CL 107 12/06/2016 1156   CO2 27 03/08/2018 1001   CO2 28 12/06/2016 1156   CO2 24 08/02/2016 1146     BUN 29 (H) 03/08/2018 1001   BUN 29 (H) 12/06/2016 1156   BUN 34.3 (H) 08/02/2016 1146   CREATININE 1.97 (H) 03/08/2018 1001   CREATININE 2.1 (H) 12/06/2016 1156   CREATININE 1.7 (H) 08/02/2016 1146      Component Value Date/Time   CALCIUM 9.0 03/08/2018 1001   CALCIUM 9.1 12/06/2016 1156   CALCIUM 9.5 08/02/2016 1146   ALKPHOS 108 03/08/2018 1001   ALKPHOS 108 (H) 12/06/2016 1156   ALKPHOS 115 08/02/2016 1146   AST 10 (L) 03/08/2018 1001   AST 14 08/02/2016 1146   ALT 6 03/08/2018 1001   ALT 18 12/06/2016 1156   ALT 9 08/02/2016 1146   BILITOT 0.4 03/08/2018 1001   BILITOT 0.41 08/02/2016 1146         Impression and Plan: James Marks is 78 year old man with IgA kappa smoldering myeloma.   We will have to see what his M spike is this time.  If it is over 1.5 g/dL, then I think we may have to consider treatment on him.  I would be reluctant to do this.  I would probably use some type of modified therapy.  If all looks stable, then I will get him back in 6 months.    [name], MD 2/26/202011:36 AM

## 2018-03-09 LAB — UPEP/UIFE/LIGHT CHAINS/TP, 24-HR UR
% BETA, URINE: 46.8 %
ALPHA 1 URINE: 5.6 %
Albumin, U: 25.9 %
Alpha 2, Urine: 5.2 %
FREE KAPPA/LAMBDA RATIO: 45.37 — AB (ref 1.03–31.76)
FREE LAMBDA LT CHAINS, UR: 3.42 mg/L (ref 0.47–11.77)
Free Kappa Lt Chains,Ur: 155.17 mg/L — ABNORMAL HIGH (ref 0.63–113.79)
GAMMA GLOBULIN URINE: 16.5 %
M-SPIKE %, URINE: 30.3 % — AB
M-Spike, Mg/24 Hr: 29 mg/24 hr — ABNORMAL HIGH
TOTAL PROTEIN, URINE-UPE24: 5.1 mg/dL
TOTAL VOLUME: 1900
Total Protein, Urine-Ur/day: 97 mg/24 hr (ref 30–150)

## 2018-03-09 LAB — KAPPA/LAMBDA LIGHT CHAINS
Kappa free light chain: 94.1 mg/L — ABNORMAL HIGH (ref 3.3–19.4)
Kappa, lambda light chain ratio: 3.69 — ABNORMAL HIGH (ref 0.26–1.65)
Lambda free light chains: 25.5 mg/L (ref 5.7–26.3)

## 2018-03-09 LAB — IGG, IGA, IGM
IgA: 1296 mg/dL — ABNORMAL HIGH (ref 61–437)
IgG (Immunoglobin G), Serum: 1050 mg/dL (ref 700–1600)
IgM (Immunoglobulin M), Srm: 73 mg/dL (ref 15–143)

## 2018-03-10 ENCOUNTER — Telehealth: Payer: Self-pay | Admitting: *Deleted

## 2018-03-10 NOTE — Telephone Encounter (Addendum)
Patient's wife is aware of results  ----- Message from [name], MD sent at 03/09/2018  5:30 PM EST ----- Call - the urine shows less myeloma protein!  The urine had 155 mg of protein this time.  The last time it had 232 mg!!  This is very encouraging!!  Laurey Arrow

## 2018-03-13 LAB — PROTEIN ELECTROPHORESIS, SERUM, WITH REFLEX
A/G RATIO SPE: 1 (ref 0.7–1.7)
ALBUMIN ELP: 3.6 g/dL (ref 2.9–4.4)
Alpha-1-Globulin: 0.2 g/dL (ref 0.0–0.4)
Alpha-2-Globulin: 0.7 g/dL (ref 0.4–1.0)
Beta Globulin: 1.6 g/dL — ABNORMAL HIGH (ref 0.7–1.3)
GAMMA GLOBULIN: 1 g/dL (ref 0.4–1.8)
GLOBULIN, TOTAL: 3.5 g/dL (ref 2.2–3.9)
M-Spike, %: 1 g/dL — ABNORMAL HIGH
SPEP INTERP: 0
TOTAL PROTEIN ELP: 7.1 g/dL (ref 6.0–8.5)

## 2018-03-13 LAB — IMMUNOFIXATION REFLEX, SERUM
IgA: 1274 mg/dL — ABNORMAL HIGH (ref 61–437)
IgG (Immunoglobin G), Serum: 1051 mg/dL (ref 700–1600)
IgM (Immunoglobulin M), Srm: 66 mg/dL (ref 15–143)

## 2018-03-14 ENCOUNTER — Encounter: Payer: Self-pay | Admitting: *Deleted

## 2018-03-22 ENCOUNTER — Other Ambulatory Visit: Payer: Self-pay | Admitting: Hematology & Oncology

## 2018-04-17 ENCOUNTER — Other Ambulatory Visit: Payer: Self-pay | Admitting: Family Medicine

## 2018-04-23 ENCOUNTER — Other Ambulatory Visit: Payer: Self-pay | Admitting: Hematology & Oncology

## 2018-05-02 ENCOUNTER — Ambulatory Visit (INDEPENDENT_AMBULATORY_CARE_PROVIDER_SITE_OTHER): Payer: Medicare Other | Admitting: Family Medicine

## 2018-05-02 ENCOUNTER — Ambulatory Visit: Payer: Medicare Other | Admitting: Family Medicine

## 2018-05-02 ENCOUNTER — Other Ambulatory Visit: Payer: Self-pay

## 2018-05-02 VITALS — BP 126/61

## 2018-05-02 DIAGNOSIS — I10 Essential (primary) hypertension: Secondary | ICD-10-CM | POA: Diagnosis not present

## 2018-05-02 MED ORDER — LISINOPRIL-HYDROCHLOROTHIAZIDE 10-12.5 MG PO TABS: 1.0000 | ORAL_TABLET | Freq: Every day | ORAL | 3 refills | Status: DC

## 2018-05-02 NOTE — Progress Notes (Signed)
Patient ID: James Marks, male   DOB: Dec 10, 1940, 78 y.o.   MRN: [id number]  Virtual Visit via Video Note  I connected with Tor Netters on 05/02/18 at 10:45 AM EDT by a video enabled telemedicine application and verified that I am speaking with the correct person using two identifiers.  Location patient: home Location provider:work or home office Persons participating in the virtual visit: patient, provider  I discussed the limitations of evaluation and management by telemedicine and the availability of in person appointments. The patient expressed understanding and agreed to proceed.   HPI:   Follow-up hypertension.  Patient takes lisinopril HCTZ.  He has history of multiple myeloma and is followed closely by oncology.  He has been stable from that standpoint.  Generally feels well.  He has not been golfing as much as usual because of recent issues with pandemic but is walking a lot in the neighborhood.  No chest pains.  No dizziness.  Blood pressure this morning 126/61 with pulse of 73.  He had recent electrolytes which were stable.   ROS: See pertinent positives and negatives per HPI.  Past Medical History:  Diagnosis Date  . Cancer ([place])    myeloma  . Diverticulitis   . Hearing loss   . Hx of colonic polyps   . Hyperlipidemia   . Hypertension   . Kidney stones   . Osteopenia   . Smoldering multiple myeloma ([place]) 08/26/2014  . Subarachnoid hemorrhage ([place]) 2/01   nontraumatic    Past Surgical History:  Procedure Laterality Date  . None      Family History  Problem Relation Age of Onset  . Cancer Mother        breast  . Diabetes Mother   . Hypertension Father   . Diabetes Unknown        parent  . Macular degeneration Brother   . Diabetes Mellitus II Brother     SOCIAL HX: Smoking 2014.  He is married.  Enjoys golfing   Current Outpatient Medications:  .  gabapentin (NEURONTIN) 300 MG capsule, TAKE 1 CAPSULE WITH BREAKFAST AND DINNER, THEN 2 AT BEDTIME.,  Disp: 360 capsule, Rfl: 4 .  lisinopril-hydrochlorothiazide (PRINZIDE,ZESTORETIC) 10-12.5 MG tablet, TAKE 1 TABLET DAILY, Disp: 90 tablet, Rfl: 0 No current facility-administered medications for this visit.   Facility-Administered Medications Ordered in Other Visits:  .  0.9 %  sodium chloride infusion, , Intravenous, Continuous, Ennever, Rudell Cobb, MD, Stopped at 08/05/14 1710  EXAM:  VITALS per patient if applicable:  GENERAL: alert, oriented, appears well and in no acute distress  HEENT: atraumatic, conjunttiva clear, no obvious abnormalities on inspection of external nose and ears  NECK: normal movements of the head and neck  LUNGS: on inspection no signs of respiratory distress, breathing rate appears normal, no obvious gross SOB, gasping or wheezing  CV: no obvious cyanosis  MS: moves all visible extremities without noticeable abnormality  PSYCH/NEURO: pleasant and cooperative, no obvious depression or anxiety, speech and thought processing grossly intact  ASSESSMENT AND PLAN:  Discussed the following assessment and plan:  Essential hypertension-stable by home readings -Refill lisinopril HCTZ for 1 year -Hopefully can schedule office follow-up in about 6 months     I discussed the assessment and treatment plan with the patient. The patient was provided an opportunity to ask questions and all were answered. The patient agreed with the plan and demonstrated an understanding of the instructions.   The patient was advised to call back or seek  an in-person evaluation if the symptoms worsen or if the condition fails to improve as anticipated.   Carolann Littler, MD

## 2018-07-25 DIAGNOSIS — H5203 Hypermetropia, bilateral: Secondary | ICD-10-CM | POA: Diagnosis not present

## 2018-07-25 DIAGNOSIS — H52223 Regular astigmatism, bilateral: Secondary | ICD-10-CM | POA: Diagnosis not present

## 2018-07-25 DIAGNOSIS — H2513 Age-related nuclear cataract, bilateral: Secondary | ICD-10-CM | POA: Diagnosis not present

## 2018-07-25 DIAGNOSIS — H524 Presbyopia: Secondary | ICD-10-CM | POA: Diagnosis not present

## 2018-09-01 ENCOUNTER — Inpatient Hospital Stay: Payer: Medicare Other | Attending: Hematology & Oncology

## 2018-09-01 DIAGNOSIS — C9000 Multiple myeloma not having achieved remission: Secondary | ICD-10-CM | POA: Insufficient documentation

## 2018-09-01 DIAGNOSIS — Z79899 Other long term (current) drug therapy: Secondary | ICD-10-CM | POA: Insufficient documentation

## 2018-09-06 ENCOUNTER — Inpatient Hospital Stay (HOSPITAL_BASED_OUTPATIENT_CLINIC_OR_DEPARTMENT_OTHER): Payer: Medicare Other | Admitting: Hematology & Oncology

## 2018-09-06 ENCOUNTER — Telehealth: Payer: Self-pay | Admitting: Hematology & Oncology

## 2018-09-06 ENCOUNTER — Encounter: Payer: Self-pay | Admitting: Hematology & Oncology

## 2018-09-06 ENCOUNTER — Inpatient Hospital Stay: Payer: Medicare Other

## 2018-09-06 ENCOUNTER — Other Ambulatory Visit: Payer: Self-pay

## 2018-09-06 VITALS — BP 120/56 | HR 50 | Temp 97.6°F | Resp 20 | Wt 173.0 lb

## 2018-09-06 DIAGNOSIS — C9000 Multiple myeloma not having achieved remission: Secondary | ICD-10-CM | POA: Diagnosis not present

## 2018-09-06 DIAGNOSIS — D472 Monoclonal gammopathy: Secondary | ICD-10-CM

## 2018-09-06 DIAGNOSIS — Z79899 Other long term (current) drug therapy: Secondary | ICD-10-CM | POA: Diagnosis not present

## 2018-09-06 LAB — CBC WITH DIFFERENTIAL (CANCER CENTER ONLY)
Abs Immature Granulocytes: 0.03 10*3/uL (ref 0.00–0.07)
Basophils Absolute: 0.1 10*3/uL (ref 0.0–0.1)
Basophils Relative: 1 %
Eosinophils Absolute: 0.2 10*3/uL (ref 0.0–0.5)
Eosinophils Relative: 3 %
HCT: 37.3 % — ABNORMAL LOW (ref 39.0–52.0)
Hemoglobin: 11.6 g/dL — ABNORMAL LOW (ref 13.0–17.0)
Immature Granulocytes: 0 %
Lymphocytes Relative: 25 %
Lymphs Abs: 1.9 10*3/uL (ref 0.7–4.0)
MCH: 28.4 pg (ref 26.0–34.0)
MCHC: 31.1 g/dL (ref 30.0–36.0)
MCV: 91.4 fL (ref 80.0–100.0)
Monocytes Absolute: 0.5 10*3/uL (ref 0.1–1.0)
Monocytes Relative: 6 %
Neutro Abs: 4.9 10*3/uL (ref 1.7–7.7)
Neutrophils Relative %: 65 %
Platelet Count: 236 10*3/uL (ref 150–400)
RBC: 4.08 MIL/uL — ABNORMAL LOW (ref 4.22–5.81)
RDW: 13.1 % (ref 11.5–15.5)
WBC Count: 7.6 10*3/uL (ref 4.0–10.5)
nRBC: 0 % (ref 0.0–0.2)

## 2018-09-06 LAB — CMP (CANCER CENTER ONLY)
ALT: 7 U/L (ref 0–44)
AST: 10 U/L — ABNORMAL LOW (ref 15–41)
Albumin: 3.8 g/dL (ref 3.5–5.0)
Alkaline Phosphatase: 101 U/L (ref 38–126)
Anion gap: 6 (ref 5–15)
BUN: 31 mg/dL — ABNORMAL HIGH (ref 8–23)
CO2: 28 mmol/L (ref 22–32)
Calcium: 8.5 mg/dL — ABNORMAL LOW (ref 8.9–10.3)
Chloride: 107 mmol/L (ref 98–111)
Creatinine: 1.89 mg/dL — ABNORMAL HIGH (ref 0.61–1.24)
GFR, Est AFR Am: 38 mL/min — ABNORMAL LOW (ref >60)
GFR, Estimated: 33 mL/min — ABNORMAL LOW (ref >60)
Glucose, Bld: 111 mg/dL — ABNORMAL HIGH (ref 70–99)
Potassium: 5.3 mmol/L — ABNORMAL HIGH (ref 3.5–5.1)
Sodium: 141 mmol/L (ref 135–145)
Total Bilirubin: 0.3 mg/dL (ref 0.3–1.2)
Total Protein: 7.3 g/dL (ref 6.5–8.1)

## 2018-09-06 NOTE — Telephone Encounter (Signed)
lmom to inform patient of Feb 2021 appts per 8/26 LOS

## 2018-09-06 NOTE — Progress Notes (Signed)
Hematology and Oncology Follow Up Visit  MILIK FABREGAS [id number] [birth year] 78 y.o. 09/06/2018   Principle Diagnosis:   IgA kappa smoldering myeloma-normal chromosomes  Current Therapy:    Observation     Interim History:  Mr. Finkenbinder is back for follow-up.  He is doing okay.  He and his wife are being very cautious with the coronavirus.  He has not played golf in 6 months.  He has been doing a lot of yard work.  More last saw him, everything looked steady with his myeloma levels.  His M spike was 1.0 g/dL.  His IgA level was 1300 mg/dL.  He has had no problems with appetite.  He has had no nausea or vomiting.  There is been no cough.  He has had no shortness of breath.  He has had no leg swelling.  Overall, I would say performed status is ECOG 1.    Medications:  Current Outpatient Medications:  .  gabapentin (NEURONTIN) 300 MG capsule, TAKE 1 CAPSULE WITH BREAKFAST AND DINNER, THEN 2 AT BEDTIME., Disp: 360 capsule, Rfl: 4 .  lisinopril-hydrochlorothiazide (ZESTORETIC) 10-12.5 MG tablet, Take 1 tablet by mouth daily., Disp: 90 tablet, Rfl: 3 No current facility-administered medications for this visit.   Facility-Administered Medications Ordered in Other Visits:  .  0.9 %  sodium chloride infusion, , Intravenous, Continuous, Jaicion Laurie, Rudell Cobb, MD, Stopped at 08/05/14 1710  Allergies: No Known Allergies  Past Medical History, Surgical history, Social history, and Family History were reviewed and updated.  Review of Systems: Review of Systems  Constitutional: Positive for malaise/fatigue.  HENT: Positive for congestion.   Eyes: Negative.   Respiratory: Positive for shortness of breath.   Cardiovascular: Positive for palpitations.  Gastrointestinal: Negative.   Genitourinary: Negative.   Musculoskeletal: Positive for back pain and myalgias.  Skin: Negative.   Neurological: Positive for tingling.  Endo/Heme/Allergies: Negative.   Psychiatric/Behavioral: Negative.      Physical Exam:  weight is 173 lb (78.5 kg). His oral temperature is 97.6 F (36.4 C). His blood pressure is 120/56 (abnormal) and his pulse is 50 (abnormal). His respiration is 20 and oxygen saturation is 100%.   Wt Readings from Last 3 Encounters:  09/06/18 173 lb (78.5 kg)  03/08/18 179 lb 8 oz (81.4 kg)  10/31/17 180 lb 12.8 oz (82 kg)     Physical Exam Vitals signs reviewed.  HENT:     Head: Normocephalic and atraumatic.  Eyes:     Pupils: Pupils are equal, round, and reactive to light.  Neck:     Musculoskeletal: Normal range of motion.  Cardiovascular:     Rate and Rhythm: Normal rate and regular rhythm.     Heart sounds: Normal heart sounds.  Pulmonary:     Effort: Pulmonary effort is normal.     Breath sounds: Normal breath sounds.  Abdominal:     General: Bowel sounds are normal.     Palpations: Abdomen is soft.  Musculoskeletal: Normal range of motion.        General: No tenderness or deformity.  Lymphadenopathy:     Cervical: No cervical adenopathy.  Skin:    General: Skin is warm and dry.     Findings: No erythema or rash.  Neurological:     Mental Status: He is alert and oriented to person, place, and time.  Psychiatric:        Behavior: Behavior normal.        Thought Content: Thought content normal.  Judgment: Judgment normal.      Lab Results  Component Value Date   WBC 7.6 09/06/2018   HGB 11.6 (L) 09/06/2018   HCT 37.3 (L) 09/06/2018   MCV 91.4 09/06/2018   PLT 236 09/06/2018     Chemistry      Component Value Date/Time   NA 141 09/06/2018 1014   NA 143 12/06/2016 1156   NA 139 08/02/2016 1146   K 5.3 (H) 09/06/2018 1014   K 4.2 12/06/2016 1156   K 4.7 08/02/2016 1146   CL 107 09/06/2018 1014   CL 107 12/06/2016 1156   CO2 28 09/06/2018 1014   CO2 28 12/06/2016 1156   CO2 24 08/02/2016 1146   BUN 31 (H) 09/06/2018 1014   BUN 29 (H) 12/06/2016 1156   BUN 34.3 (H) 08/02/2016 1146   CREATININE 1.89 (H) 09/06/2018 1014    CREATININE 2.1 (H) 12/06/2016 1156   CREATININE 1.7 (H) 08/02/2016 1146      Component Value Date/Time   CALCIUM 8.5 (L) 09/06/2018 1014   CALCIUM 9.1 12/06/2016 1156   CALCIUM 9.5 08/02/2016 1146   ALKPHOS 101 09/06/2018 1014   ALKPHOS 108 (H) 12/06/2016 1156   ALKPHOS 115 08/02/2016 1146   AST 10 (L) 09/06/2018 1014   AST 14 08/02/2016 1146   ALT 7 09/06/2018 1014   ALT 18 12/06/2016 1156   ALT 9 08/02/2016 1146   BILITOT 0.3 09/06/2018 1014   BILITOT 0.41 08/02/2016 1146         Impression and Plan: Mr. Cornwell is 78 year old man with IgA kappa smoldering myeloma.   We will have to see what his M spike is this time.  So far, we be very conservative with respect to having to treat him.  He really has had no symptoms.  I will plan for another follow-up in 6 more months.      [name], MD 8/26/202011:17 AM

## 2018-09-07 LAB — KAPPA/LAMBDA LIGHT CHAINS
Kappa free light chain: 108.1 mg/L — ABNORMAL HIGH (ref 3.3–19.4)
Kappa, lambda light chain ratio: 3.58 — ABNORMAL HIGH (ref 0.26–1.65)
Lambda free light chains: 30.2 mg/L — ABNORMAL HIGH (ref 5.7–26.3)

## 2018-09-07 LAB — UPEP/UIFE/LIGHT CHAINS/TP, 24-HR UR
% BETA, Urine: 0 %
ALPHA 1 URINE: 0 %
Albumin, U: 100 %
Alpha 2, Urine: 0 %
Free Kappa Lt Chains,Ur: 103.1 mg/L (ref 0.63–113.79)
Free Kappa/Lambda Ratio: 51.04 — ABNORMAL HIGH (ref 1.03–31.76)
Free Lambda Lt Chains,Ur: 2.02 mg/L (ref 0.47–11.77)
GAMMA GLOBULIN URINE: 0 %
Total Protein, Urine-Ur/day: <100 mg/24 hr (ref 30–150)
Total Protein, Urine: <4 mg/dL
Total Volume: 2500

## 2018-09-07 LAB — IGG, IGA, IGM
IgA: 1268 mg/dL — ABNORMAL HIGH (ref 61–437)
IgG (Immunoglobin G), Serum: 989 mg/dL (ref 603–1613)
IgM (Immunoglobulin M), Srm: 63 mg/dL (ref 15–143)

## 2018-09-08 ENCOUNTER — Telehealth: Payer: Self-pay | Admitting: *Deleted

## 2018-09-08 LAB — PROTEIN ELECTROPHORESIS, SERUM, WITH REFLEX
A/G Ratio: 1.1 (ref 0.7–1.7)
Albumin ELP: 3.6 g/dL (ref 2.9–4.4)
Alpha-1-Globulin: 0.2 g/dL (ref 0.0–0.4)
Alpha-2-Globulin: 0.7 g/dL (ref 0.4–1.0)
Beta Globulin: 1.6 g/dL — ABNORMAL HIGH (ref 0.7–1.3)
Gamma Globulin: 0.9 g/dL (ref 0.4–1.8)
Globulin, Total: 3.4 g/dL (ref 2.2–3.9)
M-Spike, %: 0.7 g/dL — ABNORMAL HIGH
SPEP Interpretation: 0
Total Protein ELP: 7 g/dL (ref 6.0–8.5)

## 2018-09-08 LAB — IMMUNOFIXATION REFLEX, SERUM
IgA: 1299 mg/dL — ABNORMAL HIGH (ref 61–437)
IgG (Immunoglobin G), Serum: 1035 mg/dL (ref 603–1613)
IgM (Immunoglobulin M), Srm: 70 mg/dL (ref 15–143)

## 2018-09-08 NOTE — Telephone Encounter (Signed)
As noted below by Dr. Marin Olp, I informed the wife that his urine protein is better. The kappa chain went from 155 to 103. She verbalized understanding.

## 2018-09-08 NOTE — Telephone Encounter (Signed)
-----   Message from [name], MD sent at 09/07/2018  4:55 PM EDT ----- Call - the urine protein is better.  The kappa chain went from 155 down to 103.  James Marks

## 2018-09-11 ENCOUNTER — Telehealth: Payer: Self-pay | Admitting: *Deleted

## 2018-09-11 NOTE — Telephone Encounter (Signed)
As noted below by Dr. Marin Olp, I informed the wife that the myeloma protein is 0.7. She verbalized understanding.

## 2018-09-11 NOTE — Telephone Encounter (Signed)
-----   Message from [name], MD sent at 09/08/2018  5:09 PM EDT ----- Call - the myeloma protein is lower!!  Now, it is 0.7!!  Was 1.0  pete

## 2018-09-23 DIAGNOSIS — Z23 Encounter for immunization: Secondary | ICD-10-CM | POA: Diagnosis not present

## 2018-11-24 ENCOUNTER — Telehealth: Payer: Self-pay | Admitting: Family Medicine

## 2018-11-24 ENCOUNTER — Encounter: Payer: Self-pay | Admitting: Family Medicine

## 2018-11-24 MED ORDER — PREDNISONE 20 MG PO TABS: 20.0000 mg | ORAL_TABLET | Freq: Two times a day (BID) | ORAL | 0 refills | Status: DC

## 2018-11-24 NOTE — Telephone Encounter (Signed)
Prednisone 20 mg take 2 tablets once daily for 6 days.

## 2018-11-24 NOTE — Telephone Encounter (Signed)
Patient's wife is calling to request a prescription for prednisone 10mg . The patient has a Gout flare up. Patent's wife is declined a office visit.  She said that patient has cancer and is vulnerable.  Patients wife is requesting if this can be completed today.  Pharmacy- [facility].

## 2018-11-24 NOTE — Telephone Encounter (Signed)
Attempted to reach pt, spoke with wife (on [place]) informed her Dr. Erick Blinks message. Rx was sent to pharmacy.

## 2018-11-24 NOTE — Telephone Encounter (Signed)
Please see telephone encounter this was addressed

## 2018-12-01 ENCOUNTER — Encounter: Payer: Self-pay | Admitting: Family Medicine

## 2018-12-01 ENCOUNTER — Ambulatory Visit (INDEPENDENT_AMBULATORY_CARE_PROVIDER_SITE_OTHER): Payer: Medicare Other | Admitting: Family Medicine

## 2018-12-01 ENCOUNTER — Other Ambulatory Visit: Payer: Self-pay

## 2018-12-01 VITALS — BP 118/60 | HR 75 | Temp 98.0°F | Ht 74.0 in | Wt 173.9 lb

## 2018-12-01 DIAGNOSIS — I10 Essential (primary) hypertension: Secondary | ICD-10-CM | POA: Diagnosis not present

## 2018-12-01 DIAGNOSIS — M109 Gout, unspecified: Secondary | ICD-10-CM | POA: Diagnosis not present

## 2018-12-01 MED ORDER — PREDNISONE 10 MG PO TABS: ORAL_TABLET | ORAL | 0 refills | Status: DC

## 2018-12-01 MED ORDER — LOSARTAN POTASSIUM 50 MG PO TABS: 50.0000 mg | ORAL_TABLET | Freq: Every day | ORAL | 3 refills | Status: DC

## 2018-12-01 NOTE — Patient Instructions (Signed)
Stop the lisinopril HCTZ and start the Losartan 50 mg once daily (sent in to [facility])

## 2018-12-01 NOTE — Progress Notes (Signed)
Subjective:     Patient ID: James Marks, male   DOB: June 15, 1940, 78 y.o.   MRN: [id number]  HPI James Marks is seen with persistent gout flare involving left foot metatarsophalangeal joint. Started over a week ago. We have called in some prednisone and started 40 mg daily and he has not seen much improvement. He recalls taking higher doses in the past which seemed to work well. He states he never had gout until he was about 85. He does not consume any beer. No organ meat consumption. He does take lisinopril HCTZ which can trigger gout flareups with the thiazide component. He has not had very frequent flareups in the past but flareups of been fairly severe.  He still played golf couple times this week but yesterday struggled to get through 18 holes. He states his pain is some worse today. No fevers or chills. Blood pressures been very well controlled in the past on lisinopril HCTZ  He has multiple myeloma which is followed by oncology  Past Medical History:  Diagnosis Date  . Cancer ([place])    myeloma  . Diverticulitis   . Hearing loss   . Hx of colonic polyps   . Hyperlipidemia   . Hypertension   . Kidney stones   . Osteopenia   . Smoldering multiple myeloma ([place]) 08/26/2014  . Subarachnoid hemorrhage ([place]) 2/01   nontraumatic   Past Surgical History:  Procedure Laterality Date  . None      reports that he quit smoking about 6 years ago. His smoking use included cigarettes. He started smoking about 66 years ago. He has a 44.00 pack-year smoking history. He quit smokeless tobacco use about 6 years ago. He reports current alcohol use. He reports that he does not use drugs. family history includes Cancer in his mother; Diabetes in his mother and another family member; Diabetes Mellitus II in his brother; Hypertension in his father; Macular degeneration in his brother. No Known Allergies   Review of Systems  Constitutional: Negative for chills, fatigue and fever.  Eyes: Negative for  visual disturbance.  Respiratory: Negative for cough, chest tightness and shortness of breath.   Cardiovascular: Negative for chest pain, palpitations and leg swelling.  Neurological: Negative for dizziness, syncope, weakness, light-headedness and headaches.       Objective:   Physical Exam Constitutional:      Appearance: He is well-developed.  HENT:     Right Ear: External ear normal.     Left Ear: External ear normal.  Eyes:     Pupils: Pupils are equal, round, and reactive to light.  Neck:     Musculoskeletal: Neck supple.     Thyroid: No thyromegaly.  Cardiovascular:     Rate and Rhythm: Normal rate and regular rhythm.  Pulmonary:     Effort: Pulmonary effort is normal. No respiratory distress.     Breath sounds: Normal breath sounds. No wheezing or rales.  Musculoskeletal:     Comments: Left foot reveals swelling redness warmth and tenderness metatarsophalangeal joint. No visible breaks in skin.  Neurological:     Mental Status: He is alert and oriented to person, place, and time.        Assessment:     #1 acute gout involving left foot. Minimal response with lower dose prednisone  #2 hypertension. This has been well controlled. He does take lisinopril HCTZ we explained that HCTZ could be triggering factor for his gout    Plan:     -Send in  prednisone taper starting at 60 mg daily. Reviewed potential side effects -Discussed dietary factors that can affect gout -Discussed possible prophylaxis for gout but would first get off lisinopril HCTZ and transition to losartan 50 mg daily. -Monitor blood pressure closely and be in touch if consistent readings over 140/90 with medication change above -Consider initiating preventative medication if he continues to have flareups after discontinuation of HCTZ  Eulas Post MD [facility] at [facility]

## 2018-12-05 ENCOUNTER — Telehealth: Payer: Self-pay | Admitting: Family Medicine

## 2018-12-05 NOTE — Telephone Encounter (Signed)
Pharm is calling about losartan and lisinopril hctz.  They want to know if one of these needs to be discontinued.  They will be placing the losartan on hold until they hear back.   cb is [id number] Ref number [id number]

## 2018-12-05 NOTE — Telephone Encounter (Signed)
I see Losartan was sent on 12/01/18. Please see message. Please advise.

## 2018-12-05 NOTE — Telephone Encounter (Signed)
See my last note.  Lisinopril HCTZ was stopped and taken off list.

## 2018-12-05 NOTE — Telephone Encounter (Signed)
Called [facility] mail service pharmacy and spoke to [place] and gave her the message from Dr. Elease Hashimoto and also confirmed with pharmacist Nira Conn.

## 2019-01-02 ENCOUNTER — Telehealth: Payer: Self-pay

## 2019-01-02 NOTE — Telephone Encounter (Signed)
Spoke to pt spouse and she stated pt has been on Bp meds for 2 weeks now. Pt blood pressure readings are 150-158's /70-80s. Heart rate and Bp pressure this a.m. was 137/69 pulse is 75. Pt stated he has been having headaches every other day. Pt denies pounding in ears neck and chest.   Spoke to Dr.Burchette verbally and he advise for pt to continue the medication and f/u sooner. Pt has been scheduled.

## 2019-01-02 NOTE — Telephone Encounter (Signed)
Left Mychart message for pt to view in case he forgets. Pt spouse Doris notified of update and verbalized understanding.

## 2019-01-02 NOTE — Telephone Encounter (Signed)
If his systolic , between now and follow up, are consistently > 150 have him titrate the Losartan up to 100 mg daily until follow up.

## 2019-01-10 ENCOUNTER — Other Ambulatory Visit: Payer: Self-pay

## 2019-01-10 ENCOUNTER — Encounter: Payer: Self-pay | Admitting: Family Medicine

## 2019-01-10 ENCOUNTER — Ambulatory Visit (INDEPENDENT_AMBULATORY_CARE_PROVIDER_SITE_OTHER): Payer: Medicare Other | Admitting: Family Medicine

## 2019-01-10 VITALS — BP 142/62 | HR 64 | Temp 97.5°F | Ht 74.0 in | Wt 180.2 lb

## 2019-01-10 DIAGNOSIS — I10 Essential (primary) hypertension: Secondary | ICD-10-CM | POA: Diagnosis not present

## 2019-01-10 DIAGNOSIS — M109 Gout, unspecified: Secondary | ICD-10-CM

## 2019-01-10 MED ORDER — AMLODIPINE BESYLATE 5 MG PO TABS: 5.0000 mg | ORAL_TABLET | Freq: Every day | ORAL | 3 refills | Status: DC

## 2019-01-10 NOTE — Progress Notes (Signed)
Subjective:     Patient ID: James Marks, male   DOB: 1940-11-07, 78 y.o.   MRN: [id number]  HPI Mr. Gadson seen for hypertension follow-up.  He has history of gout and had been taking lisinopril HCTZ.  He had recent flareup which responded with prednisone.  We had discontinued his lisinopril HCTZ because of the gout issue and had prescribe losartan.  We explained that losartan can further lower uric acid levels.  He started losartan 50 mg daily and after starting this had consistently elevated blood pressures to 150s and even 096G systolic.  We had him double up the losartan 200 mg daily December 21 has not seen much improvement on blood pressures.  He had most readings over 836 systolic.  He is not eating a lot of sodium.  No alcohol use.  No further gout flareups since discontinuation of HCTZ.  He has had occasional headaches.  No peripheral edema.  Past Medical History:  Diagnosis Date  . Cancer ([place])    myeloma  . Diverticulitis   . Hearing loss   . Hx of colonic polyps   . Hyperlipidemia   . Hypertension   . Kidney stones   . Osteopenia   . Smoldering multiple myeloma ([place]) 08/26/2014  . Subarachnoid hemorrhage ([place]) 2/01   nontraumatic   Past Surgical History:  Procedure Laterality Date  . None      reports that he quit smoking about 6 years ago. His smoking use included cigarettes. He started smoking about 66 years ago. He has a 44.00 pack-year smoking history. He quit smokeless tobacco use about 6 years ago. He reports current alcohol use. He reports that he does not use drugs. family history includes Cancer in his mother; Diabetes in his mother and another family member; Diabetes Mellitus II in his brother; Hypertension in his father; Macular degeneration in his brother. No Known Allergies   Review of Systems  Constitutional: Negative for appetite change, chills, fatigue, fever and unexpected weight change.  Eyes: Negative for visual disturbance.  Respiratory:  Negative for cough, chest tightness and shortness of breath.   Cardiovascular: Negative for chest pain, palpitations and leg swelling.  Neurological: Negative for dizziness, syncope, weakness and light-headedness.       Objective:   Physical Exam Vitals reviewed.  Constitutional:      Appearance: Normal appearance.  Cardiovascular:     Rate and Rhythm: Normal rate and regular rhythm.  Pulmonary:     Effort: Pulmonary effort is normal.     Breath sounds: Normal breath sounds.  Musculoskeletal:     Cervical back: Neck supple.     Right lower leg: No edema.     Left lower leg: No edema.  Lymphadenopathy:     Cervical: No cervical adenopathy.  Neurological:     General: No focal deficit present.     Mental Status: He is alert.     Cranial Nerves: No cranial nerve deficit.        Assessment:     #1 gout.  No further flareups since discontinuation of HCTZ  #2 hypertension poorly controlled by several home readings.  Repeat here left arm seated after rest 142/62    Plan:     -Drop losartan back to 50 mg once daily -Add amlodipine 5 mg daily -Continue to monitor blood pressures at home -Watch sodium intake closely -Set up 1 month follow-up to reassess blood pressure with changes above  Eulas Post MD [facility] at [facility]

## 2019-01-10 NOTE — Patient Instructions (Signed)
Continue with the Losartan one daily  Start Amlodipine 5 mg once daily  Follow up in one month and bring your cuff with you at follow up.

## 2019-02-10 ENCOUNTER — Ambulatory Visit: Payer: Medicare Other

## 2019-02-11 ENCOUNTER — Ambulatory Visit: Payer: Medicare Other

## 2019-02-12 ENCOUNTER — Other Ambulatory Visit: Payer: Self-pay

## 2019-02-12 ENCOUNTER — Encounter: Payer: Self-pay | Admitting: Family Medicine

## 2019-02-12 ENCOUNTER — Ambulatory Visit (INDEPENDENT_AMBULATORY_CARE_PROVIDER_SITE_OTHER): Payer: Medicare Other | Admitting: Family Medicine

## 2019-02-12 VITALS — BP 148/80 | HR 60 | Temp 97.3°F | Ht 74.0 in | Wt 181.7 lb

## 2019-02-12 DIAGNOSIS — I10 Essential (primary) hypertension: Secondary | ICD-10-CM | POA: Diagnosis not present

## 2019-02-12 DIAGNOSIS — N1832 Chronic kidney disease, stage 3b: Secondary | ICD-10-CM

## 2019-02-12 DIAGNOSIS — M109 Gout, unspecified: Secondary | ICD-10-CM

## 2019-02-12 MED ORDER — AMLODIPINE BESYLATE 5 MG PO TABS: 5.0000 mg | ORAL_TABLET | Freq: Every day | ORAL | 3 refills | Status: DC

## 2019-02-12 NOTE — Progress Notes (Signed)
Subjective:     Patient ID: James Marks, male   DOB: 1940/12/08, 79 y.o.   MRN: [id number]  HPI   James Marks has history of hypertension, chronic kidney disease, multiple myeloma.  He is followed regularly by oncology.  He has history of gout and we had discontinued his hydrochlorothiazide to try to help reduce frequency.  He has not seen any recent gout flareups.  He was on losartan and we added amlodipine 5 mg daily.  His losartan is 50 mg.  He brings in multiple blood pressure readings and overall these have trended better.  His readings in the mornings have been consistently 569-794 systolic and well-controlled diastolic but he has had some occasional 80 readings are 801 systolic.  No regular alcohol use.  Watches sodium intake fairly closely.  Denies any side effects from amlodipine.  No headaches.  No peripheral edema.  He has chronic kidney disease with GFR around 35.  No nonsteroidal use.  Past Medical History:  Diagnosis Date  . Cancer ([place])    myeloma  . Diverticulitis   . Hearing loss   . Hx of colonic polyps   . Hyperlipidemia   . Hypertension   . Kidney stones   . Osteopenia   . Smoldering multiple myeloma ([place]) 08/26/2014  . Subarachnoid hemorrhage ([place]) 2/01   nontraumatic   Past Surgical History:  Procedure Laterality Date  . None      reports that he quit smoking about 6 years ago. His smoking use included cigarettes. He started smoking about 66 years ago. He has a 44.00 pack-year smoking history. He quit smokeless tobacco use about 6 years ago. He reports current alcohol use. He reports that he does not use drugs. family history includes Cancer in his mother; Diabetes in his mother and another family member; Diabetes Mellitus II in his brother; Hypertension in his father; Macular degeneration in his brother. No Known Allergies   Review of Systems  Constitutional: Negative for chills, fatigue and fever.  Eyes: Negative for visual disturbance.  Respiratory:  Negative for cough, chest tightness and shortness of breath.   Cardiovascular: Negative for chest pain, palpitations and leg swelling.  Neurological: Negative for dizziness, syncope, weakness, light-headedness and headaches.       Objective:   Physical Exam Constitutional:      Appearance: He is well-developed.  HENT:     Right Ear: External ear normal.     Left Ear: External ear normal.  Eyes:     Pupils: Pupils are equal, round, and reactive to light.  Neck:     Thyroid: No thyromegaly.  Cardiovascular:     Rate and Rhythm: Normal rate and regular rhythm.  Pulmonary:     Effort: Pulmonary effort is normal. No respiratory distress.     Breath sounds: Normal breath sounds. No wheezing or rales.  Musculoskeletal:     Cervical back: Neck supple.     Right lower leg: No edema.     Left lower leg: No edema.  Neurological:     Mental Status: He is alert and oriented to person, place, and time.        Assessment:     #1 hypertension.  Overall improved.  He still has some occasional readings up around 150 and these tend to be late in the day.  #2 gout.  Improved with decreased frequency of flareups since discontinuation of HCTZ  #3 chronic kidney disease stage III  #4 multiple myeloma followed by oncology  Plan:     -We decided to continue with his current regimen.  Be in touch of consistent readings over 161 systolic.  Also advised to try to increase his walking and activity levels. -Reiterated the importance of watching sodium intake. -Plan schedule follow-up in 3 months and sooner as needed -Refill amlodipine for 64-monthsupply with 3 refills  BEulas PostMD [facility]

## 2019-02-12 NOTE — Patient Instructions (Signed)

## 2019-02-15 ENCOUNTER — Ambulatory Visit: Payer: Medicare Other

## 2019-02-16 ENCOUNTER — Ambulatory Visit: Payer: Medicare Other

## 2019-02-18 ENCOUNTER — Ambulatory Visit: Payer: Medicare Other | Attending: Internal Medicine

## 2019-02-18 DIAGNOSIS — Z23 Encounter for immunization: Secondary | ICD-10-CM | POA: Insufficient documentation

## 2019-02-18 NOTE — Progress Notes (Signed)
   Covid-19 Vaccination Clinic  Name:  James Marks    MRN: [id number] DOB: 03-Jan-1941  02/18/2019  Mr. James Marks was observed post Covid-19 immunization for 15 minutes without incidence. He was provided with Vaccine Information Sheet and instruction to access the V-Safe system.   Mr. James Marks was instructed to call 911 with any severe reactions post vaccine: Marland Kitchen Difficulty breathing  . Swelling of your face and throat  . A fast heartbeat  . A bad rash all over your body  . Dizziness and weakness    Immunizations Administered    Name Date Dose VIS Date Route   Pfizer COVID-19 Vaccine 02/18/2019  8:36 AM 0.3 mL 12/22/2018 Intramuscular   Manufacturer: [place]   Lot: [id number]   [place]: [id number]

## 2019-03-01 ENCOUNTER — Telehealth: Payer: Self-pay | Admitting: Family Medicine

## 2019-03-01 NOTE — Telephone Encounter (Signed)
Spoke to pt wife. She will talk to patient to see if he wants to schedule and call us back. pt due to schedule Medicare Annual Wellness Visit (AWV) either virtually,audio only or in person (whichever the patient prefers--45 MINUTES).  Last AWV 10.6.16; please schedule at anytime with [facility] at [facility] [place].

## 2019-03-05 ENCOUNTER — Inpatient Hospital Stay: Payer: Medicare Other | Attending: Hematology & Oncology

## 2019-03-05 ENCOUNTER — Other Ambulatory Visit: Payer: Self-pay

## 2019-03-05 ENCOUNTER — Other Ambulatory Visit: Payer: Self-pay | Admitting: *Deleted

## 2019-03-05 DIAGNOSIS — D472 Monoclonal gammopathy: Secondary | ICD-10-CM

## 2019-03-05 DIAGNOSIS — I10 Essential (primary) hypertension: Secondary | ICD-10-CM | POA: Insufficient documentation

## 2019-03-05 DIAGNOSIS — Z79899 Other long term (current) drug therapy: Secondary | ICD-10-CM | POA: Insufficient documentation

## 2019-03-05 DIAGNOSIS — C9000 Multiple myeloma not having achieved remission: Secondary | ICD-10-CM

## 2019-03-07 ENCOUNTER — Other Ambulatory Visit: Payer: Self-pay

## 2019-03-07 ENCOUNTER — Encounter: Payer: Self-pay | Admitting: Hematology & Oncology

## 2019-03-07 ENCOUNTER — Inpatient Hospital Stay (HOSPITAL_BASED_OUTPATIENT_CLINIC_OR_DEPARTMENT_OTHER): Payer: Medicare Other | Admitting: Hematology & Oncology

## 2019-03-07 ENCOUNTER — Inpatient Hospital Stay: Payer: Medicare Other

## 2019-03-07 VITALS — BP 130/65 | HR 54 | Temp 97.8°F | Resp 20 | Wt 179.1 lb

## 2019-03-07 DIAGNOSIS — D472 Monoclonal gammopathy: Secondary | ICD-10-CM

## 2019-03-07 DIAGNOSIS — C9000 Multiple myeloma not having achieved remission: Secondary | ICD-10-CM | POA: Diagnosis not present

## 2019-03-07 DIAGNOSIS — I10 Essential (primary) hypertension: Secondary | ICD-10-CM | POA: Diagnosis not present

## 2019-03-07 DIAGNOSIS — Z79899 Other long term (current) drug therapy: Secondary | ICD-10-CM | POA: Diagnosis not present

## 2019-03-07 LAB — CBC WITH DIFFERENTIAL (CANCER CENTER ONLY)
Abs Immature Granulocytes: 0.03 10*3/uL (ref 0.00–0.07)
Basophils Absolute: 0.1 10*3/uL (ref 0.0–0.1)
Basophils Relative: 1 %
Eosinophils Absolute: 0.2 10*3/uL (ref 0.0–0.5)
Eosinophils Relative: 3 %
HCT: 40.3 % (ref 39.0–52.0)
Hemoglobin: 12.6 g/dL — ABNORMAL LOW (ref 13.0–17.0)
Immature Granulocytes: 0 %
Lymphocytes Relative: 24 %
Lymphs Abs: 1.7 10*3/uL (ref 0.7–4.0)
MCH: 27.9 pg (ref 26.0–34.0)
MCHC: 31.3 g/dL (ref 30.0–36.0)
MCV: 89.2 fL (ref 80.0–100.0)
Monocytes Absolute: 0.5 10*3/uL (ref 0.1–1.0)
Monocytes Relative: 8 %
Neutro Abs: 4.6 10*3/uL (ref 1.7–7.7)
Neutrophils Relative %: 64 %
Platelet Count: 254 10*3/uL (ref 150–400)
RBC: 4.52 MIL/uL (ref 4.22–5.81)
RDW: 12.9 % (ref 11.5–15.5)
WBC Count: 7.2 10*3/uL (ref 4.0–10.5)
nRBC: 0 % (ref 0.0–0.2)

## 2019-03-07 LAB — CMP (CANCER CENTER ONLY)
ALT: 10 U/L (ref 0–44)
AST: 14 U/L — ABNORMAL LOW (ref 15–41)
Albumin: 3.9 g/dL (ref 3.5–5.0)
Alkaline Phosphatase: 90 U/L (ref 38–126)
Anion gap: 7 (ref 5–15)
BUN: 27 mg/dL — ABNORMAL HIGH (ref 8–23)
CO2: 27 mmol/L (ref 22–32)
Calcium: 9.3 mg/dL (ref 8.9–10.3)
Chloride: 107 mmol/L (ref 98–111)
Creatinine: 1.79 mg/dL — ABNORMAL HIGH (ref 0.61–1.24)
GFR, Est AFR Am: 41 mL/min — ABNORMAL LOW (ref >60)
GFR, Estimated: 35 mL/min — ABNORMAL LOW (ref >60)
Glucose, Bld: 101 mg/dL — ABNORMAL HIGH (ref 70–99)
Potassium: 4.8 mmol/L (ref 3.5–5.1)
Sodium: 141 mmol/L (ref 135–145)
Total Bilirubin: 0.6 mg/dL (ref 0.3–1.2)
Total Protein: 7.8 g/dL (ref 6.5–8.1)

## 2019-03-07 NOTE — Progress Notes (Signed)
Hematology and Oncology Follow Up Visit  James Marks [id number] [birth year] 79 y.o. 03/07/2019   Principle Diagnosis:   IgA kappa smoldering myeloma-normal chromosomes  Current Therapy:    Observation     Interim History:  James Marks is back for follow-up.  He is doing okay.  He and his wife are being very cautious with the coronavirus.  I saw him 6 months ago.  Since then, he really has not been doing anything different.  He has not been playing too much golf.  We last saw him, we did do a 24-hour urine on him.  His kappa light chain was actually improved at 103 mg/day.  In his serum studies, his M spike was down to 0.7 g/dL.  The IgA level was 1280 mg/dL.  His kappa light chain was 10.8 milligrams per deciliter.  He has had no problem with fever.  He has had no cough.  He has had no change in bowel or bladder habits.  There is been no rashes.  He has had no leg swelling.   Overall, I would say performed status is ECOG 1.    Medications:  Current Outpatient Medications:  .  amLODipine (NORVASC) 5 MG tablet, Take 1 tablet (5 mg total) by mouth daily., Disp: 90 tablet, Rfl: 3 .  gabapentin (NEURONTIN) 300 MG capsule, TAKE 1 CAPSULE WITH BREAKFAST AND DINNER, THEN 2 AT BEDTIME., Disp: 360 capsule, Rfl: 4 .  losartan (COZAAR) 50 MG tablet, Take 1 tablet (50 mg total) by mouth daily., Disp: 90 tablet, Rfl: 3 No current facility-administered medications for this visit.  Facility-Administered Medications Ordered in Other Visits:  .  0.9 %  sodium chloride infusion, , Intravenous, Continuous, [place], Rudell Cobb, MD, Stopped at 08/05/14 1710  Allergies: No Known Allergies  Past Medical History, Surgical history, Social history, and Family History were reviewed and updated.  Review of Systems: Review of Systems  Constitutional: Positive for malaise/fatigue.  HENT: Positive for congestion.   Eyes: Negative.   Respiratory: Positive for shortness of breath.   Cardiovascular:  Positive for palpitations.  Gastrointestinal: Negative.   Genitourinary: Negative.   Musculoskeletal: Positive for back pain and myalgias.  Skin: Negative.   Neurological: Positive for tingling.  Endo/Heme/Allergies: Negative.   Psychiatric/Behavioral: Negative.     Physical Exam:  weight is 179 lb 1.3 oz (81.2 kg). His temporal temperature is 97.8 F (36.6 C). His blood pressure is 130/65 and his pulse is 54 (abnormal). His respiration is 20 and oxygen saturation is 100%.   Wt Readings from Last 3 Encounters:  03/07/19 179 lb 1.3 oz (81.2 kg)  02/12/19 181 lb 11.2 oz (82.4 kg)  01/10/19 180 lb 3.2 oz (81.7 kg)     Physical Exam Vitals reviewed.  HENT:     Head: Normocephalic and atraumatic.  Eyes:     Pupils: Pupils are equal, round, and reactive to light.  Cardiovascular:     Rate and Rhythm: Normal rate and regular rhythm.     Heart sounds: Normal heart sounds.  Pulmonary:     Effort: Pulmonary effort is normal.     Breath sounds: Normal breath sounds.  Abdominal:     General: Bowel sounds are normal.     Palpations: Abdomen is soft.  Musculoskeletal:        General: No tenderness or deformity. Normal range of motion.     Cervical back: Normal range of motion.  Lymphadenopathy:     Cervical: No cervical adenopathy.  Skin:  General: Skin is warm and dry.     Findings: No erythema or rash.  Neurological:     Mental Status: He is alert and oriented to person, place, and time.  Psychiatric:        Behavior: Behavior normal.        Thought Content: Thought content normal.        Judgment: Judgment normal.      Lab Results  Component Value Date   WBC 7.2 03/07/2019   HGB 12.6 (L) 03/07/2019   HCT 40.3 03/07/2019   MCV 89.2 03/07/2019   PLT 254 03/07/2019     Chemistry      Component Value Date/Time   NA 141 09/06/2018 1014   NA 143 12/06/2016 1156   NA 139 08/02/2016 1146   K 5.3 (H) 09/06/2018 1014   K 4.2 12/06/2016 1156   K 4.7 08/02/2016 1146     CL 107 09/06/2018 1014   CL 107 12/06/2016 1156   CO2 28 09/06/2018 1014   CO2 28 12/06/2016 1156   CO2 24 08/02/2016 1146   BUN 31 (H) 09/06/2018 1014   BUN 29 (H) 12/06/2016 1156   BUN 34.3 (H) 08/02/2016 1146   CREATININE 1.89 (H) 09/06/2018 1014   CREATININE 2.1 (H) 12/06/2016 1156   CREATININE 1.7 (H) 08/02/2016 1146      Component Value Date/Time   CALCIUM 8.5 (L) 09/06/2018 1014   CALCIUM 9.1 12/06/2016 1156   CALCIUM 9.5 08/02/2016 1146   ALKPHOS 101 09/06/2018 1014   ALKPHOS 108 (H) 12/06/2016 1156   ALKPHOS 115 08/02/2016 1146   AST 10 (L) 09/06/2018 1014   AST 14 08/02/2016 1146   ALT 7 09/06/2018 1014   ALT 18 12/06/2016 1156   ALT 9 08/02/2016 1146   BILITOT 0.3 09/06/2018 1014   BILITOT 0.41 08/02/2016 1146         Impression and Plan: James Marks is 79 year old man with IgA kappa smoldering myeloma.   We will have to see what his M spike is this time.  So far, we be very conservative with respect to having to treat him.  He really has had no symptoms.  I will plan for another follow-up in 6 more months.      [name], MD 2/24/202110:42 AM

## 2019-03-08 LAB — UIFE/LIGHT CHAINS/TP QN, 24-HR UR
FR KAPPA LT CH,24HR: 267.71 mg/24 hr
FR LAMBDA LT CH,24HR: 8.93 mg/24 hr
Free Kappa Lt Chains,Ur: 127.48 mg/L — ABNORMAL HIGH (ref 0.63–113.79)
Free Kappa/Lambda Ratio: 30 (ref 1.03–31.76)
Free Lambda Lt Chains,Ur: 4.25 mg/L (ref 0.47–11.77)
Total Protein, Urine-Ur/day: 116 mg/24 hr (ref 30–150)
Total Protein, Urine: 5.5 mg/dL
Total Volume: 2100

## 2019-03-08 LAB — IGG, IGA, IGM
IgA: 994 mg/dL — ABNORMAL HIGH (ref 61–437)
IgG (Immunoglobin G), Serum: 1011 mg/dL (ref 603–1613)
IgM (Immunoglobulin M), Srm: 70 mg/dL (ref 15–143)

## 2019-03-08 LAB — KAPPA/LAMBDA LIGHT CHAINS
Kappa free light chain: 100 mg/L — ABNORMAL HIGH (ref 3.3–19.4)
Kappa, lambda light chain ratio: 3.77 — ABNORMAL HIGH (ref 0.26–1.65)
Lambda free light chains: 26.5 mg/L — ABNORMAL HIGH (ref 5.7–26.3)

## 2019-03-12 LAB — PROTEIN ELECTROPHORESIS, SERUM, WITH REFLEX
A/G Ratio: 1.2 (ref 0.7–1.7)
Albumin ELP: 4.1 g/dL (ref 2.9–4.4)
Alpha-1-Globulin: 0.2 g/dL (ref 0.0–0.4)
Alpha-2-Globulin: 0.7 g/dL (ref 0.4–1.0)
Beta Globulin: 1.6 g/dL — ABNORMAL HIGH (ref 0.7–1.3)
Gamma Globulin: 0.9 g/dL (ref 0.4–1.8)
Globulin, Total: 3.4 g/dL (ref 2.2–3.9)
M-Spike, %: 0.6 g/dL — ABNORMAL HIGH
SPEP Interpretation: 0
Total Protein ELP: 7.5 g/dL (ref 6.0–8.5)

## 2019-03-12 LAB — IMMUNOFIXATION REFLEX, SERUM
IgA: 1081 mg/dL — ABNORMAL HIGH (ref 61–437)
IgG (Immunoglobin G), Serum: 1069 mg/dL (ref 603–1613)
IgM (Immunoglobulin M), Srm: 69 mg/dL (ref 15–143)

## 2019-03-14 ENCOUNTER — Ambulatory Visit: Payer: Medicare Other | Attending: Internal Medicine

## 2019-03-14 DIAGNOSIS — Z23 Encounter for immunization: Secondary | ICD-10-CM | POA: Insufficient documentation

## 2019-03-14 NOTE — Progress Notes (Signed)
   Covid-19 Vaccination Clinic  Name:  James Marks    MRN: [id number] DOB: [birth year]  03/14/2019  James Marks was observed post Covid-19 immunization for 15 minutes without incident. He was provided with Vaccine Information Sheet and instruction to access the V-Safe system.   James Marks was instructed to call 911 with any severe reactions post vaccine: Marland Kitchen Difficulty breathing  . Swelling of face and throat  . A fast heartbeat  . A bad rash all over body  . Dizziness and weakness   Immunizations Administered    Name Date Dose VIS Date Route   Pfizer COVID-19 Vaccine 03/14/2019  3:37 PM 0.3 mL 12/22/2018 Intramuscular   Manufacturer: [place]   Lot: [id number]   [place]: [id number]

## 2019-03-15 ENCOUNTER — Encounter: Payer: Self-pay | Admitting: *Deleted

## 2019-05-22 ENCOUNTER — Telehealth: Payer: Self-pay | Admitting: Family Medicine

## 2019-05-22 MED ORDER — PREDNISONE 20 MG PO TABS: 20.0000 mg | ORAL_TABLET | Freq: Two times a day (BID) | ORAL | 0 refills | Status: DC

## 2019-05-22 MED ORDER — PREDNISONE 20 MG PO TABS
40.0000 mg | ORAL_TABLET | Freq: Every day | ORAL | 0 refills | Status: DC
Start: 2019-05-22 — End: 2019-05-29

## 2019-05-22 NOTE — Telephone Encounter (Signed)
Pt's spouse, Treye Ludington, stated that his gout is flaring up and does not have any prednisone. She is wondering if his PCP can call some in at the pharmacy that is near their hotel.   Medication Refill:  Prednisone  [facility] [address] [place] FAX: [place] can be reached at [phone]

## 2019-05-22 NOTE — Telephone Encounter (Signed)
Please advise 

## 2019-05-22 NOTE — Telephone Encounter (Signed)
May send in Prednisone 20 mg - take two tablets once daily for 6 days.

## 2019-05-22 NOTE — Addendum Note (Signed)
Addended by: Modena Morrow R on: 05/22/2019 01:14 PM   Modules accepted: Orders

## 2019-05-22 NOTE — Telephone Encounter (Signed)
Called pharmacy and canceled first prescription of prednisone that got sent in wrong and has been corrected  and pt notified that is has been sent in

## 2019-05-22 NOTE — Addendum Note (Signed)
Addended by: Modena Morrow R on: 05/22/2019 01:17 PM   Modules accepted: Orders

## 2019-05-28 NOTE — Telephone Encounter (Signed)
Please advise 

## 2019-05-28 NOTE — Telephone Encounter (Signed)
Patient is scheduled for in office tomorrow at 4 PM

## 2019-05-28 NOTE — Telephone Encounter (Signed)
If he did not respond to the prednisone and I recommend he be seen as nonresponse to prednisone is fairly atypical for gout

## 2019-05-28 NOTE — Telephone Encounter (Signed)
Pt's spouse, Tamela Oddi, stated that her husband still has gout and wondering if another prescription for prednisone can be sent in?  I offered to set up an appt with Dr Elease Hashimoto but wife said the pt wanted the prescription called in. They are back in town and [facility] [place]: [place] [address] FAX: [place] can be reached at [phone]

## 2019-05-29 ENCOUNTER — Encounter: Payer: Self-pay | Admitting: Family Medicine

## 2019-05-29 ENCOUNTER — Ambulatory Visit (INDEPENDENT_AMBULATORY_CARE_PROVIDER_SITE_OTHER): Payer: Medicare Other | Admitting: Family Medicine

## 2019-05-29 ENCOUNTER — Other Ambulatory Visit: Payer: Self-pay

## 2019-05-29 VITALS — BP 124/72 | HR 77 | Temp 98.4°F | Wt 176.9 lb

## 2019-05-29 DIAGNOSIS — M109 Gout, unspecified: Secondary | ICD-10-CM | POA: Diagnosis not present

## 2019-05-29 MED ORDER — PREDNISONE 20 MG PO TABS: ORAL_TABLET | ORAL | 1 refills | Status: DC

## 2019-05-29 NOTE — Patient Instructions (Signed)
Gout  Gout is a condition that causes painful swelling of the joints. Gout is a type of inflammation of the joints (arthritis). This condition is caused by having too much uric acid in the body. Uric acid is a chemical that forms when the body breaks down substances called purines. Purines are important for building body proteins. When the body has too much uric acid, sharp crystals can form and build up inside the joints. This causes pain and swelling. Gout attacks can happen quickly and may be very painful (acute gout). Over time, the attacks can affect more joints and become more frequent (chronic gout). Gout can also cause uric acid to build up under the skin and inside the kidneys. What are the causes? This condition is caused by too much uric acid in your blood. This can happen because:  Your kidneys do not remove enough uric acid from your blood. This is the most common cause.  Your body makes too much uric acid. This can happen with some cancers and cancer treatments. It can also occur if your body is breaking down too many red blood cells (hemolytic anemia).  You eat too many foods that are high in purines. These foods include organ meats and some seafood. Alcohol, especially beer, is also high in purines. A gout attack may be triggered by trauma or stress. What increases the risk? You are more likely to develop this condition if you:  Have a family history of gout.  Are male and middle-aged.  Are male and have gone through menopause.  Are obese.  Frequently drink alcohol, especially beer.  Are dehydrated.  Lose weight too quickly.  Have an organ transplant.  Have lead poisoning.  Take certain medicines, including aspirin, cyclosporine, diuretics, levodopa, and niacin.  Have kidney disease.  Have a skin condition called psoriasis. What are the signs or symptoms? An attack of acute gout happens quickly. It usually occurs in just one joint. The most common place is  the big toe. Attacks often start at night. Other joints that may be affected include joints of the feet, ankle, knee, fingers, wrist, or elbow. Symptoms of this condition may include:  Severe pain.  Warmth.  Swelling.  Stiffness.  Tenderness. The affected joint may be very painful to touch.  Shiny, red, or purple skin.  Chills and fever. Chronic gout may cause symptoms more frequently. More joints may be involved. You may also have white or yellow lumps (tophi) on your hands or feet or in other areas near your joints. How is this diagnosed? This condition is diagnosed based on your symptoms, medical history, and physical exam. You may have tests, such as:  Blood tests to measure uric acid levels.  Removal of joint fluid with a thin needle (aspiration) to look for uric acid crystals.  X-rays to look for joint damage. How is this treated? Treatment for this condition has two phases: treating an acute attack and preventing future attacks. Acute gout treatment may include medicines to reduce pain and swelling, including:  NSAIDs.  Steroids. These are strong anti-inflammatory medicines that can be taken by mouth (orally) or injected into a joint.  Colchicine. This medicine relieves pain and swelling when it is taken soon after an attack. It can be given by mouth or through an IV. Preventive treatment may include:  Daily use of smaller doses of NSAIDs or colchicine.  Use of a medicine that reduces uric acid levels in your blood.  Changes to your diet. You may   need to see a dietitian about what to eat and drink to prevent gout. Follow these instructions at home: During a gout attack   If directed, put ice on the affected area: ? Put ice in a plastic bag. ? Place a towel between your skin and the bag. ? Leave the ice on for 20 minutes, 2-3 times a day.  Raise (elevate) the affected joint above the level of your heart as often as possible.  Rest the joint as much as possible.  If the affected joint is in your leg, you may be given crutches to use.  Follow instructions from your health care provider about eating or drinking restrictions. Avoiding future gout attacks  Follow a low-purine diet as told by your dietitian or health care provider. Avoid foods and drinks that are high in purines, including liver, kidney, anchovies, asparagus, herring, mushrooms, mussels, and beer.  Maintain a healthy weight or lose weight if you are overweight. If you want to lose weight, talk with your health care provider. It is important that you do not lose weight too quickly.  Start or maintain an exercise program as told by your health care provider. Eating and drinking  Drink enough fluids to keep your urine pale yellow.  If you drink alcohol: ? Limit how much you use to:  0-1 drink a day for women.  0-2 drinks a day for men. ? Be aware of how much alcohol is in your drink. In the [place] one drink equals one 12 oz bottle of beer (355 mL) one 5 oz glass of wine (148 mL), or one 1 oz glass of hard liquor (44 mL). General instructions  Take over-the-counter and prescription medicines only as told by your health care provider.  Do not drive or use heavy machinery while taking prescription pain medicine.  Return to your normal activities as told by your health care provider. Ask your health care provider what activities are safe for you.  Keep all follow-up visits as told by your health care provider. This is important. Contact a health care provider if you have:  Another gout attack.  Continuing symptoms of a gout attack after 10 days of treatment.  Side effects from your medicines.  Chills or a fever.  Burning pain when you urinate.  Pain in your lower back or belly. Get help right away if you:  Have severe or uncontrolled pain.  Cannot urinate. Summary  Gout is painful swelling of the joints caused by inflammation.  The most common site of pain is the big  toe, but it can affect other joints in the body.  Medicines and dietary changes can help to prevent and treat gout attacks. This information is not intended to replace advice given to you by your health care provider. Make sure you discuss any questions you have with your health care provider. Document Revised: 07/20/2017 Document Reviewed: 07/20/2017 Elsevier Patient Education  2020 Elsevier Inc.  

## 2019-05-29 NOTE — Progress Notes (Signed)
  Subjective:     Patient ID: James Marks, male   DOB: [birth year], 79 y.o.   MRN: [id number]  HPI   [place] was [place] the beach [place] playing golf and developed acute right foot pain MTP joint consistent with prior gout episodes.  We called in prednisone 20 mg 2 tablets daily for 5 days and his symptoms were improved but never fully resolved.  They did call for refill.  At that point, we recommend office follow-up to further assess.  No fever.  No chills.  Pain is moderate.  We had discontinued his hydrochlorothiazide during the past year and hopes that this would reduce gout flareups.  He is only had a couple of flareups in the past year and a half.  He does not drink any alcohol currently.  Did eat a few shrimp last week  Past Medical History:  Diagnosis Date  . Cancer ([place])    myeloma  . Diverticulitis   . Hearing loss   . Hx of colonic polyps   . Hyperlipidemia   . Hypertension   . Kidney stones   . Osteopenia   . Smoldering multiple myeloma ([place]) 08/26/2014  . Subarachnoid hemorrhage ([place]) 2/01   nontraumatic   Past Surgical History:  Procedure Laterality Date  . None      reports that he quit smoking about 7 years ago. His smoking use included cigarettes. He started smoking about 66 years ago. He has a 44.00 pack-year smoking history. He quit smokeless tobacco use about 6 years ago. He reports current alcohol use. He reports that he does not use drugs. family history includes Cancer in his mother; Diabetes in his mother and another family member; Diabetes Mellitus II in his brother; Hypertension in his father; Macular degeneration in his brother. No Known Allergies   Review of Systems  Constitutional: Negative for chills and fever.  Respiratory: Negative for shortness of breath.   Cardiovascular: Negative for chest pain.  Musculoskeletal: Positive for arthralgias.       Objective:   Physical Exam Vitals reviewed.  Constitutional:      Appearance: Normal appearance.   Cardiovascular:     Rate and Rhythm: Normal rate and regular rhythm.  Musculoskeletal:     Comments: Mild swelling warmth and minimal redness right MTP joint.  No ulcerations.  Tender to palpation.  Neurological:     Mental Status: He is alert.        Assessment:     Acute gout involving right MTP joint    Plan:     -Discussed dietary factors with handout given -We agreed to another prednisone taper starting at 60 mg and taper over 8 days. -We discussed issues of prophylaxis but at this point is only having about 1 flareup per year and he declines. -Stay well-hydrated  Eulas Post MD [facility] at [facility]

## 2019-06-13 ENCOUNTER — Other Ambulatory Visit: Payer: Self-pay

## 2019-06-13 ENCOUNTER — Telehealth: Payer: Self-pay | Admitting: Family Medicine

## 2019-06-13 ENCOUNTER — Encounter: Payer: Self-pay | Admitting: Family Medicine

## 2019-06-13 ENCOUNTER — Ambulatory Visit (INDEPENDENT_AMBULATORY_CARE_PROVIDER_SITE_OTHER): Payer: Medicare Other | Admitting: Family Medicine

## 2019-06-13 VITALS — BP 133/77 | HR 66 | Temp 98.4°F | Resp 16 | Ht 74.0 in | Wt 175.8 lb

## 2019-06-13 DIAGNOSIS — L98491 Non-pressure chronic ulcer of skin of other sites limited to breakdown of skin: Secondary | ICD-10-CM | POA: Diagnosis not present

## 2019-06-13 DIAGNOSIS — L02224 Furuncle of groin: Secondary | ICD-10-CM

## 2019-06-13 MED ORDER — MUPIROCIN 2 % EX OINT
1.0000 | TOPICAL_OINTMENT | Freq: Three times a day (TID) | CUTANEOUS | 0 refills | Status: DC
Start: 2019-06-13 — End: 2019-09-04

## 2019-06-13 NOTE — Progress Notes (Signed)
OFFICE VISIT  06/20/2019   CC:  Chief Complaint  Patient presents with  . Growth in groin area, oozing and painful for 3-4 days now   HPI:    Patient is a 79 y.o. Caucasian male who presents accompanied by his wife for "growth in groin area, oozing".  Noted small swelling/bump in L groin crease about 4-5 d/a, enlarging. Played golf yesterday and noted afterwards the area had started leaking, superficial opening.  Hurt a little yesterday, a little sore to touch. No f/c/malaise.  Applied some antibiotic cream recently.  No heat applied.  Tried ice x 3. No similar problem before.  Just finished a round of prednisone for gout. Pertinent hx of MM, quiescent at this time.  Past Medical History:  Diagnosis Date  . Cancer ([place])    myeloma  . Diverticulitis   . Hearing loss   . Hx of colonic polyps   . Hyperlipidemia   . Hypertension   . Kidney stones   . Osteopenia   . 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 Subarachnoid hemorrhage ([place]) 2/01   nontraumatic    Past Surgical History:  Procedure Laterality Date  . None      Outpatient Medications Prior to Visit  Medication Sig Dispense Refill  . amLODipine (NORVASC) 5 MG tablet Take 1 tablet (5 mg total) by mouth daily. 90 tablet 3  . gabapentin (NEURONTIN) 300 MG capsule TAKE 1 CAPSULE WITH BREAKFAST AND DINNER, THEN 2 AT BEDTIME. 360 capsule 4  . losartan (COZAAR) 50 MG tablet Take 1 tablet (50 mg total) by mouth daily. 90 tablet 3  . predniSONE (DELTASONE) 20 MG tablet Taper as follows: 3-3-2-2-1-1-1/2-1/2 (Patient not taking: Reported on 06/13/2019) 13 tablet 1   Facility-Administered Medications Prior to Visit  Medication Dose Route Frequency Provider Last Rate Last Admin  . 0.9 %  sodium chloride infusion   Intravenous Continuous [name], MD   Stopped at 08/05/14 1710    No Known Allergies  ROS As per HPI  PE: Blood pressure 133/77, pulse 66, temperature 98.4 F (36.9 C), temperature source  Temporal, resp. rate 16, height 6' 2" (1.88 m), weight 175 lb 12.8 oz (79.7 kg), SpO2 98 %. Gen: Alert, well appearing.  Patient is oriented to person, place, time, and situation. Left groin crease with approx 4-5 mm superficial ulceration with pink center and moisture but no active drainage.  No fluctuance or induration or odor.  No signif TTP  No GU rash.  No adenopathy.  Penis and testicles normal.   LABS:    Chemistry      Component Value Date/Time   NA 141 03/07/2019 1007   NA 143 12/06/2016 1156   NA 139 08/02/2016 1146   K 4.8 03/07/2019 1007   K 4.2 12/06/2016 1156   K 4.7 08/02/2016 1146   CL 107 03/07/2019 1007   CL 107 12/06/2016 1156   CO2 27 03/07/2019 1007   CO2 28 12/06/2016 1156   CO2 24 08/02/2016 1146   BUN 27 (H) 03/07/2019 1007   BUN 29 (H) 12/06/2016 1156   BUN 34.3 (H) 08/02/2016 1146   CREATININE 1.79 (H) 03/07/2019 1007   CREATININE 2.1 (H) 12/06/2016 1156   CREATININE 1.7 (H) 08/02/2016 1146      Component Value Date/Time   CALCIUM 9.3 03/07/2019 1007   CALCIUM 9.1 12/06/2016 1156   CALCIUM 9.5 08/02/2016 1146   ALKPHOS 90 03/07/2019 1007   ALKPHOS 108 (H) 12/06/2016 1156   ALKPHOS  115 08/02/2016 1146   AST 14 (L) 03/07/2019 1007   AST 14 08/02/2016 1146   ALT 10 03/07/2019 1007   ALT 18 12/06/2016 1156   ALT 9 08/02/2016 1146   BILITOT 0.6 03/07/2019 1007   BILITOT 0.41 08/02/2016 1146       IMPRESSION AND PLAN:  L groin crease focal superficial ulceration, likely from recent maceration/heat/moisture. No sign of abscess, cellulitis, or adenopathy. I swabbed the area and sent for culture. Bactroban ointment rx'd, apply tid x 10d. Othewise keep area dry and aerated, avoid friction if possible. Signs/symptoms to call or return for were reviewed and pt expressed understanding.  AVS was printed and given to the patient.  FOLLOW UP: Return if symptoms worsen or fail to improve.  Signed:  Crissie Sickles, MD           06/20/2019

## 2019-06-13 NOTE — Telephone Encounter (Signed)
Transferred pt's wife, Tamela Oddi, to Nurse Triage because pt has had growth in groin area with pus and blood. Per Tamela Oddi, pt is a cancer pt and doesn't know if pt needs to be seen by Dr. Elease Hashimoto or his hematology physician.

## 2019-06-14 ENCOUNTER — Ambulatory Visit: Payer: Medicare Other | Admitting: Family Medicine

## 2019-06-16 LAB — WOUND CULTURE
MICRO NUMBER:: 10544434
SPECIMEN QUALITY:: ADEQUATE

## 2019-08-09 DIAGNOSIS — L812 Freckles: Secondary | ICD-10-CM | POA: Diagnosis not present

## 2019-08-09 DIAGNOSIS — L821 Other seborrheic keratosis: Secondary | ICD-10-CM | POA: Diagnosis not present

## 2019-08-09 DIAGNOSIS — D0439 Carcinoma in situ of skin of other parts of face: Secondary | ICD-10-CM | POA: Diagnosis not present

## 2019-08-09 DIAGNOSIS — D485 Neoplasm of uncertain behavior of skin: Secondary | ICD-10-CM | POA: Diagnosis not present

## 2019-08-09 DIAGNOSIS — L570 Actinic keratosis: Secondary | ICD-10-CM | POA: Diagnosis not present

## 2019-08-13 ENCOUNTER — Other Ambulatory Visit: Payer: Self-pay | Admitting: Hematology & Oncology

## 2019-09-03 ENCOUNTER — Other Ambulatory Visit: Payer: Self-pay

## 2019-09-03 ENCOUNTER — Inpatient Hospital Stay: Payer: Medicare Other | Attending: Hematology & Oncology

## 2019-09-03 DIAGNOSIS — I10 Essential (primary) hypertension: Secondary | ICD-10-CM | POA: Insufficient documentation

## 2019-09-03 DIAGNOSIS — C9000 Multiple myeloma not having achieved remission: Secondary | ICD-10-CM | POA: Insufficient documentation

## 2019-09-03 DIAGNOSIS — Z79899 Other long term (current) drug therapy: Secondary | ICD-10-CM | POA: Insufficient documentation

## 2019-09-04 ENCOUNTER — Inpatient Hospital Stay: Payer: Medicare Other

## 2019-09-04 ENCOUNTER — Inpatient Hospital Stay (HOSPITAL_BASED_OUTPATIENT_CLINIC_OR_DEPARTMENT_OTHER): Payer: Medicare Other | Admitting: Hematology & Oncology

## 2019-09-04 VITALS — BP 118/57 | HR 72 | Temp 98.0°F | Resp 17 | Wt 174.8 lb

## 2019-09-04 DIAGNOSIS — D472 Monoclonal gammopathy: Secondary | ICD-10-CM

## 2019-09-04 DIAGNOSIS — I10 Essential (primary) hypertension: Secondary | ICD-10-CM | POA: Diagnosis not present

## 2019-09-04 DIAGNOSIS — Z79899 Other long term (current) drug therapy: Secondary | ICD-10-CM

## 2019-09-04 DIAGNOSIS — C9000 Multiple myeloma not having achieved remission: Secondary | ICD-10-CM | POA: Diagnosis not present

## 2019-09-04 LAB — CMP (CANCER CENTER ONLY)
ALT: 7 U/L (ref 0–44)
AST: 11 U/L — ABNORMAL LOW (ref 15–41)
Albumin: 4.1 g/dL (ref 3.5–5.0)
Alkaline Phosphatase: 86 U/L (ref 38–126)
Anion gap: 7 (ref 5–15)
BUN: 22 mg/dL (ref 8–23)
CO2: 30 mmol/L (ref 22–32)
Calcium: 9.3 mg/dL (ref 8.9–10.3)
Chloride: 107 mmol/L (ref 98–111)
Creatinine: 1.63 mg/dL — ABNORMAL HIGH (ref 0.61–1.24)
GFR, Est AFR Am: 45 mL/min — ABNORMAL LOW (ref >60)
GFR, Estimated: 39 mL/min — ABNORMAL LOW (ref >60)
Glucose, Bld: 126 mg/dL — ABNORMAL HIGH (ref 70–99)
Potassium: 4.4 mmol/L (ref 3.5–5.1)
Sodium: 144 mmol/L (ref 135–145)
Total Bilirubin: 0.4 mg/dL (ref 0.3–1.2)
Total Protein: 7 g/dL (ref 6.5–8.1)

## 2019-09-04 LAB — CBC WITH DIFFERENTIAL (CANCER CENTER ONLY)
Abs Immature Granulocytes: 0.05 10*3/uL (ref 0.00–0.07)
Basophils Absolute: 0.1 10*3/uL (ref 0.0–0.1)
Basophils Relative: 1 %
Eosinophils Absolute: 0.2 10*3/uL (ref 0.0–0.5)
Eosinophils Relative: 2 %
HCT: 38.1 % — ABNORMAL LOW (ref 39.0–52.0)
Hemoglobin: 12.1 g/dL — ABNORMAL LOW (ref 13.0–17.0)
Immature Granulocytes: 1 %
Lymphocytes Relative: 17 %
Lymphs Abs: 1.5 10*3/uL (ref 0.7–4.0)
MCH: 27.9 pg (ref 26.0–34.0)
MCHC: 31.8 g/dL (ref 30.0–36.0)
MCV: 87.8 fL (ref 80.0–100.0)
Monocytes Absolute: 0.6 10*3/uL (ref 0.1–1.0)
Monocytes Relative: 7 %
Neutro Abs: 6.4 10*3/uL (ref 1.7–7.7)
Neutrophils Relative %: 72 %
Platelet Count: 228 10*3/uL (ref 150–400)
RBC: 4.34 MIL/uL (ref 4.22–5.81)
RDW: 13.4 % (ref 11.5–15.5)
WBC Count: 8.8 10*3/uL (ref 4.0–10.5)
nRBC: 0 % (ref 0.0–0.2)

## 2019-09-04 NOTE — Progress Notes (Signed)
Hematology and Oncology Follow Up Visit  James Marks [id number] November 13, 1940 79 y.o. 09/04/2019   Principle Diagnosis:   IgA kappa smoldering myeloma-normal chromosomes  Current Therapy:    Observation     Interim History:  James Marks is back for follow-up.  He is doing okay.  Overall, he is about the same.  He still has the neuropathy in his feet.  He is on Neurontin to try to help this.  His last 24-hour urine that we did back in February showed a kappa light chain excretion of 267 mg a day.  He has had no problems with fever.  He has had no problems with bleeding.  He has had problems with gout.  He has had continued episodes of gouty flareups.  His last myeloma studies back in February showed a M spike of 0.6 g/dL.  His IgA level was 1000 mg/dL.  He has had no cough or shortness of breath.  He has had no headaches.  His hearing is a little bit down.  He does have a hearing aid.     Overall, I would say performed status is ECOG 1.    Medications:  Current Outpatient Medications:  .  amLODipine (NORVASC) 5 MG tablet, Take 1 tablet (5 mg total) by mouth daily., Disp: 90 tablet, Rfl: 3 .  gabapentin (NEURONTIN) 300 MG capsule, TAKE 1 CAPSULE WITH BREAKFAST AND DINNER, THEN 2 AT BEDTIME., Disp: 360 capsule, Rfl: 4 .  losartan (COZAAR) 50 MG tablet, Take 1 tablet (50 mg total) by mouth daily., Disp: 90 tablet, Rfl: 3 No current facility-administered medications for this visit.  Facility-Administered Medications Ordered in Other Visits:  .  0.9 %  sodium chloride infusion, , Intravenous, Continuous, Abbie Jablon, Rudell Cobb, MD, Stopped at 08/05/14 1710  Allergies: No Known Allergies  Past Medical History, Surgical history, Social history, and Family History were reviewed and updated.  Review of Systems: Review of Systems  Constitutional: Positive for malaise/fatigue.  HENT: Positive for congestion.   Eyes: Negative.   Respiratory: Positive for shortness of breath.    Cardiovascular: Positive for palpitations.  Gastrointestinal: Negative.   Genitourinary: Negative.   Musculoskeletal: Positive for back pain and myalgias.  Skin: Negative.   Neurological: Positive for tingling.  Endo/Heme/Allergies: Negative.   Psychiatric/Behavioral: Negative.     Physical Exam:  weight is 174 lb 12 oz (79.3 kg). His oral temperature is 98 F (36.7 C). His blood pressure is 118/57 (abnormal) and his pulse is 72. His respiration is 17 and oxygen saturation is 98%.   Wt Readings from Last 3 Encounters:  09/04/19 174 lb 12 oz (79.3 kg)  06/13/19 175 lb 12.8 oz (79.7 kg)  05/29/19 176 lb 14.4 oz (80.2 kg)     Physical Exam Vitals reviewed.  HENT:     Head: Normocephalic and atraumatic.  Eyes:     Pupils: Pupils are equal, round, and reactive to light.  Cardiovascular:     Rate and Rhythm: Normal rate and regular rhythm.     Heart sounds: Normal heart sounds.  Pulmonary:     Effort: Pulmonary effort is normal.     Breath sounds: Normal breath sounds.  Abdominal:     General: Bowel sounds are normal.     Palpations: Abdomen is soft.  Musculoskeletal:        General: No tenderness or deformity. Normal range of motion.     Cervical back: Normal range of motion.  Lymphadenopathy:     Cervical: No cervical  adenopathy.  Skin:    General: Skin is warm and dry.     Findings: No erythema or rash.  Neurological:     Mental Status: He is alert and oriented to person, place, and time.  Psychiatric:        Behavior: Behavior normal.        Thought Content: Thought content normal.        Judgment: Judgment normal.      Lab Results  Component Value Date   WBC 8.8 09/04/2019   HGB 12.1 (L) 09/04/2019   HCT 38.1 (L) 09/04/2019   MCV 87.8 09/04/2019   PLT 228 09/04/2019     Chemistry      Component Value Date/Time   NA 144 09/04/2019 1428   NA 143 12/06/2016 1156   NA 139 08/02/2016 1146   K 4.4 09/04/2019 1428   K 4.2 12/06/2016 1156   K 4.7  08/02/2016 1146   CL 107 09/04/2019 1428   CL 107 12/06/2016 1156   CO2 30 09/04/2019 1428   CO2 28 12/06/2016 1156   CO2 24 08/02/2016 1146   BUN 22 09/04/2019 1428   BUN 29 (H) 12/06/2016 1156   BUN 34.3 (H) 08/02/2016 1146   CREATININE 1.63 (H) 09/04/2019 1428   CREATININE 2.1 (H) 12/06/2016 1156   CREATININE 1.7 (H) 08/02/2016 1146      Component Value Date/Time   CALCIUM 9.3 09/04/2019 1428   CALCIUM 9.1 12/06/2016 1156   CALCIUM 9.5 08/02/2016 1146   ALKPHOS 86 09/04/2019 1428   ALKPHOS 108 (H) 12/06/2016 1156   ALKPHOS 115 08/02/2016 1146   AST 11 (L) 09/04/2019 1428   AST 14 08/02/2016 1146   ALT 7 09/04/2019 1428   ALT 18 12/06/2016 1156   ALT 9 08/02/2016 1146   BILITOT 0.4 09/04/2019 1428   BILITOT 0.41 08/02/2016 1146         Impression and Plan: James Marks is 79 year old man with IgA kappa smoldering myeloma.   We will have to see what his M spike is this time.  So far, we be very conservative with respect to having to treat him.  He really has had no symptoms.  I still do not think that this neuropathy is reflective of myeloma.  I will plan for another follow-up in 6 more months.      [name], MD 8/24/20213:37 PM

## 2019-09-05 ENCOUNTER — Inpatient Hospital Stay: Payer: Medicare Other | Admitting: Hematology & Oncology

## 2019-09-05 ENCOUNTER — Inpatient Hospital Stay: Payer: Medicare Other

## 2019-09-05 ENCOUNTER — Telehealth: Payer: Self-pay | Admitting: Hematology & Oncology

## 2019-09-05 LAB — UPEP/UIFE/LIGHT CHAINS/TP, 24-HR UR
% BETA, Urine: 0 %
ALPHA 1 URINE: 0 %
Albumin, U: 100 %
Alpha 2, Urine: 0 %
Free Kappa Lt Chains,Ur: 95.79 mg/L (ref 0.63–113.79)
Free Kappa/Lambda Ratio: 24.01 (ref 1.03–31.76)
Free Lambda Lt Chains,Ur: 3.99 mg/L (ref 0.47–11.77)
GAMMA GLOBULIN URINE: 0 %
Total Protein, Urine-Ur/day: 95 mg/24 hr (ref 30–150)
Total Protein, Urine: 4.5 mg/dL
Total Volume: 2100

## 2019-09-05 LAB — KAPPA/LAMBDA LIGHT CHAINS
Kappa free light chain: 100.8 mg/L — ABNORMAL HIGH (ref 3.3–19.4)
Kappa, lambda light chain ratio: 4.31 — ABNORMAL HIGH (ref 0.26–1.65)
Lambda free light chains: 23.4 mg/L (ref 5.7–26.3)

## 2019-09-05 LAB — IGG, IGA, IGM
IgA: 886 mg/dL — ABNORMAL HIGH (ref 61–437)
IgG (Immunoglobin G), Serum: 838 mg/dL (ref 603–1613)
IgM (Immunoglobulin M), Srm: 56 mg/dL (ref 15–143)

## 2019-09-05 NOTE — Telephone Encounter (Signed)
Appointments scheduled calendar printed & mailed per 8/24 los

## 2019-09-06 ENCOUNTER — Encounter: Payer: Self-pay | Admitting: *Deleted

## 2019-09-07 LAB — IMMUNOFIXATION REFLEX, SERUM
IgA: 937 mg/dL — ABNORMAL HIGH (ref 61–437)
IgG (Immunoglobin G), Serum: 894 mg/dL (ref 603–1613)
IgM (Immunoglobulin M), Srm: 63 mg/dL (ref 15–143)

## 2019-09-07 LAB — PROTEIN ELECTROPHORESIS, SERUM, WITH REFLEX
A/G Ratio: 1.1 (ref 0.7–1.7)
Albumin ELP: 3.6 g/dL (ref 2.9–4.4)
Alpha-1-Globulin: 0.2 g/dL (ref 0.0–0.4)
Alpha-2-Globulin: 0.7 g/dL (ref 0.4–1.0)
Beta Globulin: 1.3 g/dL (ref 0.7–1.3)
Gamma Globulin: 1 g/dL (ref 0.4–1.8)
Globulin, Total: 3.2 g/dL (ref 2.2–3.9)
M-Spike, %: 0.5 g/dL — ABNORMAL HIGH
SPEP Interpretation: 0
Total Protein ELP: 6.8 g/dL (ref 6.0–8.5)

## 2019-09-10 ENCOUNTER — Telehealth: Payer: Self-pay

## 2019-09-10 NOTE — Telephone Encounter (Signed)
-----   Message from [name], MD sent at 09/08/2019  6:58 AM EDT ----- Call - there abnormal protein is stable at 0.5!!  This is great news!!  James Marks

## 2019-09-10 NOTE — Telephone Encounter (Signed)
Called and informed patients wife, Tamela Oddi of lab results per Dr.Ennever. Doris was very Teacher, music of call and denies any further questions or concerns.

## 2019-09-18 ENCOUNTER — Ambulatory Visit: Payer: Medicare Other | Attending: Internal Medicine

## 2019-09-18 DIAGNOSIS — Z23 Encounter for immunization: Secondary | ICD-10-CM

## 2019-09-18 NOTE — Progress Notes (Signed)
   Covid-19 Vaccination Clinic  Name:  James Marks    MRN: [id number] DOB: 1940-04-29  09/18/2019  James Marks was observed post Covid-19 immunization for 15 minutes without incident. He was provided with Vaccine Information Sheet and instruction to access the V-Safe system.   James Marks was instructed to call 911 with any severe reactions post vaccine: Marland Kitchen Difficulty breathing  . Swelling of face and throat  . A fast heartbeat  . A bad rash all over body  . Dizziness and weakness

## 2019-10-29 ENCOUNTER — Other Ambulatory Visit: Payer: Self-pay | Admitting: *Deleted

## 2019-10-29 ENCOUNTER — Telehealth: Payer: Self-pay | Admitting: *Deleted

## 2019-10-29 MED ORDER — AMOXICILLIN 500 MG PO CAPS: ORAL_CAPSULE | ORAL | 0 refills | Status: DC

## 2019-10-29 NOTE — Telephone Encounter (Signed)
Message received from patient's wife wanting to know if pt should take an antibiotic which would be ordered by his dentist prior to having a tooth removed and also prior to having his teeth cleaned. Jory Ee NP notified.  Call placed back to patient's wife and patient's wife notified per order of S. [place] NP that it is ok for pt to take antibiotic prior to dental procedures.  Pt.'s wife is appreciative of assistance and has no further questions at this time.

## 2019-10-29 NOTE — Telephone Encounter (Signed)
Call received from patient's wife stating that pt.'s dentist would like for this office to call the first prescription in for an antibiotic prior to pt.'s dental procedure and patient's dentist will handle refills for further procedures.  Jory Ee NP notified and prescription sent per order of S. [place] NP.

## 2019-11-02 ENCOUNTER — Ambulatory Visit (INDEPENDENT_AMBULATORY_CARE_PROVIDER_SITE_OTHER): Payer: Medicare Other | Admitting: Family Medicine

## 2019-11-02 ENCOUNTER — Encounter: Payer: Self-pay | Admitting: Family Medicine

## 2019-11-02 ENCOUNTER — Other Ambulatory Visit: Payer: Self-pay

## 2019-11-02 VITALS — BP 132/80 | HR 53 | Temp 97.6°F | Ht 74.0 in | Wt 173.2 lb

## 2019-11-02 DIAGNOSIS — I10 Essential (primary) hypertension: Secondary | ICD-10-CM | POA: Diagnosis not present

## 2019-11-02 DIAGNOSIS — Z23 Encounter for immunization: Secondary | ICD-10-CM

## 2019-11-02 DIAGNOSIS — G6289 Other specified polyneuropathies: Secondary | ICD-10-CM

## 2019-11-02 DIAGNOSIS — N1832 Chronic kidney disease, stage 3b: Secondary | ICD-10-CM | POA: Diagnosis not present

## 2019-11-02 DIAGNOSIS — G629 Polyneuropathy, unspecified: Secondary | ICD-10-CM | POA: Insufficient documentation

## 2019-11-02 MED ORDER — LOSARTAN POTASSIUM 50 MG PO TABS
50.0000 mg | ORAL_TABLET | Freq: Every day | ORAL | 3 refills | Status: DC
Start: 2019-11-02 — End: 2019-11-19

## 2019-11-02 NOTE — Progress Notes (Signed)
Established Patient Office Visit  Subjective:  Patient ID: James Marks, male    DOB: [birth year]  Age: 79 y.o. MRN: [id number]  CC:  Chief Complaint  Patient presents with  . Annual Exam    HPI James Marks presents for medical follow-up.  He has history of hypertension, peripheral neuropathy, chronic kidney disease, multiple myeloma, hyperlipidemia, gout.  No recent gout flareups.  He is followed closely by oncology.  His myeloma has been stable.  He continues to have neuropathy issues and takes gabapentin 3 times daily.  Still has some night pain but overall coping fairly well.  We added losartan to his amlodipine for blood pressure last year.  Blood pressures been stable.  No recent dizziness.  No headaches.  No chest pains.  Compliant with medications.  He does have chronic kidney disease stage III.  He is getting lab work every 6 months through oncology.  He had one recent fall on the golf course.  He stepped in a hole.  He does have some balance issues with his neuropathy but overall relatively stable.  Stays very active with yard work and golf.  Vaccines up-to-date with exception of no history of shingles vaccine.  Also needs flu vaccine today  Past Medical History:  Diagnosis Date  . Cancer ([place])    myeloma  . Diverticulitis   . Hearing loss   . Hx of colonic polyps   . Hyperlipidemia   . Hypertension   . Kidney stones   . Osteopenia   . Smoldering multiple myeloma ([place]) 08/26/2014  . Subarachnoid hemorrhage ([place]) 2/01   nontraumatic    Past Surgical History:  Procedure Laterality Date  . None      Family History  Problem Relation Age of Onset  . Cancer Mother        breast  . Diabetes Mother   . Hypertension Father   . Diabetes Other        parent  . Macular degeneration Brother   . Diabetes Mellitus II Brother     Social History   Socioeconomic History  . Marital status: Married    Spouse name: Not on file  . Number of children: Not on file    . Years of education: Not on file  . Highest education level: Not on file  Occupational History  . Not on file  Tobacco Use  . Smoking status: Former Smoker    Packs/day: 0.80    Years: 55.00    Pack years: 44.00    Types: Cigarettes    Start date: 10/05/1952    Quit date: 04/04/2012    Years since quitting: 7.5  . Smokeless tobacco: Former Systems developer    Quit date: 10/05/2012  . Tobacco comment: quit 2 years ago  Vaping Use  . Vaping Use: Never used  Substance and Sexual Activity  . Alcohol use: Yes    Alcohol/week: 0.0 standard drinks    Comment: Rare, once every three months  . Drug use: No  . Sexual activity: Not on file  Other Topics Concern  . Not on file  Social History Narrative   Lives with wife in a 2 story home.  Has 1 child.  Retired from [employer].  Education: 12th grade.   Social Determinants of Health   Financial Resource Strain:   . Difficulty of Paying Living Expenses: Not on file  Food Insecurity:   . Worried About Charity fundraiser in the Last Year: Not on file  .  Ran Out of Food in the Last Year: Not on file  Transportation Needs:   . Lack of Transportation (Medical): Not on file  . Lack of Transportation (Non-Medical): Not on file  Physical Activity:   . Days of Exercise per Week: Not on file  . Minutes of Exercise per Session: Not on file  Stress:   . Feeling of Stress : Not on file  Social Connections:   . Frequency of Communication with Friends and Family: Not on file  . Frequency of Social Gatherings with Friends and Family: Not on file  . Attends Religious Services: Not on file  . Active Member of Clubs or Organizations: Not on file  . Attends Archivist Meetings: Not on file  . Marital Status: Not on file  Intimate Partner Violence:   . Fear of Current or Ex-Partner: Not on file  . Emotionally Abused: Not on file  . Physically Abused: Not on file  . Sexually Abused: Not on file    Outpatient Medications Prior to Visit   Medication Sig Dispense Refill  . amLODipine (NORVASC) 5 MG tablet Take 1 tablet (5 mg total) by mouth daily. 90 tablet 3  . gabapentin (NEURONTIN) 300 MG capsule TAKE 1 CAPSULE WITH BREAKFAST AND DINNER, THEN 2 AT BEDTIME. 360 capsule 4  . losartan (COZAAR) 50 MG tablet Take 1 tablet (50 mg total) by mouth daily. 90 tablet 3  . amoxicillin (AMOXIL) 500 MG capsule Take four tablets (2000 mg) total one hour prior to dental procedure. (Patient not taking: Reported on 11/02/2019) 4 capsule 0   Facility-Administered Medications Prior to Visit  Medication Dose Route Frequency Provider Last Rate Last Admin  . 0.9 %  sodium chloride infusion   Intravenous Continuous [name], MD   Stopped at 08/05/14 1710    No Known Allergies  ROS Review of Systems  Constitutional: Negative for fatigue and unexpected weight change.  Eyes: Negative for visual disturbance.  Respiratory: Negative for cough, chest tightness and shortness of breath.   Cardiovascular: Negative for chest pain, palpitations and leg swelling.  Endocrine: Negative for polydipsia and polyuria.  Genitourinary: Negative for dysuria.  Neurological: Negative for dizziness, syncope, weakness, light-headedness and headaches.      Objective:    Physical Exam Constitutional:      Appearance: He is well-developed.  HENT:     Right Ear: External ear normal.     Left Ear: External ear normal.  Eyes:     Pupils: Pupils are equal, round, and reactive to light.  Neck:     Thyroid: No thyromegaly.  Cardiovascular:     Rate and Rhythm: Normal rate and regular rhythm.  Pulmonary:     Effort: Pulmonary effort is normal. No respiratory distress.     Breath sounds: Normal breath sounds. No wheezing or rales.  Musculoskeletal:     Cervical back: Neck supple.     Right lower leg: No edema.     Left lower leg: No edema.  Neurological:     Mental Status: He is alert and oriented to person, place, and time.     BP 132/80 (BP  Location: Left Arm, Patient Position: Sitting, Cuff Size: Normal)   Pulse (!) 53   Temp 97.6 F (36.4 C)   Ht $R'6\' 2"'LV$  (1.88 m)   Wt 173 lb 3.2 oz (78.6 kg)   SpO2 96%   BMI 22.24 kg/m  Wt Readings from Last 3 Encounters:  11/02/19 173 lb 3.2 oz (78.6  kg)  09/04/19 174 lb 12 oz (79.3 kg)  06/13/19 175 lb 12.8 oz (79.7 kg)     There are no preventive care reminders to display for this patient.  There are no preventive care reminders to display for this patient.  Lab Results  Component Value Date   TSH 0.763 03/29/2014   Lab Results  Component Value Date   WBC 8.8 09/04/2019   HGB 12.1 (L) 09/04/2019   HCT 38.1 (L) 09/04/2019   MCV 87.8 09/04/2019   PLT 228 09/04/2019   Lab Results  Component Value Date   NA 144 09/04/2019   K 4.4 09/04/2019   CHLORIDE 107 08/02/2016   CO2 30 09/04/2019   GLUCOSE 126 (H) 09/04/2019   BUN 22 09/04/2019   CREATININE 1.63 (H) 09/04/2019   BILITOT 0.4 09/04/2019   ALKPHOS 86 09/04/2019   AST 11 (L) 09/04/2019   ALT 7 09/04/2019   PROT 7.0 09/04/2019   ALBUMIN 4.1 09/04/2019   CALCIUM 9.3 09/04/2019   ANIONGAP 7 09/04/2019   EGFR 37 (L) 08/02/2016   GFR 39.58 (L) 10/05/2013   Lab Results  Component Value Date   CHOL 153 10/04/2012   Lab Results  Component Value Date   HDL 39.10 10/04/2012   Lab Results  Component Value Date   LDLCALC 97 10/04/2012   Lab Results  Component Value Date   TRIG 85.0 10/04/2012   Lab Results  Component Value Date   CHOLHDL 4 10/04/2012   Lab Results  Component Value Date   HGBA1C 6.0 (H) 03/29/2014      Assessment & Plan:   Problem List Items Addressed This Visit      Unprioritized   Peripheral neuropathy   Essential hypertension   Relevant Medications   losartan (COZAAR) 50 MG tablet   CKD (chronic kidney disease) stage 3, GFR 30-59 ml/min (HCC)    Other Visit Diagnoses    Need for influenza vaccination    -  Primary   Relevant Orders   Flu Vaccine QUAD High Dose(Fluad)  (Completed)    -Refill losartan for 1 year -Flu vaccine given -Did not obtain any labs today as he is getting regular extensive labs through oncology every 6 months -We did discuss Shingrix vaccine and he will check at pharmacy. -Handout given on fall prevention in the home -We discussed that other medications could be potentially added to his gabapentin if his neuropathy pain became worse at night.  At this point he is okay to observe.  Meds ordered this encounter  Medications  . losartan (COZAAR) 50 MG tablet    Sig: Take 1 tablet (50 mg total) by mouth daily.    Dispense:  90 tablet    Refill:  3    Follow-up: No follow-ups on file.    Carolann Littler, MD

## 2019-11-02 NOTE — Patient Instructions (Signed)

## 2019-11-17 ENCOUNTER — Other Ambulatory Visit: Payer: Self-pay | Admitting: Family Medicine

## 2019-12-31 DIAGNOSIS — H25813 Combined forms of age-related cataract, bilateral: Secondary | ICD-10-CM | POA: Diagnosis not present

## 2020-02-12 DIAGNOSIS — H2513 Age-related nuclear cataract, bilateral: Secondary | ICD-10-CM | POA: Diagnosis not present

## 2020-02-12 DIAGNOSIS — H18413 Arcus senilis, bilateral: Secondary | ICD-10-CM | POA: Diagnosis not present

## 2020-02-12 DIAGNOSIS — H25013 Cortical age-related cataract, bilateral: Secondary | ICD-10-CM | POA: Diagnosis not present

## 2020-02-12 DIAGNOSIS — H2512 Age-related nuclear cataract, left eye: Secondary | ICD-10-CM | POA: Diagnosis not present

## 2020-02-12 DIAGNOSIS — H34812 Central retinal vein occlusion, left eye, with macular edema: Secondary | ICD-10-CM | POA: Diagnosis not present

## 2020-02-12 DIAGNOSIS — H25043 Posterior subcapsular polar age-related cataract, bilateral: Secondary | ICD-10-CM | POA: Diagnosis not present

## 2020-02-13 DIAGNOSIS — D485 Neoplasm of uncertain behavior of skin: Secondary | ICD-10-CM | POA: Diagnosis not present

## 2020-02-13 DIAGNOSIS — D0439 Carcinoma in situ of skin of other parts of face: Secondary | ICD-10-CM | POA: Diagnosis not present

## 2020-02-13 DIAGNOSIS — Z85828 Personal history of other malignant neoplasm of skin: Secondary | ICD-10-CM | POA: Diagnosis not present

## 2020-02-13 DIAGNOSIS — L820 Inflamed seborrheic keratosis: Secondary | ICD-10-CM | POA: Diagnosis not present

## 2020-02-15 DIAGNOSIS — H34812 Central retinal vein occlusion, left eye, with macular edema: Secondary | ICD-10-CM | POA: Diagnosis not present

## 2020-02-15 DIAGNOSIS — H353132 Nonexudative age-related macular degeneration, bilateral, intermediate dry stage: Secondary | ICD-10-CM | POA: Diagnosis not present

## 2020-02-15 DIAGNOSIS — H43811 Vitreous degeneration, right eye: Secondary | ICD-10-CM | POA: Diagnosis not present

## 2020-02-15 DIAGNOSIS — H47332 Pseudopapilledema of optic disc, left eye: Secondary | ICD-10-CM | POA: Diagnosis not present

## 2020-02-22 ENCOUNTER — Other Ambulatory Visit: Payer: Self-pay

## 2020-02-22 MED ORDER — LOSARTAN POTASSIUM 50 MG PO TABS
50.0000 mg | ORAL_TABLET | Freq: Every day | ORAL | 3 refills | Status: DC
Start: 2020-02-22 — End: 2020-12-16

## 2020-02-22 MED ORDER — AMLODIPINE BESYLATE 5 MG PO TABS
5.0000 mg | ORAL_TABLET | Freq: Every day | ORAL | 3 refills | Status: DC
Start: 2020-02-22 — End: 2020-12-16

## 2020-03-04 ENCOUNTER — Inpatient Hospital Stay: Payer: Medicare Other | Attending: Hematology & Oncology

## 2020-03-04 DIAGNOSIS — I10 Essential (primary) hypertension: Secondary | ICD-10-CM | POA: Insufficient documentation

## 2020-03-04 DIAGNOSIS — Z79899 Other long term (current) drug therapy: Secondary | ICD-10-CM | POA: Insufficient documentation

## 2020-03-04 DIAGNOSIS — C9000 Multiple myeloma not having achieved remission: Secondary | ICD-10-CM | POA: Insufficient documentation

## 2020-03-06 ENCOUNTER — Other Ambulatory Visit: Payer: Self-pay | Admitting: Oncology

## 2020-03-06 ENCOUNTER — Inpatient Hospital Stay (HOSPITAL_BASED_OUTPATIENT_CLINIC_OR_DEPARTMENT_OTHER): Payer: Medicare Other | Admitting: Hematology & Oncology

## 2020-03-06 ENCOUNTER — Encounter: Payer: Self-pay | Admitting: Hematology & Oncology

## 2020-03-06 ENCOUNTER — Inpatient Hospital Stay: Payer: Medicare Other

## 2020-03-06 ENCOUNTER — Other Ambulatory Visit: Payer: Self-pay

## 2020-03-06 VITALS — BP 133/61 | HR 53 | Temp 98.8°F | Resp 20 | Wt 174.4 lb

## 2020-03-06 DIAGNOSIS — C9000 Multiple myeloma not having achieved remission: Secondary | ICD-10-CM

## 2020-03-06 DIAGNOSIS — Z79899 Other long term (current) drug therapy: Secondary | ICD-10-CM | POA: Diagnosis not present

## 2020-03-06 DIAGNOSIS — D472 Monoclonal gammopathy: Secondary | ICD-10-CM

## 2020-03-06 DIAGNOSIS — I10 Essential (primary) hypertension: Secondary | ICD-10-CM | POA: Diagnosis not present

## 2020-03-06 LAB — CMP (CANCER CENTER ONLY)
ALT: 7 U/L (ref 0–44)
AST: 11 U/L — ABNORMAL LOW (ref 15–41)
Albumin: 4.1 g/dL (ref 3.5–5.0)
Alkaline Phosphatase: 95 U/L (ref 38–126)
Anion gap: 6 (ref 5–15)
BUN: 25 mg/dL — ABNORMAL HIGH (ref 8–23)
CO2: 30 mmol/L (ref 22–32)
Calcium: 9.7 mg/dL (ref 8.9–10.3)
Chloride: 106 mmol/L (ref 98–111)
Creatinine: 1.57 mg/dL — ABNORMAL HIGH (ref 0.61–1.24)
GFR, Estimated: 44 mL/min — ABNORMAL LOW (ref >60)
Glucose, Bld: 97 mg/dL (ref 70–99)
Potassium: 4.5 mmol/L (ref 3.5–5.1)
Sodium: 142 mmol/L (ref 135–145)
Total Bilirubin: 0.4 mg/dL (ref 0.3–1.2)
Total Protein: 7.2 g/dL (ref 6.5–8.1)

## 2020-03-06 LAB — CBC WITH DIFFERENTIAL (CANCER CENTER ONLY)
Abs Immature Granulocytes: 0.02 10*3/uL (ref 0.00–0.07)
Basophils Absolute: 0 10*3/uL (ref 0.0–0.1)
Basophils Relative: 1 %
Eosinophils Absolute: 0.1 10*3/uL (ref 0.0–0.5)
Eosinophils Relative: 1 %
HCT: 39.7 % (ref 39.0–52.0)
Hemoglobin: 12.6 g/dL — ABNORMAL LOW (ref 13.0–17.0)
Immature Granulocytes: 0 %
Lymphocytes Relative: 24 %
Lymphs Abs: 1.8 10*3/uL (ref 0.7–4.0)
MCH: 27.8 pg (ref 26.0–34.0)
MCHC: 31.7 g/dL (ref 30.0–36.0)
MCV: 87.6 fL (ref 80.0–100.0)
Monocytes Absolute: 0.6 10*3/uL (ref 0.1–1.0)
Monocytes Relative: 8 %
Neutro Abs: 5.1 10*3/uL (ref 1.7–7.7)
Neutrophils Relative %: 66 %
Platelet Count: 228 10*3/uL (ref 150–400)
RBC: 4.53 MIL/uL (ref 4.22–5.81)
RDW: 13.6 % (ref 11.5–15.5)
WBC Count: 7.7 10*3/uL (ref 4.0–10.5)
nRBC: 0 % (ref 0.0–0.2)

## 2020-03-06 MED ORDER — GABAPENTIN 300 MG PO CAPS: ORAL_CAPSULE | ORAL | 4 refills | Status: DC

## 2020-03-06 NOTE — Progress Notes (Signed)
Hematology and Oncology Follow Up Visit  James Marks [id number] 02/22/1940 80 y.o. 03/06/2020   Principle Diagnosis:   IgA kappa smoldering myeloma-normal chromosomes  Current Therapy:    Observation     Interim History:  James Marks is back for follow-up.  Unfortunately, the problem that he now has is that he has a central retinal vein occlusion in the left eye.  This was found about a month or so ago.  He has been seen by ophthalmology.  He also has what looks like macular degeneration.  He is getting shots into the eye.  I had believe these are VEGR inhibitors.  I do not think this has anything to do with him having the smoldering myeloma.  Only last saw him, James Marks was 0.5 g/dL.  James IgA level was 860 mg/dL.  The Kappa light chain was 10 mg/dL.  We did do a 24-hour urine on him back in August when we saw him.  James Kappa light chain excretion was 96 mg/L.  He is not able to play golf because of the eye issue.  Otherwise, he seems to be holding James own.  He is eating okay.  He had a nice holiday season.  He has had no fever.  He has had no problems with Covid.  There is been no rashes.  He has had no nausea or vomiting.  He has had no leg swelling.  Overall, James performance status is ECOG 1.    Medications:  Current Outpatient Medications:  .  amLODipine (NORVASC) 5 MG tablet, Take 1 tablet (5 mg total) by mouth daily., Disp: 90 tablet, Rfl: 3 .  losartan (COZAAR) 50 MG tablet, Take 1 tablet (50 mg total) by mouth daily., Disp: 90 tablet, Rfl: 3 .  amoxicillin (AMOXIL) 500 MG capsule, Take four tablets (2000 mg) total one hour prior to dental procedure. (Patient not taking: No sig reported), Disp: 4 capsule, Rfl: 0 .  gabapentin (NEURONTIN) 300 MG capsule, One capsule with breakfast and dinner and two at bedtime., Disp: 360 capsule, Rfl: 4 No current facility-administered medications for this visit.  Facility-Administered Medications Ordered in Other Visits:  .   0.9 %  sodium chloride infusion, , Intravenous, Continuous, Simar Pothier, Rudell Cobb, MD, Stopped at 08/05/14 1710  Allergies: No Known Allergies  Past Medical History, Surgical history, Social history, and Family History were reviewed and updated.  Review of Systems: Review of Systems  Constitutional: Positive for malaise/fatigue.  HENT: Positive for congestion.   Eyes: Negative.   Respiratory: Positive for shortness of breath.   Cardiovascular: Positive for palpitations.  Gastrointestinal: Negative.   Genitourinary: Negative.   Musculoskeletal: Positive for back pain and myalgias.  Skin: Negative.   Neurological: Positive for tingling.  Endo/Heme/Allergies: Negative.   Psychiatric/Behavioral: Negative.     Physical Exam:  weight is 174 lb 6.4 oz (79.1 kg). James temperature is 98.8 F (37.1 C). James blood pressure is 133/61 and James pulse is 53 (abnormal). James respiration is 20 and oxygen saturation is 100%.   Wt Readings from Last 3 Encounters:  03/06/20 174 lb 6.4 oz (79.1 kg)  11/02/19 173 lb 3.2 oz (78.6 kg)  09/04/19 174 lb 12 oz (79.3 kg)     Physical Exam Vitals reviewed.  HENT:     Head: Normocephalic and atraumatic.  Eyes:     Pupils: Pupils are equal, round, and reactive to light.  Cardiovascular:     Rate and Rhythm: Normal rate and regular rhythm.  Heart sounds: Normal heart sounds.  Pulmonary:     Effort: Pulmonary effort is normal.     Breath sounds: Normal breath sounds.  Abdominal:     General: Bowel sounds are normal.     Palpations: Abdomen is soft.  Musculoskeletal:        General: No tenderness or deformity. Normal range of motion.     Cervical back: Normal range of motion.  Lymphadenopathy:     Cervical: No cervical adenopathy.  Skin:    General: Skin is warm and dry.     Findings: No erythema or rash.  Neurological:     Mental Status: He is alert and oriented to person, place, and time.  Psychiatric:        Behavior: Behavior normal.         Thought Content: Thought content normal.        Judgment: Judgment normal.      Lab Results  Component Value Date   WBC 7.7 03/06/2020   HGB 12.6 (L) 03/06/2020   HCT 39.7 03/06/2020   MCV 87.6 03/06/2020   PLT 228 03/06/2020     Chemistry      Component Value Date/Time   NA 142 03/06/2020 1438   NA 143 12/06/2016 1156   NA 139 08/02/2016 1146   K 4.5 03/06/2020 1438   K 4.2 12/06/2016 1156   K 4.7 08/02/2016 1146   CL 106 03/06/2020 1438   CL 107 12/06/2016 1156   CO2 30 03/06/2020 1438   CO2 28 12/06/2016 1156   CO2 24 08/02/2016 1146   BUN 25 (H) 03/06/2020 1438   BUN 29 (H) 12/06/2016 1156   BUN 34.3 (H) 08/02/2016 1146   CREATININE 1.57 (H) 03/06/2020 1438   CREATININE 2.1 (H) 12/06/2016 1156   CREATININE 1.7 (H) 08/02/2016 1146      Component Value Date/Time   CALCIUM 9.7 03/06/2020 1438   CALCIUM 9.1 12/06/2016 1156   CALCIUM 9.5 08/02/2016 1146   ALKPHOS 95 03/06/2020 1438   ALKPHOS 108 (H) 12/06/2016 1156   ALKPHOS 115 08/02/2016 1146   AST 11 (L) 03/06/2020 1438   AST 14 08/02/2016 1146   ALT 7 03/06/2020 1438   ALT 18 12/06/2016 1156   ALT 9 08/02/2016 1146   BILITOT 0.4 03/06/2020 1438   BILITOT 0.41 08/02/2016 1146      Impression and Plan: [name] is 80 year old man with IgA kappa smoldering myeloma.  Overall, this is been holding steady.  I feel bad that he has this central retinal vein occlusion in the left eye.  He has some macular degeneration.  This is certainly affecting James quality of life much more so than any issue with the smoldering myeloma.  I would like to get him back in 4 months now.  I just want to make sure we follow him a little more closely given the issues with the eye.       [name], MD 2/24/20223:36 PM

## 2020-03-07 LAB — IGG, IGA, IGM
IgA: 1044 mg/dL — ABNORMAL HIGH (ref 61–437)
IgG (Immunoglobin G), Serum: 989 mg/dL (ref 603–1613)
IgM (Immunoglobulin M), Srm: 67 mg/dL (ref 15–143)

## 2020-03-07 LAB — KAPPA/LAMBDA LIGHT CHAINS
Kappa free light chain: 87.5 mg/L — ABNORMAL HIGH (ref 3.3–19.4)
Kappa, lambda light chain ratio: 3.53 — ABNORMAL HIGH (ref 0.26–1.65)
Lambda free light chains: 24.8 mg/L (ref 5.7–26.3)

## 2020-03-10 LAB — UPEP/UIFE/LIGHT CHAINS/TP, 24-HR UR
% BETA, Urine: 29.3 %
ALPHA 1 URINE: 2.9 %
Albumin, U: 42.7 %
Alpha 2, Urine: 6.5 %
Free Kappa Lt Chains,Ur: 82.71 mg/L (ref 0.63–113.79)
Free Kappa/Lambda Ratio: 21.26 (ref 1.03–31.76)
Free Lambda Lt Chains,Ur: 3.89 mg/L (ref 0.47–11.77)
GAMMA GLOBULIN URINE: 18.6 %
M-SPIKE %, Urine: 13.2 % — ABNORMAL HIGH
M-Spike, Mg/24 Hr: 21 mg/24 hr — ABNORMAL HIGH
Total Protein, Urine-Ur/day: 156 mg/24 hr — ABNORMAL HIGH (ref 30–150)
Total Protein, Urine: 5.2 mg/dL
Total Volume: 3000

## 2020-03-11 ENCOUNTER — Encounter: Payer: Self-pay | Admitting: *Deleted

## 2020-03-11 ENCOUNTER — Telehealth: Payer: Self-pay | Admitting: *Deleted

## 2020-03-11 LAB — IMMUNOFIXATION REFLEX, SERUM
IgA: 1040 mg/dL — ABNORMAL HIGH (ref 61–437)
IgG (Immunoglobin G), Serum: 1038 mg/dL (ref 603–1613)
IgM (Immunoglobulin M), Srm: 70 mg/dL (ref 15–143)

## 2020-03-11 LAB — PROTEIN ELECTROPHORESIS, SERUM, WITH REFLEX
A/G Ratio: 1.2 (ref 0.7–1.7)
Albumin ELP: 3.7 g/dL (ref 2.9–4.4)
Alpha-1-Globulin: 0.2 g/dL (ref 0.0–0.4)
Alpha-2-Globulin: 0.6 g/dL (ref 0.4–1.0)
Beta Globulin: 1.4 g/dL — ABNORMAL HIGH (ref 0.7–1.3)
Gamma Globulin: 1 g/dL (ref 0.4–1.8)
Globulin, Total: 3.2 g/dL (ref 2.2–3.9)
M-Spike, %: 0.7 g/dL — ABNORMAL HIGH
SPEP Interpretation: 0
Total Protein ELP: 6.9 g/dL (ref 6.0–8.5)

## 2020-03-11 NOTE — Telephone Encounter (Signed)
Pt.'s wife notified per order of Dr. Marin Olp that "the protein in the urine is down to 83!!  Was 62!!  [employer] job!! Film/video editor"  Pt.'s wife appreciative of call and has no questions or concerns at this time.

## 2020-03-11 NOTE — Telephone Encounter (Signed)
-----   Message from [name], MD sent at 03/11/2020  6:11 AM EST ----- Call - the protein in the urine is down to 83!!  Was 1!!  [employer]!!  Laurey Arrow

## 2020-03-12 ENCOUNTER — Encounter: Payer: Self-pay | Admitting: *Deleted

## 2020-03-17 DIAGNOSIS — H34812 Central retinal vein occlusion, left eye, with macular edema: Secondary | ICD-10-CM | POA: Diagnosis not present

## 2020-03-17 DIAGNOSIS — H353132 Nonexudative age-related macular degeneration, bilateral, intermediate dry stage: Secondary | ICD-10-CM | POA: Diagnosis not present

## 2020-04-14 DIAGNOSIS — H353132 Nonexudative age-related macular degeneration, bilateral, intermediate dry stage: Secondary | ICD-10-CM | POA: Diagnosis not present

## 2020-04-14 DIAGNOSIS — H34812 Central retinal vein occlusion, left eye, with macular edema: Secondary | ICD-10-CM | POA: Diagnosis not present

## 2020-05-12 DIAGNOSIS — H348122 Central retinal vein occlusion, left eye, stable: Secondary | ICD-10-CM | POA: Diagnosis not present

## 2020-05-12 DIAGNOSIS — H353132 Nonexudative age-related macular degeneration, bilateral, intermediate dry stage: Secondary | ICD-10-CM | POA: Diagnosis not present

## 2020-05-12 DIAGNOSIS — H43811 Vitreous degeneration, right eye: Secondary | ICD-10-CM | POA: Diagnosis not present

## 2020-06-16 DIAGNOSIS — H2513 Age-related nuclear cataract, bilateral: Secondary | ICD-10-CM | POA: Diagnosis not present

## 2020-06-16 DIAGNOSIS — H353132 Nonexudative age-related macular degeneration, bilateral, intermediate dry stage: Secondary | ICD-10-CM | POA: Diagnosis not present

## 2020-06-16 DIAGNOSIS — H43811 Vitreous degeneration, right eye: Secondary | ICD-10-CM | POA: Diagnosis not present

## 2020-06-16 DIAGNOSIS — H348122 Central retinal vein occlusion, left eye, stable: Secondary | ICD-10-CM | POA: Diagnosis not present

## 2020-06-19 DIAGNOSIS — L821 Other seborrheic keratosis: Secondary | ICD-10-CM | POA: Diagnosis not present

## 2020-06-19 DIAGNOSIS — L570 Actinic keratosis: Secondary | ICD-10-CM | POA: Diagnosis not present

## 2020-06-19 DIAGNOSIS — Z85828 Personal history of other malignant neoplasm of skin: Secondary | ICD-10-CM | POA: Diagnosis not present

## 2020-07-01 ENCOUNTER — Inpatient Hospital Stay: Payer: Medicare Other | Attending: Hematology & Oncology

## 2020-07-01 DIAGNOSIS — I10 Essential (primary) hypertension: Secondary | ICD-10-CM | POA: Insufficient documentation

## 2020-07-01 DIAGNOSIS — R5383 Other fatigue: Secondary | ICD-10-CM | POA: Insufficient documentation

## 2020-07-01 DIAGNOSIS — C9000 Multiple myeloma not having achieved remission: Secondary | ICD-10-CM | POA: Insufficient documentation

## 2020-07-01 DIAGNOSIS — Z79899 Other long term (current) drug therapy: Secondary | ICD-10-CM | POA: Insufficient documentation

## 2020-07-07 ENCOUNTER — Encounter: Payer: Self-pay | Admitting: Hematology & Oncology

## 2020-07-07 ENCOUNTER — Telehealth: Payer: Self-pay

## 2020-07-07 ENCOUNTER — Other Ambulatory Visit: Payer: Self-pay

## 2020-07-07 ENCOUNTER — Inpatient Hospital Stay (HOSPITAL_BASED_OUTPATIENT_CLINIC_OR_DEPARTMENT_OTHER): Payer: Medicare Other | Admitting: Hematology & Oncology

## 2020-07-07 ENCOUNTER — Inpatient Hospital Stay: Payer: Medicare Other

## 2020-07-07 VITALS — BP 137/65 | HR 63 | Temp 97.7°F | Resp 18 | Wt 171.8 lb

## 2020-07-07 DIAGNOSIS — R5383 Other fatigue: Secondary | ICD-10-CM | POA: Diagnosis not present

## 2020-07-07 DIAGNOSIS — I10 Essential (primary) hypertension: Secondary | ICD-10-CM | POA: Diagnosis not present

## 2020-07-07 DIAGNOSIS — Z79899 Other long term (current) drug therapy: Secondary | ICD-10-CM | POA: Diagnosis not present

## 2020-07-07 DIAGNOSIS — C9000 Multiple myeloma not having achieved remission: Secondary | ICD-10-CM

## 2020-07-07 DIAGNOSIS — D472 Monoclonal gammopathy: Secondary | ICD-10-CM

## 2020-07-07 LAB — CMP (CANCER CENTER ONLY)
ALT: 8 U/L (ref 0–44)
AST: 12 U/L — ABNORMAL LOW (ref 15–41)
Albumin: 4.1 g/dL (ref 3.5–5.0)
Alkaline Phosphatase: 111 U/L (ref 38–126)
Anion gap: 8 (ref 5–15)
BUN: 25 mg/dL — ABNORMAL HIGH (ref 8–23)
CO2: 29 mmol/L (ref 22–32)
Calcium: 9.4 mg/dL (ref 8.9–10.3)
Chloride: 105 mmol/L (ref 98–111)
Creatinine: 1.51 mg/dL — ABNORMAL HIGH (ref 0.61–1.24)
GFR, Estimated: 46 mL/min — ABNORMAL LOW (ref >60)
Glucose, Bld: 130 mg/dL — ABNORMAL HIGH (ref 70–99)
Potassium: 4.1 mmol/L (ref 3.5–5.1)
Sodium: 142 mmol/L (ref 135–145)
Total Bilirubin: 0.4 mg/dL (ref 0.3–1.2)
Total Protein: 7 g/dL (ref 6.5–8.1)

## 2020-07-07 LAB — CBC WITH DIFFERENTIAL (CANCER CENTER ONLY)
Abs Immature Granulocytes: 0.03 10*3/uL (ref 0.00–0.07)
Basophils Absolute: 0.1 10*3/uL (ref 0.0–0.1)
Basophils Relative: 1 %
Eosinophils Absolute: 0.2 10*3/uL (ref 0.0–0.5)
Eosinophils Relative: 2 %
HCT: 39.4 % (ref 39.0–52.0)
Hemoglobin: 12.6 g/dL — ABNORMAL LOW (ref 13.0–17.0)
Immature Granulocytes: 0 %
Lymphocytes Relative: 19 %
Lymphs Abs: 1.4 10*3/uL (ref 0.7–4.0)
MCH: 28.1 pg (ref 26.0–34.0)
MCHC: 32 g/dL (ref 30.0–36.0)
MCV: 87.9 fL (ref 80.0–100.0)
Monocytes Absolute: 0.4 10*3/uL (ref 0.1–1.0)
Monocytes Relative: 6 %
Neutro Abs: 5.3 10*3/uL (ref 1.7–7.7)
Neutrophils Relative %: 72 %
Platelet Count: 245 10*3/uL (ref 150–400)
RBC: 4.48 MIL/uL (ref 4.22–5.81)
RDW: 13 % (ref 11.5–15.5)
WBC Count: 7.4 10*3/uL (ref 4.0–10.5)
nRBC: 0 % (ref 0.0–0.2)

## 2020-07-07 LAB — LACTATE DEHYDROGENASE: LDH: 111 U/L (ref 98–192)

## 2020-07-07 NOTE — Progress Notes (Signed)
Hematology and Oncology Follow Up Visit  James Marks [id number] 05-04-1940 80 y.o. 07/07/2020   Principle Diagnosis:  IgA kappa smoldering myeloma-normal chromosomes  Current Therapy:   Observation     Interim History:  James Marks is back for follow-up.  He still is dealing with the visual issues with the left eye after the central retinal vein occlusion.  He has been seen by ophthalmology.  He has had injections into the eye.  Unfortunately, they really did not help.  He is still active.  He is still playing golf.  I am just happy that he is able to have a active lifestyle.  Over last saw him in February, his monoclonal spike was 0.7 g/dL.  The IgG level was 1044 mg/dL.  We did a 24-hour urine on him.  This was back in February.  He had 83 mg of Kappa light chain in his urine.  Again this is stable at best.  He has had no problems with nausea or vomiting.  He has had no rashes.  He has had no problems with COVID.  Currently, his performance status is probably ECOG 1.     Medications:  Current Outpatient Medications:    amLODipine (NORVASC) 5 MG tablet, Take 1 tablet (5 mg total) by mouth daily., Disp: 90 tablet, Rfl: 3   gabapentin (NEURONTIN) 300 MG capsule, One capsule with breakfast and dinner and two at bedtime., Disp: 360 capsule, Rfl: 4   losartan (COZAAR) 50 MG tablet, Take 1 tablet (50 mg total) by mouth daily., Disp: 90 tablet, Rfl: 3   Multiple Vitamins-Minerals (PRESERVISION AREDS 2 PO), Take by mouth daily at 6 (six) AM., Disp: , Rfl:    amoxicillin (AMOXIL) 500 MG capsule, Take four tablets (2000 mg) total one hour prior to dental procedure. (Patient not taking: No sig reported), Disp: 4 capsule, Rfl: 0 No current facility-administered medications for this visit.  Facility-Administered Medications Ordered in Other Visits:    0.9 %  sodium chloride infusion, , Intravenous, Continuous, Saoirse Legere, Rudell Cobb, MD, Stopped at 08/05/14 1710  Allergies: No Known  Allergies  Past Medical History, Surgical history, Social history, and Family History were reviewed and updated.  Review of Systems: Review of Systems  Constitutional:  Positive for malaise/fatigue.  HENT:  Positive for congestion.   Eyes: Negative.   Respiratory:  Positive for shortness of breath.   Cardiovascular:  Positive for palpitations.  Gastrointestinal: Negative.   Genitourinary: Negative.   Musculoskeletal:  Positive for back pain and myalgias.  Skin: Negative.   Neurological:  Positive for tingling.  Endo/Heme/Allergies: Negative.   Psychiatric/Behavioral: Negative.     Physical Exam:  weight is 171 lb 12.8 oz (77.9 kg). His oral temperature is 97.7 F (36.5 C). His blood pressure is 137/65 and his pulse is 63. His respiration is 18 and oxygen saturation is 99%.   Wt Readings from Last 3 Encounters:  07/07/20 171 lb 12.8 oz (77.9 kg)  03/06/20 174 lb 6.4 oz (79.1 kg)  11/02/19 173 lb 3.2 oz (78.6 kg)     Physical Exam Vitals reviewed.  HENT:     Head: Normocephalic and atraumatic.  Eyes:     Pupils: Pupils are equal, round, and reactive to light.  Cardiovascular:     Rate and Rhythm: Normal rate and regular rhythm.     Heart sounds: Normal heart sounds.  Pulmonary:     Effort: Pulmonary effort is normal.     Breath sounds: Normal breath sounds.  Abdominal:  General: Bowel sounds are normal.     Palpations: Abdomen is soft.  Musculoskeletal:        General: No tenderness or deformity. Normal range of motion.     Cervical back: Normal range of motion.  Lymphadenopathy:     Cervical: No cervical adenopathy.  Skin:    General: Skin is warm and dry.     Findings: No erythema or rash.  Neurological:     Mental Status: He is alert and oriented to person, place, and time.  Psychiatric:        Behavior: Behavior normal.        Thought Content: Thought content normal.        Judgment: Judgment normal.     Lab Results  Component Value Date   WBC 7.4  07/07/2020   HGB 12.6 (L) 07/07/2020   HCT 39.4 07/07/2020   MCV 87.9 07/07/2020   PLT 245 07/07/2020     Chemistry      Component Value Date/Time   NA 142 07/07/2020 1014   NA 143 12/06/2016 1156   NA 139 08/02/2016 1146   K 4.1 07/07/2020 1014   K 4.2 12/06/2016 1156   K 4.7 08/02/2016 1146   CL 105 07/07/2020 1014   CL 107 12/06/2016 1156   CO2 29 07/07/2020 1014   CO2 28 12/06/2016 1156   CO2 24 08/02/2016 1146   BUN 25 (H) 07/07/2020 1014   BUN 29 (H) 12/06/2016 1156   BUN 34.3 (H) 08/02/2016 1146   CREATININE 1.51 (H) 07/07/2020 1014   CREATININE 2.1 (H) 12/06/2016 1156   CREATININE 1.7 (H) 08/02/2016 1146      Component Value Date/Time   CALCIUM 9.4 07/07/2020 1014   CALCIUM 9.1 12/06/2016 1156   CALCIUM 9.5 08/02/2016 1146   ALKPHOS 111 07/07/2020 1014   ALKPHOS 108 (H) 12/06/2016 1156   ALKPHOS 115 08/02/2016 1146   AST 12 (L) 07/07/2020 1014   AST 14 08/02/2016 1146   ALT 8 07/07/2020 1014   ALT 18 12/06/2016 1156   ALT 9 08/02/2016 1146   BILITOT 0.4 07/07/2020 1014   BILITOT 0.41 08/02/2016 1146      Impression and Plan: James Marks is 80 year old man with IgA kappa smoldering myeloma.  Overall, this is been holding steady.  We will see what his 24-hour urine shows today.  Again, I just cannot imagine that this retinal vein occlusion is anything related to the myeloma.  I do know that some patients with IgA myeloma can have some thromboembolic disease.  I really do not think that his level is all that high.  I am glad that he is able to function pretty well.  For right now, we will plan to get him back in another 4 months.  We will plan to get him through the summertime.       [name], MD 6/27/202212:01 PM

## 2020-07-07 NOTE — Telephone Encounter (Signed)
07/07/20 los req pt to return in 4 months, pt declined and will only come back in 6 months, appts made and a calendar has been printed   James Marks

## 2020-07-08 LAB — KAPPA/LAMBDA LIGHT CHAINS
Kappa free light chain: 86 mg/L — ABNORMAL HIGH (ref 3.3–19.4)
Kappa, lambda light chain ratio: 3.47 — ABNORMAL HIGH (ref 0.26–1.65)
Lambda free light chains: 24.8 mg/L (ref 5.7–26.3)

## 2020-07-08 LAB — IGG, IGA, IGM
IgA: 979 mg/dL — ABNORMAL HIGH (ref 61–437)
IgG (Immunoglobin G), Serum: 922 mg/dL (ref 603–1613)
IgM (Immunoglobulin M), Srm: 63 mg/dL (ref 15–143)

## 2020-07-09 ENCOUNTER — Telehealth: Payer: Self-pay | Admitting: *Deleted

## 2020-07-09 LAB — UPEP/UIFE/LIGHT CHAINS/TP, 24-HR UR
% BETA, Urine: 0 %
ALPHA 1 URINE: 0 %
Albumin, U: 100 %
Alpha 2, Urine: 0 %
Free Kappa Lt Chains,Ur: 114.57 mg/L — ABNORMAL HIGH (ref 1.17–86.46)
Free Kappa/Lambda Ratio: 24.32 — ABNORMAL HIGH (ref 1.83–14.26)
Free Lambda Lt Chains,Ur: 4.71 mg/L (ref 0.27–15.21)
GAMMA GLOBULIN URINE: 0 %
Total Protein, Urine-Ur/day: <104 mg/24 hr (ref 30–150)
Total Protein, Urine: <4 mg/dL
Total Volume: 2600

## 2020-07-09 LAB — IMMUNOFIXATION REFLEX, SERUM
IgA: 1035 mg/dL — ABNORMAL HIGH (ref 61–437)
IgG (Immunoglobin G), Serum: 902 mg/dL (ref 603–1613)
IgM (Immunoglobulin M), Srm: 62 mg/dL (ref 15–143)

## 2020-07-09 LAB — PROTEIN ELECTROPHORESIS, SERUM, WITH REFLEX
A/G Ratio: 1.2 (ref 0.7–1.7)
Albumin ELP: 3.8 g/dL (ref 2.9–4.4)
Alpha-1-Globulin: 0.2 g/dL (ref 0.0–0.4)
Alpha-2-Globulin: 0.7 g/dL (ref 0.4–1.0)
Beta Globulin: 1.5 g/dL — ABNORMAL HIGH (ref 0.7–1.3)
Gamma Globulin: 0.9 g/dL (ref 0.4–1.8)
Globulin, Total: 3.3 g/dL (ref 2.2–3.9)
M-Spike, %: 0.5 g/dL — ABNORMAL HIGH
SPEP Interpretation: 0
Total Protein ELP: 7.1 g/dL (ref 6.0–8.5)

## 2020-07-09 NOTE — Telephone Encounter (Signed)
-----   Message from [name], MD sent at 07/09/2020  5:47 PM EDT ----- His myeloma studies are still nice and stable.  His myeloma level is 0.5.  It was this way about a year ago.  Laurey Arrow

## 2020-07-09 NOTE — Telephone Encounter (Signed)
As noted below by Dr. Marin Olp, I informed his wife that the myeloma studies are still nice and stable at 0.5. She verbalized understanding.

## 2020-08-01 DIAGNOSIS — H2513 Age-related nuclear cataract, bilateral: Secondary | ICD-10-CM | POA: Diagnosis not present

## 2020-08-01 DIAGNOSIS — H34812 Central retinal vein occlusion, left eye, with macular edema: Secondary | ICD-10-CM | POA: Diagnosis not present

## 2020-08-01 DIAGNOSIS — H353132 Nonexudative age-related macular degeneration, bilateral, intermediate dry stage: Secondary | ICD-10-CM | POA: Diagnosis not present

## 2020-08-01 DIAGNOSIS — H43811 Vitreous degeneration, right eye: Secondary | ICD-10-CM | POA: Diagnosis not present

## 2020-08-27 ENCOUNTER — Telehealth: Payer: Self-pay | Admitting: Family Medicine

## 2020-08-27 NOTE — Chronic Care Management (AMB) (Signed)
  Chronic Care Management   Outreach Note  08/27/2020 Name: James Marks MRN: [id number] DOB: [birth year]  Referred by: Eulas Post, MD Reason for referral : No chief complaint on file.   An unsuccessful telephone outreach was attempted today. The patient was referred to the pharmacist for assistance with care management and care coordination.   Follow Up Plan:   Tatjana Dellinger Upstream Scheduler

## 2020-09-02 DIAGNOSIS — H18413 Arcus senilis, bilateral: Secondary | ICD-10-CM | POA: Diagnosis not present

## 2020-09-02 DIAGNOSIS — H348122 Central retinal vein occlusion, left eye, stable: Secondary | ICD-10-CM | POA: Diagnosis not present

## 2020-09-02 DIAGNOSIS — H2513 Age-related nuclear cataract, bilateral: Secondary | ICD-10-CM | POA: Diagnosis not present

## 2020-09-02 DIAGNOSIS — H2511 Age-related nuclear cataract, right eye: Secondary | ICD-10-CM | POA: Diagnosis not present

## 2020-09-02 DIAGNOSIS — H25043 Posterior subcapsular polar age-related cataract, bilateral: Secondary | ICD-10-CM | POA: Diagnosis not present

## 2020-09-09 DIAGNOSIS — H353132 Nonexudative age-related macular degeneration, bilateral, intermediate dry stage: Secondary | ICD-10-CM | POA: Diagnosis not present

## 2020-09-09 DIAGNOSIS — H34812 Central retinal vein occlusion, left eye, with macular edema: Secondary | ICD-10-CM | POA: Diagnosis not present

## 2020-10-20 DIAGNOSIS — Z23 Encounter for immunization: Secondary | ICD-10-CM | POA: Diagnosis not present

## 2020-10-21 DIAGNOSIS — H43811 Vitreous degeneration, right eye: Secondary | ICD-10-CM | POA: Diagnosis not present

## 2020-10-21 DIAGNOSIS — H34812 Central retinal vein occlusion, left eye, with macular edema: Secondary | ICD-10-CM | POA: Diagnosis not present

## 2020-10-21 DIAGNOSIS — H353132 Nonexudative age-related macular degeneration, bilateral, intermediate dry stage: Secondary | ICD-10-CM | POA: Diagnosis not present

## 2020-11-10 DIAGNOSIS — H2511 Age-related nuclear cataract, right eye: Secondary | ICD-10-CM | POA: Diagnosis not present

## 2020-11-10 DIAGNOSIS — H25813 Combined forms of age-related cataract, bilateral: Secondary | ICD-10-CM | POA: Diagnosis not present

## 2020-11-11 DIAGNOSIS — H2512 Age-related nuclear cataract, left eye: Secondary | ICD-10-CM | POA: Diagnosis not present

## 2020-11-21 DIAGNOSIS — H353132 Nonexudative age-related macular degeneration, bilateral, intermediate dry stage: Secondary | ICD-10-CM | POA: Diagnosis not present

## 2020-11-21 DIAGNOSIS — H34812 Central retinal vein occlusion, left eye, with macular edema: Secondary | ICD-10-CM | POA: Diagnosis not present

## 2020-11-24 ENCOUNTER — Ambulatory Visit (INDEPENDENT_AMBULATORY_CARE_PROVIDER_SITE_OTHER): Payer: Medicare Other | Admitting: Family Medicine

## 2020-11-24 VITALS — BP 122/64 | HR 64 | Temp 97.5°F | Wt 172.0 lb

## 2020-11-24 DIAGNOSIS — G6289 Other specified polyneuropathies: Secondary | ICD-10-CM | POA: Diagnosis not present

## 2020-11-24 DIAGNOSIS — N1832 Chronic kidney disease, stage 3b: Secondary | ICD-10-CM

## 2020-11-24 DIAGNOSIS — R739 Hyperglycemia, unspecified: Secondary | ICD-10-CM

## 2020-11-24 DIAGNOSIS — I10 Essential (primary) hypertension: Secondary | ICD-10-CM

## 2020-11-24 LAB — POCT GLYCOSYLATED HEMOGLOBIN (HGB A1C): Hemoglobin A1C: 6 % — AB (ref 4.0–5.6)

## 2020-11-24 NOTE — Progress Notes (Signed)
Established Patient Office Visit  Subjective:  Patient ID: James Marks, male    DOB: July 15, 1940  Age: 80 y.o. MRN: [id number]  CC:  Chief Complaint  Patient presents with   Annual Exam    HPI James Marks presents for medical follow-up.  He has history of multiple myeloma followed by oncology.  Gets labs through them every 6 months.  We noticed that he had several blood sugars that have been slightly elevated there but he is not sure if these were fasting.  Denies any polyuria or polydipsia.  He has hypertension treated with losartan and amlodipine.  No recent dizziness.  No headaches.  No chest pains.  Has had some recent eye issues.  He had recent cataract surgery and is getting ready to have surgery on the other eye soon.  Also history of retinal vein occlusion of the left.  He has fairly severely diminished vision in the left eye.  He has chronic kidney disease stage III.  Kidney function has actually been slowly improving past few years.  Past history of gout but no recent flareups.  Past Medical History:  Diagnosis Date   Cancer ([place])    myeloma   Diverticulitis    Hearing loss    Hx of colonic polyps    Hyperlipidemia    Hypertension    Kidney stones    Osteopenia    Smoldering multiple myeloma ([place]) 08/26/2014   Subarachnoid hemorrhage ([place]) 2/01   nontraumatic    Past Surgical History:  Procedure Laterality Date   None      Family History  Problem Relation Age of Onset   Cancer Mother        breast   Diabetes Mother    Hypertension Father    Diabetes Other        parent   Macular degeneration Brother    Diabetes Mellitus II Brother     Social History   Socioeconomic History   Marital status: Married    Spouse name: Not on file   Number of children: Not on file   Years of education: Not on file   Highest education level: Not on file  Occupational History   Not on file  Tobacco Use   Smoking status: Every Day    Packs/day: 0.20    Years:  55.00    Pack years: 11.00    Types: Cigarettes    Start date: 10/05/1952    Last attempt to quit: 04/04/2012    Years since quitting: 8.6   Smokeless tobacco: Former    Quit date: 10/05/2012   Tobacco comments:    3 cigs/day  Vaping Use   Vaping Use: Never used  Substance and Sexual Activity   Alcohol use: Yes    Alcohol/week: 0.0 standard drinks    Comment: Rare, once every three months   Drug use: No   Sexual activity: Not on file  Other Topics Concern   Not on file  Social History Narrative   Lives with wife in a 2 story home.  Has 1 child.  Retired from [employer].  Education: 12th grade.   Social Determinants of Health   Financial Resource Strain: Not on file  Food Insecurity: Not on file  Transportation Needs: Not on file  Physical Activity: Not on file  Stress: Not on file  Social Connections: Not on file  Intimate Partner Violence: Not on file    Outpatient Medications Prior to Visit  Medication Sig Dispense Refill  amLODipine (NORVASC) 5 MG tablet Take 1 tablet (5 mg total) by mouth daily. 90 tablet 3   amoxicillin (AMOXIL) 500 MG capsule Take four tablets (2000 mg) total one hour prior to dental procedure. 4 capsule 0   gabapentin (NEURONTIN) 300 MG capsule One capsule with breakfast and dinner and two at bedtime. 360 capsule 4   losartan (COZAAR) 50 MG tablet Take 1 tablet (50 mg total) by mouth daily. 90 tablet 3   Multiple Vitamins-Minerals (PRESERVISION AREDS 2 PO) Take by mouth daily at 6 (six) AM.     Facility-Administered Medications Prior to Visit  Medication Dose Route Frequency Provider Last Rate Last Admin   0.9 %  sodium chloride infusion   Intravenous Continuous [name], MD   Stopped at 08/05/14 1710    No Known Allergies  ROS Review of Systems  Constitutional:  Negative for fatigue.  Eyes:  Negative for visual disturbance.  Respiratory:  Negative for cough, chest tightness and shortness of breath.   Cardiovascular:  Negative for  chest pain, palpitations and leg swelling.  Neurological:  Negative for dizziness, syncope, weakness, light-headedness and headaches.     Objective:    Physical Exam Constitutional:      Appearance: He is well-developed.  HENT:     Right Ear: External ear normal.     Left Ear: External ear normal.  Eyes:     Pupils: Pupils are equal, round, and reactive to light.  Neck:     Thyroid: No thyromegaly.  Cardiovascular:     Rate and Rhythm: Normal rate and regular rhythm.  Pulmonary:     Effort: Pulmonary effort is normal. No respiratory distress.     Breath sounds: Normal breath sounds. No wheezing or rales.  Musculoskeletal:     Cervical back: Neck supple.     Right lower leg: No edema.     Left lower leg: No edema.  Neurological:     Mental Status: He is alert and oriented to person, place, and time.    BP 122/64 (BP Location: Left Arm, Patient Position: Sitting, Cuff Size: Normal)   Pulse 64   Temp (!) 97.5 F (36.4 C) (Oral)   Wt 172 lb (78 kg)   SpO2 99%   BMI 22.08 kg/m  Wt Readings from Last 3 Encounters:  11/24/20 172 lb (78 kg)  07/07/20 171 lb 12.8 oz (77.9 kg)  03/06/20 174 lb 6.4 oz (79.1 kg)     Health Maintenance Due  Topic Date Due   Zoster Vaccines- Shingrix (1 of 2) Never done   TETANUS/TDAP  01/12/2015   COVID-19 Vaccine (5 - Booster for Pfizer series) 06/26/2020    There are no preventive care reminders to display for this patient.  Lab Results  Component Value Date   TSH 0.763 03/29/2014   Lab Results  Component Value Date   WBC 7.4 07/07/2020   HGB 12.6 (L) 07/07/2020   HCT 39.4 07/07/2020   MCV 87.9 07/07/2020   PLT 245 07/07/2020   Lab Results  Component Value Date   NA 142 07/07/2020   K 4.1 07/07/2020   CHLORIDE 107 08/02/2016   CO2 29 07/07/2020   GLUCOSE 130 (H) 07/07/2020   BUN 25 (H) 07/07/2020   CREATININE 1.51 (H) 07/07/2020   BILITOT 0.4 07/07/2020   ALKPHOS 111 07/07/2020   AST 12 (L) 07/07/2020   ALT 8  07/07/2020   PROT 7.0 07/07/2020   ALBUMIN 4.1 07/07/2020   CALCIUM 9.4 07/07/2020  ANIONGAP 8 07/07/2020   EGFR 37 (L) 08/02/2016   GFR 39.58 (L) 10/05/2013   Lab Results  Component Value Date   CHOL 153 10/04/2012   Lab Results  Component Value Date   HDL 39.10 10/04/2012   Lab Results  Component Value Date   LDLCALC 97 10/04/2012   Lab Results  Component Value Date   TRIG 85.0 10/04/2012   Lab Results  Component Value Date   CHOLHDL 4 10/04/2012   Lab Results  Component Value Date   HGBA1C 6.0 (A) 11/24/2020      Assessment & Plan:   #1 hypertension stable and at goal -Continue amlodipine and losartan.  He should have refills until February  #2 hyperglycemia by recent labs.  A1c today 6.0%.  Discussed diet and lifestyle modification.  Scale back high glycemic foods.  Try to establish more consistent exercise with walking.  Consider at least yearly A1c  #3 history of chronic kidney disease stage III-continue to avoid regular use of nonsteroidals   No orders of the defined types were placed in this encounter.   Follow-up: No follow-ups on file.    Carolann Littler, MD

## 2020-12-01 DIAGNOSIS — H2512 Age-related nuclear cataract, left eye: Secondary | ICD-10-CM | POA: Diagnosis not present

## 2020-12-15 ENCOUNTER — Other Ambulatory Visit: Payer: Self-pay | Admitting: Family Medicine

## 2020-12-15 DIAGNOSIS — H00015 Hordeolum externum left lower eyelid: Secondary | ICD-10-CM | POA: Diagnosis not present

## 2020-12-22 ENCOUNTER — Other Ambulatory Visit: Payer: Self-pay

## 2020-12-22 ENCOUNTER — Inpatient Hospital Stay: Payer: Medicare Other | Attending: Hematology & Oncology

## 2020-12-22 DIAGNOSIS — D472 Monoclonal gammopathy: Secondary | ICD-10-CM | POA: Insufficient documentation

## 2020-12-24 ENCOUNTER — Encounter: Payer: Self-pay | Admitting: Hematology & Oncology

## 2020-12-24 ENCOUNTER — Other Ambulatory Visit: Payer: Self-pay

## 2020-12-24 ENCOUNTER — Inpatient Hospital Stay (HOSPITAL_BASED_OUTPATIENT_CLINIC_OR_DEPARTMENT_OTHER): Payer: Medicare Other | Admitting: Hematology & Oncology

## 2020-12-24 ENCOUNTER — Inpatient Hospital Stay: Payer: Medicare Other

## 2020-12-24 VITALS — BP 121/51 | HR 51 | Temp 97.8°F | Resp 16 | Wt 173.0 lb

## 2020-12-24 DIAGNOSIS — D472 Monoclonal gammopathy: Secondary | ICD-10-CM

## 2020-12-24 LAB — CBC WITH DIFFERENTIAL (CANCER CENTER ONLY)
Abs Immature Granulocytes: 0.04 10*3/uL (ref 0.00–0.07)
Basophils Absolute: 0.1 10*3/uL (ref 0.0–0.1)
Basophils Relative: 1 %
Eosinophils Absolute: 0.2 10*3/uL (ref 0.0–0.5)
Eosinophils Relative: 2 %
HCT: 40.3 % (ref 39.0–52.0)
Hemoglobin: 12.6 g/dL — ABNORMAL LOW (ref 13.0–17.0)
Immature Granulocytes: 1 %
Lymphocytes Relative: 18 %
Lymphs Abs: 1.5 10*3/uL (ref 0.7–4.0)
MCH: 27.9 pg (ref 26.0–34.0)
MCHC: 31.3 g/dL (ref 30.0–36.0)
MCV: 89.2 fL (ref 80.0–100.0)
Monocytes Absolute: 0.6 10*3/uL (ref 0.1–1.0)
Monocytes Relative: 7 %
Neutro Abs: 5.8 10*3/uL (ref 1.7–7.7)
Neutrophils Relative %: 71 %
Platelet Count: 224 10*3/uL (ref 150–400)
RBC: 4.52 MIL/uL (ref 4.22–5.81)
RDW: 14 % (ref 11.5–15.5)
WBC Count: 8.1 10*3/uL (ref 4.0–10.5)
nRBC: 0 % (ref 0.0–0.2)

## 2020-12-24 LAB — CMP (CANCER CENTER ONLY)
ALT: 9 U/L (ref 0–44)
AST: 11 U/L — ABNORMAL LOW (ref 15–41)
Albumin: 4 g/dL (ref 3.5–5.0)
Alkaline Phosphatase: 105 U/L (ref 38–126)
Anion gap: 6 (ref 5–15)
BUN: 27 mg/dL — ABNORMAL HIGH (ref 8–23)
CO2: 29 mmol/L (ref 22–32)
Calcium: 9.4 mg/dL (ref 8.9–10.3)
Chloride: 105 mmol/L (ref 98–111)
Creatinine: 1.61 mg/dL — ABNORMAL HIGH (ref 0.61–1.24)
GFR, Estimated: 43 mL/min — ABNORMAL LOW (ref >60)
Glucose, Bld: 100 mg/dL — ABNORMAL HIGH (ref 70–99)
Potassium: 4.7 mmol/L (ref 3.5–5.1)
Sodium: 140 mmol/L (ref 135–145)
Total Bilirubin: 0.4 mg/dL (ref 0.3–1.2)
Total Protein: 7 g/dL (ref 6.5–8.1)

## 2020-12-24 LAB — LACTATE DEHYDROGENASE: LDH: 105 U/L (ref 98–192)

## 2020-12-24 NOTE — Progress Notes (Signed)
Hematology and Oncology Follow Up Visit  James Marks [id number] Dec 24, 1940 80 y.o. 12/24/2020   Principle Diagnosis:  IgA kappa smoldering myeloma-normal chromosomes  Current Therapy:   Observation     Interim History:  James Marks is back for follow-up.  He is managing pretty well.  He did have cataract surgery.  Unfortunately, I think the left eye still is having problems.  I think he had a past problem with a retinal vein occlusion with the left eye.  He wants get back to playing golf.  Hopefully, he will be able to see him moving better on the golf course.  He has had no problems with infections.  He has had no cough or shortness of breath.  He has had no problems with bowels or bladder.  He has had no rashes.  When we last saw him 6 months ago, his monoclonal spike was 0.5 g/dL.  The IgA level was 1000 mg/dL.  The Kappa light chain was 8.6 mg/dL.  Everything was holding steady.    We did do a 24-hour urine on him.  This showed 114 mg/L of Kappa light chain in the urine which is a little bit.  He has had no problems with COVID.  Has been no rashes.  Overall, I would have to say that his performance status is ECOG 1.   Medications:  Current Outpatient Medications:    amLODipine (NORVASC) 5 MG tablet, TAKE 1 TABLET BY MOUTH  DAILY, Disp: 90 tablet, Rfl: 3   amoxicillin (AMOXIL) 500 MG capsule, Take four tablets (2000 mg) total one hour prior to dental procedure., Disp: 4 capsule, Rfl: 0   gabapentin (NEURONTIN) 300 MG capsule, One capsule with breakfast and dinner and two at bedtime., Disp: 360 capsule, Rfl: 4   gatifloxacin (ZYMAXID) 0.5 % SOLN, Place 1 drop into the right eye 4 (four) times daily., Disp: , Rfl:    losartan (COZAAR) 50 MG tablet, TAKE 1 TABLET BY MOUTH  DAILY, Disp: 90 tablet, Rfl: 3   moxifloxacin (VIGAMOX) 0.5 % ophthalmic solution, Place 1 drop into the left eye 4 (four) times daily., Disp: , Rfl:    Multiple Vitamins-Minerals (PRESERVISION AREDS 2  PO), Take by mouth daily at 6 (six) AM., Disp: , Rfl:    neomycin-polymyxin b-dexamethasone (MAXITROL) 3.5-10000-0.1 OINT, , Disp: , Rfl:    prednisoLONE acetate (PRED FORTE) 1 % ophthalmic suspension, Place 1 drop into the left eye 4 (four) times daily., Disp: , Rfl:    PROLENSA 0.07 % SOLN, SMARTSIG:1 Drop(s) Right Eye Every Evening, Disp: , Rfl:  No current facility-administered medications for this visit.  Facility-Administered Medications Ordered in Other Visits:    0.9 %  sodium chloride infusion, , Intravenous, Continuous, Myya Meenach, Rudell Cobb, MD, Stopped at 08/05/14 1710  Allergies: No Known Allergies  Past Medical History, Surgical history, Social history, and Family History were reviewed and updated.  Review of Systems: Review of Systems  Constitutional:  Positive for malaise/fatigue.  HENT:  Positive for congestion.   Eyes: Negative.   Respiratory:  Positive for shortness of breath.   Cardiovascular:  Positive for palpitations.  Gastrointestinal: Negative.   Genitourinary: Negative.   Musculoskeletal:  Positive for back pain and myalgias.  Skin: Negative.   Neurological:  Positive for tingling.  Endo/Heme/Allergies: Negative.   Psychiatric/Behavioral: Negative.     Physical Exam:  weight is 173 lb (78.5 kg). His oral temperature is 97.8 F (36.6 C). His blood pressure is 121/51 (abnormal) and his pulse is  51 (abnormal). His respiration is 16 and oxygen saturation is 99%.   Wt Readings from Last 3 Encounters:  12/24/20 173 lb (78.5 kg)  11/24/20 172 lb (78 kg)  07/07/20 171 lb 12.8 oz (77.9 kg)     Physical Exam Vitals reviewed.  HENT:     Head: Normocephalic and atraumatic.  Eyes:     Pupils: Pupils are equal, round, and reactive to light.  Cardiovascular:     Rate and Rhythm: Normal rate and regular rhythm.     Heart sounds: Normal heart sounds.  Pulmonary:     Effort: Pulmonary effort is normal.     Breath sounds: Normal breath sounds.  Abdominal:      General: Bowel sounds are normal.     Palpations: Abdomen is soft.  Musculoskeletal:        General: No tenderness or deformity. Normal range of motion.     Cervical back: Normal range of motion.  Lymphadenopathy:     Cervical: No cervical adenopathy.  Skin:    General: Skin is warm and dry.     Findings: No erythema or rash.  Neurological:     Mental Status: He is alert and oriented to person, place, and time.  Psychiatric:        Behavior: Behavior normal.        Thought Content: Thought content normal.        Judgment: Judgment normal.     Lab Results  Component Value Date   WBC 8.1 12/24/2020   HGB 12.6 (L) 12/24/2020   HCT 40.3 12/24/2020   MCV 89.2 12/24/2020   PLT 224 12/24/2020     Chemistry      Component Value Date/Time   NA 140 12/24/2020 1019   NA 143 12/06/2016 1156   NA 139 08/02/2016 1146   K 4.7 12/24/2020 1019   K 4.2 12/06/2016 1156   K 4.7 08/02/2016 1146   CL 105 12/24/2020 1019   CL 107 12/06/2016 1156   CO2 29 12/24/2020 1019   CO2 28 12/06/2016 1156   CO2 24 08/02/2016 1146   BUN 27 (H) 12/24/2020 1019   BUN 29 (H) 12/06/2016 1156   BUN 34.3 (H) 08/02/2016 1146   CREATININE 1.61 (H) 12/24/2020 1019   CREATININE 2.1 (H) 12/06/2016 1156   CREATININE 1.7 (H) 08/02/2016 1146      Component Value Date/Time   CALCIUM 9.4 12/24/2020 1019   CALCIUM 9.1 12/06/2016 1156   CALCIUM 9.5 08/02/2016 1146   ALKPHOS 105 12/24/2020 1019   ALKPHOS 108 (H) 12/06/2016 1156   ALKPHOS 115 08/02/2016 1146   AST 11 (L) 12/24/2020 1019   AST 14 08/02/2016 1146   ALT 9 12/24/2020 1019   ALT 18 12/06/2016 1156   ALT 9 08/02/2016 1146   BILITOT 0.4 12/24/2020 1019   BILITOT 0.41 08/02/2016 1146      Impression and Plan:  James Marks is 80 year old man with IgA kappa smoldering myeloma.  Overall, this is been holding steady.  Having 1 better with respect to his vision.  Maybe, the left eye will improve at some point.  We will still plan for 26-month  follow-up.       [name], MD 12/14/202211:21 AM

## 2020-12-26 LAB — IGG, IGA, IGM
IgA: 1011 mg/dL — ABNORMAL HIGH (ref 61–437)
IgG (Immunoglobin G), Serum: 985 mg/dL (ref 603–1613)
IgM (Immunoglobulin M), Srm: 65 mg/dL (ref 15–143)

## 2020-12-26 LAB — KAPPA/LAMBDA LIGHT CHAINS
Kappa free light chain: 120.2 mg/L — ABNORMAL HIGH (ref 3.3–19.4)
Kappa, lambda light chain ratio: 4.77 — ABNORMAL HIGH (ref 0.26–1.65)
Lambda free light chains: 25.2 mg/L (ref 5.7–26.3)

## 2020-12-30 LAB — UPEP/UIFE/LIGHT CHAINS/TP, 24-HR UR
% BETA, Urine: 0 %
ALPHA 1 URINE: 0 %
Albumin, U: 100 %
Alpha 2, Urine: 0 %
Free Kappa Lt Chains,Ur: 181.92 mg/L — ABNORMAL HIGH (ref 1.17–86.46)
Free Kappa/Lambda Ratio: 21.66 — ABNORMAL HIGH (ref 1.83–14.26)
Free Lambda Lt Chains,Ur: 8.4 mg/L (ref 0.27–15.21)
GAMMA GLOBULIN URINE: 0 %
Total Protein, Urine-Ur/day: 181 mg/24 hr — ABNORMAL HIGH (ref 30–150)
Total Protein, Urine: 8.4 mg/dL
Total Volume: 2150

## 2020-12-31 ENCOUNTER — Telehealth: Payer: Self-pay

## 2020-12-31 LAB — PROTEIN ELECTROPHORESIS, SERUM, WITH REFLEX
A/G Ratio: 1.1 (ref 0.7–1.7)
Albumin ELP: 3.7 g/dL (ref 2.9–4.4)
Alpha-1-Globulin: 0.2 g/dL (ref 0.0–0.4)
Alpha-2-Globulin: 0.7 g/dL (ref 0.4–1.0)
Beta Globulin: 1.5 g/dL — ABNORMAL HIGH (ref 0.7–1.3)
Gamma Globulin: 0.9 g/dL (ref 0.4–1.8)
Globulin, Total: 3.3 g/dL (ref 2.2–3.9)
M-Spike, %: 0.6 g/dL — ABNORMAL HIGH
SPEP Interpretation: 0
Total Protein ELP: 7 g/dL (ref 6.0–8.5)

## 2020-12-31 LAB — IMMUNOFIXATION REFLEX, SERUM
IgA: 1030 mg/dL — ABNORMAL HIGH (ref 61–437)
IgG (Immunoglobin G), Serum: 961 mg/dL (ref 603–1613)
IgM (Immunoglobulin M), Srm: 65 mg/dL (ref 15–143)

## 2020-12-31 NOTE — Telephone Encounter (Signed)
Advised via MyChart.

## 2020-12-31 NOTE — Telephone Encounter (Signed)
-----   Message from [name], MD sent at 12/31/2020  6:51 AM EST ----- Call - the urine does show an increase in the kappa light chain protein.  However, further studies does not show that this should cause any problems.  Laurey Arrow

## 2021-01-01 ENCOUNTER — Encounter: Payer: Self-pay | Admitting: *Deleted

## 2021-01-06 ENCOUNTER — Encounter: Payer: Self-pay | Admitting: Family Medicine

## 2021-01-06 DIAGNOSIS — H348122 Central retinal vein occlusion, left eye, stable: Secondary | ICD-10-CM | POA: Diagnosis not present

## 2021-01-06 DIAGNOSIS — H43811 Vitreous degeneration, right eye: Secondary | ICD-10-CM | POA: Diagnosis not present

## 2021-01-06 DIAGNOSIS — H353132 Nonexudative age-related macular degeneration, bilateral, intermediate dry stage: Secondary | ICD-10-CM | POA: Diagnosis not present

## 2021-01-19 ENCOUNTER — Telehealth: Payer: Self-pay | Admitting: Family Medicine

## 2021-01-19 DIAGNOSIS — H0015 Chalazion left lower eyelid: Secondary | ICD-10-CM | POA: Diagnosis not present

## 2021-01-19 MED ORDER — PREDNISONE 20 MG PO TABS: ORAL_TABLET | ORAL | 0 refills | Status: DC

## 2021-01-19 NOTE — Telephone Encounter (Signed)
Patient's wife called to have medication for gout sent in because he is having a flare up. Wife states this has occurred before and Dr.Burchette has sent something in before and it went away. Patient and wife are leaving to go out of town on Wednesday morning so they will need to pick prescription up on Tuesday.          Please advise

## 2021-01-19 NOTE — Telephone Encounter (Signed)
Rx sent in. Spoke with the patients wife and she is aware.

## 2021-02-10 DIAGNOSIS — H43811 Vitreous degeneration, right eye: Secondary | ICD-10-CM | POA: Diagnosis not present

## 2021-02-10 DIAGNOSIS — H348122 Central retinal vein occlusion, left eye, stable: Secondary | ICD-10-CM | POA: Diagnosis not present

## 2021-02-10 DIAGNOSIS — H00025 Hordeolum internum left lower eyelid: Secondary | ICD-10-CM | POA: Diagnosis not present

## 2021-02-10 DIAGNOSIS — H353132 Nonexudative age-related macular degeneration, bilateral, intermediate dry stage: Secondary | ICD-10-CM | POA: Diagnosis not present

## 2021-04-06 DIAGNOSIS — H43811 Vitreous degeneration, right eye: Secondary | ICD-10-CM | POA: Diagnosis not present

## 2021-04-06 DIAGNOSIS — H34812 Central retinal vein occlusion, left eye, with macular edema: Secondary | ICD-10-CM | POA: Diagnosis not present

## 2021-04-06 DIAGNOSIS — H353132 Nonexudative age-related macular degeneration, bilateral, intermediate dry stage: Secondary | ICD-10-CM | POA: Diagnosis not present

## 2021-05-05 DIAGNOSIS — H353132 Nonexudative age-related macular degeneration, bilateral, intermediate dry stage: Secondary | ICD-10-CM | POA: Diagnosis not present

## 2021-05-05 DIAGNOSIS — H35033 Hypertensive retinopathy, bilateral: Secondary | ICD-10-CM | POA: Diagnosis not present

## 2021-05-05 DIAGNOSIS — H348122 Central retinal vein occlusion, left eye, stable: Secondary | ICD-10-CM | POA: Diagnosis not present

## 2021-05-05 DIAGNOSIS — H43811 Vitreous degeneration, right eye: Secondary | ICD-10-CM | POA: Diagnosis not present

## 2021-05-23 ENCOUNTER — Encounter (HOSPITAL_BASED_OUTPATIENT_CLINIC_OR_DEPARTMENT_OTHER): Payer: Self-pay | Admitting: Emergency Medicine

## 2021-05-23 ENCOUNTER — Other Ambulatory Visit: Payer: Self-pay

## 2021-05-23 ENCOUNTER — Emergency Department (HOSPITAL_BASED_OUTPATIENT_CLINIC_OR_DEPARTMENT_OTHER)
Admission: EM | Admit: 2021-05-23 | Discharge: 2021-05-23 | Disposition: A | Discharge status: Home / Self Care | Payer: Medicare Other | Attending: Emergency Medicine | Admitting: Emergency Medicine

## 2021-05-23 DIAGNOSIS — R197 Diarrhea, unspecified: Secondary | ICD-10-CM | POA: Insufficient documentation

## 2021-05-23 DIAGNOSIS — Z5321 Procedure and treatment not carried out due to patient leaving prior to being seen by health care provider: Secondary | ICD-10-CM | POA: Insufficient documentation

## 2021-05-23 DIAGNOSIS — E860 Dehydration: Secondary | ICD-10-CM | POA: Diagnosis not present

## 2021-05-23 NOTE — ED Triage Notes (Signed)
Diarrhea for 3 days, temp 99.1,has multiple myeloma,not feeling well /weak. Wife had stomach bug earlier this week.  ?

## 2021-06-03 ENCOUNTER — Other Ambulatory Visit: Payer: Self-pay | Admitting: Hematology & Oncology

## 2021-06-16 DIAGNOSIS — H353132 Nonexudative age-related macular degeneration, bilateral, intermediate dry stage: Secondary | ICD-10-CM | POA: Diagnosis not present

## 2021-06-16 DIAGNOSIS — H348122 Central retinal vein occlusion, left eye, stable: Secondary | ICD-10-CM | POA: Diagnosis not present

## 2021-07-01 ENCOUNTER — Other Ambulatory Visit: Payer: Self-pay

## 2021-07-01 ENCOUNTER — Inpatient Hospital Stay (HOSPITAL_BASED_OUTPATIENT_CLINIC_OR_DEPARTMENT_OTHER): Payer: Medicare Other | Admitting: Hematology & Oncology

## 2021-07-01 ENCOUNTER — Encounter: Payer: Self-pay | Admitting: Hematology & Oncology

## 2021-07-01 ENCOUNTER — Other Ambulatory Visit: Payer: Self-pay | Admitting: Oncology

## 2021-07-01 ENCOUNTER — Inpatient Hospital Stay: Payer: Medicare Other

## 2021-07-01 ENCOUNTER — Inpatient Hospital Stay: Payer: Medicare Other | Attending: Hematology & Oncology

## 2021-07-01 VITALS — BP 106/41 | HR 72 | Temp 98.0°F | Resp 18 | Ht 74.02 in | Wt 176.0 lb

## 2021-07-01 DIAGNOSIS — R509 Fever, unspecified: Secondary | ICD-10-CM

## 2021-07-01 DIAGNOSIS — D472 Monoclonal gammopathy: Secondary | ICD-10-CM

## 2021-07-01 DIAGNOSIS — M549 Dorsalgia, unspecified: Secondary | ICD-10-CM | POA: Insufficient documentation

## 2021-07-01 DIAGNOSIS — R5383 Other fatigue: Secondary | ICD-10-CM | POA: Insufficient documentation

## 2021-07-01 DIAGNOSIS — Z7982 Long term (current) use of aspirin: Secondary | ICD-10-CM | POA: Insufficient documentation

## 2021-07-01 DIAGNOSIS — N289 Disorder of kidney and ureter, unspecified: Secondary | ICD-10-CM | POA: Diagnosis not present

## 2021-07-01 LAB — CBC WITH DIFFERENTIAL (CANCER CENTER ONLY)
Abs Immature Granulocytes: 0.08 10*3/uL — ABNORMAL HIGH (ref 0.00–0.07)
Basophils Absolute: 0.1 10*3/uL (ref 0.0–0.1)
Basophils Relative: 0 %
Eosinophils Absolute: 0.1 10*3/uL (ref 0.0–0.5)
Eosinophils Relative: 0 %
HCT: 37.1 % — ABNORMAL LOW (ref 39.0–52.0)
Hemoglobin: 11.6 g/dL — ABNORMAL LOW (ref 13.0–17.0)
Immature Granulocytes: 1 %
Lymphocytes Relative: 4 %
Lymphs Abs: 0.5 10*3/uL — ABNORMAL LOW (ref 0.7–4.0)
MCH: 28 pg (ref 26.0–34.0)
MCHC: 31.3 g/dL (ref 30.0–36.0)
MCV: 89.6 fL (ref 80.0–100.0)
Monocytes Absolute: 0.6 10*3/uL (ref 0.1–1.0)
Monocytes Relative: 4 %
Neutro Abs: 13.5 10*3/uL — ABNORMAL HIGH (ref 1.7–7.7)
Neutrophils Relative %: 91 %
Platelet Count: 190 10*3/uL (ref 150–400)
RBC: 4.14 MIL/uL — ABNORMAL LOW (ref 4.22–5.81)
RDW: 14.3 % (ref 11.5–15.5)
WBC Count: 14.8 10*3/uL — ABNORMAL HIGH (ref 4.0–10.5)
nRBC: 0 % (ref 0.0–0.2)

## 2021-07-01 LAB — URINALYSIS, COMPLETE (UACMP) WITH MICROSCOPIC
Bilirubin Urine: NEGATIVE
Glucose, UA: NEGATIVE mg/dL
Hgb urine dipstick: NEGATIVE
Ketones, ur: NEGATIVE mg/dL
Leukocytes,Ua: NEGATIVE
Nitrite: NEGATIVE
Protein, ur: NEGATIVE mg/dL
Specific Gravity, Urine: 1.015 (ref 1.005–1.030)
pH: 5.5 (ref 5.0–8.0)

## 2021-07-01 LAB — CMP (CANCER CENTER ONLY)
ALT: 10 U/L (ref 0–44)
AST: 12 U/L — ABNORMAL LOW (ref 15–41)
Albumin: 3.9 g/dL (ref 3.5–5.0)
Alkaline Phosphatase: 94 U/L (ref 38–126)
Anion gap: 9 (ref 5–15)
BUN: 35 mg/dL — ABNORMAL HIGH (ref 8–23)
CO2: 25 mmol/L (ref 22–32)
Calcium: 9 mg/dL (ref 8.9–10.3)
Chloride: 103 mmol/L (ref 98–111)
Creatinine: 1.86 mg/dL — ABNORMAL HIGH (ref 0.61–1.24)
GFR, Estimated: 36 mL/min — ABNORMAL LOW (ref >60)
Glucose, Bld: 132 mg/dL — ABNORMAL HIGH (ref 70–99)
Potassium: 4.5 mmol/L (ref 3.5–5.1)
Sodium: 137 mmol/L (ref 135–145)
Total Bilirubin: 0.6 mg/dL (ref 0.3–1.2)
Total Protein: 7.1 g/dL (ref 6.5–8.1)

## 2021-07-01 LAB — LACTATE DEHYDROGENASE: LDH: 110 U/L (ref 98–192)

## 2021-07-01 NOTE — Progress Notes (Signed)
Hematology and Oncology Follow Up Visit  James Marks [id number] May 14, 1940 81 y.o. 07/01/2021   Principle Diagnosis:  IgA kappa smoldering myeloma-normal chromosomes  Current Therapy:   Observation     Interim History:  James Marks is back for follow-up.  He is doing okay okay.  He has some problems with his left eye.  He did have cataract surgery.  However, the left eye still is not doing all that well.  I think he had retinal vein occlusion.  I know that the ophthalmologist is trying to help this.  He did have a temperature last night.  He said his wife had shaking chills.  His temperature was 104 degrees.  He refused to go to the emergency room.  He had no nausea or vomiting.  He had no diarrhea.  There is some back pain.  There is no abdominal pain.  I think it be worthwhile checking a urinalysis on him.  As far as the myeloma is concerned, I am little bit worried about the numbers. His last 24-hour urine did show that his Kappa light chain was up to 182 mg/L.  His serum Kappa light chain was 12 mg/dL.  His monoclonal spike when he last saw him was 0.5 g/dL.  His IgA level was 1020 mg/dL.  Both these are holding steady.  He has had no cough.  He and his wife had the norovirus back in March.  They had to go to the emergency room for fluids.  He had no bleeding.  He has had no leg swelling.  He and his wife just got back from the beach.  I am sure that they had a wonderful time there.  Overall, I would say his performance status is probably ECOG 1.    Medications:  Current Outpatient Medications:    amLODipine (NORVASC) 5 MG tablet, TAKE 1 TABLET BY MOUTH  DAILY, Disp: 90 tablet, Rfl: 3   gabapentin (NEURONTIN) 300 MG capsule, TAKE ONE CAPSULE BY MOUTH DAILY WITH BREAKFAST, 1 CAPSULE WITH DINNER AND 2 CAPSULES AT BEDTIME, Disp: 360 capsule, Rfl: 4   losartan (COZAAR) 50 MG tablet, TAKE 1 TABLET BY MOUTH  DAILY, Disp: 90 tablet, Rfl: 3   Multiple Vitamins-Minerals  (PRESERVISION AREDS 2 PO), Take by mouth daily at 6 (six) AM., Disp: , Rfl:    amoxicillin (AMOXIL) 500 MG capsule, Take four tablets (2000 mg) total one hour prior to dental procedure. (Patient not taking: Reported on 07/01/2021), Disp: 4 capsule, Rfl: 0 No current facility-administered medications for this visit.  Facility-Administered Medications Ordered in Other Visits:    0.9 %  sodium chloride infusion, , Intravenous, Continuous, Miner Koral, Rudell Cobb, MD, Stopped at 08/05/14 1710  Allergies: No Known Allergies  Past Medical History, Surgical history, Social history, and Family History were reviewed and updated.  Review of Systems: Review of Systems  Constitutional:  Positive for malaise/fatigue.  HENT:  Positive for congestion.   Eyes: Negative.   Respiratory:  Positive for shortness of breath.   Cardiovascular:  Positive for palpitations.  Gastrointestinal: Negative.   Genitourinary: Negative.   Musculoskeletal:  Positive for back pain and myalgias.  Skin: Negative.   Neurological:  Positive for tingling.  Endo/Heme/Allergies: Negative.   Psychiatric/Behavioral: Negative.      Physical Exam:  height is 6' 2.02" (1.88 m) and weight is 176 lb (79.8 kg). His oral temperature is 98 F (36.7 C). His blood pressure is 106/41 (abnormal) and his pulse is 72. His respiration is 18  and oxygen saturation is 100%.   Wt Readings from Last 3 Encounters:  07/01/21 176 lb (79.8 kg)  05/23/21 170 lb (77.1 kg)  12/24/20 173 lb (78.5 kg)     Physical Exam Vitals reviewed.  HENT:     Head: Normocephalic and atraumatic.  Eyes:     Pupils: Pupils are equal, round, and reactive to light.  Cardiovascular:     Rate and Rhythm: Normal rate and regular rhythm.     Heart sounds: Normal heart sounds.  Pulmonary:     Effort: Pulmonary effort is normal.     Breath sounds: Normal breath sounds.  Abdominal:     General: Bowel sounds are normal.     Palpations: Abdomen is soft.  Musculoskeletal:         General: No tenderness or deformity. Normal range of motion.     Cervical back: Normal range of motion.  Lymphadenopathy:     Cervical: No cervical adenopathy.  Skin:    General: Skin is warm and dry.     Findings: No erythema or rash.  Neurological:     Mental Status: He is alert and oriented to person, place, and time.  Psychiatric:        Behavior: Behavior normal.        Thought Content: Thought content normal.        Judgment: Judgment normal.      Lab Results  Component Value Date   WBC 14.8 (H) 07/01/2021   HGB 11.6 (L) 07/01/2021   HCT 37.1 (L) 07/01/2021   MCV 89.6 07/01/2021   PLT 190 07/01/2021     Chemistry      Component Value Date/Time   NA 137 07/01/2021 1003   NA 143 12/06/2016 1156   NA 139 08/02/2016 1146   K 4.5 07/01/2021 1003   K 4.2 12/06/2016 1156   K 4.7 08/02/2016 1146   CL 103 07/01/2021 1003   CL 107 12/06/2016 1156   CO2 25 07/01/2021 1003   CO2 28 12/06/2016 1156   CO2 24 08/02/2016 1146   BUN 35 (H) 07/01/2021 1003   BUN 29 (H) 12/06/2016 1156   BUN 34.3 (H) 08/02/2016 1146   CREATININE 1.86 (H) 07/01/2021 1003   CREATININE 2.1 (H) 12/06/2016 1156   CREATININE 1.7 (H) 08/02/2016 1146      Component Value Date/Time   CALCIUM 9.0 07/01/2021 1003   CALCIUM 9.1 12/06/2016 1156   CALCIUM 9.5 08/02/2016 1146   ALKPHOS 94 07/01/2021 1003   ALKPHOS 108 (H) 12/06/2016 1156   ALKPHOS 115 08/02/2016 1146   AST 12 (L) 07/01/2021 1003   AST 14 08/02/2016 1146   ALT 10 07/01/2021 1003   ALT 18 12/06/2016 1156   ALT 9 08/02/2016 1146   BILITOT 0.6 07/01/2021 1003   BILITOT 0.41 08/02/2016 1146      Impression and Plan:  Mr. [name] is 81 year old man with IgA kappa smoldering myeloma.  Again, I am a little bit worried about the light chains going up.  I also noted that his creatinine is also up a little bit.  This, might indicate that we have to think about treatment on him.  We may have to think about doing a erythropoietin  level on him.  He will do another 24-hour urine on him today.  If we find that the Kappa light chain is higher, I think we will have to embark upon therapy for him.    I probably would consider doing another bone  marrow test on him.  We would probably need to get cytogenetics.  I will probably have to get him back in 2 or 3 months now.  Again things might be a little more active and wanted to make sure that we follow-up closely.  We will see what the urinalysis shows.  He is afebrile today.  His white cell count is on the higher side.  I would try to hold on antibiotics as he looks pretty stable right now.        [name], MD 6/21/202311:00 AM

## 2021-07-02 ENCOUNTER — Telehealth: Payer: Self-pay | Admitting: *Deleted

## 2021-07-02 LAB — IGG, IGA, IGM
IgA: 991 mg/dL — ABNORMAL HIGH (ref 61–437)
IgG (Immunoglobin G), Serum: 879 mg/dL (ref 603–1613)
IgM (Immunoglobulin M), Srm: 63 mg/dL (ref 15–143)

## 2021-07-02 LAB — KAPPA/LAMBDA LIGHT CHAINS
Kappa free light chain: 125.4 mg/L — ABNORMAL HIGH (ref 3.3–19.4)
Kappa, lambda light chain ratio: 4.31 — ABNORMAL HIGH (ref 0.26–1.65)
Lambda free light chains: 29.1 mg/L — ABNORMAL HIGH (ref 5.7–26.3)

## 2021-07-02 LAB — URINE CULTURE: Culture: NO GROWTH

## 2021-07-02 NOTE — Telephone Encounter (Signed)
Per Dr. Marin Olp, I called patient's wife and informed her that he does not have a UTI. The UA and culture were negative. She verbalized understanding.

## 2021-07-03 LAB — UPEP/UIFE/LIGHT CHAINS/TP, 24-HR UR
% BETA, Urine: 32.1 %
ALPHA 1 URINE: 3.8 %
Albumin, U: 46 %
Alpha 2, Urine: 6.4 %
Free Kappa Lt Chains,Ur: 281.12 mg/L — ABNORMAL HIGH (ref 1.17–86.46)
Free Kappa/Lambda Ratio: 30.16 — ABNORMAL HIGH (ref 1.83–14.26)
Free Lambda Lt Chains,Ur: 9.32 mg/L (ref 0.27–15.21)
GAMMA GLOBULIN URINE: 11.6 %
M-SPIKE %, Urine: 19.7 % — ABNORMAL HIGH
M-Spike, Mg/24 Hr: 40 mg/24 hr — ABNORMAL HIGH
Total Protein, Urine-Ur/day: 203 mg/24 hr — ABNORMAL HIGH (ref 30–150)
Total Protein, Urine: 14.5 mg/dL
Total Volume: 1400

## 2021-07-03 LAB — ERYTHROPOIETIN: Erythropoietin: 4.2 m[IU]/mL (ref 2.6–18.5)

## 2021-07-07 LAB — PROTEIN ELECTROPHORESIS, SERUM, WITH REFLEX
A/G Ratio: 1.2 (ref 0.7–1.7)
Albumin ELP: 3.6 g/dL (ref 2.9–4.4)
Alpha-1-Globulin: 0.2 g/dL (ref 0.0–0.4)
Alpha-2-Globulin: 0.6 g/dL (ref 0.4–1.0)
Beta Globulin: 1.3 g/dL (ref 0.7–1.3)
Gamma Globulin: 0.8 g/dL (ref 0.4–1.8)
Globulin, Total: 2.9 g/dL (ref 2.2–3.9)
M-Spike, %: 0.7 g/dL — ABNORMAL HIGH
SPEP Interpretation: 0
Total Protein ELP: 6.5 g/dL (ref 6.0–8.5)

## 2021-07-07 LAB — IMMUNOFIXATION REFLEX, SERUM
IgA: 1053 mg/dL — ABNORMAL HIGH (ref 61–437)
IgG (Immunoglobin G), Serum: 955 mg/dL (ref 603–1613)
IgM (Immunoglobulin M), Srm: 70 mg/dL (ref 15–143)

## 2021-07-10 ENCOUNTER — Telehealth: Payer: Self-pay | Admitting: Pharmacy Technician

## 2021-07-10 ENCOUNTER — Other Ambulatory Visit: Payer: Self-pay

## 2021-07-10 ENCOUNTER — Encounter: Payer: Self-pay | Admitting: Hematology & Oncology

## 2021-07-10 ENCOUNTER — Inpatient Hospital Stay (HOSPITAL_BASED_OUTPATIENT_CLINIC_OR_DEPARTMENT_OTHER): Payer: Medicare Other | Admitting: Hematology & Oncology

## 2021-07-10 ENCOUNTER — Telehealth: Payer: Self-pay | Admitting: Pharmacist

## 2021-07-10 ENCOUNTER — Encounter: Payer: Self-pay | Admitting: *Deleted

## 2021-07-10 ENCOUNTER — Other Ambulatory Visit (HOSPITAL_COMMUNITY): Payer: Self-pay

## 2021-07-10 VITALS — BP 128/58 | HR 59 | Temp 97.5°F | Resp 18 | Ht 74.0 in | Wt 176.0 lb

## 2021-07-10 DIAGNOSIS — N289 Disorder of kidney and ureter, unspecified: Secondary | ICD-10-CM | POA: Diagnosis not present

## 2021-07-10 DIAGNOSIS — D472 Monoclonal gammopathy: Secondary | ICD-10-CM

## 2021-07-10 DIAGNOSIS — M549 Dorsalgia, unspecified: Secondary | ICD-10-CM | POA: Diagnosis not present

## 2021-07-10 DIAGNOSIS — R5383 Other fatigue: Secondary | ICD-10-CM | POA: Diagnosis not present

## 2021-07-10 DIAGNOSIS — Z7982 Long term (current) use of aspirin: Secondary | ICD-10-CM | POA: Diagnosis not present

## 2021-07-10 MED ORDER — LENALIDOMIDE 10 MG PO CAPS: 10.0000 mg | ORAL_CAPSULE | Freq: Every day | ORAL | 0 refills | Status: DC

## 2021-07-10 MED ORDER — LENALIDOMIDE 15 MG PO CAPS
15.0000 mg | ORAL_CAPSULE | Freq: Every day | ORAL | 0 refills | Status: DC
  Filled 2021-07-10: qty 21, 21d supply, fill #0

## 2021-07-10 MED ORDER — FAMCICLOVIR 250 MG PO TABS: 250.0000 mg | ORAL_TABLET | Freq: Every day | ORAL | 12 refills | Status: DC

## 2021-07-10 NOTE — Telephone Encounter (Signed)
Oral Oncology Patient Advocate Encounter   Received notification from [employer] that prior authorization for Lenalidomide is required.   PA submitted on CoverMyMeds Key [id number] Status is pending   Oral Oncology Clinic will continue to follow.  [place] Patient [place] Phone [phone] Fax [phone] 07/10/2021 1:48 PM

## 2021-07-10 NOTE — Telephone Encounter (Signed)
Oral Oncology Patient Advocate Encounter  Prior Authorization for Lenalidomide has been approved.    PA# [id number] Effective dates: 07/10/21 through 01/10/22  Patients co-pay is $3761.59  Oral Oncology Clinic will continue to follow.   [place] Patient [place] Phone [phone] Fax [phone] 07/10/2021 1:57 PM

## 2021-07-10 NOTE — Progress Notes (Signed)
Hematology and Oncology Follow Up Visit  KEVONTE VANECEK [id number] 14-May-1940 81 y.o. 07/10/2021   Principle Diagnosis:  IgA kappa  myeloma  Current Therapy:   Velcade/Revlimid/Decadron -- start cycle #1 on 07/27/2021     Interim History:  Mr. Pastorino is back for follow-up.  Unfortunately, I think we would have to start him on treatment now.  He did his 24-hour urine.  His Kappa light chain is certainly a lot higher.  The Kappa light chain is now up to 281 mg/L.  A year ago is 114 mg/L.  The M spike in the serum is now 0.7g/dL.  His IgA level is also gone up.  I think was 1300 mg/dL.  Again I just do not want him to run into problems with renal failure.  As such, I think that getting him on treatment with Velcade/Revlimid/Decadron would be a good idea.  I also would like to think another bone marrow biopsy would be necessary.  His last one was 5 years ago.  I do need another one so we can check for chromosomes and FISH abnormalities.   I will also get a bone survey on him.  Currently, I would say that his performance status is probably ECOG 1.       Medications:  Current Outpatient Medications:    amLODipine (NORVASC) 5 MG tablet, TAKE 1 TABLET BY MOUTH  DAILY, Disp: 90 tablet, Rfl: 3   gabapentin (NEURONTIN) 300 MG capsule, TAKE ONE CAPSULE BY MOUTH DAILY WITH BREAKFAST, 1 CAPSULE WITH DINNER AND 2 CAPSULES AT BEDTIME, Disp: 360 capsule, Rfl: 4   losartan (COZAAR) 50 MG tablet, TAKE 1 TABLET BY MOUTH  DAILY, Disp: 90 tablet, Rfl: 3   Multiple Vitamins-Minerals (PRESERVISION AREDS 2 PO), Take by mouth daily at 6 (six) AM., Disp: , Rfl:    amoxicillin (AMOXIL) 500 MG capsule, Take four tablets (2000 mg) total one hour prior to dental procedure. (Patient not taking: Reported on 07/01/2021), Disp: 4 capsule, Rfl: 0 No current facility-administered medications for this visit.  Facility-Administered Medications Ordered in Other Visits:    0.9 %  sodium chloride infusion, , Intravenous,  Continuous, Elize Pinon, Rudell Cobb, MD, Stopped at 08/05/14 1710  Allergies: No Known Allergies  Past Medical History, Surgical history, Social history, and Family History were reviewed and updated.  Review of Systems: Review of Systems  Constitutional:  Positive for malaise/fatigue.  HENT:  Positive for congestion.   Eyes: Negative.   Respiratory:  Positive for shortness of breath.   Cardiovascular:  Positive for palpitations.  Gastrointestinal: Negative.   Genitourinary: Negative.   Musculoskeletal:  Positive for back pain and myalgias.  Skin: Negative.   Neurological:  Positive for tingling.  Endo/Heme/Allergies: Negative.   Psychiatric/Behavioral: Negative.      Physical Exam:  height is _0  (1.88 m) and weight is 176 lb (79.8 kg). His oral temperature is 97.5 F (36.4 C) (abnormal). His blood pressure is 128/58 (abnormal) and his pulse is 59 (abnormal). His respiration is 18 and oxygen saturation is 99%.   Wt Readings from Last 3 Encounters:  07/10/21 176 lb (79.8 kg)  07/01/21 176 lb (79.8 kg)  05/23/21 170 lb (77.1 kg)     Physical Exam Vitals reviewed.  HENT:     Head: Normocephalic and atraumatic.  Eyes:     Pupils: Pupils are equal, round, and reactive to light.  Cardiovascular:     Rate and Rhythm: Normal rate and regular rhythm.     Heart sounds:  Normal heart sounds.  Pulmonary:     Effort: Pulmonary effort is normal.     Breath sounds: Normal breath sounds.  Abdominal:     General: Bowel sounds are normal.     Palpations: Abdomen is soft.  Musculoskeletal:        General: No tenderness or deformity. Normal range of motion.     Cervical back: Normal range of motion.  Lymphadenopathy:     Cervical: No cervical adenopathy.  Skin:    General: Skin is warm and dry.     Findings: No erythema or rash.  Neurological:     Mental Status: He is alert and oriented to person, place, and time.  Psychiatric:        Behavior: Behavior normal.        Thought  Content: Thought content normal.        Judgment: Judgment normal.      Lab Results  Component Value Date   WBC 14.8 (H) 07/01/2021   HGB 11.6 (L) 07/01/2021   HCT 37.1 (L) 07/01/2021   MCV 89.6 07/01/2021   PLT 190 07/01/2021     Chemistry      Component Value Date/Time   NA 137 07/01/2021 1003   NA 143 12/06/2016 1156   NA 139 08/02/2016 1146   K 4.5 07/01/2021 1003   K 4.2 12/06/2016 1156   K 4.7 08/02/2016 1146   CL 103 07/01/2021 1003   CL 107 12/06/2016 1156   CO2 25 07/01/2021 1003   CO2 28 12/06/2016 1156   CO2 24 08/02/2016 1146   BUN 35 (H) 07/01/2021 1003   BUN 29 (H) 12/06/2016 1156   BUN 34.3 (H) 08/02/2016 1146   CREATININE 1.86 (H) 07/01/2021 1003   CREATININE 2.1 (H) 12/06/2016 1156   CREATININE 1.7 (H) 08/02/2016 1146      Component Value Date/Time   CALCIUM 9.0 07/01/2021 1003   CALCIUM 9.1 12/06/2016 1156   CALCIUM 9.5 08/02/2016 1146   ALKPHOS 94 07/01/2021 1003   ALKPHOS 108 (H) 12/06/2016 1156   ALKPHOS 115 08/02/2016 1146   AST 12 (L) 07/01/2021 1003   AST 14 08/02/2016 1146   ALT 10 07/01/2021 1003   ALT 18 12/06/2016 1156   ALT 9 08/02/2016 1146   BILITOT 0.6 07/01/2021 1003   BILITOT 0.41 08/02/2016 1146      Impression and Plan:  Mr. Rainford is 81 year old man with IgA kappa myeloma.  I would consider him as myeloma now.  Again, his kappa light chains are going up.  He does have little bit of renal insufficiency.  This might be just from age.  Also, it might be from the light chains.  I do think now is the time that we treat him.  Had a long talk with he and his wife.  I think that Velcade/Revlimid/Decadron would be a very good option for him.  I will send in Famvir to help prevent shingles with the Velcade.  Have I told him to take 1 full dose aspirin daily to help prevent with thromboembolic disease with the Revlimid.  We will also get another bone marrow test on him so we can see there is any change with the  cytogenetics.  I think that treating him 3 weeks on and 1 week off would be a very reasonable way to treat him.  I have been following him now for about 7 years.  He is done incredibly well.  However, now I think at the time  that we do get him on treatment.  He and his wife are in agreement.  We will assess his light chains monthly.  We probably will do a 24-hour urine on him in about 3 months.  If we do see that he is responding, then maybe we can pull back a little bit on his protocol so that it is not so often that has to come to the office.  We will try to get started on the week of July 17.  I will plan to see him back when he starts his second cycle in August.          [name], MD 6/30/202312:23 PM

## 2021-07-10 NOTE — Telephone Encounter (Addendum)
Oral Oncology Pharmacist Encounter  Received new prescription for Revlimid (lenalidomide) for the treatment of multiple myeloma in conjunction with bortezomib and dexamethasone, planned duration until disease progression or unacceptable drug toxicity.  CBC w/ Diff and CMP from 07/01/21 assessed, patient with Scr of 1.86 mg/dL (CrCl ~34.5 mL/min) - recommended starting dose of Revlimid is 10 mg/d for first 2 cycles in patients with CrCl 30 - 60 mL/min. Message sent to Dr. Marin Olp regarding dosing. Confirmed with Dr. Marin Olp patient will be dose reduced to 10 mg/d 3 weeks on, 1 week off. Prescription updated and [employer] authorization updated with new dose with [employer] program.   Current medication list in Epic reviewed, no relevant/significant DDIs with Revlimid identified.  Evaluated chart and no patient barriers to medication adherence noted.   Patient is required to fill through [facility]. Prescription redirected for dispensing.   Oral Oncology Clinic will continue to follow for insurance authorization, copayment issues, initial counseling and start date.  Leron Croak, PharmD, BCPS Hematology/Oncology Clinical Pharmacist [facility] [place] [phone] 07/10/2021 1:29 PM

## 2021-07-10 NOTE — Progress Notes (Signed)
START OFF PATHWAY REGIMEN - Multiple Myeloma and Other Plasma Cell Dyscrasias   [id number]:Bortezomib:   A cycle is every 21 days:     Bortezomib   **Always confirm dose/schedule in your pharmacy ordering system**  Patient Characteristics: Multiple Myeloma, Newly Diagnosed, Transplant Ineligible or Refused, Unknown or Awaiting Test Results Disease Classification: Multiple Myeloma R-ISS Staging: III Therapeutic Status: Newly Diagnosed Is Patient Eligible for Transplant<= Transplant Ineligible or Refused Risk Status: Awaiting Test Results Intent of Therapy: Non-Curative / Palliative Intent, Discussed with Patient

## 2021-07-10 NOTE — Progress Notes (Signed)
Per Dr. Marin Olp, please give him information on Velcade and Revlimid. I reviewed the materials with him and his wife on both of these medications. I also enrolled him with [employer] Risk Management for Revlimid. All questions answered at the end of their visit. Instructed them to call or use MyChart if they had any questions or concerns that arouse after today's visit. They verbalized understanding.

## 2021-07-13 ENCOUNTER — Telehealth: Payer: Self-pay | Admitting: Pharmacy Technician

## 2021-07-13 ENCOUNTER — Other Ambulatory Visit: Payer: Self-pay

## 2021-07-13 DIAGNOSIS — Z789 Other specified health status: Secondary | ICD-10-CM

## 2021-07-13 DIAGNOSIS — D472 Monoclonal gammopathy: Secondary | ICD-10-CM

## 2021-07-13 MED ORDER — ACYCLOVIR 400 MG PO TABS: 400.0000 mg | ORAL_TABLET | Freq: Two times a day (BID) | ORAL | 3 refills | Status: DC

## 2021-07-13 NOTE — Telephone Encounter (Signed)
Oral Oncology Patient Advocate Encounter  Was successful in securing patient a $12,000 grant from [facility] to provide copayment coverage for Revlimid.  This will keep the out of pocket expense at $0.     [employer] ID: [id number]   The billing information is as follows and has been shared with [employer].    RxBin: [id number] PCN: [id number] Member ID: [id number] Group ID: [id number] Dates of Eligibility: 06/13/21 through 06/13/22  Fund:  [place] Patient [place] Phone [phone] Fax [phone] 07/13/2021 12:35 PM

## 2021-07-15 NOTE — Telephone Encounter (Signed)
Oral Chemotherapy Pharmacist Encounter   Spoke with patient's today to follow up regarding patient's oral chemotherapy medication: Revlimid (lenalidomide)  Patient is scheduled to receive Revlimid in the mail this week from [facility]. Confirmed with Dr. Marin Olp that he wants patient to wait until 08/11/21 to start Revlimid. Patient's wife aware and will store Revlimid in safe area away from children, pets and moisture for the time being.   I will follow up with patient and patient's wife the last week of July to review specifics regarding Revlimid dosing, administration and side effects. [name] is aware of this.   Patient and patient's family know to call the office with questions or concerns. Oral chemotherapy clinic phone number provided to patient's wife.   Leron Croak, PharmD, BCPS Hematology/Oncology Clinical Pharmacist [facility] [place] [phone] 07/15/2021 10:53 AM

## 2021-07-17 ENCOUNTER — Ambulatory Visit (HOSPITAL_COMMUNITY)
Admission: RE | Admit: 2021-07-17 | Discharge: 2021-07-17 | Disposition: A | Discharge status: Home / Self Care | Payer: Medicare Other | Source: Ambulatory Visit | Attending: Hematology & Oncology | Admitting: Hematology & Oncology

## 2021-07-17 DIAGNOSIS — C9000 Multiple myeloma not having achieved remission: Secondary | ICD-10-CM | POA: Diagnosis not present

## 2021-07-17 DIAGNOSIS — D472 Monoclonal gammopathy: Secondary | ICD-10-CM | POA: Diagnosis not present

## 2021-07-20 ENCOUNTER — Encounter: Payer: Self-pay | Admitting: *Deleted

## 2021-08-03 ENCOUNTER — Other Ambulatory Visit: Payer: Self-pay

## 2021-08-04 NOTE — Progress Notes (Signed)
Pharmacist Chemotherapy Monitoring - Initial Assessment    Anticipated start date: 08/11/21   The following has been reviewed per standard work regarding the patient's treatment regimen: The patient's diagnosis, treatment plan and drug doses, and organ/hematologic function Lab orders and baseline tests specific to treatment regimen  The treatment plan start date, drug sequencing, and pre-medications Prior authorization status  Patient's documented medication list, including drug-drug interaction screen and prescriptions for anti-emetics and supportive care specific to the treatment regimen The drug concentrations, fluid compatibility, administration routes, and timing of the medications to be used The patient's access for treatment and lifetime cumulative dose history, if applicable  The patient's medication allergies and previous infusion related reactions, if applicable   Changes made to treatment plan:  treatment plan date  Follow up needed:  N/A   Amberli Ruegg, Jacqlyn Larsen, [facility], 08/04/2021  12:42 PM

## 2021-08-04 NOTE — Telephone Encounter (Signed)
Oral Chemotherapy Pharmacist Encounter  I spoke with patient's wife for overview of: Revlimid for the treatment of multiple myeloma in conjunction with Velcade and dexamethasone, planned duration until disease progression or unacceptable drug toxicity.  Counseled on administration, dosing, side effects, monitoring, drug-food interactions, safe handling, storage, and disposal.  Patient will take Revlimid 110m capsules, 1 capsule by mouth once daily, without regard to food, with a full glass of water.  Revlimid will be given 21 days on, 7 days off, repeat every 28 days.  Revlimid start date: 08/11/21 PM  Adverse effects of Revlimid include but are not limited to: nausea, constipation, diarrhea, abdominal pain, rash, fatigue, and decreased blood counts.    Reviewed importance of keeping a medication schedule and plan for any missed doses. No barriers to medication adherence identified.  Medication reconciliation performed and medication/allergy list updated.  Patient has already picked up acyclovir prescription.  Patient's wife counseled on importance of daily aspirin 325 mg for VTE prophylaxis.  Insurance authorization for Revlimid has been obtained. [facility] a [facility] medication. This has already been delivered to patient's home.  All questions answered.  Ms. GTallvoiced understanding and appreciation.   Medication education handout placed in mail for patient. Patient and patient's wife know to call the office with questions or concerns. Oral Chemotherapy Clinic phone number provided.   RLeron Croak PharmD, BCPS, BIns[facility] LLCHematology/Oncology Clinical Pharmacist W[facility]le[facility] [phone]/25/2023 11:42 AM

## 2021-08-06 ENCOUNTER — Other Ambulatory Visit: Payer: Self-pay | Admitting: *Deleted

## 2021-08-06 ENCOUNTER — Other Ambulatory Visit: Payer: Self-pay | Admitting: Radiology

## 2021-08-06 DIAGNOSIS — D472 Monoclonal gammopathy: Secondary | ICD-10-CM

## 2021-08-06 MED ORDER — LENALIDOMIDE 10 MG PO CAPS: 10.0000 mg | ORAL_CAPSULE | Freq: Every day | ORAL | 0 refills | Status: DC

## 2021-08-07 ENCOUNTER — Encounter (HOSPITAL_COMMUNITY): Payer: Self-pay

## 2021-08-07 ENCOUNTER — Other Ambulatory Visit: Payer: Self-pay

## 2021-08-07 ENCOUNTER — Ambulatory Visit (HOSPITAL_COMMUNITY)
Admission: RE | Admit: 2021-08-07 | Discharge: 2021-08-07 | Disposition: A | Discharge status: Home / Self Care | Payer: Medicare Other | Source: Ambulatory Visit | Attending: Hematology & Oncology | Admitting: Hematology & Oncology

## 2021-08-07 DIAGNOSIS — E785 Hyperlipidemia, unspecified: Secondary | ICD-10-CM | POA: Diagnosis not present

## 2021-08-07 DIAGNOSIS — D472 Monoclonal gammopathy: Secondary | ICD-10-CM | POA: Insufficient documentation

## 2021-08-07 DIAGNOSIS — D649 Anemia, unspecified: Secondary | ICD-10-CM | POA: Diagnosis not present

## 2021-08-07 DIAGNOSIS — C9000 Multiple myeloma not having achieved remission: Secondary | ICD-10-CM | POA: Insufficient documentation

## 2021-08-07 DIAGNOSIS — Z0389 Encounter for observation for other suspected diseases and conditions ruled out: Secondary | ICD-10-CM | POA: Diagnosis not present

## 2021-08-07 DIAGNOSIS — Z1379 Encounter for other screening for genetic and chromosomal anomalies: Secondary | ICD-10-CM | POA: Diagnosis not present

## 2021-08-07 DIAGNOSIS — Z01812 Encounter for preprocedural laboratory examination: Secondary | ICD-10-CM | POA: Diagnosis not present

## 2021-08-07 DIAGNOSIS — N189 Chronic kidney disease, unspecified: Secondary | ICD-10-CM | POA: Diagnosis not present

## 2021-08-07 DIAGNOSIS — I129 Hypertensive chronic kidney disease with stage 1 through stage 4 chronic kidney disease, or unspecified chronic kidney disease: Secondary | ICD-10-CM | POA: Insufficient documentation

## 2021-08-07 DIAGNOSIS — D6489 Other specified anemias: Secondary | ICD-10-CM | POA: Diagnosis not present

## 2021-08-07 DIAGNOSIS — M8580 Other specified disorders of bone density and structure, unspecified site: Secondary | ICD-10-CM | POA: Insufficient documentation

## 2021-08-07 DIAGNOSIS — D4989 Neoplasm of unspecified behavior of other specified sites: Secondary | ICD-10-CM | POA: Diagnosis not present

## 2021-08-07 LAB — CBC WITH DIFFERENTIAL/PLATELET
Abs Immature Granulocytes: 0.03 10*3/uL (ref 0.00–0.07)
Basophils Absolute: 0.1 10*3/uL (ref 0.0–0.1)
Basophils Relative: 1 %
Eosinophils Absolute: 0.2 10*3/uL (ref 0.0–0.5)
Eosinophils Relative: 3 %
HCT: 38.5 % — ABNORMAL LOW (ref 39.0–52.0)
Hemoglobin: 11.9 g/dL — ABNORMAL LOW (ref 13.0–17.0)
Immature Granulocytes: 0 %
Lymphocytes Relative: 21 %
Lymphs Abs: 1.5 10*3/uL (ref 0.7–4.0)
MCH: 27.9 pg (ref 26.0–34.0)
MCHC: 30.9 g/dL (ref 30.0–36.0)
MCV: 90.4 fL (ref 80.0–100.0)
Monocytes Absolute: 0.6 10*3/uL (ref 0.1–1.0)
Monocytes Relative: 8 %
Neutro Abs: 5 10*3/uL (ref 1.7–7.7)
Neutrophils Relative %: 67 %
Platelets: 219 10*3/uL (ref 150–400)
RBC: 4.26 MIL/uL (ref 4.22–5.81)
RDW: 14.2 % (ref 11.5–15.5)
WBC: 7.3 10*3/uL (ref 4.0–10.5)
nRBC: 0 % (ref 0.0–0.2)

## 2021-08-07 MED ORDER — FENTANYL CITRATE (PF) 100 MCG/2ML IJ SOLN
INTRAMUSCULAR | Status: AC
  Filled 2021-08-07: qty 2

## 2021-08-07 MED ORDER — SODIUM CHLORIDE 0.9 % IV SOLN: INTRAVENOUS | Status: DC

## 2021-08-07 MED ORDER — MIDAZOLAM HCL 2 MG/2ML IJ SOLN
INTRAMUSCULAR | Status: AC | PRN
  Administered 2021-08-07: 1 mg via INTRAVENOUS

## 2021-08-07 MED ORDER — FENTANYL CITRATE (PF) 100 MCG/2ML IJ SOLN
INTRAMUSCULAR | Status: AC | PRN
  Administered 2021-08-07: 50 ug via INTRAVENOUS

## 2021-08-07 MED ORDER — MIDAZOLAM HCL 2 MG/2ML IJ SOLN
INTRAMUSCULAR | Status: AC
  Filled 2021-08-07: qty 2

## 2021-08-07 MED ORDER — NALOXONE HCL 0.4 MG/ML IJ SOLN
INTRAMUSCULAR | Status: AC
  Filled 2021-08-07: qty 1

## 2021-08-07 MED ORDER — FENTANYL CITRATE (PF) 100 MCG/2ML IJ SOLN
INTRAMUSCULAR | Status: AC | PRN
  Administered 2021-08-07: 25 ug via INTRAVENOUS

## 2021-08-07 MED ORDER — FLUMAZENIL 0.5 MG/5ML IV SOLN
INTRAVENOUS | Status: AC
  Filled 2021-08-07: qty 5

## 2021-08-07 NOTE — H&P (Addendum)
Referring Physician(s): Ennever,Peter R  Supervising Physician: Aletta Edouard  Patient Status:  [facility] OP  Chief Complaint:  "I'm here for a bone marrow biopsy"  Subjective: Patient known to radiology service from cerebral arteriogram in 2001.  He has a past medical history of colon polyps, hyperlipidemia, CKD, gout, hypertension, nephrolithiasis, osteopenia, prior subarachnoid hemorrhage and now with progressive IgA kappa myeloma. He presents today for CT-guided bone marrow biopsy for further evaluation.  He currently denies fever, headache, chest pain, dyspnea, cough, abdominal/back pain, nausea, vomiting or bleeding.  He is hard of hearing.  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  Medications: Prior to Admission medications   Medication Sig Start Date End Date Taking? Authorizing Provider  acyclovir (ZOVIRAX) 400 MG tablet Take 1 tablet (400 mg total) by mouth 2 (two) times daily. 07/13/21   [name], MD  amLODipine (NORVASC) 5 MG tablet TAKE 1 TABLET BY MOUTH  DAILY 12/16/20   Burchette, Alinda Sierras, MD  amoxicillin (AMOXIL) 500 MG capsule Take four tablets (2000 mg) total one hour prior to dental procedure. Patient not taking: Reported on 07/01/2021 10/29/19   Celso Amy, NP  aspirin 325 MG tablet Take 325 mg by mouth daily.    [provider]  gabapentin (NEURONTIN) 300 MG capsule TAKE ONE CAPSULE BY MOUTH DAILY WITH BREAKFAST, 1 CAPSULE WITH DINNER AND 2 CAPSULES AT BEDTIME 06/04/21   [name], MD  lenalidomide (REVLIMID) 10 MG capsule Take 1 capsule (10 mg total) by mouth daily. Take for 21  days on, 7 days off. Repeat every 28 days. [employer] Auth # [id number] 08/06/21   [name], MD  losartan (COZAAR) 50 MG tablet TAKE 1 TABLET BY MOUTH  DAILY 12/16/20   Burchette, Alinda Sierras, MD  Multiple Vitamins-Minerals (PRESERVISION AREDS 2 PO) Take by mouth daily at 6 (six) AM.    [provider]     Vital Signs: BP 128/67 (BP Location: Right Arm)   Pulse (!) 51   Temp 97.6 F (36.4 C) (Oral)   Resp 15   SpO2 97%   Physical Exam awake, alert.  Chest clear to auscultation bilaterally.  Heart with slightly bradycardic but regular rhythm.  Abdomen soft, positive bowel sounds, nontender.  No lower extremity edema.  Imaging: No results found.  Labs:  CBC: Recent Labs    12/24/20 1019 07/01/21 1003  WBC 8.1 14.8*  HGB 12.6* 11.6*  HCT 40.3 37.1*  PLT 224 190    COAGS: No results for input(s): "INR", "APTT" in the last 8760 hours.  BMP: Recent Labs    12/24/20 1019 07/01/21 1003  NA 140 137  K 4.7 4.5  CL 105 103  CO2 29 25  GLUCOSE 100* 132*  BUN 27* 35*  CALCIUM 9.4 9.0  CREATININE 1.61* 1.86*  GFRNONAA 43* 36*    LIVER FUNCTION TESTS: Recent Labs    12/24/20 1019 07/01/21 1003  BILITOT 0.4 0.6  AST 11* 12*  ALT 9 10  ALKPHOS 105 94  PROT 7.0 7.1  ALBUMIN 4.0 3.9    Assessment and Plan: Patient known to radiology service from cerebral arteriogram in 2001.  He has a past medical history of colon polyps, hyperlipidemia, CKD, gout, hypertension, nephrolithiasis, osteopenia, prior subarachnoid hemorrhage and now with progressive IgA kappa myeloma. He presents today for CT-guided bone marrow biopsy for further evaluation. Risks and benefits of procedure was discussed with the patient  including, but not limited to bleeding, infection, damage to adjacent structures or low yield requiring additional tests.  All of the questions were answered and there is agreement to proceed.  Consent signed and in chart.    Electronically Signed: D. Rowe Robert, PA-C 08/07/2021, 9:54 AM   I spent a total of 20 minutes at the the patient's bedside AND on the patient's hospital floor or unit, greater than 50% of which was counseling/coordinating care for CT-guided bone marrow biopsy

## 2021-08-07 NOTE — Discharge Instructions (Addendum)
Please call Interventional Radiology clinic [phone] with any questions or concerns.  You may remove your dressing and shower tomorrow.   Bone Marrow Aspiration and Bone Marrow Biopsy, Adult, Care After This sheet gives you information about how to care for yourself after your procedure. Your health care provider may also give you more specific instructions. If you have problems or questions, contact your health care provider. What can I expect after the procedure? After the procedure, it is common to have: Mild pain and tenderness. Swelling. Bruising. Follow these instructions at home: Puncture site care Follow instructions from your health care provider about how to take care of the puncture site. Make sure you: Wash your hands with soap and water before and after you change your bandage (dressing). If soap and water are not available, use hand sanitizer.  Check your puncture site every day for signs of infection. Check for: More redness, swelling, or pain. Fluid or blood. Warmth. Pus or a bad smell.   Activity Return to your normal activities as told by your health care provider. Ask your health care provider what activities are safe for you. Do not lift anything that is heavier than 10 lb (4.5 kg), or the limit that you are told, until your health care provider says that it is safe. Do not drive for 24 hours if you were given a sedative during your procedure. General instructions Take over-the-counter and prescription medicines only as told by your health care provider. Do not take baths, swim, or use a hot tub until your health care provider approves. Ask your health care provider if you may take showers. You may only be allowed to take sponge baths. If directed, put ice on the affected area. To do this: Put ice in a plastic bag. Place a towel between your skin and the bag. Leave the ice on for 20 minutes, 2-3 times a day. Keep all follow-up visits as told by your health  care provider. This is important.   Contact a health care provider if: Your pain is not controlled with medicine. You have a fever. You have more redness, swelling, or pain around the puncture site. You have fluid or blood coming from the puncture site. Your puncture site feels warm to the touch. You have pus or a bad smell coming from the puncture site. Summary After the procedure, it is common to have mild pain, tenderness, swelling, and bruising. Follow instructions from your health care provider about how to take care of the puncture site and what activities are safe for you. Take over-the-counter and prescription medicines only as told by your health care provider. Contact a health care provider if you have any signs of infection, such as fluid or blood coming from the puncture site. This information is not intended to replace advice given to you by your health care provider. Make sure you discuss any questions you have with your health care provider. Document Revised: 05/16/2018 Document Reviewed: 05/16/2018 Elsevier Patient Education  2021 [place].   Moderate Conscious Sedation, Adult, Care After This sheet gives you information about how to care for yourself after your procedure. Your health care provider may also give you more specific instructions. If you have problems or questions, contact your health care provider. What can I expect after the procedure? After the procedure, it is common to have: Sleepiness for several hours. Impaired judgment for several hours. Difficulty with balance. Vomiting if you eat too soon. Follow these instructions at home: For the time  period you were told by your health care provider: Rest. Do not participate in activities where you could fall or become injured. Do not drive or use machinery. Do not drink alcohol. Do not take sleeping pills or medicines that cause drowsiness. Do not make important decisions or sign legal documents. Do  not take care of children on your own.      Eating and drinking Follow the diet recommended by your health care provider. Drink enough fluid to keep your urine pale yellow. If you vomit: Drink water, juice, or soup when you can drink without vomiting. Make sure you have little or no nausea before eating solid foods.   General instructions Take over-the-counter and prescription medicines only as told by your health care provider. Have a responsible adult stay with you for the time you are told. It is important to have someone help care for you until you are awake and alert. Do not smoke. Keep all follow-up visits as told by your health care provider. This is important. Contact a health care provider if: You are still sleepy or having trouble with balance after 24 hours. You feel light-headed. You keep feeling nauseous or you keep vomiting. You develop a rash. You have a fever. You have redness or swelling around the IV site. Get help right away if: You have trouble breathing. You have new-onset confusion at home. Summary After the procedure, it is common to feel sleepy, have impaired judgment, or feel nauseous if you eat too soon. Rest after you get home. Know the things you should not do after the procedure. Follow the diet recommended by your health care provider and drink enough fluid to keep your urine pale yellow. Get help right away if you have trouble breathing or new-onset confusion at home. This information is not intended to replace advice given to you by your health care provider. Make sure you discuss any questions you have with your health care provider. Document Revised: 04/27/2019 Document Reviewed: 11/23/2018 Elsevier Patient Education  2021 [employer].

## 2021-08-07 NOTE — Procedures (Signed)
Interventional Radiology Procedure Note  Procedure: CT guided bone marrow aspiration and biopsy  Complications: None  EBL: < 10 mL  Findings: Aspirate and core biopsy performed of bone marrow in right iliac bone.  Plan: Bedrest supine x 1 hrs  Elany Felix T. Levada Bowersox, M.D Pager:  [phone]   

## 2021-08-10 ENCOUNTER — Other Ambulatory Visit: Payer: Self-pay

## 2021-08-10 DIAGNOSIS — D472 Monoclonal gammopathy: Secondary | ICD-10-CM

## 2021-08-10 DIAGNOSIS — H353132 Nonexudative age-related macular degeneration, bilateral, intermediate dry stage: Secondary | ICD-10-CM | POA: Diagnosis not present

## 2021-08-10 DIAGNOSIS — H348122 Central retinal vein occlusion, left eye, stable: Secondary | ICD-10-CM | POA: Diagnosis not present

## 2021-08-11 ENCOUNTER — Inpatient Hospital Stay: Payer: Medicare Other

## 2021-08-11 ENCOUNTER — Inpatient Hospital Stay: Payer: Medicare Other | Attending: Hematology & Oncology

## 2021-08-11 VITALS — BP 123/55 | HR 58 | Temp 97.4°F | Resp 18

## 2021-08-11 DIAGNOSIS — N1832 Chronic kidney disease, stage 3b: Secondary | ICD-10-CM

## 2021-08-11 DIAGNOSIS — D472 Monoclonal gammopathy: Secondary | ICD-10-CM

## 2021-08-11 DIAGNOSIS — K5792 Diverticulitis of intestine, part unspecified, without perforation or abscess without bleeding: Secondary | ICD-10-CM

## 2021-08-11 DIAGNOSIS — Z7982 Long term (current) use of aspirin: Secondary | ICD-10-CM | POA: Insufficient documentation

## 2021-08-11 DIAGNOSIS — M109 Gout, unspecified: Secondary | ICD-10-CM

## 2021-08-11 DIAGNOSIS — M549 Dorsalgia, unspecified: Secondary | ICD-10-CM | POA: Insufficient documentation

## 2021-08-11 DIAGNOSIS — R5383 Other fatigue: Secondary | ICD-10-CM | POA: Diagnosis not present

## 2021-08-11 DIAGNOSIS — E785 Hyperlipidemia, unspecified: Secondary | ICD-10-CM

## 2021-08-11 DIAGNOSIS — Z86010 Personal history of colonic polyps: Secondary | ICD-10-CM

## 2021-08-11 DIAGNOSIS — Z7961 Long term (current) use of immunomodulator: Secondary | ICD-10-CM | POA: Insufficient documentation

## 2021-08-11 DIAGNOSIS — C9000 Multiple myeloma not having achieved remission: Secondary | ICD-10-CM | POA: Diagnosis present

## 2021-08-11 DIAGNOSIS — I10 Essential (primary) hypertension: Secondary | ICD-10-CM

## 2021-08-11 DIAGNOSIS — Z8679 Personal history of other diseases of the circulatory system: Secondary | ICD-10-CM

## 2021-08-11 DIAGNOSIS — G6289 Other specified polyneuropathies: Secondary | ICD-10-CM

## 2021-08-11 LAB — CBC WITH DIFFERENTIAL (CANCER CENTER ONLY)
Abs Immature Granulocytes: 0.05 10*3/uL (ref 0.00–0.07)
Basophils Absolute: 0.1 10*3/uL (ref 0.0–0.1)
Basophils Relative: 1 %
Eosinophils Absolute: 0.2 10*3/uL (ref 0.0–0.5)
Eosinophils Relative: 2 %
HCT: 38.5 % — ABNORMAL LOW (ref 39.0–52.0)
Hemoglobin: 12.1 g/dL — ABNORMAL LOW (ref 13.0–17.0)
Immature Granulocytes: 1 %
Lymphocytes Relative: 19 %
Lymphs Abs: 1.5 10*3/uL (ref 0.7–4.0)
MCH: 28.3 pg (ref 26.0–34.0)
MCHC: 31.4 g/dL (ref 30.0–36.0)
MCV: 90 fL (ref 80.0–100.0)
Monocytes Absolute: 0.5 10*3/uL (ref 0.1–1.0)
Monocytes Relative: 7 %
Neutro Abs: 5.3 10*3/uL (ref 1.7–7.7)
Neutrophils Relative %: 70 %
Platelet Count: 218 10*3/uL (ref 150–400)
RBC: 4.28 MIL/uL (ref 4.22–5.81)
RDW: 14.1 % (ref 11.5–15.5)
WBC Count: 7.6 10*3/uL (ref 4.0–10.5)
nRBC: 0 % (ref 0.0–0.2)

## 2021-08-11 LAB — CMP (CANCER CENTER ONLY)
ALT: 8 U/L (ref 0–44)
AST: 11 U/L — ABNORMAL LOW (ref 15–41)
Albumin: 4.1 g/dL (ref 3.5–5.0)
Alkaline Phosphatase: 110 U/L (ref 38–126)
Anion gap: 7 (ref 5–15)
BUN: 25 mg/dL — ABNORMAL HIGH (ref 8–23)
CO2: 27 mmol/L (ref 22–32)
Calcium: 9.2 mg/dL (ref 8.9–10.3)
Chloride: 108 mmol/L (ref 98–111)
Creatinine: 1.64 mg/dL — ABNORMAL HIGH (ref 0.61–1.24)
GFR, Estimated: 42 mL/min — ABNORMAL LOW (ref >60)
Glucose, Bld: 136 mg/dL — ABNORMAL HIGH (ref 70–99)
Potassium: 4.8 mmol/L (ref 3.5–5.1)
Sodium: 142 mmol/L (ref 135–145)
Total Bilirubin: 0.4 mg/dL (ref 0.3–1.2)
Total Protein: 7.1 g/dL (ref 6.5–8.1)

## 2021-08-11 LAB — SURGICAL PATHOLOGY

## 2021-08-11 MED ORDER — BORTEZOMIB CHEMO SQ INJECTION 3.5 MG (2.5MG/ML)
1.3000 mg/m2 | Freq: Once | INTRAMUSCULAR | Status: AC
  Administered 2021-08-11: 2.75 mg via SUBCUTANEOUS
  Filled 2021-08-11: qty 1.1

## 2021-08-11 MED ORDER — PROCHLORPERAZINE MALEATE 10 MG PO TABS: 10.0000 mg | ORAL_TABLET | Freq: Four times a day (QID) | ORAL | 1 refills | Status: DC | PRN

## 2021-08-11 MED ORDER — ACYCLOVIR 400 MG PO TABS: 400.0000 mg | ORAL_TABLET | Freq: Two times a day (BID) | ORAL | 3 refills | Status: DC

## 2021-08-11 MED ORDER — LIDOCAINE-PRILOCAINE 2.5-2.5 % EX CREA: TOPICAL_CREAM | CUTANEOUS | 3 refills | Status: DC

## 2021-08-11 MED ORDER — ONDANSETRON HCL 8 MG PO TABS: 8.0000 mg | ORAL_TABLET | Freq: Two times a day (BID) | ORAL | 1 refills | Status: DC | PRN

## 2021-08-11 MED ORDER — PROCHLORPERAZINE MALEATE 10 MG PO TABS
10.0000 mg | ORAL_TABLET | Freq: Once | ORAL | Status: AC
  Administered 2021-08-11: 10 mg via ORAL
  Filled 2021-08-11: qty 1

## 2021-08-11 MED ORDER — LORAZEPAM 0.5 MG PO TABS: 0.5000 mg | ORAL_TABLET | Freq: Four times a day (QID) | ORAL | 0 refills | Status: DC | PRN

## 2021-08-11 NOTE — Progress Notes (Signed)
Discussed pt at home medications with pt and wife regarding Velcade injections/treatment. Pt wife verbalized understanding, all questions answered.

## 2021-08-11 NOTE — Progress Notes (Signed)
OK to treat with creat-1.64 per order of Dr. Marin Olp.

## 2021-08-12 ENCOUNTER — Encounter: Payer: Self-pay | Admitting: *Deleted

## 2021-08-13 ENCOUNTER — Encounter (HOSPITAL_COMMUNITY): Payer: Self-pay | Admitting: Hematology & Oncology

## 2021-08-17 ENCOUNTER — Other Ambulatory Visit: Payer: Self-pay | Admitting: *Deleted

## 2021-08-17 DIAGNOSIS — D472 Monoclonal gammopathy: Secondary | ICD-10-CM

## 2021-08-17 DIAGNOSIS — K5792 Diverticulitis of intestine, part unspecified, without perforation or abscess without bleeding: Secondary | ICD-10-CM

## 2021-08-17 DIAGNOSIS — M109 Gout, unspecified: Secondary | ICD-10-CM

## 2021-08-18 ENCOUNTER — Inpatient Hospital Stay: Payer: Medicare Other

## 2021-08-18 VITALS — BP 120/55 | HR 57

## 2021-08-18 DIAGNOSIS — D472 Monoclonal gammopathy: Secondary | ICD-10-CM

## 2021-08-18 DIAGNOSIS — K5792 Diverticulitis of intestine, part unspecified, without perforation or abscess without bleeding: Secondary | ICD-10-CM

## 2021-08-18 DIAGNOSIS — C9000 Multiple myeloma not having achieved remission: Secondary | ICD-10-CM | POA: Diagnosis not present

## 2021-08-18 DIAGNOSIS — M109 Gout, unspecified: Secondary | ICD-10-CM

## 2021-08-18 LAB — CBC WITH DIFFERENTIAL (CANCER CENTER ONLY)
Abs Immature Granulocytes: 0.06 10*3/uL (ref 0.00–0.07)
Basophils Absolute: 0 10*3/uL (ref 0.0–0.1)
Basophils Relative: 0 %
Eosinophils Absolute: 0.3 10*3/uL (ref 0.0–0.5)
Eosinophils Relative: 4 %
HCT: 38.6 % — ABNORMAL LOW (ref 39.0–52.0)
Hemoglobin: 12 g/dL — ABNORMAL LOW (ref 13.0–17.0)
Immature Granulocytes: 1 %
Lymphocytes Relative: 20 %
Lymphs Abs: 1.4 10*3/uL (ref 0.7–4.0)
MCH: 28 pg (ref 26.0–34.0)
MCHC: 31.1 g/dL (ref 30.0–36.0)
MCV: 90.2 fL (ref 80.0–100.0)
Monocytes Absolute: 0.5 10*3/uL (ref 0.1–1.0)
Monocytes Relative: 7 %
Neutro Abs: 4.7 10*3/uL (ref 1.7–7.7)
Neutrophils Relative %: 68 %
Platelet Count: 186 10*3/uL (ref 150–400)
RBC: 4.28 MIL/uL (ref 4.22–5.81)
RDW: 14.4 % (ref 11.5–15.5)
WBC Count: 6.9 10*3/uL (ref 4.0–10.5)
nRBC: 0 % (ref 0.0–0.2)

## 2021-08-18 LAB — CMP (CANCER CENTER ONLY)
ALT: 9 U/L (ref 0–44)
AST: 11 U/L — ABNORMAL LOW (ref 15–41)
Albumin: 4.1 g/dL (ref 3.5–5.0)
Alkaline Phosphatase: 97 U/L (ref 38–126)
Anion gap: 7 (ref 5–15)
BUN: 32 mg/dL — ABNORMAL HIGH (ref 8–23)
CO2: 28 mmol/L (ref 22–32)
Calcium: 9.1 mg/dL (ref 8.9–10.3)
Chloride: 105 mmol/L (ref 98–111)
Creatinine: 2.11 mg/dL — ABNORMAL HIGH (ref 0.61–1.24)
GFR, Estimated: 31 mL/min — ABNORMAL LOW (ref >60)
Glucose, Bld: 125 mg/dL — ABNORMAL HIGH (ref 70–99)
Potassium: 4.8 mmol/L (ref 3.5–5.1)
Sodium: 140 mmol/L (ref 135–145)
Total Bilirubin: 0.4 mg/dL (ref 0.3–1.2)
Total Protein: 6.9 g/dL (ref 6.5–8.1)

## 2021-08-18 MED ORDER — BORTEZOMIB CHEMO SQ INJECTION 3.5 MG (2.5MG/ML)
1.3000 mg/m2 | Freq: Once | INTRAMUSCULAR | Status: AC
  Administered 2021-08-18: 2.75 mg via SUBCUTANEOUS
  Filled 2021-08-18: qty 1.1

## 2021-08-18 MED ORDER — PROCHLORPERAZINE MALEATE 10 MG PO TABS
10.0000 mg | ORAL_TABLET | Freq: Once | ORAL | Status: AC
  Administered 2021-08-18: 10 mg via ORAL
  Filled 2021-08-18: qty 1

## 2021-08-18 NOTE — Progress Notes (Signed)
OK to treat with creat-2.11 per order of Dr. Marin Olp.

## 2021-08-18 NOTE — Patient Instructions (Signed)
[facility]  Discharge Instructions: Thank you for choosing [facility] to provide your oncology and hematology care.   If you have a lab appointment with the Cancer Center, please go directly to the Cancer Center and check in at the registration area.  Wear comfortable clothing and clothing appropriate for easy access to any Portacath or PICC line.   We strive to give you quality time with your provider. You may need to reschedule your appointment if you arrive late (15 or more minutes).  Arriving late affects you and other patients whose appointments are after yours.  Also, if you miss three or more appointments without notifying the office, you may be dismissed from the clinic at the provider's discretion.      For prescription refill requests, have your pharmacy contact our office and allow 72 hours for refills to be completed.    Today you received the following chemotherapy and/or immunotherapy agents velcade    To help prevent nausea and vomiting after your treatment, we encourage you to take your nausea medication as directed.  BELOW ARE SYMPTOMS THAT SHOULD BE REPORTED IMMEDIATELY: *FEVER GREATER THAN 100.4 F (38 C) OR HIGHER *CHILLS OR SWEATING *NAUSEA AND VOMITING THAT IS NOT CONTROLLED WITH YOUR NAUSEA MEDICATION *UNUSUAL SHORTNESS OF BREATH *UNUSUAL BRUISING OR BLEEDING *URINARY PROBLEMS (pain or burning when urinating, or frequent urination) *BOWEL PROBLEMS (unusual diarrhea, constipation, pain near the anus) TENDERNESS IN MOUTH AND THROAT WITH OR WITHOUT PRESENCE OF ULCERS (sore throat, sores in mouth, or a toothache) UNUSUAL RASH, SWELLING OR PAIN  UNUSUAL VAGINAL DISCHARGE OR ITCHING   Items with * indicate a potential emergency and should be followed up as soon as possible or go to the Emergency Department if any problems should occur.  Please show the CHEMOTHERAPY ALERT CARD or IMMUNOTHERAPY ALERT CARD at check-in to the  Emergency Department and triage nurse. Should you have questions after your visit or need to cancel or reschedule your appointment, please contact [facility]  [phone] and follow the prompts.  Office hours are 8:00 a.m. to 4:30 p.m. Monday - Friday. Please note that voicemails left after 4:00 p.m. may not be returned until the following business day.  We are closed weekends and major holidays. You have access to a nurse at all times for urgent questions. Please call the main number to the clinic [phone] and follow the prompts.  For any non-urgent questions, you may also contact your provider using MyChart. We now offer e-Visits for anyone 18 and older to request care online for non-urgent symptoms. For details visit [email or web address].   Also download the MyChart app! Go to the app store, search "MyChart", open the app, select [place], and log in with your MyChart username and password.  Masks are optional in the cancer centers. If you would like for your care team to wear a mask while they are taking care of you, please let them know. You may have one support person who is at least 82 years old accompany you for your appointments. 

## 2021-08-25 ENCOUNTER — Inpatient Hospital Stay: Payer: Medicare Other

## 2021-08-25 ENCOUNTER — Encounter: Payer: Self-pay | Admitting: Hematology & Oncology

## 2021-08-25 ENCOUNTER — Inpatient Hospital Stay (HOSPITAL_BASED_OUTPATIENT_CLINIC_OR_DEPARTMENT_OTHER): Payer: Medicare Other | Admitting: Hematology & Oncology

## 2021-08-25 VITALS — BP 107/46 | HR 59 | Temp 97.6°F | Resp 18 | Ht 74.5 in | Wt 176.0 lb

## 2021-08-25 DIAGNOSIS — D472 Monoclonal gammopathy: Secondary | ICD-10-CM

## 2021-08-25 DIAGNOSIS — C9000 Multiple myeloma not having achieved remission: Secondary | ICD-10-CM | POA: Diagnosis not present

## 2021-08-25 LAB — CBC WITH DIFFERENTIAL (CANCER CENTER ONLY)
Abs Immature Granulocytes: 0.04 10*3/uL (ref 0.00–0.07)
Basophils Absolute: 0 10*3/uL (ref 0.0–0.1)
Basophils Relative: 0 %
Eosinophils Absolute: 0.3 10*3/uL (ref 0.0–0.5)
Eosinophils Relative: 4 %
HCT: 37.4 % — ABNORMAL LOW (ref 39.0–52.0)
Hemoglobin: 11.8 g/dL — ABNORMAL LOW (ref 13.0–17.0)
Immature Granulocytes: 1 %
Lymphocytes Relative: 19 %
Lymphs Abs: 1.3 10*3/uL (ref 0.7–4.0)
MCH: 28.2 pg (ref 26.0–34.0)
MCHC: 31.6 g/dL (ref 30.0–36.0)
MCV: 89.5 fL (ref 80.0–100.0)
Monocytes Absolute: 0.9 10*3/uL (ref 0.1–1.0)
Monocytes Relative: 13 %
Neutro Abs: 4.5 10*3/uL (ref 1.7–7.7)
Neutrophils Relative %: 63 %
Platelet Count: 179 10*3/uL (ref 150–400)
RBC: 4.18 MIL/uL — ABNORMAL LOW (ref 4.22–5.81)
RDW: 14 % (ref 11.5–15.5)
WBC Count: 7.1 10*3/uL (ref 4.0–10.5)
nRBC: 0 % (ref 0.0–0.2)

## 2021-08-25 LAB — CMP (CANCER CENTER ONLY)
ALT: 10 U/L (ref 0–44)
AST: 12 U/L — ABNORMAL LOW (ref 15–41)
Albumin: 4.1 g/dL (ref 3.5–5.0)
Alkaline Phosphatase: 101 U/L (ref 38–126)
Anion gap: 9 (ref 5–15)
BUN: 29 mg/dL — ABNORMAL HIGH (ref 8–23)
CO2: 26 mmol/L (ref 22–32)
Calcium: 8.8 mg/dL — ABNORMAL LOW (ref 8.9–10.3)
Chloride: 105 mmol/L (ref 98–111)
Creatinine: 1.81 mg/dL — ABNORMAL HIGH (ref 0.61–1.24)
GFR, Estimated: 37 mL/min — ABNORMAL LOW (ref >60)
Glucose, Bld: 140 mg/dL — ABNORMAL HIGH (ref 70–99)
Potassium: 4.6 mmol/L (ref 3.5–5.1)
Sodium: 140 mmol/L (ref 135–145)
Total Bilirubin: 0.3 mg/dL (ref 0.3–1.2)
Total Protein: 7.3 g/dL (ref 6.5–8.1)

## 2021-08-25 LAB — LACTATE DEHYDROGENASE: LDH: 100 U/L (ref 98–192)

## 2021-08-25 MED ORDER — PROCHLORPERAZINE MALEATE 10 MG PO TABS: 10.0000 mg | ORAL_TABLET | Freq: Once | ORAL | Status: DC

## 2021-08-25 MED ORDER — BORTEZOMIB CHEMO SQ INJECTION 3.5 MG (2.5MG/ML)
1.3000 mg/m2 | Freq: Once | INTRAMUSCULAR | Status: AC
  Administered 2021-08-25: 2.75 mg via SUBCUTANEOUS
  Filled 2021-08-25: qty 1.1

## 2021-08-25 NOTE — Progress Notes (Signed)
Labs reviewed by MD ok to treat despite counts

## 2021-08-25 NOTE — Patient Instructions (Signed)
Bortezomib Injection What is this medication? BORTEZOMIB (bor TEZ oh mib) treats lymphoma. It may also be used to treat multiple myeloma, a type of bone marrow cancer. It works by blocking a protein that causes cancer cells to grow and multiply. This helps to slow or stop the spread of cancer cells. This medicine may be used for other purposes; ask your health care provider or pharmacist if you have questions. COMMON BRAND NAME(S): Velcade What should I tell my care team before I take this medication? They need to know if you have any of these conditions: Dehydration Diabetes Heart disease Liver disease Tingling of the fingers or toes or other nerve disorder An unusual or allergic reaction to bortezomib, other medications, foods, dyes, or preservatives If you or your partner are pregnant or trying to get pregnant Breastfeeding How should I use this medication? This medication is injected into a vein or under the skin. It is given by your care team in a hospital or clinic setting. Talk to your care team about the use of this medication in children. Special care may be needed. Overdosage: If you think you have taken too much of this medicine contact a poison control center or emergency room at once. NOTE: This medicine is only for you. Do not share this medicine with others. What if I miss a dose? Keep appointments for follow-up doses. It is important not to miss your dose. Call your care team if you are unable to keep an appointment. What may interact with this medication? Ketoconazole Rifampin This list may not describe all possible interactions. Give your health care provider a list of all the medicines, herbs, non-prescription drugs, or dietary supplements you use. Also tell them if you smoke, drink alcohol, or use illegal drugs. Some items may interact with your medicine. What should I watch for while using this medication? Your condition will be monitored carefully while you are  receiving this medication. You may need blood work while taking this medication. This medication may affect your coordination, reaction time, or judgment. Do not drive or operate machinery until you know how this medication affects you. Sit up or stand slowly to reduce the risk of dizzy or fainting spells. Drinking alcohol with this medication can increase the risk of these side effects. This medication may increase your risk of getting an infection. Call your care team for advice if you get a fever, chills, sore throat, or other symptoms of a cold or flu. Do not treat yourself. Try to avoid being around people who are sick. Check with your care team if you have severe diarrhea, nausea, and vomiting, or if you sweat a lot. The loss of too much body fluid may make it dangerous for you to take this medication. Talk to your care team if you may be pregnant. Serious birth defects can occur if you take this medication during pregnancy and for 7 months after the last dose. You will need a negative pregnancy test before starting this medication. Contraception is recommended while taking this medication and for 7 months after the last dose. Your care team can help you find the option that works for you. If your partner can get pregnant, use a condom during sex while taking this medication and for 4 months after the last dose. Do not breastfeed while taking this medication and for 2 months after the last dose. This medication may cause infertility. Talk to your care team if you are concerned about your fertility. What side effects   may I notice from receiving this medication? Side effects that you should report to your care team as soon as possible: Allergic reactions--skin rash, itching, hives, swelling of the face, lips, tongue, or throat Bleeding--bloody or black, tar-like stools, vomiting blood or brown material that looks like coffee grounds, red or dark brown urine, small red or purple spots on skin, unusual  bruising or bleeding Bleeding in the brain--severe headache, stiff neck, confusion, dizziness, change in vision, numbness or weakness of the face, arm, or leg, trouble speaking, trouble walking, vomiting Bowel blockage--stomach cramping, unable to have a bowel movement or pass gas, loss of appetite, vomiting Heart failure--shortness of breath, swelling of the ankles, feet, or hands, sudden weight gain, unusual weakness or fatigue Infection--fever, chills, cough, sore throat, wounds that don't heal, pain or trouble when passing urine, general feeling of discomfort or being unwell Liver injury--right upper belly pain, loss of appetite, nausea, light-colored stool, dark yellow or brown urine, yellowing skin or eyes, unusual weakness or fatigue Low blood pressure--dizziness, feeling faint or lightheaded, blurry vision Lung injury--shortness of breath or trouble breathing, cough, spitting up blood, chest pain, fever Pain, tingling, or numbness in the hands or feet Severe or prolonged diarrhea Stomach pain, bloody diarrhea, pale skin, unusual weakness or fatigue, decrease in the amount of urine, which may be signs of hemolytic uremic syndrome Sudden and severe headache, confusion, change in vision, seizures, which may be signs of posterior reversible encephalopathy syndrome (PRES) TTP--purple spots on the skin or inside the mouth, pale skin, yellowing skin or eyes, unusual weakness or fatigue, fever, fast or irregular heartbeat, confusion, change in vision, trouble speaking, trouble walking Tumor lysis syndrome (TLS)--nausea, vomiting, diarrhea, decrease in the amount of urine, dark urine, unusual weakness or fatigue, confusion, muscle pain or cramps, fast or irregular heartbeat, joint pain Side effects that usually do not require medical attention (report to your care team if they continue or are bothersome): Constipation Diarrhea Fatigue Loss of appetite Nausea This list may not describe all possible  side effects. Call your doctor for medical advice about side effects. You may report side effects to FDA at 1-800-FDA-1088. Where should I keep my medication? This medication is given in a hospital or clinic. It will not be stored at home. NOTE: This sheet is a summary. It may not cover all possible information. If you have questions about this medicine, talk to your doctor, pharmacist, or health care provider.  2023 Elsevier/Gold Standard (2021-05-27 00:00:00)  

## 2021-08-25 NOTE — Progress Notes (Signed)
Hematology and Oncology Follow Up Visit  KHALE NIGH [id number] 05/27/1940 81 y.o. 08/25/2021   Principle Diagnosis:  IgA kappa  myeloma  Current Therapy:   Velcade/Revlimid/Decadron -- s/p cycle #1 -- start on 07/27/2021     Interim History:  Mr. Glatfelter is back for follow-up.  He started his protocol.  So far, he is tolerated this pretty well.  Hopefully, we will see a nice response.  He is on low-dose Revlimid.  He has had no problems with a Revlimid.  With the Velcade, he has had no problems at the injection site.  He has had no nausea or vomiting.  He has had no cough or shortness of breath.  There has been no diarrhea.  He has had no fever.  There has been no rashes.  He has been trying to mow the yard.  I told him that he should not mow the unless is below 85 degrees.  I think that heat and humidity is just too much for him.  We did do a bone marrow biopsy on him.  This was done on 08/07/2026.  The pathology report [id number]) showed 8% plasma cells in the bone marrow.  The cytogenetics were normal.  The FISH studies were normal.  Overall, I would say his performance status is probably ECOG 1.    Medications:  Current Outpatient Medications:    acyclovir (ZOVIRAX) 400 MG tablet, Take 1 tablet (400 mg total) by mouth 2 (two) times daily., Disp: 60 tablet, Rfl: 3   amLODipine (NORVASC) 5 MG tablet, TAKE 1 TABLET BY MOUTH  DAILY, Disp: 90 tablet, Rfl: 3   aspirin 325 MG tablet, Take 325 mg by mouth daily., Disp: , Rfl:    gabapentin (NEURONTIN) 300 MG capsule, TAKE ONE CAPSULE BY MOUTH DAILY WITH BREAKFAST, 1 CAPSULE WITH DINNER AND 2 CAPSULES AT BEDTIME, Disp: 360 capsule, Rfl: 4   lenalidomide (REVLIMID) 10 MG capsule, Take 1 capsule (10 mg total) by mouth daily. Take for 21 days on, 7 days off. Repeat every 28 days. [employer] Auth # [id number], Disp: 21 capsule, Rfl: 0   losartan (COZAAR) 50 MG tablet, TAKE 1 TABLET BY MOUTH  DAILY, Disp: 90 tablet, Rfl: 3   Multiple  Vitamins-Minerals (PRESERVISION AREDS 2 PO), Take by mouth daily at 6 (six) AM., Disp: , Rfl:    prochlorperazine (COMPAZINE) 10 MG tablet, Take 1 tablet (10 mg total) by mouth every 6 (six) hours as needed (Nausea or vomiting)., Disp: 30 tablet, Rfl: 1   amoxicillin (AMOXIL) 500 MG capsule, Take four tablets (2000 mg) total one hour prior to dental procedure. (Patient not taking: Reported on 07/01/2021), Disp: 4 capsule, Rfl: 0   lidocaine-prilocaine (EMLA) cream, Apply to affected area once, Disp: 30 g, Rfl: 3   LORazepam (ATIVAN) 0.5 MG tablet, Take 1 tablet (0.5 mg total) by mouth every 6 (six) hours as needed (Nausea or vomiting). (Patient not taking: Reported on 08/25/2021), Disp: 30 tablet, Rfl: 0   ondansetron (ZOFRAN) 8 MG tablet, Take 1 tablet (8 mg total) by mouth 2 (two) times daily as needed (Nausea or vomiting). (Patient not taking: Reported on 08/25/2021), Disp: 30 tablet, Rfl: 1 No current facility-administered medications for this visit.  Facility-Administered Medications Ordered in Other Visits:    0.9 %  sodium chloride infusion, , Intravenous, Continuous, Oiva Dibari, Rudell Cobb, MD, Stopped at 08/05/14 1710  Allergies: No Known Allergies  Past Medical History, Surgical history, Social history, and Family History were reviewed and updated.  Review  of Systems: Review of Systems  Constitutional:  Positive for malaise/fatigue.  HENT:  Positive for congestion.   Eyes: Negative.   Respiratory:  Positive for shortness of breath.   Cardiovascular:  Positive for palpitations.  Gastrointestinal: Negative.   Genitourinary: Negative.   Musculoskeletal:  Positive for back pain and myalgias.  Skin: Negative.   Neurological:  Positive for tingling.  Endo/Heme/Allergies: Negative.   Psychiatric/Behavioral: Negative.      Physical Exam:  height is 6' 2.5" (1.892 m) and weight is 176 lb (79.8 kg). His oral temperature is 97.6 F (36.4 C). His blood pressure is 107/46 (abnormal) and his  pulse is 59 (abnormal). His respiration is 18 and oxygen saturation is 99%.   Wt Readings from Last 3 Encounters:  08/25/21 176 lb (79.8 kg)  07/10/21 176 lb (79.8 kg)  07/01/21 176 lb (79.8 kg)     Physical Exam Vitals reviewed.  HENT:     Head: Normocephalic and atraumatic.  Eyes:     Pupils: Pupils are equal, round, and reactive to light.  Cardiovascular:     Rate and Rhythm: Normal rate and regular rhythm.     Heart sounds: Normal heart sounds.  Pulmonary:     Effort: Pulmonary effort is normal.     Breath sounds: Normal breath sounds.  Abdominal:     General: Bowel sounds are normal.     Palpations: Abdomen is soft.  Musculoskeletal:        General: No tenderness or deformity. Normal range of motion.     Cervical back: Normal range of motion.  Lymphadenopathy:     Cervical: No cervical adenopathy.  Skin:    General: Skin is warm and dry.     Findings: No erythema or rash.  Neurological:     Mental Status: He is alert and oriented to person, place, and time.  Psychiatric:        Behavior: Behavior normal.        Thought Content: Thought content normal.        Judgment: Judgment normal.      Lab Results  Component Value Date   WBC 7.1 08/25/2021   HGB 11.8 (L) 08/25/2021   HCT 37.4 (L) 08/25/2021   MCV 89.5 08/25/2021   PLT 179 08/25/2021     Chemistry      Component Value Date/Time   NA 140 08/18/2021 0959   NA 143 12/06/2016 1156   NA 139 08/02/2016 1146   K 4.8 08/18/2021 0959   K 4.2 12/06/2016 1156   K 4.7 08/02/2016 1146   CL 105 08/18/2021 0959   CL 107 12/06/2016 1156   CO2 28 08/18/2021 0959   CO2 28 12/06/2016 1156   CO2 24 08/02/2016 1146   BUN 32 (H) 08/18/2021 0959   BUN 29 (H) 12/06/2016 1156   BUN 34.3 (H) 08/02/2016 1146   CREATININE 2.11 (H) 08/18/2021 0959   CREATININE 2.1 (H) 12/06/2016 1156   CREATININE 1.7 (H) 08/02/2016 1146      Component Value Date/Time   CALCIUM 9.1 08/18/2021 0959   CALCIUM 9.1 12/06/2016 1156    CALCIUM 9.5 08/02/2016 1146   ALKPHOS 97 08/18/2021 0959   ALKPHOS 108 (H) 12/06/2016 1156   ALKPHOS 115 08/02/2016 1146   AST 11 (L) 08/18/2021 0959   AST 14 08/02/2016 1146   ALT 9 08/18/2021 0959   ALT 18 12/06/2016 1156   ALT 9 08/02/2016 1146   BILITOT 0.4 08/18/2021 0959   BILITOT 0.41 08/02/2016  1146      Impression and Plan:  Mr. Arlington is 81 year old man with IgA kappa myeloma.  I would consider him as myeloma now.  Again, his kappa light chains are going up.  He is now on treatment.  Hopefully, we will see a nice response to the treatment.  We will have to see how his light chains look.  This is where his problem mostly is.  I happy that the bone marrow did not show a lot of myeloma in the marrow.  The chromosomes and FISH was normal which is also encouraging.  Again he is on low-dose Revlimid so I would not think that he should have problems with toxicity.  For right now, we will just plan to treat him weekly for 3 weeks on and 1 week off with the Velcade.  I will plan to see him back in 1 month for his third cycle of treatment.           [name], MD 8/15/202310:47 AM

## 2021-08-26 ENCOUNTER — Telehealth: Payer: Self-pay

## 2021-08-26 LAB — IGG, IGA, IGM
IgA: 952 mg/dL — ABNORMAL HIGH (ref 61–437)
IgG (Immunoglobin G), Serum: 948 mg/dL (ref 603–1613)
IgM (Immunoglobulin M), Srm: 68 mg/dL (ref 15–143)

## 2021-08-26 LAB — KAPPA/LAMBDA LIGHT CHAINS
Kappa free light chain: 175.7 mg/L — ABNORMAL HIGH (ref 3.3–19.4)
Kappa, lambda light chain ratio: 4.27 — ABNORMAL HIGH (ref 0.26–1.65)
Lambda free light chains: 41.1 mg/L — ABNORMAL HIGH (ref 5.7–26.3)

## 2021-08-26 NOTE — Telephone Encounter (Signed)
Pt requesting to get refill for revlimid next week. Last dose will be 08/31/2021. Patient informed we will resend new rx.

## 2021-08-27 ENCOUNTER — Encounter: Payer: Self-pay | Admitting: *Deleted

## 2021-08-31 ENCOUNTER — Telehealth: Payer: Self-pay | Admitting: *Deleted

## 2021-08-31 ENCOUNTER — Telehealth: Payer: Self-pay | Admitting: Family Medicine

## 2021-08-31 LAB — PROTEIN ELECTROPHORESIS, SERUM, WITH REFLEX
A/G Ratio: 1.1 (ref 0.7–1.7)
Albumin ELP: 3.4 g/dL (ref 2.9–4.4)
Alpha-1-Globulin: 0.2 g/dL (ref 0.0–0.4)
Alpha-2-Globulin: 0.7 g/dL (ref 0.4–1.0)
Beta Globulin: 1.2 g/dL (ref 0.7–1.3)
Gamma Globulin: 1 g/dL (ref 0.4–1.8)
Globulin, Total: 3.1 g/dL (ref 2.2–3.9)
M-Spike, %: 0.7 g/dL — ABNORMAL HIGH
SPEP Interpretation: 0
Total Protein ELP: 6.5 g/dL (ref 6.0–8.5)

## 2021-08-31 LAB — IMMUNOFIXATION REFLEX, SERUM
IgA: 1106 mg/dL — ABNORMAL HIGH (ref 61–437)
IgG (Immunoglobin G), Serum: 1022 mg/dL (ref 603–1613)
IgM (Immunoglobulin M), Srm: 73 mg/dL (ref 15–143)

## 2021-08-31 MED ORDER — PREDNISONE 20 MG PO TABS: ORAL_TABLET | ORAL | 0 refills | Status: DC

## 2021-08-31 NOTE — Telephone Encounter (Addendum)
Pt's wife called to request a refill of the   prednisoLONE acetate (PRED FORTE) 1 % ophthalmic suspension  Spouse also wanted to remind MD that Pt is on chemo, as well.  Last OV:  11/24/20 (CPE)  Please advise.  *Pt is asking that Rx be sent to: [facility] [place] - [address] AT [address] Phone:  [phone]  Fax:  [phone]

## 2021-08-31 NOTE — Telephone Encounter (Signed)
Message received from patient's wife, Tamela Oddi stating that pt is having a gout flare up and would like to know if she should contact Dr. Marin Olp or pt.'s PCP.  Dr. Marin Olp notified and would like for pt to contact his PCP regarding gout issues.  Call placed back to [facility] and notified her of above message per Dr. Marin Olp.  Doris states that she will contact pt.'s PCP.

## 2021-08-31 NOTE — Telephone Encounter (Signed)
I spoke with the patient's wife and she clarified she is requesting refill on Prednisone 20 mg as she states the patient is having a gout flare up.

## 2021-08-31 NOTE — Telephone Encounter (Signed)
Rx sent. I spoke with the patient and she stated the patient is currently on Aspirin 325 mg and she is concerned about Prednisone dosage as she states she read online that this can interact with his Aspirin dosage. I informed the patient that PCP has viewed this as Aspirin 325 is on medication list but she insisted a message be sent back to PCP to make sure he is aware.

## 2021-09-01 NOTE — Telephone Encounter (Signed)
Patients wife informed of the message below  

## 2021-09-01 NOTE — Telephone Encounter (Signed)
Left a message for the pt's wife to return my call.

## 2021-09-07 ENCOUNTER — Telehealth: Payer: Self-pay | Admitting: *Deleted

## 2021-09-07 NOTE — Telephone Encounter (Signed)
James Marks, patients wife called stating that patient has a productive cough, although it is clear, no fever.  Dr Marin Olp notified.  States to push patients treatment out one week including Revlimid to recover. Advises using Robitussin for cough and pushing fluids.  Wife notified appreciates call.

## 2021-09-07 NOTE — Telephone Encounter (Signed)
Received a second phone call from [facility], saying that patient wants to get treated tomorrow that he only has a little head cold.  This is ok with Dr Marin Olp  Message left with on AM for [facility].

## 2021-09-08 ENCOUNTER — Inpatient Hospital Stay: Payer: Medicare Other

## 2021-09-08 ENCOUNTER — Encounter: Payer: Self-pay | Admitting: Hematology & Oncology

## 2021-09-08 VITALS — BP 127/49 | HR 53 | Temp 97.9°F | Resp 18

## 2021-09-08 DIAGNOSIS — D472 Monoclonal gammopathy: Secondary | ICD-10-CM

## 2021-09-08 DIAGNOSIS — C9000 Multiple myeloma not having achieved remission: Secondary | ICD-10-CM | POA: Diagnosis not present

## 2021-09-08 LAB — CBC WITH DIFFERENTIAL (CANCER CENTER ONLY)
Abs Immature Granulocytes: 0.05 10*3/uL (ref 0.00–0.07)
Basophils Absolute: 0.2 10*3/uL — ABNORMAL HIGH (ref 0.0–0.1)
Basophils Relative: 2 %
Eosinophils Absolute: 0.2 10*3/uL (ref 0.0–0.5)
Eosinophils Relative: 2 %
HCT: 37.1 % — ABNORMAL LOW (ref 39.0–52.0)
Hemoglobin: 11.5 g/dL — ABNORMAL LOW (ref 13.0–17.0)
Immature Granulocytes: 1 %
Lymphocytes Relative: 18 %
Lymphs Abs: 1.5 10*3/uL (ref 0.7–4.0)
MCH: 28.5 pg (ref 26.0–34.0)
MCHC: 31 g/dL (ref 30.0–36.0)
MCV: 91.8 fL (ref 80.0–100.0)
Monocytes Absolute: 1.4 10*3/uL — ABNORMAL HIGH (ref 0.1–1.0)
Monocytes Relative: 16 %
Neutro Abs: 5.2 10*3/uL (ref 1.7–7.7)
Neutrophils Relative %: 61 %
Platelet Count: 266 10*3/uL (ref 150–400)
RBC: 4.04 MIL/uL — ABNORMAL LOW (ref 4.22–5.81)
RDW: 14.8 % (ref 11.5–15.5)
WBC Count: 8.5 10*3/uL (ref 4.0–10.5)
nRBC: 0 % (ref 0.0–0.2)

## 2021-09-08 LAB — CMP (CANCER CENTER ONLY)
ALT: 28 U/L (ref 0–44)
AST: 12 U/L — ABNORMAL LOW (ref 15–41)
Albumin: 3.7 g/dL (ref 3.5–5.0)
Alkaline Phosphatase: 87 U/L (ref 38–126)
Anion gap: 6 (ref 5–15)
BUN: 33 mg/dL — ABNORMAL HIGH (ref 8–23)
CO2: 29 mmol/L (ref 22–32)
Calcium: 8.8 mg/dL — ABNORMAL LOW (ref 8.9–10.3)
Chloride: 106 mmol/L (ref 98–111)
Creatinine: 1.74 mg/dL — ABNORMAL HIGH (ref 0.61–1.24)
GFR, Estimated: 39 mL/min — ABNORMAL LOW (ref >60)
Glucose, Bld: 130 mg/dL — ABNORMAL HIGH (ref 70–99)
Potassium: 4.5 mmol/L (ref 3.5–5.1)
Sodium: 141 mmol/L (ref 135–145)
Total Bilirubin: 0.4 mg/dL (ref 0.3–1.2)
Total Protein: 6.8 g/dL (ref 6.5–8.1)

## 2021-09-08 MED ORDER — BORTEZOMIB CHEMO SQ INJECTION 3.5 MG (2.5MG/ML)
1.3000 mg/m2 | Freq: Once | INTRAMUSCULAR | Status: AC
  Administered 2021-09-08: 2.75 mg via SUBCUTANEOUS
  Filled 2021-09-08: qty 1.1

## 2021-09-08 MED ORDER — PROCHLORPERAZINE MALEATE 10 MG PO TABS: 10.0000 mg | ORAL_TABLET | Freq: Once | ORAL | Status: DC

## 2021-09-08 NOTE — Patient Instructions (Signed)
Bortezomib Injection What is this medication? BORTEZOMIB (bor TEZ oh mib) treats lymphoma. It may also be used to treat multiple myeloma, a type of bone marrow cancer. It works by blocking a protein that causes cancer cells to grow and multiply. This helps to slow or stop the spread of cancer cells. This medicine may be used for other purposes; ask your health care provider or pharmacist if you have questions. COMMON BRAND NAME(S): Velcade What should I tell my care team before I take this medication? They need to know if you have any of these conditions: Dehydration Diabetes Heart disease Liver disease Tingling of the fingers or toes or other nerve disorder An unusual or allergic reaction to bortezomib, other medications, foods, dyes, or preservatives If you or your partner are pregnant or trying to get pregnant Breastfeeding How should I use this medication? This medication is injected into a vein or under the skin. It is given by your care team in a hospital or clinic setting. Talk to your care team about the use of this medication in children. Special care may be needed. Overdosage: If you think you have taken too much of this medicine contact a poison control center or emergency room at once. NOTE: This medicine is only for you. Do not share this medicine with others. What if I miss a dose? Keep appointments for follow-up doses. It is important not to miss your dose. Call your care team if you are unable to keep an appointment. What may interact with this medication? Ketoconazole Rifampin This list may not describe all possible interactions. Give your health care provider a list of all the medicines, herbs, non-prescription drugs, or dietary supplements you use. Also tell them if you smoke, drink alcohol, or use illegal drugs. Some items may interact with your medicine. What should I watch for while using this medication? Your condition will be monitored carefully while you are  receiving this medication. You may need blood work while taking this medication. This medication may affect your coordination, reaction time, or judgment. Do not drive or operate machinery until you know how this medication affects you. Sit up or stand slowly to reduce the risk of dizzy or fainting spells. Drinking alcohol with this medication can increase the risk of these side effects. This medication may increase your risk of getting an infection. Call your care team for advice if you get a fever, chills, sore throat, or other symptoms of a cold or flu. Do not treat yourself. Try to avoid being around people who are sick. Check with your care team if you have severe diarrhea, nausea, and vomiting, or if you sweat a lot. The loss of too much body fluid may make it dangerous for you to take this medication. Talk to your care team if you may be pregnant. Serious birth defects can occur if you take this medication during pregnancy and for 7 months after the last dose. You will need a negative pregnancy test before starting this medication. Contraception is recommended while taking this medication and for 7 months after the last dose. Your care team can help you find the option that works for you. If your partner can get pregnant, use a condom during sex while taking this medication and for 4 months after the last dose. Do not breastfeed while taking this medication and for 2 months after the last dose. This medication may cause infertility. Talk to your care team if you are concerned about your fertility. What side effects   may I notice from receiving this medication? Side effects that you should report to your care team as soon as possible: Allergic reactions--skin rash, itching, hives, swelling of the face, lips, tongue, or throat Bleeding--bloody or black, tar-like stools, vomiting blood or brown material that looks like coffee grounds, red or dark brown urine, small red or purple spots on skin, unusual  bruising or bleeding Bleeding in the brain--severe headache, stiff neck, confusion, dizziness, change in vision, numbness or weakness of the face, arm, or leg, trouble speaking, trouble walking, vomiting Bowel blockage--stomach cramping, unable to have a bowel movement or pass gas, loss of appetite, vomiting Heart failure--shortness of breath, swelling of the ankles, feet, or hands, sudden weight gain, unusual weakness or fatigue Infection--fever, chills, cough, sore throat, wounds that don't heal, pain or trouble when passing urine, general feeling of discomfort or being unwell Liver injury--right upper belly pain, loss of appetite, nausea, light-colored stool, dark yellow or brown urine, yellowing skin or eyes, unusual weakness or fatigue Low blood pressure--dizziness, feeling faint or lightheaded, blurry vision Lung injury--shortness of breath or trouble breathing, cough, spitting up blood, chest pain, fever Pain, tingling, or numbness in the hands or feet Severe or prolonged diarrhea Stomach pain, bloody diarrhea, pale skin, unusual weakness or fatigue, decrease in the amount of urine, which may be signs of hemolytic uremic syndrome Sudden and severe headache, confusion, change in vision, seizures, which may be signs of posterior reversible encephalopathy syndrome (PRES) TTP--purple spots on the skin or inside the mouth, pale skin, yellowing skin or eyes, unusual weakness or fatigue, fever, fast or irregular heartbeat, confusion, change in vision, trouble speaking, trouble walking Tumor lysis syndrome (TLS)--nausea, vomiting, diarrhea, decrease in the amount of urine, dark urine, unusual weakness or fatigue, confusion, muscle pain or cramps, fast or irregular heartbeat, joint pain Side effects that usually do not require medical attention (report to your care team if they continue or are bothersome): Constipation Diarrhea Fatigue Loss of appetite Nausea This list may not describe all possible  side effects. Call your doctor for medical advice about side effects. You may report side effects to FDA at 1-800-FDA-1088. Where should I keep my medication? This medication is given in a hospital or clinic. It will not be stored at home. NOTE: This sheet is a summary. It may not cover all possible information. If you have questions about this medicine, talk to your doctor, pharmacist, or health care provider.  2023 Elsevier/Gold Standard (2021-05-27 00:00:00)  

## 2021-09-08 NOTE — Progress Notes (Signed)
Ok to treat patient with Creatinine of 1.7 per Dr Marin Olp

## 2021-09-09 DIAGNOSIS — Z23 Encounter for immunization: Secondary | ICD-10-CM | POA: Diagnosis not present

## 2021-09-10 ENCOUNTER — Other Ambulatory Visit (HOSPITAL_COMMUNITY): Payer: Self-pay

## 2021-09-11 ENCOUNTER — Other Ambulatory Visit: Payer: Self-pay

## 2021-09-11 ENCOUNTER — Other Ambulatory Visit: Payer: Self-pay | Admitting: *Deleted

## 2021-09-11 DIAGNOSIS — K5792 Diverticulitis of intestine, part unspecified, without perforation or abscess without bleeding: Secondary | ICD-10-CM

## 2021-09-11 DIAGNOSIS — G6289 Other specified polyneuropathies: Secondary | ICD-10-CM

## 2021-09-11 DIAGNOSIS — D472 Monoclonal gammopathy: Secondary | ICD-10-CM

## 2021-09-11 DIAGNOSIS — M109 Gout, unspecified: Secondary | ICD-10-CM

## 2021-09-15 ENCOUNTER — Inpatient Hospital Stay: Payer: Medicare Other | Attending: Hematology & Oncology

## 2021-09-15 ENCOUNTER — Ambulatory Visit (HOSPITAL_BASED_OUTPATIENT_CLINIC_OR_DEPARTMENT_OTHER)
Admission: RE | Admit: 2021-09-15 | Discharge: 2021-09-15 | Disposition: A | Discharge status: Home / Self Care | Payer: Medicare Other | Source: Ambulatory Visit | Attending: Hematology & Oncology | Admitting: Hematology & Oncology

## 2021-09-15 ENCOUNTER — Inpatient Hospital Stay (HOSPITAL_BASED_OUTPATIENT_CLINIC_OR_DEPARTMENT_OTHER): Payer: Medicare Other | Admitting: *Deleted

## 2021-09-15 VITALS — BP 120/49 | HR 58 | Temp 97.7°F | Resp 17 | Ht 74.0 in | Wt 176.0 lb

## 2021-09-15 DIAGNOSIS — Z7982 Long term (current) use of aspirin: Secondary | ICD-10-CM | POA: Diagnosis not present

## 2021-09-15 DIAGNOSIS — M549 Dorsalgia, unspecified: Secondary | ICD-10-CM | POA: Diagnosis not present

## 2021-09-15 DIAGNOSIS — K5792 Diverticulitis of intestine, part unspecified, without perforation or abscess without bleeding: Secondary | ICD-10-CM

## 2021-09-15 DIAGNOSIS — D472 Monoclonal gammopathy: Secondary | ICD-10-CM

## 2021-09-15 DIAGNOSIS — R5383 Other fatigue: Secondary | ICD-10-CM | POA: Insufficient documentation

## 2021-09-15 DIAGNOSIS — C9000 Multiple myeloma not having achieved remission: Secondary | ICD-10-CM | POA: Insufficient documentation

## 2021-09-15 DIAGNOSIS — M109 Gout, unspecified: Secondary | ICD-10-CM

## 2021-09-15 DIAGNOSIS — G6289 Other specified polyneuropathies: Secondary | ICD-10-CM

## 2021-09-15 DIAGNOSIS — R059 Cough, unspecified: Secondary | ICD-10-CM | POA: Diagnosis not present

## 2021-09-15 DIAGNOSIS — Z7961 Long term (current) use of immunomodulator: Secondary | ICD-10-CM | POA: Diagnosis not present

## 2021-09-15 LAB — CBC WITH DIFFERENTIAL (CANCER CENTER ONLY)
Abs Immature Granulocytes: 0.06 10*3/uL (ref 0.00–0.07)
Basophils Absolute: 0.2 10*3/uL — ABNORMAL HIGH (ref 0.0–0.1)
Basophils Relative: 3 %
Eosinophils Absolute: 0.3 10*3/uL (ref 0.0–0.5)
Eosinophils Relative: 4 %
HCT: 37 % — ABNORMAL LOW (ref 39.0–52.0)
Hemoglobin: 11.4 g/dL — ABNORMAL LOW (ref 13.0–17.0)
Immature Granulocytes: 1 %
Lymphocytes Relative: 20 %
Lymphs Abs: 1.6 10*3/uL (ref 0.7–4.0)
MCH: 28.4 pg (ref 26.0–34.0)
MCHC: 30.8 g/dL (ref 30.0–36.0)
MCV: 92 fL (ref 80.0–100.0)
Monocytes Absolute: 0.6 10*3/uL (ref 0.1–1.0)
Monocytes Relative: 8 %
Neutro Abs: 5.1 10*3/uL (ref 1.7–7.7)
Neutrophils Relative %: 64 %
Platelet Count: 205 10*3/uL (ref 150–400)
RBC: 4.02 MIL/uL — ABNORMAL LOW (ref 4.22–5.81)
RDW: 14.7 % (ref 11.5–15.5)
WBC Count: 7.8 10*3/uL (ref 4.0–10.5)
nRBC: 0 % (ref 0.0–0.2)

## 2021-09-15 LAB — CMP (CANCER CENTER ONLY)
ALT: 12 U/L (ref 0–44)
AST: 12 U/L — ABNORMAL LOW (ref 15–41)
Albumin: 3.9 g/dL (ref 3.5–5.0)
Alkaline Phosphatase: 89 U/L (ref 38–126)
Anion gap: 6 (ref 5–15)
BUN: 27 mg/dL — ABNORMAL HIGH (ref 8–23)
CO2: 29 mmol/L (ref 22–32)
Calcium: 9.1 mg/dL (ref 8.9–10.3)
Chloride: 106 mmol/L (ref 98–111)
Creatinine: 1.78 mg/dL — ABNORMAL HIGH (ref 0.61–1.24)
GFR, Estimated: 38 mL/min — ABNORMAL LOW (ref >60)
Glucose, Bld: 122 mg/dL — ABNORMAL HIGH (ref 70–99)
Potassium: 5.3 mmol/L — ABNORMAL HIGH (ref 3.5–5.1)
Sodium: 141 mmol/L (ref 135–145)
Total Bilirubin: 0.4 mg/dL (ref 0.3–1.2)
Total Protein: 7 g/dL (ref 6.5–8.1)

## 2021-09-15 MED ORDER — BORTEZOMIB CHEMO SQ INJECTION 3.5 MG (2.5MG/ML)
1.3000 mg/m2 | Freq: Once | INTRAMUSCULAR | Status: AC
  Administered 2021-09-15: 2.75 mg via SUBCUTANEOUS
  Filled 2021-09-15: qty 1.1

## 2021-09-15 MED ORDER — BENZONATATE 200 MG PO CAPS: 200.0000 mg | ORAL_CAPSULE | Freq: Three times a day (TID) | ORAL | 0 refills | Status: DC | PRN

## 2021-09-15 MED ORDER — PROCHLORPERAZINE MALEATE 10 MG PO TABS: 10.0000 mg | ORAL_TABLET | Freq: Once | ORAL | Status: DC

## 2021-09-15 NOTE — Progress Notes (Signed)
OK to treat with creat-1.78 per order of Dr. Marin Olp.

## 2021-09-15 NOTE — Progress Notes (Signed)
Pt wife states - "he's tried the robitussin x2 weeks and its giving him a headache. Pt has been continuously coughing. No fever, no coughing up discolored spit. Pt denies shortness of breath with activity, no pressure in chest upon in/expiration, no trouble swallowing food or drink, or He has not be evaluated by PCP. Will review with MD for clarification on next step.Rx for Tessallon pearls to [facility]. VO for chest xray to r/o persistant cough.

## 2021-09-15 NOTE — Patient Instructions (Signed)
Bortezomib Injection What is this medication? BORTEZOMIB (bor TEZ oh mib) treats lymphoma. It may also be used to treat multiple myeloma, a type of bone marrow cancer. It works by blocking a protein that causes cancer cells to grow and multiply. This helps to slow or stop the spread of cancer cells. This medicine may be used for other purposes; ask your health care provider or pharmacist if you have questions. COMMON BRAND NAME(S): Velcade What should I tell my care team before I take this medication? They need to know if you have any of these conditions: Dehydration Diabetes Heart disease Liver disease Tingling of the fingers or toes or other nerve disorder An unusual or allergic reaction to bortezomib, other medications, foods, dyes, or preservatives If you or your partner are pregnant or trying to get pregnant Breastfeeding How should I use this medication? This medication is injected into a vein or under the skin. It is given by your care team in a hospital or clinic setting. Talk to your care team about the use of this medication in children. Special care may be needed. Overdosage: If you think you have taken too much of this medicine contact a poison control center or emergency room at once. NOTE: This medicine is only for you. Do not share this medicine with others. What if I miss a dose? Keep appointments for follow-up doses. It is important not to miss your dose. Call your care team if you are unable to keep an appointment. What may interact with this medication? Ketoconazole Rifampin This list may not describe all possible interactions. Give your health care provider a list of all the medicines, herbs, non-prescription drugs, or dietary supplements you use. Also tell them if you smoke, drink alcohol, or use illegal drugs. Some items may interact with your medicine. What should I watch for while using this medication? Your condition will be monitored carefully while you are  receiving this medication. You may need blood work while taking this medication. This medication may affect your coordination, reaction time, or judgment. Do not drive or operate machinery until you know how this medication affects you. Sit up or stand slowly to reduce the risk of dizzy or fainting spells. Drinking alcohol with this medication can increase the risk of these side effects. This medication may increase your risk of getting an infection. Call your care team for advice if you get a fever, chills, sore throat, or other symptoms of a cold or flu. Do not treat yourself. Try to avoid being around people who are sick. Check with your care team if you have severe diarrhea, nausea, and vomiting, or if you sweat a lot. The loss of too much body fluid may make it dangerous for you to take this medication. Talk to your care team if you may be pregnant. Serious birth defects can occur if you take this medication during pregnancy and for 7 months after the last dose. You will need a negative pregnancy test before starting this medication. Contraception is recommended while taking this medication and for 7 months after the last dose. Your care team can help you find the option that works for you. If your partner can get pregnant, use a condom during sex while taking this medication and for 4 months after the last dose. Do not breastfeed while taking this medication and for 2 months after the last dose. This medication may cause infertility. Talk to your care team if you are concerned about your fertility. What side effects   may I notice from receiving this medication? Side effects that you should report to your care team as soon as possible: Allergic reactions--skin rash, itching, hives, swelling of the face, lips, tongue, or throat Bleeding--bloody or black, tar-like stools, vomiting blood or brown material that looks like coffee grounds, red or dark brown urine, small red or purple spots on skin, unusual  bruising or bleeding Bleeding in the brain--severe headache, stiff neck, confusion, dizziness, change in vision, numbness or weakness of the face, arm, or leg, trouble speaking, trouble walking, vomiting Bowel blockage--stomach cramping, unable to have a bowel movement or pass gas, loss of appetite, vomiting Heart failure--shortness of breath, swelling of the ankles, feet, or hands, sudden weight gain, unusual weakness or fatigue Infection--fever, chills, cough, sore throat, wounds that don't heal, pain or trouble when passing urine, general feeling of discomfort or being unwell Liver injury--right upper belly pain, loss of appetite, nausea, light-colored stool, dark yellow or brown urine, yellowing skin or eyes, unusual weakness or fatigue Low blood pressure--dizziness, feeling faint or lightheaded, blurry vision Lung injury--shortness of breath or trouble breathing, cough, spitting up blood, chest pain, fever Pain, tingling, or numbness in the hands or feet Severe or prolonged diarrhea Stomach pain, bloody diarrhea, pale skin, unusual weakness or fatigue, decrease in the amount of urine, which may be signs of hemolytic uremic syndrome Sudden and severe headache, confusion, change in vision, seizures, which may be signs of posterior reversible encephalopathy syndrome (PRES) TTP--purple spots on the skin or inside the mouth, pale skin, yellowing skin or eyes, unusual weakness or fatigue, fever, fast or irregular heartbeat, confusion, change in vision, trouble speaking, trouble walking Tumor lysis syndrome (TLS)--nausea, vomiting, diarrhea, decrease in the amount of urine, dark urine, unusual weakness or fatigue, confusion, muscle pain or cramps, fast or irregular heartbeat, joint pain Side effects that usually do not require medical attention (report to your care team if they continue or are bothersome): Constipation Diarrhea Fatigue Loss of appetite Nausea This list may not describe all possible  side effects. Call your doctor for medical advice about side effects. You may report side effects to FDA at 1-800-FDA-1088. Where should I keep my medication? This medication is given in a hospital or clinic. It will not be stored at home. NOTE: This sheet is a summary. It may not cover all possible information. If you have questions about this medicine, talk to your doctor, pharmacist, or health care provider.  2023 Elsevier/Gold Standard (2021-05-27 00:00:00)  

## 2021-09-16 ENCOUNTER — Encounter: Payer: Self-pay | Admitting: Hematology & Oncology

## 2021-09-16 ENCOUNTER — Telehealth: Payer: Self-pay

## 2021-09-16 NOTE — Telephone Encounter (Signed)
-----   Message from [name], MD sent at 09/15/2021  5:09 PM EDT ----- Call  and let him know that the chest x-ray is normal.  There is no pneumonia or bronchitis.  Hopefully, the Tessalon Perles will help the cough.

## 2021-09-16 NOTE — Telephone Encounter (Signed)
Per Dr. Marin Olp request, pt made aware that the chest xray was normal. Pt aware that there is no pneumonia or bronchitis. Pt aware to continue to use the tessalon perles as needed. Pt verbalized understanding and was happy with the update and had no further questions.

## 2021-09-17 ENCOUNTER — Other Ambulatory Visit: Payer: Self-pay | Admitting: *Deleted

## 2021-09-17 DIAGNOSIS — D472 Monoclonal gammopathy: Secondary | ICD-10-CM

## 2021-09-17 MED ORDER — LENALIDOMIDE 10 MG PO CAPS: 10.0000 mg | ORAL_CAPSULE | Freq: Every day | ORAL | 0 refills | Status: DC

## 2021-09-22 ENCOUNTER — Inpatient Hospital Stay: Payer: Medicare Other

## 2021-09-22 ENCOUNTER — Encounter: Payer: Self-pay | Admitting: Hematology & Oncology

## 2021-09-22 ENCOUNTER — Inpatient Hospital Stay (HOSPITAL_BASED_OUTPATIENT_CLINIC_OR_DEPARTMENT_OTHER): Payer: Medicare Other | Admitting: Hematology & Oncology

## 2021-09-22 ENCOUNTER — Inpatient Hospital Stay: Payer: Medicare Other | Admitting: Family

## 2021-09-22 VITALS — BP 127/52 | HR 60 | Temp 97.8°F | Resp 18 | Ht 74.5 in | Wt 172.8 lb

## 2021-09-22 DIAGNOSIS — D472 Monoclonal gammopathy: Secondary | ICD-10-CM

## 2021-09-22 DIAGNOSIS — C9000 Multiple myeloma not having achieved remission: Secondary | ICD-10-CM | POA: Diagnosis not present

## 2021-09-22 LAB — CMP (CANCER CENTER ONLY)
ALT: 9 U/L (ref 0–44)
AST: 11 U/L — ABNORMAL LOW (ref 15–41)
Albumin: 4 g/dL (ref 3.5–5.0)
Alkaline Phosphatase: 94 U/L (ref 38–126)
Anion gap: 6 (ref 5–15)
BUN: 25 mg/dL — ABNORMAL HIGH (ref 8–23)
CO2: 31 mmol/L (ref 22–32)
Calcium: 9.3 mg/dL (ref 8.9–10.3)
Chloride: 104 mmol/L (ref 98–111)
Creatinine: 1.59 mg/dL — ABNORMAL HIGH (ref 0.61–1.24)
GFR, Estimated: 43 mL/min — ABNORMAL LOW (ref >60)
Glucose, Bld: 122 mg/dL — ABNORMAL HIGH (ref 70–99)
Potassium: 4.3 mmol/L (ref 3.5–5.1)
Sodium: 141 mmol/L (ref 135–145)
Total Bilirubin: 0.5 mg/dL (ref 0.3–1.2)
Total Protein: 7.2 g/dL (ref 6.5–8.1)

## 2021-09-22 LAB — CBC WITH DIFFERENTIAL (CANCER CENTER ONLY)
Abs Immature Granulocytes: 0.04 10*3/uL (ref 0.00–0.07)
Basophils Absolute: 0.2 10*3/uL — ABNORMAL HIGH (ref 0.0–0.1)
Basophils Relative: 2 %
Eosinophils Absolute: 0.4 10*3/uL (ref 0.0–0.5)
Eosinophils Relative: 6 %
HCT: 38.6 % — ABNORMAL LOW (ref 39.0–52.0)
Hemoglobin: 12.1 g/dL — ABNORMAL LOW (ref 13.0–17.0)
Immature Granulocytes: 1 %
Lymphocytes Relative: 21 %
Lymphs Abs: 1.3 10*3/uL (ref 0.7–4.0)
MCH: 28.4 pg (ref 26.0–34.0)
MCHC: 31.3 g/dL (ref 30.0–36.0)
MCV: 90.6 fL (ref 80.0–100.0)
Monocytes Absolute: 0.9 10*3/uL (ref 0.1–1.0)
Monocytes Relative: 14 %
Neutro Abs: 3.5 10*3/uL (ref 1.7–7.7)
Neutrophils Relative %: 56 %
Platelet Count: 152 10*3/uL (ref 150–400)
RBC: 4.26 MIL/uL (ref 4.22–5.81)
RDW: 14.6 % (ref 11.5–15.5)
WBC Count: 6.2 10*3/uL (ref 4.0–10.5)
nRBC: 0 % (ref 0.0–0.2)

## 2021-09-22 LAB — LACTATE DEHYDROGENASE: LDH: 101 U/L (ref 98–192)

## 2021-09-22 MED ORDER — BORTEZOMIB CHEMO SQ INJECTION 3.5 MG (2.5MG/ML)
1.3000 mg/m2 | Freq: Once | INTRAMUSCULAR | Status: AC
  Administered 2021-09-22: 2.75 mg via SUBCUTANEOUS
  Filled 2021-09-22: qty 1.1

## 2021-09-22 MED ORDER — PROCHLORPERAZINE MALEATE 10 MG PO TABS: 10.0000 mg | ORAL_TABLET | Freq: Once | ORAL | Status: DC

## 2021-09-22 NOTE — Progress Notes (Signed)
OK to treat with creat-1.59 per order of Dr. Marin Olp,

## 2021-09-22 NOTE — Patient Instructions (Signed)
[facility]  Discharge Instructions: Thank you for choosing [facility] to provide your oncology and hematology care.   If you have a lab appointment with the Cancer Center, please go directly to the Cancer Center and check in at the registration area.  Wear comfortable clothing and clothing appropriate for easy access to any Portacath or PICC line.   We strive to give you quality time with your provider. You may need to reschedule your appointment if you arrive late (15 or more minutes).  Arriving late affects you and other patients whose appointments are after yours.  Also, if you miss three or more appointments without notifying the office, you may be dismissed from the clinic at the provider's discretion.      For prescription refill requests, have your pharmacy contact our office and allow 72 hours for refills to be completed.    Today you received the following chemotherapy and/or immunotherapy agents Velcade      To help prevent nausea and vomiting after your treatment, we encourage you to take your nausea medication as directed.  BELOW ARE SYMPTOMS THAT SHOULD BE REPORTED IMMEDIATELY: *FEVER GREATER THAN 100.4 F (38 C) OR HIGHER *CHILLS OR SWEATING *NAUSEA AND VOMITING THAT IS NOT CONTROLLED WITH YOUR NAUSEA MEDICATION *UNUSUAL SHORTNESS OF BREATH *UNUSUAL BRUISING OR BLEEDING *URINARY PROBLEMS (pain or burning when urinating, or frequent urination) *BOWEL PROBLEMS (unusual diarrhea, constipation, pain near the anus) TENDERNESS IN MOUTH AND THROAT WITH OR WITHOUT PRESENCE OF ULCERS (sore throat, sores in mouth, or a toothache) UNUSUAL RASH, SWELLING OR PAIN  UNUSUAL VAGINAL DISCHARGE OR ITCHING   Items with * indicate a potential emergency and should be followed up as soon as possible or go to the Emergency Department if any problems should occur.  Please show the CHEMOTHERAPY ALERT CARD or IMMUNOTHERAPY ALERT CARD at check-in to the  Emergency Department and triage nurse. Should you have questions after your visit or need to cancel or reschedule your appointment, please contact [facility]  [phone] and follow the prompts.  Office hours are 8:00 a.m. to 4:30 p.m. Monday - Friday. Please note that voicemails left after 4:00 p.m. may not be returned until the following business day.  We are closed weekends and major holidays. You have access to a nurse at all times for urgent questions. Please call the main number to the clinic [phone] and follow the prompts.  For any non-urgent questions, you may also contact your provider using MyChart. We now offer e-Visits for anyone 18 and older to request care online for non-urgent symptoms. For details visit [email or web address].   Also download the MyChart app! Go to the app store, search "MyChart", open the app, select [facility], and log in with your MyChart username and password.  Masks are optional in the cancer centers. If you would like for your care team to wear a mask while they are taking care of you, please let them know. You may have one support person who is at least 82 years old accompany you for your appointments. 

## 2021-09-22 NOTE — Progress Notes (Signed)
Hematology and Oncology Follow Up Visit  OLAF MESA [id number] May 19, 1940 81 y.o. 09/22/2021   Principle Diagnosis:  IgA kappa  myeloma  Current Therapy:   Velcade/Revlimid/Decadron -- s/p cycle #2 -- start on 07/27/2021     Interim History:  Mr. Thetford is back for follow-up.  He is looking pretty good.  I told that he really needs to play golf with his friends.  We can always change his appointments.  Typically they go on Tuesdays.  I really would hate to see him miss golf with his friends.  We can was moving to Wednesday or Thursday.  He is on the low-dose Revlimid.  So far, he has had no issues with the Revlimid.  He has had no rash.  There has been no diarrhea.  He has had no bleeding.  He has had a little bit of a cough which seems to be somewhat better.  Of note, when we last checked his myeloma studies, his M spike was 0.7 g/dL.  His IgA level was 1050 mg/dL.  His Kappa light chain was 17.6 mg/dL.  Hopefully, they are better now.  If not, we will going to have to add Faspro in my opinion.  He has had no fever.  He has had no mouth sores.  He has had a little bit of fatigue after the Velcade.  Overall, I was his performance status is probably ECOG 1.    Medications:  Current Outpatient Medications:    acyclovir (ZOVIRAX) 400 MG tablet, Take 1 tablet (400 mg total) by mouth 2 (two) times daily., Disp: 60 tablet, Rfl: 3   aspirin 325 MG tablet, Take 325 mg by mouth daily., Disp: , Rfl:    gabapentin (NEURONTIN) 300 MG capsule, TAKE ONE CAPSULE BY MOUTH DAILY WITH BREAKFAST, 1 CAPSULE WITH DINNER AND 2 CAPSULES AT BEDTIME, Disp: 360 capsule, Rfl: 4   lenalidomide (REVLIMID) 10 MG capsule, Take 1 capsule (10 mg total) by mouth daily. Take for 21 days on, 7 days off. Repeat every 28 days. [employer] Auth # [id number], Disp: 21 capsule, Rfl: 0   losartan (COZAAR) 50 MG tablet, TAKE 1 TABLET BY MOUTH  DAILY, Disp: 90 tablet, Rfl: 3   Multiple Vitamins-Minerals (PRESERVISION AREDS 2  PO), Take by mouth daily at 6 (six) AM., Disp: , Rfl:    amLODipine (NORVASC) 5 MG tablet, TAKE 1 TABLET BY MOUTH  DAILY, Disp: 90 tablet, Rfl: 3   amoxicillin (AMOXIL) 500 MG capsule, Take four tablets (2000 mg) total one hour prior to dental procedure. (Patient not taking: Reported on 07/01/2021), Disp: 4 capsule, Rfl: 0   benzonatate (TESSALON) 200 MG capsule, Take 1 capsule (200 mg total) by mouth 3 (three) times daily as needed for cough., Disp: 20 capsule, Rfl: 0   predniSONE (DELTASONE) 20 MG tablet, Take 2 tablets once daily for 6 days as needed for gout flare, Disp: 12 tablet, Rfl: 0 No current facility-administered medications for this visit.  Facility-Administered Medications Ordered in Other Visits:    0.9 %  sodium chloride infusion, , Intravenous, Continuous, Kenndra Morris, Rudell Cobb, MD, Stopped at 08/05/14 1710   prochlorperazine (COMPAZINE) tablet 10 mg, 10 mg, Oral, Once, Najma Bozarth, Rudell Cobb, MD  Allergies: No Known Allergies  Past Medical History, Surgical history, Social history, and Family History were reviewed and updated.  Review of Systems: Review of Systems  Constitutional:  Positive for malaise/fatigue.  HENT:  Positive for congestion.   Eyes: Negative.   Respiratory:  Positive for shortness  of breath.   Cardiovascular:  Positive for palpitations.  Gastrointestinal: Negative.   Genitourinary: Negative.   Musculoskeletal:  Positive for back pain and myalgias.  Skin: Negative.   Neurological:  Positive for tingling.  Endo/Heme/Allergies: Negative.   Psychiatric/Behavioral: Negative.      Physical Exam:  height is 6' 2.5" (1.892 m) and weight is 172 lb 12 oz (78.4 kg). His oral temperature is 97.8 F (36.6 C). His blood pressure is 127/52 (abnormal) and his pulse is 60. His respiration is 18 and oxygen saturation is 98%.   Wt Readings from Last 3 Encounters:  09/22/21 172 lb 12 oz (78.4 kg)  08/25/21 176 lb (79.8 kg)  07/10/21 176 lb (79.8 kg)     Physical  Exam Vitals reviewed.  HENT:     Head: Normocephalic and atraumatic.  Eyes:     Pupils: Pupils are equal, round, and reactive to light.  Cardiovascular:     Rate and Rhythm: Normal rate and regular rhythm.     Heart sounds: Normal heart sounds.  Pulmonary:     Effort: Pulmonary effort is normal.     Breath sounds: Normal breath sounds.  Abdominal:     General: Bowel sounds are normal.     Palpations: Abdomen is soft.  Musculoskeletal:        General: No tenderness or deformity. Normal range of motion.     Cervical back: Normal range of motion.  Lymphadenopathy:     Cervical: No cervical adenopathy.  Skin:    General: Skin is warm and dry.     Findings: No erythema or rash.  Neurological:     Mental Status: He is alert and oriented to person, place, and time.  Psychiatric:        Behavior: Behavior normal.        Thought Content: Thought content normal.        Judgment: Judgment normal.      Lab Results  Component Value Date   WBC 6.2 09/22/2021   HGB 12.1 (L) 09/22/2021   HCT 38.6 (L) 09/22/2021   MCV 90.6 09/22/2021   PLT 152 09/22/2021     Chemistry      Component Value Date/Time   NA 141 09/22/2021 1035   NA 143 12/06/2016 1156   NA 139 08/02/2016 1146   K 4.3 09/22/2021 1035   K 4.2 12/06/2016 1156   K 4.7 08/02/2016 1146   CL 104 09/22/2021 1035   CL 107 12/06/2016 1156   CO2 31 09/22/2021 1035   CO2 28 12/06/2016 1156   CO2 24 08/02/2016 1146   BUN 25 (H) 09/22/2021 1035   BUN 29 (H) 12/06/2016 1156   BUN 34.3 (H) 08/02/2016 1146   CREATININE 1.59 (H) 09/22/2021 1035   CREATININE 2.1 (H) 12/06/2016 1156   CREATININE 1.7 (H) 08/02/2016 1146      Component Value Date/Time   CALCIUM 9.3 09/22/2021 1035   CALCIUM 9.1 12/06/2016 1156   CALCIUM 9.5 08/02/2016 1146   ALKPHOS 94 09/22/2021 1035   ALKPHOS 108 (H) 12/06/2016 1156   ALKPHOS 115 08/02/2016 1146   AST 11 (L) 09/22/2021 1035   AST 14 08/02/2016 1146   ALT 9 09/22/2021 1035   ALT 18  12/06/2016 1156   ALT 9 08/02/2016 1146   BILITOT 0.5 09/22/2021 1035   BILITOT 0.41 08/02/2016 1146      Impression and Plan:  Mr. Slauson is 81 year old man with IgA kappa myeloma.  I would consider him as  myeloma now.  Again, his kappa light chains are going up.  He has had 2 cycles of treatment.  Hopefully, we will see that his numbers are improving.  If not, again, we will have to add Faspro to his protocol.  I would not be confident that he is going to respond.  We will plan to get him back in another month.  He will get his Velcade weekly for the next 3 weeks and then off.   I told him that he can go down to [facility] with his wife.  His wife wants to go down this weekend.  I see no problem while he cannot go down and enjoy himself.     [name], MD 9/12/202312:02 PM

## 2021-09-23 ENCOUNTER — Other Ambulatory Visit: Payer: Self-pay

## 2021-09-23 LAB — KAPPA/LAMBDA LIGHT CHAINS
Kappa free light chain: 161.8 mg/L — ABNORMAL HIGH (ref 3.3–19.4)
Kappa, lambda light chain ratio: 3.42 — ABNORMAL HIGH (ref 0.26–1.65)
Lambda free light chains: 47.3 mg/L — ABNORMAL HIGH (ref 5.7–26.3)

## 2021-09-23 LAB — IGG, IGA, IGM
IgA: 771 mg/dL — ABNORMAL HIGH (ref 61–437)
IgG (Immunoglobin G), Serum: 1094 mg/dL (ref 603–1613)
IgM (Immunoglobulin M), Srm: 78 mg/dL (ref 15–143)

## 2021-09-25 ENCOUNTER — Other Ambulatory Visit: Payer: Self-pay

## 2021-09-28 ENCOUNTER — Encounter: Payer: Self-pay | Admitting: *Deleted

## 2021-09-28 LAB — PROTEIN ELECTROPHORESIS, SERUM, WITH REFLEX
A/G Ratio: 1.1 (ref 0.7–1.7)
Albumin ELP: 3.4 g/dL (ref 2.9–4.4)
Alpha-1-Globulin: 0.3 g/dL (ref 0.0–0.4)
Alpha-2-Globulin: 0.7 g/dL (ref 0.4–1.0)
Beta Globulin: 1.2 g/dL (ref 0.7–1.3)
Gamma Globulin: 1 g/dL (ref 0.4–1.8)
Globulin, Total: 3.2 g/dL (ref 2.2–3.9)
M-Spike, %: 0.5 g/dL — ABNORMAL HIGH
SPEP Interpretation: 0
Total Protein ELP: 6.6 g/dL (ref 6.0–8.5)

## 2021-09-28 LAB — IMMUNOFIXATION REFLEX, SERUM
IgA: 790 mg/dL — ABNORMAL HIGH (ref 61–437)
IgG (Immunoglobin G), Serum: 1090 mg/dL (ref 603–1613)
IgM (Immunoglobulin M), Srm: 76 mg/dL (ref 15–143)

## 2021-10-01 ENCOUNTER — Ambulatory Visit: Payer: Medicare Other | Admitting: Hematology & Oncology

## 2021-10-01 ENCOUNTER — Inpatient Hospital Stay: Payer: Medicare Other

## 2021-10-07 ENCOUNTER — Inpatient Hospital Stay: Payer: Medicare Other

## 2021-10-07 VITALS — BP 122/52 | HR 54 | Temp 97.9°F | Resp 18

## 2021-10-07 DIAGNOSIS — D472 Monoclonal gammopathy: Secondary | ICD-10-CM

## 2021-10-07 DIAGNOSIS — C9000 Multiple myeloma not having achieved remission: Secondary | ICD-10-CM | POA: Diagnosis not present

## 2021-10-07 LAB — CMP (CANCER CENTER ONLY)
ALT: 10 U/L (ref 0–44)
AST: 11 U/L — ABNORMAL LOW (ref 15–41)
Albumin: 3.8 g/dL (ref 3.5–5.0)
Alkaline Phosphatase: 85 U/L (ref 38–126)
Anion gap: 6 (ref 5–15)
BUN: 26 mg/dL — ABNORMAL HIGH (ref 8–23)
CO2: 29 mmol/L (ref 22–32)
Calcium: 8.9 mg/dL (ref 8.9–10.3)
Chloride: 108 mmol/L (ref 98–111)
Creatinine: 1.61 mg/dL — ABNORMAL HIGH (ref 0.61–1.24)
GFR, Estimated: 42 mL/min — ABNORMAL LOW (ref >60)
Glucose, Bld: 134 mg/dL — ABNORMAL HIGH (ref 70–99)
Potassium: 4.5 mmol/L (ref 3.5–5.1)
Sodium: 143 mmol/L (ref 135–145)
Total Bilirubin: 0.3 mg/dL (ref 0.3–1.2)
Total Protein: 6.8 g/dL (ref 6.5–8.1)

## 2021-10-07 LAB — CBC WITH DIFFERENTIAL (CANCER CENTER ONLY)
Abs Immature Granulocytes: 0.01 10*3/uL (ref 0.00–0.07)
Basophils Absolute: 0.2 10*3/uL — ABNORMAL HIGH (ref 0.0–0.1)
Basophils Relative: 4 %
Eosinophils Absolute: 0.1 10*3/uL (ref 0.0–0.5)
Eosinophils Relative: 2 %
HCT: 34.8 % — ABNORMAL LOW (ref 39.0–52.0)
Hemoglobin: 10.8 g/dL — ABNORMAL LOW (ref 13.0–17.0)
Immature Granulocytes: 0 %
Lymphocytes Relative: 27 %
Lymphs Abs: 1.3 10*3/uL (ref 0.7–4.0)
MCH: 28.3 pg (ref 26.0–34.0)
MCHC: 31 g/dL (ref 30.0–36.0)
MCV: 91.3 fL (ref 80.0–100.0)
Monocytes Absolute: 0.5 10*3/uL (ref 0.1–1.0)
Monocytes Relative: 11 %
Neutro Abs: 2.6 10*3/uL (ref 1.7–7.7)
Neutrophils Relative %: 56 %
Platelet Count: 196 10*3/uL (ref 150–400)
RBC: 3.81 MIL/uL — ABNORMAL LOW (ref 4.22–5.81)
RDW: 15.2 % (ref 11.5–15.5)
WBC Count: 4.7 10*3/uL (ref 4.0–10.5)
nRBC: 0 % (ref 0.0–0.2)

## 2021-10-07 LAB — LACTATE DEHYDROGENASE: LDH: 107 U/L (ref 98–192)

## 2021-10-07 MED ORDER — PROCHLORPERAZINE MALEATE 10 MG PO TABS: 10.0000 mg | ORAL_TABLET | Freq: Once | ORAL | Status: DC

## 2021-10-07 MED ORDER — BORTEZOMIB CHEMO SQ INJECTION 3.5 MG (2.5MG/ML)
1.3000 mg/m2 | Freq: Once | INTRAMUSCULAR | Status: AC
  Administered 2021-10-07: 2.75 mg via SUBCUTANEOUS
  Filled 2021-10-07: qty 1.1

## 2021-10-07 NOTE — Progress Notes (Signed)
Labs reviewed by MD ok to treat despite counts

## 2021-10-08 LAB — KAPPA/LAMBDA LIGHT CHAINS
Kappa free light chain: 114.1 mg/L — ABNORMAL HIGH (ref 3.3–19.4)
Kappa, lambda light chain ratio: 3.39 — ABNORMAL HIGH (ref 0.26–1.65)
Lambda free light chains: 33.7 mg/L — ABNORMAL HIGH (ref 5.7–26.3)

## 2021-10-09 ENCOUNTER — Telehealth: Payer: Self-pay

## 2021-10-09 LAB — IGG, IGA, IGM
IgA: 621 mg/dL — ABNORMAL HIGH (ref 61–437)
IgG (Immunoglobin G), Serum: 990 mg/dL (ref 603–1613)
IgM (Immunoglobulin M), Srm: 63 mg/dL (ref 15–143)

## 2021-10-09 NOTE — Telephone Encounter (Signed)
-----   Message from [name], MD sent at 10/08/2021  4:52 PM EDT ----- Please call and let him know that the light chains are still coming down nicely.  This is a good indicator that treatment is working.  Thanks.  Laurey Arrow

## 2021-10-10 ENCOUNTER — Other Ambulatory Visit: Payer: Self-pay

## 2021-10-12 ENCOUNTER — Inpatient Hospital Stay: Payer: Medicare Other | Attending: Hematology & Oncology

## 2021-10-12 DIAGNOSIS — M549 Dorsalgia, unspecified: Secondary | ICD-10-CM | POA: Insufficient documentation

## 2021-10-12 DIAGNOSIS — Z7982 Long term (current) use of aspirin: Secondary | ICD-10-CM | POA: Insufficient documentation

## 2021-10-12 DIAGNOSIS — Z7961 Long term (current) use of immunomodulator: Secondary | ICD-10-CM | POA: Insufficient documentation

## 2021-10-12 DIAGNOSIS — C9000 Multiple myeloma not having achieved remission: Secondary | ICD-10-CM | POA: Insufficient documentation

## 2021-10-12 DIAGNOSIS — R5383 Other fatigue: Secondary | ICD-10-CM | POA: Insufficient documentation

## 2021-10-12 DIAGNOSIS — D472 Monoclonal gammopathy: Secondary | ICD-10-CM

## 2021-10-13 ENCOUNTER — Other Ambulatory Visit: Payer: Self-pay

## 2021-10-13 DIAGNOSIS — D472 Monoclonal gammopathy: Secondary | ICD-10-CM

## 2021-10-13 LAB — PROTEIN ELECTROPHORESIS, SERUM, WITH REFLEX
A/G Ratio: 1 (ref 0.7–1.7)
Albumin ELP: 3.1 g/dL (ref 2.9–4.4)
Alpha-1-Globulin: 0.2 g/dL (ref 0.0–0.4)
Alpha-2-Globulin: 0.6 g/dL (ref 0.4–1.0)
Beta Globulin: 1.1 g/dL (ref 0.7–1.3)
Gamma Globulin: 1.1 g/dL (ref 0.4–1.8)
Globulin, Total: 3 g/dL (ref 2.2–3.9)
M-Spike, %: 0.5 g/dL — ABNORMAL HIGH
SPEP Interpretation: 0
Total Protein ELP: 6.1 g/dL (ref 6.0–8.5)

## 2021-10-13 LAB — IMMUNOFIXATION REFLEX, SERUM
IgA: 610 mg/dL — ABNORMAL HIGH (ref 61–437)
IgG (Immunoglobin G), Serum: 921 mg/dL (ref 603–1613)
IgM (Immunoglobulin M), Srm: 61 mg/dL (ref 15–143)

## 2021-10-14 ENCOUNTER — Inpatient Hospital Stay: Payer: Medicare Other

## 2021-10-14 VITALS — BP 124/48 | HR 52 | Temp 98.8°F | Resp 17

## 2021-10-14 DIAGNOSIS — C9000 Multiple myeloma not having achieved remission: Secondary | ICD-10-CM | POA: Diagnosis not present

## 2021-10-14 DIAGNOSIS — D472 Monoclonal gammopathy: Secondary | ICD-10-CM

## 2021-10-14 LAB — UPEP/UIFE/LIGHT CHAINS/TP, 24-HR UR
% BETA, Urine: 31.3 %
ALPHA 1 URINE: 9 %
Albumin, U: 24.1 %
Alpha 2, Urine: 11.4 %
Free Kappa Lt Chains,Ur: 134.18 mg/L — ABNORMAL HIGH (ref 1.17–86.46)
Free Kappa/Lambda Ratio: 17.91 — ABNORMAL HIGH (ref 1.83–14.26)
Free Lambda Lt Chains,Ur: 7.49 mg/L (ref 0.27–15.21)
GAMMA GLOBULIN URINE: 24.1 %
Total Protein, Urine-Ur/day: 128 mg/24 hr (ref 30–150)
Total Protein, Urine: 5.1 mg/dL
Total Volume: 2500

## 2021-10-14 LAB — CBC WITH DIFFERENTIAL (CANCER CENTER ONLY)
Abs Immature Granulocytes: 0.02 10*3/uL (ref 0.00–0.07)
Basophils Absolute: 0.2 10*3/uL — ABNORMAL HIGH (ref 0.0–0.1)
Basophils Relative: 4 %
Eosinophils Absolute: 0.2 10*3/uL (ref 0.0–0.5)
Eosinophils Relative: 6 %
HCT: 36 % — ABNORMAL LOW (ref 39.0–52.0)
Hemoglobin: 11.2 g/dL — ABNORMAL LOW (ref 13.0–17.0)
Immature Granulocytes: 1 %
Lymphocytes Relative: 24 %
Lymphs Abs: 1 10*3/uL (ref 0.7–4.0)
MCH: 28.6 pg (ref 26.0–34.0)
MCHC: 31.1 g/dL (ref 30.0–36.0)
MCV: 91.8 fL (ref 80.0–100.0)
Monocytes Absolute: 0.3 10*3/uL (ref 0.1–1.0)
Monocytes Relative: 7 %
Neutro Abs: 2.5 10*3/uL (ref 1.7–7.7)
Neutrophils Relative %: 58 %
Platelet Count: 148 10*3/uL — ABNORMAL LOW (ref 150–400)
RBC: 3.92 MIL/uL — ABNORMAL LOW (ref 4.22–5.81)
RDW: 15.8 % — ABNORMAL HIGH (ref 11.5–15.5)
WBC Count: 4.3 10*3/uL (ref 4.0–10.5)
nRBC: 0 % (ref 0.0–0.2)

## 2021-10-14 LAB — CMP (CANCER CENTER ONLY)
ALT: 11 U/L (ref 0–44)
AST: 14 U/L — ABNORMAL LOW (ref 15–41)
Albumin: 3.9 g/dL (ref 3.5–5.0)
Alkaline Phosphatase: 104 U/L (ref 38–126)
Anion gap: 5 (ref 5–15)
BUN: 18 mg/dL (ref 8–23)
CO2: 30 mmol/L (ref 22–32)
Calcium: 9.1 mg/dL (ref 8.9–10.3)
Chloride: 106 mmol/L (ref 98–111)
Creatinine: 1.61 mg/dL — ABNORMAL HIGH (ref 0.61–1.24)
GFR, Estimated: 42 mL/min — ABNORMAL LOW (ref >60)
Glucose, Bld: 129 mg/dL — ABNORMAL HIGH (ref 70–99)
Potassium: 4.3 mmol/L (ref 3.5–5.1)
Sodium: 141 mmol/L (ref 135–145)
Total Bilirubin: 0.3 mg/dL (ref 0.3–1.2)
Total Protein: 6.9 g/dL (ref 6.5–8.1)

## 2021-10-14 LAB — LACTATE DEHYDROGENASE: LDH: 106 U/L (ref 98–192)

## 2021-10-14 MED ORDER — BORTEZOMIB CHEMO SQ INJECTION 3.5 MG (2.5MG/ML)
1.3000 mg/m2 | Freq: Once | INTRAMUSCULAR | Status: AC
  Administered 2021-10-14: 2.75 mg via SUBCUTANEOUS
  Filled 2021-10-14: qty 1.1

## 2021-10-14 MED ORDER — PROCHLORPERAZINE MALEATE 10 MG PO TABS: 10.0000 mg | ORAL_TABLET | Freq: Once | ORAL | Status: DC

## 2021-10-14 NOTE — Progress Notes (Signed)
Per Dr. Marin Olp ok to treat with creatinine 1.61

## 2021-10-14 NOTE — Patient Instructions (Signed)
[facility]  Discharge Instructions: Thank you for choosing [facility] to provide your oncology and hematology care.   If you have a lab appointment with the Cancer Center, please go directly to the Cancer Center and check in at the registration area.  Wear comfortable clothing and clothing appropriate for easy access to any Portacath or PICC line.   We strive to give you quality time with your provider. You may need to reschedule your appointment if you arrive late (15 or more minutes).  Arriving late affects you and other patients whose appointments are after yours.  Also, if you miss three or more appointments without notifying the office, you may be dismissed from the clinic at the provider's discretion.      For prescription refill requests, have your pharmacy contact our office and allow 72 hours for refills to be completed.    Today you received the following chemotherapy and/or immunotherapy agents Velcade      To help prevent nausea and vomiting after your treatment, we encourage you to take your nausea medication as directed.  BELOW ARE SYMPTOMS THAT SHOULD BE REPORTED IMMEDIATELY: *FEVER GREATER THAN 100.4 F (38 C) OR HIGHER *CHILLS OR SWEATING *NAUSEA AND VOMITING THAT IS NOT CONTROLLED WITH YOUR NAUSEA MEDICATION *UNUSUAL SHORTNESS OF BREATH *UNUSUAL BRUISING OR BLEEDING *URINARY PROBLEMS (pain or burning when urinating, or frequent urination) *BOWEL PROBLEMS (unusual diarrhea, constipation, pain near the anus) TENDERNESS IN MOUTH AND THROAT WITH OR WITHOUT PRESENCE OF ULCERS (sore throat, sores in mouth, or a toothache) UNUSUAL RASH, SWELLING OR PAIN  UNUSUAL VAGINAL DISCHARGE OR ITCHING   Items with * indicate a potential emergency and should be followed up as soon as possible or go to the Emergency Department if any problems should occur.  Please show the CHEMOTHERAPY ALERT CARD or IMMUNOTHERAPY ALERT CARD at check-in to the  Emergency Department and triage nurse. Should you have questions after your visit or need to cancel or reschedule your appointment, please contact [facility]  [phone] and follow the prompts.  Office hours are 8:00 a.m. to 4:30 p.m. Monday - Friday. Please note that voicemails left after 4:00 p.m. may not be returned until the following business day.  We are closed weekends and major holidays. You have access to a nurse at all times for urgent questions. Please call the main number to the clinic [phone] and follow the prompts.  For any non-urgent questions, you may also contact your provider using MyChart. We now offer e-Visits for anyone 18 and older to request care online for non-urgent symptoms. For details visit [email or web address].   Also download the MyChart app! Go to the app store, search "MyChart", open the app, select , and log in with your MyChart username and password.  Masks are optional in the cancer centers. If you would like for your care team to wear a mask while they are taking care of you, please let them know. You may have one support person who is at least 82 years old accompany you for your appointments. 

## 2021-10-15 ENCOUNTER — Telehealth: Payer: Self-pay

## 2021-10-15 LAB — KAPPA/LAMBDA LIGHT CHAINS
Kappa free light chain: 107.8 mg/L — ABNORMAL HIGH (ref 3.3–19.4)
Kappa, lambda light chain ratio: 3.27 — ABNORMAL HIGH (ref 0.26–1.65)
Lambda free light chains: 33 mg/L — ABNORMAL HIGH (ref 5.7–26.3)

## 2021-10-15 NOTE — Telephone Encounter (Signed)
-----   Message from [name], MD sent at 10/15/2021  6:27 AM EDT ----- Call - the light chain went from 281 down to 134 in the urine!!!   Great results!!!   Laurey Arrow

## 2021-10-16 LAB — IGG, IGA, IGM
IgA: 626 mg/dL — ABNORMAL HIGH (ref 61–437)
IgG (Immunoglobin G), Serum: 1052 mg/dL (ref 603–1613)
IgM (Immunoglobulin M), Srm: 59 mg/dL (ref 15–143)

## 2021-10-19 LAB — PROTEIN ELECTROPHORESIS, SERUM
A/G Ratio: 1.1 (ref 0.7–1.7)
Albumin ELP: 3.4 g/dL (ref 2.9–4.4)
Alpha-1-Globulin: 0.3 g/dL (ref 0.0–0.4)
Alpha-2-Globulin: 0.7 g/dL (ref 0.4–1.0)
Beta Globulin: 1 g/dL (ref 0.7–1.3)
Gamma Globulin: 1 g/dL (ref 0.4–1.8)
Globulin, Total: 3 g/dL (ref 2.2–3.9)
Total Protein ELP: 6.4 g/dL (ref 6.0–8.5)

## 2021-10-21 ENCOUNTER — Inpatient Hospital Stay (HOSPITAL_BASED_OUTPATIENT_CLINIC_OR_DEPARTMENT_OTHER): Payer: Medicare Other | Admitting: Hematology & Oncology

## 2021-10-21 ENCOUNTER — Encounter: Payer: Self-pay | Admitting: Hematology & Oncology

## 2021-10-21 ENCOUNTER — Other Ambulatory Visit: Payer: Self-pay | Admitting: *Deleted

## 2021-10-21 ENCOUNTER — Other Ambulatory Visit: Payer: Self-pay

## 2021-10-21 ENCOUNTER — Inpatient Hospital Stay: Payer: Medicare Other

## 2021-10-21 VITALS — BP 128/50 | HR 52 | Temp 97.9°F | Resp 16 | Ht 74.5 in | Wt 178.0 lb

## 2021-10-21 DIAGNOSIS — D472 Monoclonal gammopathy: Secondary | ICD-10-CM

## 2021-10-21 DIAGNOSIS — C9000 Multiple myeloma not having achieved remission: Secondary | ICD-10-CM | POA: Diagnosis not present

## 2021-10-21 LAB — CBC WITH DIFFERENTIAL (CANCER CENTER ONLY)
Abs Immature Granulocytes: 0.01 10*3/uL (ref 0.00–0.07)
Basophils Absolute: 0.1 10*3/uL (ref 0.0–0.1)
Basophils Relative: 2 %
Eosinophils Absolute: 0.3 10*3/uL (ref 0.0–0.5)
Eosinophils Relative: 7 %
HCT: 34.4 % — ABNORMAL LOW (ref 39.0–52.0)
Hemoglobin: 10.7 g/dL — ABNORMAL LOW (ref 13.0–17.0)
Immature Granulocytes: 0 %
Lymphocytes Relative: 25 %
Lymphs Abs: 1.2 10*3/uL (ref 0.7–4.0)
MCH: 28.5 pg (ref 26.0–34.0)
MCHC: 31.1 g/dL (ref 30.0–36.0)
MCV: 91.7 fL (ref 80.0–100.0)
Monocytes Absolute: 0.9 10*3/uL (ref 0.1–1.0)
Monocytes Relative: 19 %
Neutro Abs: 2.2 10*3/uL (ref 1.7–7.7)
Neutrophils Relative %: 47 %
Platelet Count: 105 10*3/uL — ABNORMAL LOW (ref 150–400)
RBC: 3.75 MIL/uL — ABNORMAL LOW (ref 4.22–5.81)
RDW: 15.5 % (ref 11.5–15.5)
WBC Count: 4.5 10*3/uL (ref 4.0–10.5)
nRBC: 0 % (ref 0.0–0.2)

## 2021-10-21 LAB — CMP (CANCER CENTER ONLY)
ALT: 13 U/L (ref 0–44)
AST: 14 U/L — ABNORMAL LOW (ref 15–41)
Albumin: 3.8 g/dL (ref 3.5–5.0)
Alkaline Phosphatase: 96 U/L (ref 38–126)
Anion gap: 6 (ref 5–15)
BUN: 18 mg/dL (ref 8–23)
CO2: 31 mmol/L (ref 22–32)
Calcium: 8.9 mg/dL (ref 8.9–10.3)
Chloride: 105 mmol/L (ref 98–111)
Creatinine: 1.64 mg/dL — ABNORMAL HIGH (ref 0.61–1.24)
GFR, Estimated: 42 mL/min — ABNORMAL LOW (ref >60)
Glucose, Bld: 123 mg/dL — ABNORMAL HIGH (ref 70–99)
Potassium: 4.4 mmol/L (ref 3.5–5.1)
Sodium: 142 mmol/L (ref 135–145)
Total Bilirubin: 0.5 mg/dL (ref 0.3–1.2)
Total Protein: 6.8 g/dL (ref 6.5–8.1)

## 2021-10-21 LAB — IMMUNOFIXATION ELECTROPHORESIS
IgA: 637 mg/dL — ABNORMAL HIGH (ref 61–437)
IgG (Immunoglobin G), Serum: 965 mg/dL (ref 603–1613)
IgM (Immunoglobulin M), Srm: 61 mg/dL (ref 15–143)
Total Protein ELP: 6.3 g/dL (ref 6.0–8.5)

## 2021-10-21 LAB — LACTATE DEHYDROGENASE: LDH: 111 U/L (ref 98–192)

## 2021-10-21 MED ORDER — PROCHLORPERAZINE MALEATE 10 MG PO TABS: 10.0000 mg | ORAL_TABLET | Freq: Once | ORAL | Status: DC

## 2021-10-21 MED ORDER — LENALIDOMIDE 10 MG PO CAPS: 10.0000 mg | ORAL_CAPSULE | Freq: Every day | ORAL | 0 refills | Status: DC

## 2021-10-21 MED ORDER — BORTEZOMIB CHEMO SQ INJECTION 3.5 MG (2.5MG/ML)
1.3000 mg/m2 | Freq: Once | INTRAMUSCULAR | Status: AC
  Administered 2021-10-21: 2.75 mg via SUBCUTANEOUS
  Filled 2021-10-21: qty 1.1

## 2021-10-21 NOTE — Progress Notes (Signed)
Hematology and Oncology Follow Up Visit  James Marks [id number] [birth year] 81 y.o. 10/21/2021   Principle Diagnosis:  IgA kappa  myeloma  Current Therapy:   Velcade/Revlimid/Decadron -- s/p cycle #3 -- start on 07/27/2021     Interim History:  James Marks is back for follow-up.  He is responding to treatment.  He did 24-hour urine a week ago.  The Kappa light chain was 134 mg/L.  This was down from 281 mg/L.  His monoclonal protein in the blood was negative.  His IgA level was 626 mg/dL.  His Kappa light chain in the blood was 11 mg/dL.  He feels good.  He has had no problems with the Revlimid.  He has had no issues with nausea or vomiting.  He has had no cough or shortness of breath.  He has had no changes in bowel or bladder habits.  There is been no bleeding.  He has had no leg swelling.  He has had no rashes.  Overall, I would say his performance status is probably ECOG 1.   Medications:  Current Outpatient Medications:    acyclovir (ZOVIRAX) 400 MG tablet, Take 1 tablet (400 mg total) by mouth 2 (two) times daily., Disp: 60 tablet, Rfl: 3   amoxicillin (AMOXIL) 500 MG capsule, Take four tablets (2000 mg) total one hour prior to dental procedure. (Patient not taking: Reported on 07/01/2021), Disp: 4 capsule, Rfl: 0   aspirin 325 MG tablet, Take 325 mg by mouth daily., Disp: , Rfl:    gabapentin (NEURONTIN) 300 MG capsule, TAKE ONE CAPSULE BY MOUTH DAILY WITH BREAKFAST, 1 CAPSULE WITH DINNER AND 2 CAPSULES AT BEDTIME, Disp: 360 capsule, Rfl: 4   lenalidomide (REVLIMID) 10 MG capsule, Take 1 capsule (10 mg total) by mouth daily. Take for 21 days on, 7 days off. Repeat every 28 days. [employer] Auth # [id number], Disp: 21 capsule, Rfl: 0   losartan (COZAAR) 50 MG tablet, TAKE 1 TABLET BY MOUTH  DAILY, Disp: 90 tablet, Rfl: 3   Multiple Vitamins-Minerals (PRESERVISION AREDS 2 PO), Take by mouth daily at 6 (six) AM., Disp: , Rfl:  No current facility-administered medications for this  visit.  Facility-Administered Medications Ordered in Other Visits:    0.9 %  sodium chloride infusion, , Intravenous, Continuous, James Marks, James Cobb, MD, Stopped at 08/05/14 1710   prochlorperazine (COMPAZINE) tablet 10 mg, 10 mg, Oral, Once, James Marks, James Cobb, MD  Allergies: No Known Allergies  Past Medical History, Surgical history, Social history, and Family History were reviewed and updated.  Review of Systems: Review of Systems  Constitutional:  Positive for malaise/fatigue.  HENT:  Positive for congestion.   Eyes: Negative.   Respiratory:  Positive for shortness of breath.   Cardiovascular:  Positive for palpitations.  Gastrointestinal: Negative.   Genitourinary: Negative.   Musculoskeletal:  Positive for back pain and myalgias.  Skin: Negative.   Neurological:  Positive for tingling.  Endo/Heme/Allergies: Negative.   Psychiatric/Behavioral: Negative.      Physical Exam:  height is 6' 2.5" (1.892 m) and weight is 178 lb (80.7 kg). His oral temperature is 97.9 F (36.6 C). His blood pressure is 128/50 (abnormal) and his pulse is 52 (abnormal). His respiration is 16 and oxygen saturation is 100%.   Wt Readings from Last 3 Encounters:  10/21/21 178 lb (80.7 kg)  09/22/21 172 lb 12 oz (78.4 kg)  08/25/21 176 lb (79.8 kg)     Physical Exam Vitals reviewed.  HENT:  Head: Normocephalic and atraumatic.  Eyes:     Pupils: Pupils are equal, round, and reactive to light.  Cardiovascular:     Rate and Rhythm: Normal rate and regular rhythm.     Heart sounds: Normal heart sounds.  Pulmonary:     Effort: Pulmonary effort is normal.     Breath sounds: Normal breath sounds.  Abdominal:     General: Bowel sounds are normal.     Palpations: Abdomen is soft.  Musculoskeletal:        General: No tenderness or deformity. Normal range of motion.     Cervical back: Normal range of motion.  Lymphadenopathy:     Cervical: No cervical adenopathy.  Skin:    General: Skin is warm  and dry.     Findings: No erythema or rash.  Neurological:     Mental Status: He is alert and oriented to person, place, and time.  Psychiatric:        Behavior: Behavior normal.        Thought Content: Thought content normal.        Judgment: Judgment normal.      Lab Results  Component Value Date   WBC 4.5 10/21/2021   HGB 10.7 (L) 10/21/2021   HCT 34.4 (L) 10/21/2021   MCV 91.7 10/21/2021   PLT 105 (L) 10/21/2021     Chemistry      Component Value Date/Time   NA 142 10/21/2021 0917   NA 143 12/06/2016 1156   NA 139 08/02/2016 1146   K 4.4 10/21/2021 0917   K 4.2 12/06/2016 1156   K 4.7 08/02/2016 1146   CL 105 10/21/2021 0917   CL 107 12/06/2016 1156   CO2 31 10/21/2021 0917   CO2 28 12/06/2016 1156   CO2 24 08/02/2016 1146   BUN 18 10/21/2021 0917   BUN 29 (H) 12/06/2016 1156   BUN 34.3 (H) 08/02/2016 1146   CREATININE 1.64 (H) 10/21/2021 0917   CREATININE 2.1 (H) 12/06/2016 1156   CREATININE 1.7 (H) 08/02/2016 1146      Component Value Date/Time   CALCIUM 8.9 10/21/2021 0917   CALCIUM 9.1 12/06/2016 1156   CALCIUM 9.5 08/02/2016 1146   ALKPHOS 96 10/21/2021 0917   ALKPHOS 108 (H) 12/06/2016 1156   ALKPHOS 115 08/02/2016 1146   AST 14 (L) 10/21/2021 0917   AST 14 08/02/2016 1146   ALT 13 10/21/2021 0917   ALT 18 12/06/2016 1156   ALT 9 08/02/2016 1146   BILITOT 0.5 10/21/2021 0917   BILITOT 0.41 08/02/2016 1146      Impression and Plan:  James Marks is 81 year old man with IgA kappa myeloma.    He is responding to treatment.  I am happy about this.  He has had no side effects from treatment so far.  We have continued to see a response, maybe we can pull back a little bit on the Velcade injections and do these every other week.  I am just very pleased that he and his wife can do what they would like to do.  They are going to go to the beach next week.  We will plan to get him back in another month for his fourth cycle of treatment.   James Napoleon, MD 10/11/202310:18 AM

## 2021-10-21 NOTE — Patient Instructions (Signed)
[facility]  Discharge Instructions: Thank you for choosing [facility] to provide your oncology and hematology care.   If you have a lab appointment with the Cancer Center, please go directly to the Cancer Center and check in at the registration area.  Wear comfortable clothing and clothing appropriate for easy access to any Portacath or PICC line.   We strive to give you quality time with your provider. You may need to reschedule your appointment if you arrive late (15 or more minutes).  Arriving late affects you and other patients whose appointments are after yours.  Also, if you miss three or more appointments without notifying the office, you may be dismissed from the clinic at the provider's discretion.      For prescription refill requests, have your pharmacy contact our office and allow 72 hours for refills to be completed.    Today you received the following chemotherapy and/or immunotherapy agents Velcade      To help prevent nausea and vomiting after your treatment, we encourage you to take your nausea medication as directed.  BELOW ARE SYMPTOMS THAT SHOULD BE REPORTED IMMEDIATELY: *FEVER GREATER THAN 100.4 F (38 C) OR HIGHER *CHILLS OR SWEATING *NAUSEA AND VOMITING THAT IS NOT CONTROLLED WITH YOUR NAUSEA MEDICATION *UNUSUAL SHORTNESS OF BREATH *UNUSUAL BRUISING OR BLEEDING *URINARY PROBLEMS (pain or burning when urinating, or frequent urination) *BOWEL PROBLEMS (unusual diarrhea, constipation, pain near the anus) TENDERNESS IN MOUTH AND THROAT WITH OR WITHOUT PRESENCE OF ULCERS (sore throat, sores in mouth, or a toothache) UNUSUAL RASH, SWELLING OR PAIN  UNUSUAL VAGINAL DISCHARGE OR ITCHING   Items with * indicate a potential emergency and should be followed up as soon as possible or go to the Emergency Department if any problems should occur.  Please show the CHEMOTHERAPY ALERT CARD or IMMUNOTHERAPY ALERT CARD at check-in to the  Emergency Department and triage nurse. Should you have questions after your visit or need to cancel or reschedule your appointment, please contact [facility]  [phone] and follow the prompts.  Office hours are 8:00 a.m. to 4:30 p.m. Monday - Friday. Please note that voicemails left after 4:00 p.m. may not be returned until the following business day.  We are closed weekends and major holidays. You have access to a nurse at all times for urgent questions. Please call the main number to the clinic [phone] and follow the prompts.  For any non-urgent questions, you may also contact your provider using MyChart. We now offer e-Visits for anyone 18 and older to request care online for non-urgent symptoms. For details visit [email or web address].   Also download the MyChart app! Go to the app store, search "MyChart", open the app, select [facility], and log in with your MyChart username and password.  Masks are optional in the cancer centers. If you would like for your care team to wear a mask while they are taking care of you, please let them know. You may have one support person who is at least 82 years old accompany you for your appointments. 

## 2021-10-21 NOTE — Progress Notes (Signed)
Reviewed labs with Dr. Marin Olp and pt ok to treat with creatinine 1.64

## 2021-10-22 ENCOUNTER — Other Ambulatory Visit: Payer: Self-pay | Admitting: *Deleted

## 2021-10-22 ENCOUNTER — Telehealth: Payer: Self-pay | Admitting: *Deleted

## 2021-10-22 DIAGNOSIS — L820 Inflamed seborrheic keratosis: Secondary | ICD-10-CM | POA: Diagnosis not present

## 2021-10-22 DIAGNOSIS — Z85828 Personal history of other malignant neoplasm of skin: Secondary | ICD-10-CM | POA: Diagnosis not present

## 2021-10-22 DIAGNOSIS — D485 Neoplasm of uncertain behavior of skin: Secondary | ICD-10-CM | POA: Diagnosis not present

## 2021-10-22 LAB — IGG, IGA, IGM
IgA: 608 mg/dL — ABNORMAL HIGH (ref 61–437)
IgG (Immunoglobin G), Serum: 992 mg/dL (ref 603–1613)
IgM (Immunoglobulin M), Srm: 61 mg/dL (ref 15–143)

## 2021-10-22 LAB — KAPPA/LAMBDA LIGHT CHAINS
Kappa free light chain: 146.7 mg/L — ABNORMAL HIGH (ref 3.3–19.4)
Kappa, lambda light chain ratio: 3.58 — ABNORMAL HIGH (ref 0.26–1.65)
Lambda free light chains: 41 mg/L — ABNORMAL HIGH (ref 5.7–26.3)

## 2021-10-22 LAB — BETA 2 MICROGLOBULIN, SERUM: Beta-2 Microglobulin: 3.6 mg/L — ABNORMAL HIGH (ref 0.6–2.4)

## 2021-10-22 MED ORDER — DOXYCYCLINE HYCLATE 100 MG PO TABS: 100.0000 mg | ORAL_TABLET | Freq: Two times a day (BID) | ORAL | 0 refills | Status: DC

## 2021-10-22 NOTE — Telephone Encounter (Signed)
Call received from patient's wife stating that pt has a mole that is oozing pus on his arm and would like to know if Dr. Marin Olp would like for pt to be on an antibiotic.  She states that dermatology is not able to get pt in for an appt until next Tuesday.  Dr. Marin Olp notified and order received for pt to take Doxycyline 100 mg twice a day times seven days.  Pt.'s wife notified of prescription and is appreciative of call back.

## 2021-10-23 ENCOUNTER — Telehealth: Payer: Self-pay | Admitting: Family Medicine

## 2021-10-23 DIAGNOSIS — H35033 Hypertensive retinopathy, bilateral: Secondary | ICD-10-CM | POA: Diagnosis not present

## 2021-10-23 DIAGNOSIS — H43813 Vitreous degeneration, bilateral: Secondary | ICD-10-CM | POA: Diagnosis not present

## 2021-10-23 DIAGNOSIS — H353132 Nonexudative age-related macular degeneration, bilateral, intermediate dry stage: Secondary | ICD-10-CM | POA: Diagnosis not present

## 2021-10-23 DIAGNOSIS — H34812 Central retinal vein occlusion, left eye, with macular edema: Secondary | ICD-10-CM | POA: Diagnosis not present

## 2021-10-23 MED ORDER — PREDNISONE 20 MG PO TABS: ORAL_TABLET | ORAL | 0 refills | Status: DC

## 2021-10-23 NOTE — Addendum Note (Signed)
Addended by: Agnes Lawrence on: 10/23/2021 01:29 PM   Modules accepted: Orders

## 2021-10-23 NOTE — Telephone Encounter (Signed)
Pt requesting prescription for prednisopredniSONE (DELTASONE) 10 MG tablet ne for his reoccurring gout.

## 2021-10-23 NOTE — Telephone Encounter (Signed)
Rx done.  Spoke with the patient's wife and informed her of the message below.

## 2021-10-28 LAB — PROTEIN ELECTROPHORESIS, SERUM, WITH REFLEX
A/G Ratio: 1.1 (ref 0.7–1.7)
Albumin ELP: 3.3 g/dL (ref 2.9–4.4)
Alpha-1-Globulin: 0.3 g/dL (ref 0.0–0.4)
Alpha-2-Globulin: 0.6 g/dL (ref 0.4–1.0)
Beta Globulin: 1 g/dL (ref 0.7–1.3)
Gamma Globulin: 1.1 g/dL (ref 0.4–1.8)
Globulin, Total: 3 g/dL (ref 2.2–3.9)
M-Spike, %: 0.6 g/dL — ABNORMAL HIGH
SPEP Interpretation: 0
Total Protein ELP: 6.3 g/dL (ref 6.0–8.5)

## 2021-10-28 LAB — IMMUNOFIXATION REFLEX, SERUM
IgA: 660 mg/dL — ABNORMAL HIGH (ref 61–437)
IgG (Immunoglobin G), Serum: 1044 mg/dL (ref 603–1613)
IgM (Immunoglobulin M), Srm: 63 mg/dL (ref 15–143)

## 2021-11-04 ENCOUNTER — Inpatient Hospital Stay: Payer: Medicare Other

## 2021-11-04 ENCOUNTER — Other Ambulatory Visit: Payer: Self-pay | Admitting: Family

## 2021-11-04 ENCOUNTER — Other Ambulatory Visit: Payer: Self-pay

## 2021-11-04 VITALS — BP 133/61 | HR 53 | Temp 98.0°F | Resp 17

## 2021-11-04 DIAGNOSIS — M109 Gout, unspecified: Secondary | ICD-10-CM

## 2021-11-04 DIAGNOSIS — D472 Monoclonal gammopathy: Secondary | ICD-10-CM

## 2021-11-04 DIAGNOSIS — C9000 Multiple myeloma not having achieved remission: Secondary | ICD-10-CM | POA: Diagnosis not present

## 2021-11-04 LAB — CBC WITH DIFFERENTIAL (CANCER CENTER ONLY)
Abs Immature Granulocytes: 0.01 10*3/uL (ref 0.00–0.07)
Basophils Absolute: 0.1 10*3/uL (ref 0.0–0.1)
Basophils Relative: 3 %
Eosinophils Absolute: 0.1 10*3/uL (ref 0.0–0.5)
Eosinophils Relative: 2 %
HCT: 33.8 % — ABNORMAL LOW (ref 39.0–52.0)
Hemoglobin: 10.4 g/dL — ABNORMAL LOW (ref 13.0–17.0)
Immature Granulocytes: 0 %
Lymphocytes Relative: 27 %
Lymphs Abs: 1.3 10*3/uL (ref 0.7–4.0)
MCH: 28.6 pg (ref 26.0–34.0)
MCHC: 30.8 g/dL (ref 30.0–36.0)
MCV: 92.9 fL (ref 80.0–100.0)
Monocytes Absolute: 0.7 10*3/uL (ref 0.1–1.0)
Monocytes Relative: 16 %
Neutro Abs: 2.5 10*3/uL (ref 1.7–7.7)
Neutrophils Relative %: 52 %
Platelet Count: 195 10*3/uL (ref 150–400)
RBC: 3.64 MIL/uL — ABNORMAL LOW (ref 4.22–5.81)
RDW: 15.9 % — ABNORMAL HIGH (ref 11.5–15.5)
WBC Count: 4.7 10*3/uL (ref 4.0–10.5)
nRBC: 0 % (ref 0.0–0.2)

## 2021-11-04 LAB — CMP (CANCER CENTER ONLY)
ALT: 12 U/L (ref 0–44)
AST: 12 U/L — ABNORMAL LOW (ref 15–41)
Albumin: 3.7 g/dL (ref 3.5–5.0)
Alkaline Phosphatase: 86 U/L (ref 38–126)
Anion gap: 7 (ref 5–15)
BUN: 19 mg/dL (ref 8–23)
CO2: 28 mmol/L (ref 22–32)
Calcium: 8.9 mg/dL (ref 8.9–10.3)
Chloride: 107 mmol/L (ref 98–111)
Creatinine: 1.51 mg/dL — ABNORMAL HIGH (ref 0.61–1.24)
GFR, Estimated: 46 mL/min — ABNORMAL LOW (ref >60)
Glucose, Bld: 116 mg/dL — ABNORMAL HIGH (ref 70–99)
Potassium: 4.5 mmol/L (ref 3.5–5.1)
Sodium: 142 mmol/L (ref 135–145)
Total Bilirubin: 0.4 mg/dL (ref 0.3–1.2)
Total Protein: 6.5 g/dL (ref 6.5–8.1)

## 2021-11-04 MED ORDER — COLCHICINE 0.6 MG PO TABS: 0.6000 mg | ORAL_TABLET | Freq: Every day | ORAL | 0 refills | Status: DC

## 2021-11-04 MED ORDER — BORTEZOMIB CHEMO SQ INJECTION 3.5 MG (2.5MG/ML)
1.3000 mg/m2 | Freq: Once | INTRAMUSCULAR | Status: AC
  Administered 2021-11-04: 2.75 mg via SUBCUTANEOUS
  Filled 2021-11-04: qty 1.1

## 2021-11-04 MED ORDER — PROCHLORPERAZINE MALEATE 10 MG PO TABS: 10.0000 mg | ORAL_TABLET | Freq: Once | ORAL | Status: DC

## 2021-11-04 NOTE — Progress Notes (Signed)
Ok to treat with creatinine of 1.51 per Judson Roch, NP. dph

## 2021-11-04 NOTE — Patient Instructions (Signed)
[facility]  Discharge Instructions: Thank you for choosing [facility] to provide your oncology and hematology care.   If you have a lab appointment with the Cancer Center, please go directly to the Cancer Center and check in at the registration area.  Wear comfortable clothing and clothing appropriate for easy access to any Portacath or PICC line.   We strive to give you quality time with your provider. You may need to reschedule your appointment if you arrive late (15 or more minutes).  Arriving late affects you and other patients whose appointments are after yours.  Also, if you miss three or more appointments without notifying the office, you may be dismissed from the clinic at the provider's discretion.      For prescription refill requests, have your pharmacy contact our office and allow 72 hours for refills to be completed.    Today you received the following chemotherapy and/or immunotherapy agents Velcade      To help prevent nausea and vomiting after your treatment, we encourage you to take your nausea medication as directed.  BELOW ARE SYMPTOMS THAT SHOULD BE REPORTED IMMEDIATELY: *FEVER GREATER THAN 100.4 F (38 C) OR HIGHER *CHILLS OR SWEATING *NAUSEA AND VOMITING THAT IS NOT CONTROLLED WITH YOUR NAUSEA MEDICATION *UNUSUAL SHORTNESS OF BREATH *UNUSUAL BRUISING OR BLEEDING *URINARY PROBLEMS (pain or burning when urinating, or frequent urination) *BOWEL PROBLEMS (unusual diarrhea, constipation, pain near the anus) TENDERNESS IN MOUTH AND THROAT WITH OR WITHOUT PRESENCE OF ULCERS (sore throat, sores in mouth, or a toothache) UNUSUAL RASH, SWELLING OR PAIN  UNUSUAL VAGINAL DISCHARGE OR ITCHING   Items with * indicate a potential emergency and should be followed up as soon as possible or go to the Emergency Department if any problems should occur.  Please show the CHEMOTHERAPY ALERT CARD or IMMUNOTHERAPY ALERT CARD at check-in to the  Emergency Department and triage nurse. Should you have questions after your visit or need to cancel or reschedule your appointment, please contact [facility]  [phone] and follow the prompts.  Office hours are 8:00 a.m. to 4:30 p.m. Monday - Friday. Please note that voicemails left after 4:00 p.m. may not be returned until the following business day.  We are closed weekends and major holidays. You have access to a nurse at all times for urgent questions. Please call the main number to the clinic [phone] and follow the prompts.  For any non-urgent questions, you may also contact your provider using MyChart. We now offer e-Visits for anyone 18 and older to request care online for non-urgent symptoms. For details visit [email or web address].   Also download the MyChart app! Go to the app store, search "MyChart", open the app, select [facility], and log in with your MyChart username and password.  Masks are optional in the cancer centers. If you would like for your care team to wear a mask while they are taking care of you, please let them know. You may have one support person who is at least 82 years old accompany you for your appointments. 

## 2021-11-10 ENCOUNTER — Other Ambulatory Visit: Payer: Self-pay

## 2021-11-10 DIAGNOSIS — M109 Gout, unspecified: Secondary | ICD-10-CM

## 2021-11-10 DIAGNOSIS — D472 Monoclonal gammopathy: Secondary | ICD-10-CM

## 2021-11-11 ENCOUNTER — Inpatient Hospital Stay: Payer: Medicare Other | Attending: Hematology & Oncology

## 2021-11-11 ENCOUNTER — Inpatient Hospital Stay: Payer: Medicare Other

## 2021-11-11 VITALS — BP 148/70 | HR 47 | Temp 97.8°F | Resp 17

## 2021-11-11 DIAGNOSIS — Z7961 Long term (current) use of immunomodulator: Secondary | ICD-10-CM | POA: Insufficient documentation

## 2021-11-11 DIAGNOSIS — D472 Monoclonal gammopathy: Secondary | ICD-10-CM

## 2021-11-11 DIAGNOSIS — C9000 Multiple myeloma not having achieved remission: Secondary | ICD-10-CM | POA: Insufficient documentation

## 2021-11-11 DIAGNOSIS — R5383 Other fatigue: Secondary | ICD-10-CM | POA: Insufficient documentation

## 2021-11-11 DIAGNOSIS — Z7982 Long term (current) use of aspirin: Secondary | ICD-10-CM | POA: Insufficient documentation

## 2021-11-11 DIAGNOSIS — M549 Dorsalgia, unspecified: Secondary | ICD-10-CM | POA: Insufficient documentation

## 2021-11-11 LAB — CMP (CANCER CENTER ONLY)
ALT: 10 U/L (ref 0–44)
AST: 11 U/L — ABNORMAL LOW (ref 15–41)
Albumin: 3.9 g/dL (ref 3.5–5.0)
Alkaline Phosphatase: 96 U/L (ref 38–126)
Anion gap: 6 (ref 5–15)
BUN: 22 mg/dL (ref 8–23)
CO2: 29 mmol/L (ref 22–32)
Calcium: 9 mg/dL (ref 8.9–10.3)
Chloride: 107 mmol/L (ref 98–111)
Creatinine: 1.65 mg/dL — ABNORMAL HIGH (ref 0.61–1.24)
GFR, Estimated: 41 mL/min — ABNORMAL LOW (ref >60)
Glucose, Bld: 102 mg/dL — ABNORMAL HIGH (ref 70–99)
Potassium: 4.3 mmol/L (ref 3.5–5.1)
Sodium: 142 mmol/L (ref 135–145)
Total Bilirubin: 0.4 mg/dL (ref 0.3–1.2)
Total Protein: 6.9 g/dL (ref 6.5–8.1)

## 2021-11-11 LAB — CBC WITH DIFFERENTIAL (CANCER CENTER ONLY)
Abs Immature Granulocytes: 0.02 10*3/uL (ref 0.00–0.07)
Basophils Absolute: 0.1 10*3/uL (ref 0.0–0.1)
Basophils Relative: 3 %
Eosinophils Absolute: 0.2 10*3/uL (ref 0.0–0.5)
Eosinophils Relative: 5 %
HCT: 34.7 % — ABNORMAL LOW (ref 39.0–52.0)
Hemoglobin: 10.7 g/dL — ABNORMAL LOW (ref 13.0–17.0)
Immature Granulocytes: 0 %
Lymphocytes Relative: 28 %
Lymphs Abs: 1.3 10*3/uL (ref 0.7–4.0)
MCH: 28.5 pg (ref 26.0–34.0)
MCHC: 30.8 g/dL (ref 30.0–36.0)
MCV: 92.5 fL (ref 80.0–100.0)
Monocytes Absolute: 0.4 10*3/uL (ref 0.1–1.0)
Monocytes Relative: 9 %
Neutro Abs: 2.5 10*3/uL (ref 1.7–7.7)
Neutrophils Relative %: 55 %
Platelet Count: 151 10*3/uL (ref 150–400)
RBC: 3.75 MIL/uL — ABNORMAL LOW (ref 4.22–5.81)
RDW: 16.1 % — ABNORMAL HIGH (ref 11.5–15.5)
WBC Count: 4.6 10*3/uL (ref 4.0–10.5)
nRBC: 0 % (ref 0.0–0.2)

## 2021-11-11 MED ORDER — PROCHLORPERAZINE MALEATE 10 MG PO TABS: 10.0000 mg | ORAL_TABLET | Freq: Once | ORAL | Status: DC

## 2021-11-11 MED ORDER — BORTEZOMIB CHEMO SQ INJECTION 3.5 MG (2.5MG/ML)
1.3000 mg/m2 | Freq: Once | INTRAMUSCULAR | Status: AC
  Administered 2021-11-11: 2.75 mg via SUBCUTANEOUS
  Filled 2021-11-11: qty 1.1

## 2021-11-11 NOTE — Progress Notes (Signed)
Ok to treat with Creatinine of 1.65 per Dr Marin Olp

## 2021-11-11 NOTE — Patient Instructions (Signed)
Bortezomib Injection What is this medication? BORTEZOMIB (bor TEZ oh mib) treats lymphoma. It may also be used to treat multiple myeloma, a type of bone marrow cancer. It works by blocking a protein that causes cancer cells to grow and multiply. This helps to slow or stop the spread of cancer cells. This medicine may be used for other purposes; ask your health care provider or pharmacist if you have questions. COMMON BRAND NAME(S): Velcade What should I tell my care team before I take this medication? They need to know if you have any of these conditions: Dehydration Diabetes Heart disease Liver disease Tingling of the fingers or toes or other nerve disorder An unusual or allergic reaction to bortezomib, other medications, foods, dyes, or preservatives If you or your partner are pregnant or trying to get pregnant Breastfeeding How should I use this medication? This medication is injected into a vein or under the skin. It is given by your care team in a hospital or clinic setting. Talk to your care team about the use of this medication in children. Special care may be needed. Overdosage: If you think you have taken too much of this medicine contact a poison control center or emergency room at once. NOTE: This medicine is only for you. Do not share this medicine with others. What if I miss a dose? Keep appointments for follow-up doses. It is important not to miss your dose. Call your care team if you are unable to keep an appointment. What may interact with this medication? Ketoconazole Rifampin This list may not describe all possible interactions. Give your health care provider a list of all the medicines, herbs, non-prescription drugs, or dietary supplements you use. Also tell them if you smoke, drink alcohol, or use illegal drugs. Some items may interact with your medicine. What should I watch for while using this medication? Your condition will be monitored carefully while you are  receiving this medication. You may need blood work while taking this medication. This medication may affect your coordination, reaction time, or judgment. Do not drive or operate machinery until you know how this medication affects you. Sit up or stand slowly to reduce the risk of dizzy or fainting spells. Drinking alcohol with this medication can increase the risk of these side effects. This medication may increase your risk of getting an infection. Call your care team for advice if you get a fever, chills, sore throat, or other symptoms of a cold or flu. Do not treat yourself. Try to avoid being around people who are sick. Check with your care team if you have severe diarrhea, nausea, and vomiting, or if you sweat a lot. The loss of too much body fluid may make it dangerous for you to take this medication. Talk to your care team if you may be pregnant. Serious birth defects can occur if you take this medication during pregnancy and for 7 months after the last dose. You will need a negative pregnancy test before starting this medication. Contraception is recommended while taking this medication and for 7 months after the last dose. Your care team can help you find the option that works for you. If your partner can get pregnant, use a condom during sex while taking this medication and for 4 months after the last dose. Do not breastfeed while taking this medication and for 2 months after the last dose. This medication may cause infertility. Talk to your care team if you are concerned about your fertility. What side effects  may I notice from receiving this medication? Side effects that you should report to your care team as soon as possible: Allergic reactions--skin rash, itching, hives, swelling of the face, lips, tongue, or throat Bleeding--bloody or black, tar-like stools, vomiting blood or brown material that looks like coffee grounds, red or dark brown urine, small red or purple spots on skin, unusual  bruising or bleeding Bleeding in the brain--severe headache, stiff neck, confusion, dizziness, change in vision, numbness or weakness of the face, arm, or leg, trouble speaking, trouble walking, vomiting Bowel blockage--stomach cramping, unable to have a bowel movement or pass gas, loss of appetite, vomiting Heart failure--shortness of breath, swelling of the ankles, feet, or hands, sudden weight gain, unusual weakness or fatigue Infection--fever, chills, cough, sore throat, wounds that don't heal, pain or trouble when passing urine, general feeling of discomfort or being unwell Liver injury--right upper belly pain, loss of appetite, nausea, light-colored stool, dark yellow or brown urine, yellowing skin or eyes, unusual weakness or fatigue Low blood pressure--dizziness, feeling faint or lightheaded, blurry vision Lung injury--shortness of breath or trouble breathing, cough, spitting up blood, chest pain, fever Pain, tingling, or numbness in the hands or feet Severe or prolonged diarrhea Stomach pain, bloody diarrhea, pale skin, unusual weakness or fatigue, decrease in the amount of urine, which may be signs of hemolytic uremic syndrome Sudden and severe headache, confusion, change in vision, seizures, which may be signs of posterior reversible encephalopathy syndrome (PRES) TTP--purple spots on the skin or inside the mouth, pale skin, yellowing skin or eyes, unusual weakness or fatigue, fever, fast or irregular heartbeat, confusion, change in vision, trouble speaking, trouble walking Tumor lysis syndrome (TLS)--nausea, vomiting, diarrhea, decrease in the amount of urine, dark urine, unusual weakness or fatigue, confusion, muscle pain or cramps, fast or irregular heartbeat, joint pain Side effects that usually do not require medical attention (report to your care team if they continue or are bothersome): Constipation Diarrhea Fatigue Loss of appetite Nausea This list may not describe all possible  side effects. Call your doctor for medical advice about side effects. You may report side effects to FDA at 1-800-FDA-1088. Where should I keep my medication? This medication is given in a hospital or clinic. It will not be stored at home. NOTE: This sheet is a summary. It may not cover all possible information. If you have questions about this medicine, talk to your doctor, pharmacist, or health care provider.  2023 Elsevier/Gold Standard (2021-05-27 00:00:00)

## 2021-11-12 ENCOUNTER — Other Ambulatory Visit: Payer: Self-pay | Admitting: Family Medicine

## 2021-11-17 ENCOUNTER — Other Ambulatory Visit: Payer: Self-pay | Admitting: *Deleted

## 2021-11-17 DIAGNOSIS — D472 Monoclonal gammopathy: Secondary | ICD-10-CM

## 2021-11-18 ENCOUNTER — Other Ambulatory Visit: Payer: Self-pay

## 2021-11-18 ENCOUNTER — Inpatient Hospital Stay: Payer: Medicare Other

## 2021-11-18 ENCOUNTER — Encounter: Payer: Self-pay | Admitting: Hematology & Oncology

## 2021-11-18 ENCOUNTER — Inpatient Hospital Stay (HOSPITAL_BASED_OUTPATIENT_CLINIC_OR_DEPARTMENT_OTHER): Payer: Medicare Other | Admitting: Hematology & Oncology

## 2021-11-18 VITALS — BP 147/53 | HR 47 | Temp 97.6°F | Resp 16 | Ht 74.5 in | Wt 175.0 lb

## 2021-11-18 DIAGNOSIS — C9000 Multiple myeloma not having achieved remission: Secondary | ICD-10-CM | POA: Diagnosis not present

## 2021-11-18 DIAGNOSIS — D472 Monoclonal gammopathy: Secondary | ICD-10-CM

## 2021-11-18 DIAGNOSIS — R7871 Abnormal lead level in blood: Secondary | ICD-10-CM | POA: Diagnosis not present

## 2021-11-18 LAB — CBC WITH DIFFERENTIAL (CANCER CENTER ONLY)
Abs Immature Granulocytes: 0.01 10*3/uL (ref 0.00–0.07)
Basophils Absolute: 0.1 10*3/uL (ref 0.0–0.1)
Basophils Relative: 2 %
Eosinophils Absolute: 0.3 10*3/uL (ref 0.0–0.5)
Eosinophils Relative: 7 %
HCT: 32.7 % — ABNORMAL LOW (ref 39.0–52.0)
Hemoglobin: 10.3 g/dL — ABNORMAL LOW (ref 13.0–17.0)
Immature Granulocytes: 0 %
Lymphocytes Relative: 33 %
Lymphs Abs: 1.5 10*3/uL (ref 0.7–4.0)
MCH: 29.2 pg (ref 26.0–34.0)
MCHC: 31.5 g/dL (ref 30.0–36.0)
MCV: 92.6 fL (ref 80.0–100.0)
Monocytes Absolute: 0.9 10*3/uL (ref 0.1–1.0)
Monocytes Relative: 20 %
Neutro Abs: 1.8 10*3/uL (ref 1.7–7.7)
Neutrophils Relative %: 38 %
Platelet Count: 125 10*3/uL — ABNORMAL LOW (ref 150–400)
RBC: 3.53 MIL/uL — ABNORMAL LOW (ref 4.22–5.81)
RDW: 16.2 % — ABNORMAL HIGH (ref 11.5–15.5)
WBC Count: 4.6 10*3/uL (ref 4.0–10.5)
nRBC: 0 % (ref 0.0–0.2)

## 2021-11-18 LAB — CMP (CANCER CENTER ONLY)
ALT: 11 U/L (ref 0–44)
AST: 11 U/L — ABNORMAL LOW (ref 15–41)
Albumin: 3.8 g/dL (ref 3.5–5.0)
Alkaline Phosphatase: 89 U/L (ref 38–126)
Anion gap: 5 (ref 5–15)
BUN: 22 mg/dL (ref 8–23)
CO2: 30 mmol/L (ref 22–32)
Calcium: 8.9 mg/dL (ref 8.9–10.3)
Chloride: 107 mmol/L (ref 98–111)
Creatinine: 1.46 mg/dL — ABNORMAL HIGH (ref 0.61–1.24)
GFR, Estimated: 48 mL/min — ABNORMAL LOW (ref >60)
Glucose, Bld: 111 mg/dL — ABNORMAL HIGH (ref 70–99)
Potassium: 4.7 mmol/L (ref 3.5–5.1)
Sodium: 142 mmol/L (ref 135–145)
Total Bilirubin: 0.4 mg/dL (ref 0.3–1.2)
Total Protein: 6.7 g/dL (ref 6.5–8.1)

## 2021-11-18 LAB — LACTATE DEHYDROGENASE: LDH: 109 U/L (ref 98–192)

## 2021-11-18 MED ORDER — PROCHLORPERAZINE MALEATE 10 MG PO TABS: 10.0000 mg | ORAL_TABLET | Freq: Once | ORAL | Status: DC

## 2021-11-18 MED ORDER — BORTEZOMIB CHEMO SQ INJECTION 3.5 MG (2.5MG/ML)
1.3000 mg/m2 | Freq: Once | INTRAMUSCULAR | Status: AC
  Administered 2021-11-18: 2.75 mg via SUBCUTANEOUS
  Filled 2021-11-18: qty 1.1

## 2021-11-18 NOTE — Progress Notes (Signed)
Hematology and Oncology Follow Up Visit  James Marks [id number] 1940-02-21 81 y.o. 11/18/2021   Principle Diagnosis:  IgA kappa  myeloma  Current Therapy:   Velcade/Revlimid/Decadron -- s/p cycle #4 -- start on 07/27/2021     Interim History:  James Marks is back for follow-up.  His heart is still promptings looking with respect to the myeloma.  We last saw him, his monoclonal spike was 0.6 g/dL.  When we had checked before, it was not observed.  The Kappa light chain was 14.7 mg/dL.  His IgA level was 630 mg/dL.  Again it is hard to say if we have seen a response that is continued.  He had been responding nicely.  If not, there will go to have to add Faspro.  He seems to be tolerating treatment okay.  He is having no problems with the Revlimid.  There is no tingling in the hands or feet.  He played golf yesterday.  He enjoyed this.  His appetite has been okay.  He has had no change in bowel or bladder habits.  He has had no cough or shortness of breath.  He has had no rashes.  Is been no leg swelling.  Overall, I would say his performance status is probably ECOG 1.    Medications:  Current Outpatient Medications:    acyclovir (ZOVIRAX) 400 MG tablet, Take 1 tablet (400 mg total) by mouth 2 (two) times daily., Disp: 60 tablet, Rfl: 3   amoxicillin (AMOXIL) 500 MG capsule, Take four tablets (2000 mg) total one hour prior to dental procedure. (Patient not taking: Reported on 07/01/2021), Disp: 4 capsule, Rfl: 0   aspirin 325 MG tablet, Take 325 mg by mouth daily., Disp: , Rfl:    colchicine 0.6 MG tablet, Take 1 tablet (0.6 mg total) by mouth daily. (Patient not taking: Reported on 11/18/2021), Disp: 30 tablet, Rfl: 0   gabapentin (NEURONTIN) 300 MG capsule, TAKE ONE CAPSULE BY MOUTH DAILY WITH BREAKFAST, 1 CAPSULE WITH DINNER AND 2 CAPSULES AT BEDTIME, Disp: 360 capsule, Rfl: 4   lenalidomide (REVLIMID) 10 MG capsule, Take 1 capsule (10 mg total) by mouth daily. Take for 21 days on,  7 days off. Repeat every 28 days. [employer] Auth # [id number], Disp: 21 capsule, Rfl: 0   losartan (COZAAR) 50 MG tablet, TAKE 1 TABLET BY MOUTH DAILY, Disp: 90 tablet, Rfl: 0   Multiple Vitamins-Minerals (PRESERVISION AREDS 2 PO), Take by mouth daily at 6 (six) AM., Disp: , Rfl:  No current facility-administered medications for this visit.  Facility-Administered Medications Ordered in Other Visits:    0.9 %  sodium chloride infusion, , Intravenous, Continuous, Ramces Shomaker, Rudell Cobb, MD, Stopped at 08/05/14 1710   prochlorperazine (COMPAZINE) tablet 10 mg, 10 mg, Oral, Once, Mozetta Murfin, Rudell Cobb, MD  Allergies: No Known Allergies  Past Medical History, Surgical history, Social history, and Family History were reviewed and updated.  Review of Systems: Review of Systems  Constitutional:  Positive for malaise/fatigue.  HENT:  Positive for congestion.   Eyes: Negative.   Respiratory:  Positive for shortness of breath.   Cardiovascular:  Positive for palpitations.  Gastrointestinal: Negative.   Genitourinary: Negative.   Musculoskeletal:  Positive for back pain and myalgias.  Skin: Negative.   Neurological:  Positive for tingling.  Endo/Heme/Allergies: Negative.   Psychiatric/Behavioral: Negative.      Physical Exam:  height is 6' 2.5" (1.892 m) and weight is 175 lb (79.4 kg). His oral temperature is 97.6 F (36.4 C).  His blood pressure is 147/53 (abnormal) and his pulse is 47 (abnormal). His respiration is 16 and oxygen saturation is 100%.   Wt Readings from Last 3 Encounters:  11/18/21 175 lb (79.4 kg)  10/21/21 178 lb (80.7 kg)  09/22/21 172 lb 12 oz (78.4 kg)     Physical Exam Vitals reviewed.  HENT:     Head: Normocephalic and atraumatic.  Eyes:     Pupils: Pupils are equal, round, and reactive to light.  Cardiovascular:     Rate and Rhythm: Normal rate and regular rhythm.     Heart sounds: Normal heart sounds.  Pulmonary:     Effort: Pulmonary effort is normal.     Breath  sounds: Normal breath sounds.  Abdominal:     General: Bowel sounds are normal.     Palpations: Abdomen is soft.  Musculoskeletal:        General: No tenderness or deformity. Normal range of motion.     Cervical back: Normal range of motion.  Lymphadenopathy:     Cervical: No cervical adenopathy.  Skin:    General: Skin is warm and dry.     Findings: No erythema or rash.  Neurological:     Mental Status: He is alert and oriented to person, place, and time.  Psychiatric:        Behavior: Behavior normal.        Thought Content: Thought content normal.        Judgment: Judgment normal.      Lab Results  Component Value Date   WBC 4.6 11/18/2021   HGB 10.3 (L) 11/18/2021   HCT 32.7 (L) 11/18/2021   MCV 92.6 11/18/2021   PLT 125 (L) 11/18/2021     Chemistry      Component Value Date/Time   NA 142 11/11/2021 1002   NA 143 12/06/2016 1156   NA 139 08/02/2016 1146   K 4.3 11/11/2021 1002   K 4.2 12/06/2016 1156   K 4.7 08/02/2016 1146   CL 107 11/11/2021 1002   CL 107 12/06/2016 1156   CO2 29 11/11/2021 1002   CO2 28 12/06/2016 1156   CO2 24 08/02/2016 1146   BUN 22 11/11/2021 1002   BUN 29 (H) 12/06/2016 1156   BUN 34.3 (H) 08/02/2016 1146   CREATININE 1.65 (H) 11/11/2021 1002   CREATININE 2.1 (H) 12/06/2016 1156   CREATININE 1.7 (H) 08/02/2016 1146      Component Value Date/Time   CALCIUM 9.0 11/11/2021 1002   CALCIUM 9.1 12/06/2016 1156   CALCIUM 9.5 08/02/2016 1146   ALKPHOS 96 11/11/2021 1002   ALKPHOS 108 (H) 12/06/2016 1156   ALKPHOS 115 08/02/2016 1146   AST 11 (L) 11/11/2021 1002   AST 14 08/02/2016 1146   ALT 10 11/11/2021 1002   ALT 18 12/06/2016 1156   ALT 9 08/02/2016 1146   BILITOT 0.4 11/11/2021 1002   BILITOT 0.41 08/02/2016 1146      Impression and Plan:  James Marks is 81 year old man with IgA kappa myeloma.    We will be very interesting to see what his myeloma studies look like now.  If we do not see a response, then we will going  to have to add Faspro.  Again I just want to make sure that his quality of life is a priority.  I know that he and his wife will have a very nice holiday season.  He will be treated for the next 3 weeks.  Then he will  have off.  I will plan to see him back in December.    [name], MD 11/8/20238:21 AM

## 2021-11-18 NOTE — Patient Instructions (Signed)
Bortezomib Injection What is this medication? BORTEZOMIB (bor TEZ oh mib) treats lymphoma. It may also be used to treat multiple myeloma, a type of bone marrow cancer. It works by blocking a protein that causes cancer cells to grow and multiply. This helps to slow or stop the spread of cancer cells. This medicine may be used for other purposes; ask your health care provider or pharmacist if you have questions. COMMON BRAND NAME(S): Velcade What should I tell my care team before I take this medication? They need to know if you have any of these conditions: Dehydration Diabetes Heart disease Liver disease Tingling of the fingers or toes or other nerve disorder An unusual or allergic reaction to bortezomib, other medications, foods, dyes, or preservatives If you or your partner are pregnant or trying to get pregnant Breastfeeding How should I use this medication? This medication is injected into a vein or under the skin. It is given by your care team in a hospital or clinic setting. Talk to your care team about the use of this medication in children. Special care may be needed. Overdosage: If you think you have taken too much of this medicine contact a poison control center or emergency room at once. NOTE: This medicine is only for you. Do not share this medicine with others. What if I miss a dose? Keep appointments for follow-up doses. It is important not to miss your dose. Call your care team if you are unable to keep an appointment. What may interact with this medication? Ketoconazole Rifampin This list may not describe all possible interactions. Give your health care provider a list of all the medicines, herbs, non-prescription drugs, or dietary supplements you use. Also tell them if you smoke, drink alcohol, or use illegal drugs. Some items may interact with your medicine. What should I watch for while using this medication? Your condition will be monitored carefully while you are  receiving this medication. You may need blood work while taking this medication. This medication may affect your coordination, reaction time, or judgment. Do not drive or operate machinery until you know how this medication affects you. Sit up or stand slowly to reduce the risk of dizzy or fainting spells. Drinking alcohol with this medication can increase the risk of these side effects. This medication may increase your risk of getting an infection. Call your care team for advice if you get a fever, chills, sore throat, or other symptoms of a cold or flu. Do not treat yourself. Try to avoid being around people who are sick. Check with your care team if you have severe diarrhea, nausea, and vomiting, or if you sweat a lot. The loss of too much body fluid may make it dangerous for you to take this medication. Talk to your care team if you may be pregnant. Serious birth defects can occur if you take this medication during pregnancy and for 7 months after the last dose. You will need a negative pregnancy test before starting this medication. Contraception is recommended while taking this medication and for 7 months after the last dose. Your care team can help you find the option that works for you. If your partner can get pregnant, use a condom during sex while taking this medication and for 4 months after the last dose. Do not breastfeed while taking this medication and for 2 months after the last dose. This medication may cause infertility. Talk to your care team if you are concerned about your fertility. What side effects   may I notice from receiving this medication? Side effects that you should report to your care team as soon as possible: Allergic reactions--skin rash, itching, hives, swelling of the face, lips, tongue, or throat Bleeding--bloody or black, tar-like stools, vomiting blood or brown material that looks like coffee grounds, red or dark brown urine, small red or purple spots on skin, unusual  bruising or bleeding Bleeding in the brain--severe headache, stiff neck, confusion, dizziness, change in vision, numbness or weakness of the face, arm, or leg, trouble speaking, trouble walking, vomiting Bowel blockage--stomach cramping, unable to have a bowel movement or pass gas, loss of appetite, vomiting Heart failure--shortness of breath, swelling of the ankles, feet, or hands, sudden weight gain, unusual weakness or fatigue Infection--fever, chills, cough, sore throat, wounds that don't heal, pain or trouble when passing urine, general feeling of discomfort or being unwell Liver injury--right upper belly pain, loss of appetite, nausea, light-colored stool, dark yellow or brown urine, yellowing skin or eyes, unusual weakness or fatigue Low blood pressure--dizziness, feeling faint or lightheaded, blurry vision Lung injury--shortness of breath or trouble breathing, cough, spitting up blood, chest pain, fever Pain, tingling, or numbness in the hands or feet Severe or prolonged diarrhea Stomach pain, bloody diarrhea, pale skin, unusual weakness or fatigue, decrease in the amount of urine, which may be signs of hemolytic uremic syndrome Sudden and severe headache, confusion, change in vision, seizures, which may be signs of posterior reversible encephalopathy syndrome (PRES) TTP--purple spots on the skin or inside the mouth, pale skin, yellowing skin or eyes, unusual weakness or fatigue, fever, fast or irregular heartbeat, confusion, change in vision, trouble speaking, trouble walking Tumor lysis syndrome (TLS)--nausea, vomiting, diarrhea, decrease in the amount of urine, dark urine, unusual weakness or fatigue, confusion, muscle pain or cramps, fast or irregular heartbeat, joint pain Side effects that usually do not require medical attention (report to your care team if they continue or are bothersome): Constipation Diarrhea Fatigue Loss of appetite Nausea This list may not describe all possible  side effects. Call your doctor for medical advice about side effects. You may report side effects to FDA at 1-800-FDA-1088. Where should I keep my medication? This medication is given in a hospital or clinic. It will not be stored at home. NOTE: This sheet is a summary. It may not cover all possible information. If you have questions about this medicine, talk to your doctor, pharmacist, or health care provider.  2023 Elsevier/Gold Standard (2021-05-27 00:00:00)  

## 2021-11-18 NOTE — Addendum Note (Signed)
Addended by: Burney Gauze R on: 11/18/2021 09:04 AM   Modules accepted: Orders

## 2021-11-19 ENCOUNTER — Other Ambulatory Visit: Payer: Self-pay

## 2021-11-19 LAB — KAPPA/LAMBDA LIGHT CHAINS
Kappa free light chain: 127.5 mg/L — ABNORMAL HIGH (ref 3.3–19.4)
Kappa, lambda light chain ratio: 2.91 — ABNORMAL HIGH (ref 0.26–1.65)
Lambda free light chains: 43.8 mg/L — ABNORMAL HIGH (ref 5.7–26.3)

## 2021-11-20 ENCOUNTER — Encounter: Payer: Self-pay | Admitting: *Deleted

## 2021-11-20 ENCOUNTER — Other Ambulatory Visit: Payer: Self-pay

## 2021-11-20 LAB — IGG, IGA, IGM
IgA: 590 mg/dL — ABNORMAL HIGH (ref 61–437)
IgG (Immunoglobin G), Serum: 1008 mg/dL (ref 603–1613)
IgM (Immunoglobulin M), Srm: 62 mg/dL (ref 15–143)

## 2021-11-23 LAB — PROTEIN ELECTROPHORESIS, SERUM, WITH REFLEX
A/G Ratio: 1.1 (ref 0.7–1.7)
Albumin ELP: 3.2 g/dL (ref 2.9–4.4)
Alpha-1-Globulin: 0.2 g/dL (ref 0.0–0.4)
Alpha-2-Globulin: 0.6 g/dL (ref 0.4–1.0)
Beta Globulin: 1 g/dL (ref 0.7–1.3)
Gamma Globulin: 1.1 g/dL (ref 0.4–1.8)
Globulin, Total: 3 g/dL (ref 2.2–3.9)
Total Protein ELP: 6.2 g/dL (ref 6.0–8.5)

## 2021-11-25 ENCOUNTER — Other Ambulatory Visit: Payer: Self-pay

## 2021-11-25 ENCOUNTER — Ambulatory Visit (INDEPENDENT_AMBULATORY_CARE_PROVIDER_SITE_OTHER): Payer: Medicare Other | Admitting: Family Medicine

## 2021-11-25 ENCOUNTER — Encounter: Payer: Self-pay | Admitting: Family Medicine

## 2021-11-25 VITALS — BP 168/70 | HR 52 | Temp 97.7°F | Wt 177.0 lb

## 2021-11-25 DIAGNOSIS — D472 Monoclonal gammopathy: Secondary | ICD-10-CM

## 2021-11-25 DIAGNOSIS — I10 Essential (primary) hypertension: Secondary | ICD-10-CM

## 2021-11-25 DIAGNOSIS — M109 Gout, unspecified: Secondary | ICD-10-CM

## 2021-11-25 MED ORDER — PREDNISONE 20 MG PO TABS: ORAL_TABLET | ORAL | 0 refills | Status: DC

## 2021-11-25 MED ORDER — AMLODIPINE BESYLATE 2.5 MG PO TABS: 2.5000 mg | ORAL_TABLET | Freq: Every day | ORAL | 3 refills | Status: DC

## 2021-11-25 MED ORDER — LENALIDOMIDE 10 MG PO CAPS: 10.0000 mg | ORAL_CAPSULE | Freq: Every day | ORAL | 0 refills | Status: DC

## 2021-11-25 NOTE — Patient Instructions (Signed)
Start back the Amlodipine 2.5 mg once daily  Monitor blood pressure at home several times per week  Let me know if systolic (top number) is consistently > 140.

## 2021-11-25 NOTE — Progress Notes (Signed)
Established Patient Office Visit  Subjective   Patient ID: James Marks, male    DOB: 1940/11/30  Age: 81 y.o. MRN: [id number]  Chief Complaint  Patient presents with   Annual Exam    M    HPI   James Marks was initially scheduled for "complete physical ".  However, he has Medicare primary.  We focused on primary medical issues today.  He does have history of hypertension, peripheral neuropathy, chronic kidney disease, multiple myeloma, hyperlipidemia, gout.  Followed closely by oncology.  He had some fatigue which she attributes to recent treatments regarding his myeloma.  He is getting regular lab work through them.  Has been taking amlodipine 5 mg daily and losartan 50 mg daily and had some hypotension episodes with oncology follow-up and his amlodipine was dropped.  Since that time he had multiple readings at home 315Q up to 008 systolic.  No significant headaches.  No chest pains.  No peripheral edema.  He has history of gout.  Periodic flareups.  Has responded well to short courses of prednisone in the past.  Also has colchicine.  Getting ready to go to the beach.  Requesting prescription for prednisone to have on hand in the event of acute flare.  No thiazide use.  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 He started smoking about 69 years ago. He has a 11.00 pack-year smoking history. He quit smokeless tobacco use about 9 years ago. He reports that he does not drink alcohol and does not use drugs. family history includes Cancer in his mother; Diabetes in his mother and another family member; Diabetes Mellitus II in his brother;  Hypertension in his father; Macular degeneration in his brother. No Known Allergies  Review of Systems  Constitutional:  Positive for malaise/fatigue. Negative for chills and fever.  Respiratory:  Negative for cough.   Cardiovascular:  Negative for chest pain.  Gastrointestinal:  Negative for abdominal pain.      Objective:     BP (!) 168/70 (BP Location: Left Arm, Cuff Size: Normal)   Pulse (!) 52   Temp 97.7 F (36.5 C) (Oral)   Wt 177 lb (80.3 kg)   SpO2 99%   BMI 22.42 kg/m  BP Readings from Last 3 Encounters:  11/25/21 (!) 168/70  11/18/21 (!) 147/53  11/11/21 (!) 148/70   Wt Readings from Last 3 Encounters:  11/25/21 177 lb (80.3 kg)  11/18/21 175 lb (79.4 kg)  10/21/21 178 lb (80.7 kg)      Physical Exam Vitals reviewed.  Constitutional:      Appearance: Normal appearance. He is well-developed.  Eyes:     Pupils: Pupils are equal, round, and reactive to light.  Neck:     Thyroid: No thyromegaly.  Cardiovascular:     Rate and Rhythm: Normal rate and regular rhythm.  Pulmonary:     Effort: Pulmonary effort is normal. No respiratory distress.     Breath sounds: Normal breath sounds. No wheezing or rales.  Musculoskeletal:     Cervical back:  Neck supple.     Right lower leg: No edema.     Left lower leg: No edema.  Neurological:     Mental Status: He is alert and oriented to person, place, and time.      No results found for any visits on 11/25/21.  Last CBC Lab Results  Component Value Date   WBC 4.6 11/18/2021   HGB 10.3 (L) 11/18/2021   HCT 32.7 (L) 11/18/2021   MCV 92.6 11/18/2021   MCH 29.2 11/18/2021   RDW 16.2 (H) 11/18/2021   PLT 125 (L) 34/28/7681   Last metabolic panel Lab Results  Component Value Date   GLUCOSE 111 (H) 11/18/2021   NA 142 11/18/2021   K 4.7 11/18/2021   CL 107 11/18/2021   CO2 30 11/18/2021   BUN 22 11/18/2021   CREATININE 1.46 (H) 11/18/2021   GFRNONAA 48 (L) 11/18/2021   CALCIUM 8.9 11/18/2021   PROT  6.7 11/18/2021   ALBUMIN 3.8 11/18/2021   LABGLOB 3.0 11/18/2021   AGRATIO 1.1 11/18/2021   BILITOT 0.4 11/18/2021   ALKPHOS 89 11/18/2021   AST 11 (L) 11/18/2021   ALT 11 11/18/2021   ANIONGAP 5 11/18/2021   Last thyroid functions Lab Results  Component Value Date   TSH 0.763 03/29/2014      The ASCVD Risk score (Arnett DK, et al., 2019) failed to calculate for the following reasons:   The 2019 ASCVD risk score is only valid for ages 59 to 71    Assessment & Plan:   #1 hypertension poorly controlled.  He had some recent hypotension as above and amlodipine was discontinued.  Since that time he had multiple readings 160s including here today 157 systolic and up to 262 systolic by home readings.  Add back amlodipine 2.5 mg daily.  May need to titrate up further.  Continue losartan 50 mg daily.  Touch base next few weeks if blood pressures not consistently less than 035 systolic  #2 history of gout.  No current flare.  He has had a couple flares this past year.  Requesting refill of prednisone to have on hand as needed.  He also has colchicine to take as needed.  Carolann Littler, MD

## 2021-12-01 ENCOUNTER — Other Ambulatory Visit: Payer: Self-pay

## 2021-12-01 DIAGNOSIS — N1832 Chronic kidney disease, stage 3b: Secondary | ICD-10-CM

## 2021-12-01 DIAGNOSIS — D472 Monoclonal gammopathy: Secondary | ICD-10-CM

## 2021-12-02 ENCOUNTER — Inpatient Hospital Stay: Payer: Medicare Other

## 2021-12-02 ENCOUNTER — Other Ambulatory Visit: Payer: Self-pay | Admitting: *Deleted

## 2021-12-02 VITALS — BP 133/56 | HR 55 | Temp 98.0°F | Resp 16

## 2021-12-02 DIAGNOSIS — D509 Iron deficiency anemia, unspecified: Secondary | ICD-10-CM

## 2021-12-02 DIAGNOSIS — D472 Monoclonal gammopathy: Secondary | ICD-10-CM

## 2021-12-02 DIAGNOSIS — N1832 Chronic kidney disease, stage 3b: Secondary | ICD-10-CM

## 2021-12-02 DIAGNOSIS — C9000 Multiple myeloma not having achieved remission: Secondary | ICD-10-CM | POA: Diagnosis not present

## 2021-12-02 LAB — CBC WITH DIFFERENTIAL (CANCER CENTER ONLY)
Abs Immature Granulocytes: 0.01 10*3/uL (ref 0.00–0.07)
Basophils Absolute: 0.1 10*3/uL (ref 0.0–0.1)
Basophils Relative: 3 %
Eosinophils Absolute: 0.1 10*3/uL (ref 0.0–0.5)
Eosinophils Relative: 3 %
HCT: 36.1 % — ABNORMAL LOW (ref 39.0–52.0)
Hemoglobin: 11.2 g/dL — ABNORMAL LOW (ref 13.0–17.0)
Immature Granulocytes: 0 %
Lymphocytes Relative: 27 %
Lymphs Abs: 1.1 10*3/uL (ref 0.7–4.0)
MCH: 28.6 pg (ref 26.0–34.0)
MCHC: 31 g/dL (ref 30.0–36.0)
MCV: 92.3 fL (ref 80.0–100.0)
Monocytes Absolute: 0.5 10*3/uL (ref 0.1–1.0)
Monocytes Relative: 11 %
Neutro Abs: 2.3 10*3/uL (ref 1.7–7.7)
Neutrophils Relative %: 56 %
Platelet Count: 168 10*3/uL (ref 150–400)
RBC: 3.91 MIL/uL — ABNORMAL LOW (ref 4.22–5.81)
RDW: 16.2 % — ABNORMAL HIGH (ref 11.5–15.5)
WBC Count: 4.2 10*3/uL (ref 4.0–10.5)
nRBC: 0 % (ref 0.0–0.2)

## 2021-12-02 LAB — COMPREHENSIVE METABOLIC PANEL
ALT: 12 U/L (ref 0–44)
AST: 12 U/L — ABNORMAL LOW (ref 15–41)
Albumin: 4 g/dL (ref 3.5–5.0)
Alkaline Phosphatase: 95 U/L (ref 38–126)
Anion gap: 7 (ref 5–15)
BUN: 20 mg/dL (ref 8–23)
CO2: 29 mmol/L (ref 22–32)
Calcium: 9.2 mg/dL (ref 8.9–10.3)
Chloride: 106 mmol/L (ref 98–111)
Creatinine, Ser: 1.63 mg/dL — ABNORMAL HIGH (ref 0.61–1.24)
GFR, Estimated: 42 mL/min — ABNORMAL LOW (ref >60)
Glucose, Bld: 143 mg/dL — ABNORMAL HIGH (ref 70–99)
Potassium: 4.4 mmol/L (ref 3.5–5.1)
Sodium: 142 mmol/L (ref 135–145)
Total Bilirubin: 0.4 mg/dL (ref 0.3–1.2)
Total Protein: 7 g/dL (ref 6.5–8.1)

## 2021-12-02 LAB — RETICULOCYTES
Immature Retic Fract: 5.7 % (ref 2.3–15.9)
RBC.: 3.87 MIL/uL — ABNORMAL LOW (ref 4.22–5.81)
Retic Count, Absolute: 38.3 10*3/uL (ref 19.0–186.0)
Retic Ct Pct: 1 % (ref 0.4–3.1)

## 2021-12-02 MED ORDER — BORTEZOMIB CHEMO SQ INJECTION 3.5 MG (2.5MG/ML)
1.3000 mg/m2 | Freq: Once | INTRAMUSCULAR | Status: AC
  Administered 2021-12-02: 2.75 mg via SUBCUTANEOUS
  Filled 2021-12-02: qty 1.1

## 2021-12-02 MED ORDER — PROCHLORPERAZINE MALEATE 10 MG PO TABS: 10.0000 mg | ORAL_TABLET | Freq: Once | ORAL | Status: DC

## 2021-12-02 NOTE — Patient Instructions (Signed)
[facility] [place]  Discharge Instructions: Thank you for choosing [facility] to provide your oncology and hematology care.   If you have a lab appointment with the [facility], please go directly to the [facility] and check in at the registration area.  Wear comfortable clothing and clothing appropriate for easy access to any Portacath or PICC line.   We strive to give you quality time with your provider. You may need to reschedule your appointment if you arrive late (15 or more minutes).  Arriving late affects you and other patients whose appointments are after yours.  Also, if you miss three or more appointments without notifying the office, you may be dismissed from the clinic at the provider's discretion.      For prescription refill requests, have your pharmacy contact our office and allow 72 hours for refills to be completed.    Today you received the following chemotherapy and/or immunotherapy agents Velcade      To help prevent nausea and vomiting after your treatment, we encourage you to take your nausea medication as directed.  BELOW ARE SYMPTOMS THAT SHOULD BE REPORTED IMMEDIATELY: *FEVER GREATER THAN 100.4 F (38 C) OR HIGHER *CHILLS OR SWEATING *NAUSEA AND VOMITING THAT IS NOT CONTROLLED WITH YOUR NAUSEA MEDICATION *UNUSUAL SHORTNESS OF BREATH *UNUSUAL BRUISING OR BLEEDING *URINARY PROBLEMS (pain or burning when urinating, or frequent urination) *BOWEL PROBLEMS (unusual diarrhea, constipation, pain near the anus) TENDERNESS IN MOUTH AND THROAT WITH OR WITHOUT PRESENCE OF ULCERS (sore throat, sores in mouth, or a toothache) UNUSUAL RASH, SWELLING OR PAIN  UNUSUAL VAGINAL DISCHARGE OR ITCHING   Items with * indicate a potential emergency and should be followed up as soon as possible or go to the Emergency Department if any problems should occur.  Please show the CHEMOTHERAPY ALERT CARD or IMMUNOTHERAPY ALERT CARD at check-in to the  Emergency Department and triage nurse. Should you have questions after your visit or need to cancel or reschedule your appointment, please contact [facility]  [phone] and follow the prompts.  Office hours are 8:00 a.m. to 4:30 p.m. Monday - Friday. Please note that voicemails left after 4:00 p.m. may not be returned until the following business day.  We are closed weekends and major holidays. You have access to a nurse at all times for urgent questions. Please call the main number to the clinic [phone] and follow the prompts.  For any non-urgent questions, you may also contact your provider using MyChart. We now offer e-Visits for anyone 16 and older to request care online for non-urgent symptoms. For details visit [email or web address].   Also download the MyChart app! Go to the app store, search "MyChart", open the app, select [facility], and log in with your MyChart username and password.  Masks are optional in the cancer centers. If you would like for your care team to wear a mask while they are taking care of you, please let them know. You may have one support person who is at least 82 years old accompany you for your appointments.

## 2021-12-02 NOTE — Progress Notes (Signed)
Ok to treat with creatinine of 1.63. dph

## 2021-12-07 ENCOUNTER — Other Ambulatory Visit: Payer: Self-pay

## 2021-12-08 ENCOUNTER — Other Ambulatory Visit: Payer: Self-pay

## 2021-12-08 DIAGNOSIS — D472 Monoclonal gammopathy: Secondary | ICD-10-CM

## 2021-12-09 ENCOUNTER — Inpatient Hospital Stay: Payer: Medicare Other

## 2021-12-09 VITALS — BP 132/68 | HR 60 | Temp 97.8°F | Resp 18

## 2021-12-09 DIAGNOSIS — C9000 Multiple myeloma not having achieved remission: Secondary | ICD-10-CM | POA: Diagnosis not present

## 2021-12-09 DIAGNOSIS — D472 Monoclonal gammopathy: Secondary | ICD-10-CM

## 2021-12-09 LAB — CMP (CANCER CENTER ONLY)
ALT: 8 U/L (ref 0–44)
AST: 10 U/L — ABNORMAL LOW (ref 15–41)
Albumin: 3.9 g/dL (ref 3.5–5.0)
Alkaline Phosphatase: 104 U/L (ref 38–126)
Anion gap: 6 (ref 5–15)
BUN: 23 mg/dL (ref 8–23)
CO2: 30 mmol/L (ref 22–32)
Calcium: 8.9 mg/dL (ref 8.9–10.3)
Chloride: 106 mmol/L (ref 98–111)
Creatinine: 1.72 mg/dL — ABNORMAL HIGH (ref 0.61–1.24)
GFR, Estimated: 39 mL/min — ABNORMAL LOW (ref >60)
Glucose, Bld: 118 mg/dL — ABNORMAL HIGH (ref 70–99)
Potassium: 4.1 mmol/L (ref 3.5–5.1)
Sodium: 142 mmol/L (ref 135–145)
Total Bilirubin: 0.4 mg/dL (ref 0.3–1.2)
Total Protein: 6.5 g/dL (ref 6.5–8.1)

## 2021-12-09 LAB — CBC WITH DIFFERENTIAL (CANCER CENTER ONLY)
Abs Immature Granulocytes: 0.02 10*3/uL (ref 0.00–0.07)
Basophils Absolute: 0.1 10*3/uL (ref 0.0–0.1)
Basophils Relative: 3 %
Eosinophils Absolute: 0.2 10*3/uL (ref 0.0–0.5)
Eosinophils Relative: 5 %
HCT: 35.4 % — ABNORMAL LOW (ref 39.0–52.0)
Hemoglobin: 11.1 g/dL — ABNORMAL LOW (ref 13.0–17.0)
Immature Granulocytes: 0 %
Lymphocytes Relative: 32 %
Lymphs Abs: 1.5 10*3/uL (ref 0.7–4.0)
MCH: 29.3 pg (ref 26.0–34.0)
MCHC: 31.4 g/dL (ref 30.0–36.0)
MCV: 93.4 fL (ref 80.0–100.0)
Monocytes Absolute: 0.5 10*3/uL (ref 0.1–1.0)
Monocytes Relative: 10 %
Neutro Abs: 2.4 10*3/uL (ref 1.7–7.7)
Neutrophils Relative %: 50 %
Platelet Count: 143 10*3/uL — ABNORMAL LOW (ref 150–400)
RBC: 3.79 MIL/uL — ABNORMAL LOW (ref 4.22–5.81)
RDW: 16.1 % — ABNORMAL HIGH (ref 11.5–15.5)
WBC Count: 4.7 10*3/uL (ref 4.0–10.5)
nRBC: 0 % (ref 0.0–0.2)

## 2021-12-09 MED ORDER — BORTEZOMIB CHEMO SQ INJECTION 3.5 MG (2.5MG/ML)
1.3000 mg/m2 | Freq: Once | INTRAMUSCULAR | Status: AC
  Administered 2021-12-09: 2.75 mg via SUBCUTANEOUS
  Filled 2021-12-09: qty 1.1

## 2021-12-09 MED ORDER — PROCHLORPERAZINE MALEATE 10 MG PO TABS: 10.0000 mg | ORAL_TABLET | Freq: Once | ORAL | Status: DC

## 2021-12-09 NOTE — Patient Instructions (Signed)
[facility] [place]  Discharge Instructions: Thank you for choosing [facility] to provide your oncology and hematology care.   If you have a lab appointment with the [facility], please go directly to the [facility] and check in at the registration area.  Wear comfortable clothing and clothing appropriate for easy access to any Portacath or PICC line.   We strive to give you quality time with your provider. You may need to reschedule your appointment if you arrive late (15 or more minutes).  Arriving late affects you and other patients whose appointments are after yours.  Also, if you miss three or more appointments without notifying the office, you may be dismissed from the clinic at the provider's discretion.      For prescription refill requests, have your pharmacy contact our office and allow 72 hours for refills to be completed.    Today you received the following chemotherapy and/or immunotherapy agents:  Velcade      To help prevent nausea and vomiting after your treatment, we encourage you to take your nausea medication as directed.  BELOW ARE SYMPTOMS THAT SHOULD BE REPORTED IMMEDIATELY: *FEVER GREATER THAN 100.4 F (38 C) OR HIGHER *CHILLS OR SWEATING *NAUSEA AND VOMITING THAT IS NOT CONTROLLED WITH YOUR NAUSEA MEDICATION *UNUSUAL SHORTNESS OF BREATH *UNUSUAL BRUISING OR BLEEDING *URINARY PROBLEMS (pain or burning when urinating, or frequent urination) *BOWEL PROBLEMS (unusual diarrhea, constipation, pain near the anus) TENDERNESS IN MOUTH AND THROAT WITH OR WITHOUT PRESENCE OF ULCERS (sore throat, sores in mouth, or a toothache) UNUSUAL RASH, SWELLING OR PAIN  UNUSUAL VAGINAL DISCHARGE OR ITCHING   Items with * indicate a potential emergency and should be followed up as soon as possible or go to the Emergency Department if any problems should occur.  Please show the CHEMOTHERAPY ALERT CARD or IMMUNOTHERAPY ALERT CARD at check-in to the  Emergency Department and triage nurse. Should you have questions after your visit or need to cancel or reschedule your appointment, please contact [facility]  [phone] and follow the prompts.  Office hours are 8:00 a.m. to 4:30 p.m. Monday - Friday. Please note that voicemails left after 4:00 p.m. may not be returned until the following business day.  We are closed weekends and major holidays. You have access to a nurse at all times for urgent questions. Please call the main number to the clinic [phone] and follow the prompts.  For any non-urgent questions, you may also contact your provider using MyChart. We now offer e-Visits for anyone 7 and older to request care online for non-urgent symptoms. For details visit [email or web address].   Also download the MyChart app! Go to the app store, search "MyChart", open the app, select [facility], and log in with your MyChart username and password.  Masks are optional in the cancer centers. If you would like for your care team to wear a mask while they are taking care of you, please let them know. You may have one support person who is at least 82 years old accompany you for your appointments.

## 2021-12-09 NOTE — Progress Notes (Signed)
Reviewed pt labs with Dr. Ennever and pt ok to treat with creatinine 1.72 

## 2021-12-16 ENCOUNTER — Inpatient Hospital Stay: Payer: Medicare Other

## 2021-12-16 ENCOUNTER — Encounter: Payer: Self-pay | Admitting: Hematology & Oncology

## 2021-12-16 ENCOUNTER — Inpatient Hospital Stay: Payer: Medicare Other | Attending: Hematology & Oncology | Admitting: Hematology & Oncology

## 2021-12-16 VITALS — BP 138/56 | HR 53 | Temp 98.1°F | Resp 17 | Wt 173.0 lb

## 2021-12-16 DIAGNOSIS — Z7982 Long term (current) use of aspirin: Secondary | ICD-10-CM | POA: Insufficient documentation

## 2021-12-16 DIAGNOSIS — D472 Monoclonal gammopathy: Secondary | ICD-10-CM

## 2021-12-16 DIAGNOSIS — R5383 Other fatigue: Secondary | ICD-10-CM | POA: Insufficient documentation

## 2021-12-16 DIAGNOSIS — C9000 Multiple myeloma not having achieved remission: Secondary | ICD-10-CM | POA: Insufficient documentation

## 2021-12-16 DIAGNOSIS — Z7961 Long term (current) use of immunomodulator: Secondary | ICD-10-CM | POA: Insufficient documentation

## 2021-12-16 DIAGNOSIS — M549 Dorsalgia, unspecified: Secondary | ICD-10-CM | POA: Insufficient documentation

## 2021-12-16 DIAGNOSIS — D509 Iron deficiency anemia, unspecified: Secondary | ICD-10-CM

## 2021-12-16 LAB — CBC WITH DIFFERENTIAL (CANCER CENTER ONLY)
Abs Immature Granulocytes: 0.01 10*3/uL (ref 0.00–0.07)
Basophils Absolute: 0.1 10*3/uL (ref 0.0–0.1)
Basophils Relative: 2 %
Eosinophils Absolute: 0.2 10*3/uL (ref 0.0–0.5)
Eosinophils Relative: 5 %
HCT: 34.9 % — ABNORMAL LOW (ref 39.0–52.0)
Hemoglobin: 10.8 g/dL — ABNORMAL LOW (ref 13.0–17.0)
Immature Granulocytes: 0 %
Lymphocytes Relative: 24 %
Lymphs Abs: 1 10*3/uL (ref 0.7–4.0)
MCH: 28.7 pg (ref 26.0–34.0)
MCHC: 30.9 g/dL (ref 30.0–36.0)
MCV: 92.8 fL (ref 80.0–100.0)
Monocytes Absolute: 0.7 10*3/uL (ref 0.1–1.0)
Monocytes Relative: 19 %
Neutro Abs: 2 10*3/uL (ref 1.7–7.7)
Neutrophils Relative %: 50 %
Platelet Count: 92 10*3/uL — ABNORMAL LOW (ref 150–400)
RBC: 3.76 MIL/uL — ABNORMAL LOW (ref 4.22–5.81)
RDW: 15.7 % — ABNORMAL HIGH (ref 11.5–15.5)
WBC Count: 3.9 10*3/uL — ABNORMAL LOW (ref 4.0–10.5)
nRBC: 0 % (ref 0.0–0.2)

## 2021-12-16 LAB — COMPREHENSIVE METABOLIC PANEL
ALT: 9 U/L (ref 0–44)
AST: 13 U/L — ABNORMAL LOW (ref 15–41)
Albumin: 4 g/dL (ref 3.5–5.0)
Alkaline Phosphatase: 84 U/L (ref 38–126)
Anion gap: 9 (ref 5–15)
BUN: 22 mg/dL (ref 8–23)
CO2: 29 mmol/L (ref 22–32)
Calcium: 8.8 mg/dL — ABNORMAL LOW (ref 8.9–10.3)
Chloride: 106 mmol/L (ref 98–111)
Creatinine, Ser: 1.6 mg/dL — ABNORMAL HIGH (ref 0.61–1.24)
GFR, Estimated: 43 mL/min — ABNORMAL LOW (ref >60)
Glucose, Bld: 119 mg/dL — ABNORMAL HIGH (ref 70–99)
Potassium: 3.9 mmol/L (ref 3.5–5.1)
Sodium: 144 mmol/L (ref 135–145)
Total Bilirubin: 0.5 mg/dL (ref 0.3–1.2)
Total Protein: 7 g/dL (ref 6.5–8.1)

## 2021-12-16 LAB — FERRITIN: Ferritin: 79 ng/mL (ref 24–336)

## 2021-12-16 LAB — LACTATE DEHYDROGENASE: LDH: 102 U/L (ref 98–192)

## 2021-12-16 MED ORDER — PROCHLORPERAZINE MALEATE 10 MG PO TABS: 10.0000 mg | ORAL_TABLET | Freq: Once | ORAL | Status: DC

## 2021-12-16 MED ORDER — BORTEZOMIB CHEMO SQ INJECTION 3.5 MG (2.5MG/ML)
1.3000 mg/m2 | Freq: Once | INTRAMUSCULAR | Status: AC
  Administered 2021-12-16: 2.75 mg via SUBCUTANEOUS
  Filled 2021-12-16: qty 1.1

## 2021-12-16 NOTE — Progress Notes (Signed)
Hematology and Oncology Follow Up Visit  James Marks [id number] 21-Feb-1940 81 y.o. 12/16/2021   Principle Diagnosis:  IgA kappa  myeloma  Current Therapy:   Velcade/Revlimid/Decadron -- s/p cycle #5 -- start on 07/27/2021     Interim History:  [name] is back for follow-up.  So far, he is doing pretty well.  He has tolerated treatment pretty well.  I do believe that it is working.  He is on Velcade and Revlimid.  I would suspect that the Revlimid might be causing more problems than the Velcade.  His wife says that he has little bit of balance issues.  I do not know if this might be a little bit of neuropathy.  When we last checked his myeloma studies, there is no monoclonal spike in his blood.  His IgA level was 590 mg/dL.  The Kappa light chain was 12.8 mg/dL.  He has had no problems with bowels or bladder.  He has had no issues with cough or shortness of breath.  He has had no bleeding.  He is doing a 24-hour urine for [place].  He and his wife had a good Thanksgiving.  It sounds like he will have a nice quiet Christmas.  Currently, I would have to say that his performance status is ECOG 1.  Medications:  Current Outpatient Medications:    acyclovir (ZOVIRAX) 400 MG tablet, Take 1 tablet (400 mg total) by mouth 2 (two) times daily., Disp: 60 tablet, Rfl: 3   amLODipine (NORVASC) 2.5 MG tablet, Take 1 tablet (2.5 mg total) by mouth daily., Disp: 90 tablet, Rfl: 3   amoxicillin (AMOXIL) 500 MG capsule, Take four tablets (2000 mg) total one hour prior to dental procedure., Disp: 4 capsule, Rfl: 0   aspirin 325 MG tablet, Take 325 mg by mouth daily., Disp: , Rfl:    gabapentin (NEURONTIN) 300 MG capsule, TAKE ONE CAPSULE BY MOUTH DAILY WITH BREAKFAST, 1 CAPSULE WITH DINNER AND 2 CAPSULES AT BEDTIME, Disp: 360 capsule, Rfl: 4   lenalidomide (REVLIMID) 10 MG capsule, Take 1 capsule (10 mg total) by mouth daily. Take for 21 days on, 7 days off. Repeat every 28 days. [employer] Auth #  [id number], Disp: 21 capsule, Rfl: 0   losartan (COZAAR) 50 MG tablet, TAKE 1 TABLET BY MOUTH DAILY, Disp: 90 tablet, Rfl: 0   Multiple Vitamins-Minerals (PRESERVISION AREDS 2 PO), Take by mouth daily at 6 (six) AM., Disp: , Rfl:    predniSONE (DELTASONE) 20 MG tablet, Take two tablets by mouth once daily for 5 days as needed for acute gout flare., Disp: 10 tablet, Rfl: 0   colchicine 0.6 MG tablet, Take 1 tablet (0.6 mg total) by mouth daily. (Patient not taking: Reported on 11/18/2021), Disp: 30 tablet, Rfl: 0 No current facility-administered medications for this visit.  Facility-Administered Medications Ordered in Other Visits:    0.9 %  sodium chloride infusion, , Intravenous, Continuous, Daegon Deiss, Rudell Cobb, MD, Stopped at 08/05/14 1710   bortezomib SQ (VELCADE) chemo injection (2.74m/mL concentration) 2.75 mg, 1.3 mg/m2 (Treatment Plan Recorded), Subcutaneous, Once, Chena Chohan, PRudell Cobb MD   prochlorperazine (COMPAZINE) tablet 10 mg, 10 mg, Oral, Once, Tavon Corriher, PRudell Cobb MD   prochlorperazine (COMPAZINE) tablet 10 mg, 10 mg, Oral, Once, Vuk Skillern, PRudell Cobb MD  Allergies: No Known Allergies  Past Medical History, Surgical history, Social history, and Family History were reviewed and updated.  Review of Systems: Review of Systems  Constitutional:  Positive for malaise/fatigue.  HENT:  Positive for congestion.  Eyes: Negative.   Respiratory:  Positive for shortness of breath.   Cardiovascular:  Positive for palpitations.  Gastrointestinal: Negative.   Genitourinary: Negative.   Musculoskeletal:  Positive for back pain and myalgias.  Skin: Negative.   Neurological:  Positive for tingling.  Endo/Heme/Allergies: Negative.   Psychiatric/Behavioral: Negative.      Physical Exam:  weight is 173 lb (78.5 kg). His oral temperature is 98.1 F (36.7 C). His blood pressure is 138/56 (abnormal) and his pulse is 53 (abnormal). His respiration is 17 and oxygen saturation is 98%.   Wt Readings from  Last 3 Encounters:  12/16/21 173 lb (78.5 kg)  11/25/21 177 lb (80.3 kg)  11/18/21 175 lb (79.4 kg)     Physical Exam Vitals reviewed.  HENT:     Head: Normocephalic and atraumatic.  Eyes:     Pupils: Pupils are equal, round, and reactive to light.  Cardiovascular:     Rate and Rhythm: Normal rate and regular rhythm.     Heart sounds: Normal heart sounds.  Pulmonary:     Effort: Pulmonary effort is normal.     Breath sounds: Normal breath sounds.  Abdominal:     General: Bowel sounds are normal.     Palpations: Abdomen is soft.  Musculoskeletal:        General: No tenderness or deformity. Normal range of motion.     Cervical back: Normal range of motion.  Lymphadenopathy:     Cervical: No cervical adenopathy.  Skin:    General: Skin is warm and dry.     Findings: No erythema or rash.  Neurological:     Mental Status: He is alert and oriented to person, place, and time.  Psychiatric:        Behavior: Behavior normal.        Thought Content: Thought content normal.        Judgment: Judgment normal.     Lab Results  Component Value Date   WBC 3.9 (L) 12/16/2021   HGB 10.8 (L) 12/16/2021   HCT 34.9 (L) 12/16/2021   MCV 92.8 12/16/2021   PLT 92 (L) 12/16/2021     Chemistry      Component Value Date/Time   NA 144 12/16/2021 1222   NA 143 12/06/2016 1156   NA 139 08/02/2016 1146   K 3.9 12/16/2021 1222   K 4.2 12/06/2016 1156   K 4.7 08/02/2016 1146   CL 106 12/16/2021 1222   CL 107 12/06/2016 1156   CO2 29 12/16/2021 1222   CO2 28 12/06/2016 1156   CO2 24 08/02/2016 1146   BUN 22 12/16/2021 1222   BUN 29 (H) 12/06/2016 1156   BUN 34.3 (H) 08/02/2016 1146   CREATININE 1.60 (H) 12/16/2021 1222   CREATININE 1.72 (H) 12/09/2021 0925   CREATININE 2.1 (H) 12/06/2016 1156   CREATININE 1.7 (H) 08/02/2016 1146      Component Value Date/Time   CALCIUM 8.8 (L) 12/16/2021 1222   CALCIUM 9.1 12/06/2016 1156   CALCIUM 9.5 08/02/2016 1146   ALKPHOS 84 12/16/2021  1222   ALKPHOS 108 (H) 12/06/2016 1156   ALKPHOS 115 08/02/2016 1146   AST 13 (L) 12/16/2021 1222   AST 10 (L) 12/09/2021 0925   AST 14 08/02/2016 1146   ALT 9 12/16/2021 1222   ALT 8 12/09/2021 0925   ALT 18 12/06/2016 1156   ALT 9 08/02/2016 1146   BILITOT 0.5 12/16/2021 1222   BILITOT 0.4 12/09/2021 0925   BILITOT 0.41  08/02/2016 1146      Impression and Plan:  Mr. Geise is 81 year old man with IgA kappa myeloma.  We will have to see how his myeloma studies look.  We will likely have to do a bone marrow biopsy on him sometime in late January.  Again, I do not think were going to cure this.  Hopefully, we can get a good remission in.  We will see what his 24-hour urine has to show.  Of note, he has a very low erythropoietin level (4.2 done on 06/2021) so we may have to think about using ESA to try to help with his anemia.  I will plan to see him back in another 3 weeks or so.    [name], MD 12/6/20231:54 PM

## 2021-12-16 NOTE — Progress Notes (Signed)
Reviewed pt labs with Dr. Marin Olp and pt ok to treat with platelets 92

## 2021-12-16 NOTE — Patient Instructions (Signed)
[facility]  Discharge Instructions: Thank you for choosing [facility] to provide your oncology and hematology care.   If you have a lab appointment with the [facility], please go directly to the [facility] and check in at the registration area.  Wear comfortable clothing and clothing appropriate for easy access to any Portacath or PICC line.   We strive to give you quality time with your provider. You may need to reschedule your appointment if you arrive late (15 or more minutes).  Arriving late affects you and other patients whose appointments are after yours.  Also, if you miss three or more appointments without notifying the office, you may be dismissed from the clinic at the provider's discretion.      For prescription refill requests, have your pharmacy contact our office and allow 72 hours for refills to be completed.    Today you received the following chemotherapy and/or immunotherapy agents Velcade      To help prevent nausea and vomiting after your treatment, we encourage you to take your nausea medication as directed.  BELOW ARE SYMPTOMS THAT SHOULD BE REPORTED IMMEDIATELY: *FEVER GREATER THAN 100.4 F (38 C) OR HIGHER *CHILLS OR SWEATING *NAUSEA AND VOMITING THAT IS NOT CONTROLLED WITH YOUR NAUSEA MEDICATION *UNUSUAL SHORTNESS OF BREATH *UNUSUAL BRUISING OR BLEEDING *URINARY PROBLEMS (pain or burning when urinating, or frequent urination) *BOWEL PROBLEMS (unusual diarrhea, constipation, pain near the anus) TENDERNESS IN MOUTH AND THROAT WITH OR WITHOUT PRESENCE OF ULCERS (sore throat, sores in mouth, or a toothache) UNUSUAL RASH, SWELLING OR PAIN  UNUSUAL VAGINAL DISCHARGE OR ITCHING   Items with * indicate a potential emergency and should be followed up as soon as possible or go to the Emergency Department if any problems should occur.  Please show the CHEMOTHERAPY ALERT CARD or IMMUNOTHERAPY ALERT CARD at check-in to the  Emergency Department and triage nurse. Should you have questions after your visit or need to cancel or reschedule your appointment, please contact [facility]  [phone] and follow the prompts.  Office hours are 8:00 a.m. to 4:30 p.m. Monday - Friday. Please note that voicemails left after 4:00 p.m. may not be returned until the following business day.  We are closed weekends and major holidays. You have access to a nurse at all times for urgent questions. Please call the main number to the clinic [phone] and follow the prompts.  For any non-urgent questions, you may also contact your provider using MyChart. We now offer e-Visits for anyone 72 and older to request care online for non-urgent symptoms. For details visit [email or web address].   Also download the MyChart app! Go to the app store, search "MyChart", open the app, select [facility], and log in with your MyChart username and password.  Masks are optional in the cancer centers. If you would like for your care team to wear a mask while they are taking care of you, please let them know. You may have one support person who is at least 82 years old accompany you for your appointments.

## 2021-12-17 ENCOUNTER — Other Ambulatory Visit: Payer: Self-pay | Admitting: *Deleted

## 2021-12-17 DIAGNOSIS — D472 Monoclonal gammopathy: Secondary | ICD-10-CM

## 2021-12-17 LAB — KAPPA/LAMBDA LIGHT CHAINS
Kappa free light chain: 158.2 mg/L — ABNORMAL HIGH (ref 3.3–19.4)
Kappa, lambda light chain ratio: 3.69 — ABNORMAL HIGH (ref 0.26–1.65)
Lambda free light chains: 42.9 mg/L — ABNORMAL HIGH (ref 5.7–26.3)

## 2021-12-17 LAB — IRON AND IRON BINDING CAPACITY (CC-WL,HP ONLY)
Iron: 77 ug/dL (ref 45–182)
Saturation Ratios: 28 % (ref 17.9–39.5)
TIBC: 272 ug/dL (ref 250–450)
UIBC: 195 ug/dL (ref 117–376)

## 2021-12-17 LAB — ERYTHROPOIETIN: Erythropoietin: 12.6 m[IU]/mL (ref 2.6–18.5)

## 2021-12-17 MED ORDER — LENALIDOMIDE 10 MG PO CAPS: 10.0000 mg | ORAL_CAPSULE | Freq: Every day | ORAL | 0 refills | Status: DC

## 2021-12-18 LAB — PROTEIN ELECTROPHORESIS, SERUM, WITH REFLEX
A/G Ratio: 1.3 (ref 0.7–1.7)
Albumin ELP: 3.6 g/dL (ref 2.9–4.4)
Alpha-1-Globulin: 0.2 g/dL (ref 0.0–0.4)
Alpha-2-Globulin: 0.6 g/dL (ref 0.4–1.0)
Beta Globulin: 0.9 g/dL (ref 0.7–1.3)
Gamma Globulin: 1 g/dL (ref 0.4–1.8)
Globulin, Total: 2.7 g/dL (ref 2.2–3.9)
Total Protein ELP: 6.3 g/dL (ref 6.0–8.5)

## 2021-12-18 LAB — IGG, IGA, IGM
IgA: 576 mg/dL — ABNORMAL HIGH (ref 61–437)
IgG (Immunoglobin G), Serum: 1073 mg/dL (ref 603–1613)
IgM (Immunoglobulin M), Srm: 63 mg/dL (ref 15–143)

## 2021-12-20 ENCOUNTER — Other Ambulatory Visit: Payer: Self-pay

## 2021-12-21 LAB — UPEP/UIFE/LIGHT CHAINS/TP, 24-HR UR
% BETA, Urine: 18.6 %
ALPHA 1 URINE: 8.2 %
Albumin, U: 47.5 %
Alpha 2, Urine: 9.6 %
Free Kappa Lt Chains,Ur: 272.61 mg/L — ABNORMAL HIGH (ref 1.17–86.46)
Free Kappa/Lambda Ratio: 22.2 — ABNORMAL HIGH (ref 1.83–14.26)
Free Lambda Lt Chains,Ur: 12.28 mg/L (ref 0.27–15.21)
GAMMA GLOBULIN URINE: 16 %
Total Protein, Urine-Ur/day: 313 mg/24 hr — ABNORMAL HIGH (ref 30–150)
Total Protein, Urine: 10.8 mg/dL
Total Volume: 2800

## 2021-12-22 ENCOUNTER — Other Ambulatory Visit: Payer: Self-pay

## 2021-12-22 LAB — IMMUNOFIXATION ELECTROPHORESIS
IgA: 577 mg/dL — ABNORMAL HIGH (ref 61–437)
IgG (Immunoglobin G), Serum: 1080 mg/dL (ref 603–1613)
IgM (Immunoglobulin M), Srm: 63 mg/dL (ref 15–143)
Total Protein ELP: 6.4 g/dL (ref 6.0–8.5)

## 2021-12-23 ENCOUNTER — Other Ambulatory Visit: Payer: Self-pay

## 2021-12-24 ENCOUNTER — Other Ambulatory Visit: Payer: Self-pay

## 2021-12-24 ENCOUNTER — Telehealth: Payer: Self-pay | Admitting: *Deleted

## 2021-12-24 NOTE — Telephone Encounter (Signed)
Message received from patient's wife stating that pt had a fall this morning while picking up leaves in the driveway and is bleeding in two places on his head and is requesting a call back.  Call placed back to patients wife Tamela Oddi and she states that pt is alert and oriented at this time and is bleeding at two places on his head.  Doris instructed to take pt to the [facility] ER now per order of Dr. Marin Olp.  Doris states that she will take pt to the ER now.

## 2021-12-25 ENCOUNTER — Telehealth: Payer: Self-pay | Admitting: *Deleted

## 2021-12-25 NOTE — Telephone Encounter (Addendum)
Call placed to check on patient's status after being instructed to go to the ER by Dr. Marin Olp yesterday after a fall.  Pt.'s wife states that pt refused to go to the ER and has had no difficulties over night.  She states that pt has had no headaches, is alert and oriented x 3 and has had no vomiting.  Pt.'s wife is appreciative of call and has no concerns at this time.

## 2021-12-29 ENCOUNTER — Other Ambulatory Visit: Payer: Self-pay

## 2021-12-29 DIAGNOSIS — D472 Monoclonal gammopathy: Secondary | ICD-10-CM

## 2021-12-29 DIAGNOSIS — D509 Iron deficiency anemia, unspecified: Secondary | ICD-10-CM

## 2021-12-30 ENCOUNTER — Inpatient Hospital Stay: Payer: Medicare Other

## 2021-12-30 VITALS — BP 116/55 | HR 57 | Temp 97.9°F | Resp 17

## 2021-12-30 DIAGNOSIS — D472 Monoclonal gammopathy: Secondary | ICD-10-CM

## 2021-12-30 DIAGNOSIS — C9000 Multiple myeloma not having achieved remission: Secondary | ICD-10-CM | POA: Diagnosis not present

## 2021-12-30 LAB — CBC WITH DIFFERENTIAL (CANCER CENTER ONLY)
Abs Immature Granulocytes: 0.02 10*3/uL (ref 0.00–0.07)
Basophils Absolute: 0.1 10*3/uL (ref 0.0–0.1)
Basophils Relative: 3 %
Eosinophils Absolute: 0.1 10*3/uL (ref 0.0–0.5)
Eosinophils Relative: 2 %
HCT: 32.5 % — ABNORMAL LOW (ref 39.0–52.0)
Hemoglobin: 10.3 g/dL — ABNORMAL LOW (ref 13.0–17.0)
Immature Granulocytes: 1 %
Lymphocytes Relative: 30 %
Lymphs Abs: 1.2 10*3/uL (ref 0.7–4.0)
MCH: 29.3 pg (ref 26.0–34.0)
MCHC: 31.7 g/dL (ref 30.0–36.0)
MCV: 92.3 fL (ref 80.0–100.0)
Monocytes Absolute: 0.6 10*3/uL (ref 0.1–1.0)
Monocytes Relative: 15 %
Neutro Abs: 2 10*3/uL (ref 1.7–7.7)
Neutrophils Relative %: 49 %
Platelet Count: 172 10*3/uL (ref 150–400)
RBC: 3.52 MIL/uL — ABNORMAL LOW (ref 4.22–5.81)
RDW: 15.5 % (ref 11.5–15.5)
WBC Count: 3.9 10*3/uL — ABNORMAL LOW (ref 4.0–10.5)
nRBC: 0 % (ref 0.0–0.2)

## 2021-12-30 LAB — CMP (CANCER CENTER ONLY)
ALT: 9 U/L (ref 0–44)
AST: 10 U/L — ABNORMAL LOW (ref 15–41)
Albumin: 4 g/dL (ref 3.5–5.0)
Alkaline Phosphatase: 82 U/L (ref 38–126)
Anion gap: 7 (ref 5–15)
BUN: 24 mg/dL — ABNORMAL HIGH (ref 8–23)
CO2: 29 mmol/L (ref 22–32)
Calcium: 9.1 mg/dL (ref 8.9–10.3)
Chloride: 107 mmol/L (ref 98–111)
Creatinine: 1.58 mg/dL — ABNORMAL HIGH (ref 0.61–1.24)
GFR, Estimated: 43 mL/min — ABNORMAL LOW (ref >60)
Glucose, Bld: 126 mg/dL — ABNORMAL HIGH (ref 70–99)
Potassium: 4.3 mmol/L (ref 3.5–5.1)
Sodium: 143 mmol/L (ref 135–145)
Total Bilirubin: 0.4 mg/dL (ref 0.3–1.2)
Total Protein: 6.7 g/dL (ref 6.5–8.1)

## 2021-12-30 MED ORDER — BORTEZOMIB CHEMO SQ INJECTION 3.5 MG (2.5MG/ML)
1.3000 mg/m2 | Freq: Once | INTRAMUSCULAR | Status: AC
  Administered 2021-12-30: 2.75 mg via SUBCUTANEOUS
  Filled 2021-12-30: qty 1.1

## 2021-12-30 MED ORDER — PROCHLORPERAZINE MALEATE 10 MG PO TABS: 10.0000 mg | ORAL_TABLET | Freq: Once | ORAL | Status: DC

## 2021-12-30 NOTE — Progress Notes (Signed)
Ok to treat with creatinine of 1.58 per Dr Marin Olp. dph

## 2021-12-30 NOTE — Patient Instructions (Signed)
[facility]  Discharge Instructions: Thank you for choosing [facility] to provide your oncology and hematology care.   If you have a lab appointment with the [facility], please go directly to the [facility] and check in at the registration area.  Wear comfortable clothing and clothing appropriate for easy access to any Portacath or PICC line.   We strive to give you quality time with your provider. You may need to reschedule your appointment if you arrive late (15 or more minutes).  Arriving late affects you and other patients whose appointments are after yours.  Also, if you miss three or more appointments without notifying the office, you may be dismissed from the clinic at the provider's discretion.      For prescription refill requests, have your pharmacy contact our office and allow 72 hours for refills to be completed.    Today you received the following chemotherapy and/or immunotherapy agents Velcade      To help prevent nausea and vomiting after your treatment, we encourage you to take your nausea medication as directed.  BELOW ARE SYMPTOMS THAT SHOULD BE REPORTED IMMEDIATELY: *FEVER GREATER THAN 100.4 F (38 C) OR HIGHER *CHILLS OR SWEATING *NAUSEA AND VOMITING THAT IS NOT CONTROLLED WITH YOUR NAUSEA MEDICATION *UNUSUAL SHORTNESS OF BREATH *UNUSUAL BRUISING OR BLEEDING *URINARY PROBLEMS (pain or burning when urinating, or frequent urination) *BOWEL PROBLEMS (unusual diarrhea, constipation, pain near the anus) TENDERNESS IN MOUTH AND THROAT WITH OR WITHOUT PRESENCE OF ULCERS (sore throat, sores in mouth, or a toothache) UNUSUAL RASH, SWELLING OR PAIN  UNUSUAL VAGINAL DISCHARGE OR ITCHING   Items with * indicate a potential emergency and should be followed up as soon as possible or go to the Emergency Department if any problems should occur.  Please show the CHEMOTHERAPY ALERT CARD or IMMUNOTHERAPY ALERT CARD at check-in to the  Emergency Department and triage nurse. Should you have questions after your visit or need to cancel or reschedule your appointment, please contact [facility]  [phone] and follow the prompts.  Office hours are 8:00 a.m. to 4:30 p.m. Monday - Friday. Please note that voicemails left after 4:00 p.m. may not be returned until the following business day.  We are closed weekends and major holidays. You have access to a nurse at all times for urgent questions. Please call the main number to the clinic [phone] and follow the prompts.  For any non-urgent questions, you may also contact your provider using MyChart. We now offer e-Visits for anyone 21 and older to request care online for non-urgent symptoms. For details visit [email or web address].   Also download the MyChart app! Go to the app store, search "MyChart", open the app, select [facility], and log in with your MyChart username and password.  Masks are optional in the cancer centers. If you would like for your care team to wear a mask while they are taking care of you, please let them know. You may have one support person who is at least 82 years old accompany you for your appointments.

## 2021-12-31 ENCOUNTER — Inpatient Hospital Stay: Payer: Medicare Other

## 2021-12-31 ENCOUNTER — Inpatient Hospital Stay: Payer: Medicare Other | Admitting: Hematology & Oncology

## 2022-01-01 ENCOUNTER — Other Ambulatory Visit: Payer: Self-pay | Admitting: Hematology & Oncology

## 2022-01-01 DIAGNOSIS — D472 Monoclonal gammopathy: Secondary | ICD-10-CM

## 2022-01-01 DIAGNOSIS — Z789 Other specified health status: Secondary | ICD-10-CM

## 2022-01-05 ENCOUNTER — Other Ambulatory Visit: Payer: Self-pay | Admitting: *Deleted

## 2022-01-05 DIAGNOSIS — N1832 Chronic kidney disease, stage 3b: Secondary | ICD-10-CM

## 2022-01-05 DIAGNOSIS — D509 Iron deficiency anemia, unspecified: Secondary | ICD-10-CM

## 2022-01-05 DIAGNOSIS — R7871 Abnormal lead level in blood: Secondary | ICD-10-CM

## 2022-01-05 DIAGNOSIS — D472 Monoclonal gammopathy: Secondary | ICD-10-CM

## 2022-01-06 ENCOUNTER — Inpatient Hospital Stay: Payer: Medicare Other

## 2022-01-06 VITALS — BP 125/51 | HR 62 | Temp 97.7°F | Resp 17

## 2022-01-06 DIAGNOSIS — N1832 Chronic kidney disease, stage 3b: Secondary | ICD-10-CM

## 2022-01-06 DIAGNOSIS — D472 Monoclonal gammopathy: Secondary | ICD-10-CM

## 2022-01-06 DIAGNOSIS — D509 Iron deficiency anemia, unspecified: Secondary | ICD-10-CM

## 2022-01-06 DIAGNOSIS — C9000 Multiple myeloma not having achieved remission: Secondary | ICD-10-CM | POA: Diagnosis not present

## 2022-01-06 DIAGNOSIS — R7871 Abnormal lead level in blood: Secondary | ICD-10-CM

## 2022-01-06 LAB — CMP (CANCER CENTER ONLY)
ALT: 10 U/L (ref 0–44)
AST: 12 U/L — ABNORMAL LOW (ref 15–41)
Albumin: 3.8 g/dL (ref 3.5–5.0)
Alkaline Phosphatase: 88 U/L (ref 38–126)
Anion gap: 8 (ref 5–15)
BUN: 28 mg/dL — ABNORMAL HIGH (ref 8–23)
CO2: 27 mmol/L (ref 22–32)
Calcium: 8.9 mg/dL (ref 8.9–10.3)
Chloride: 108 mmol/L (ref 98–111)
Creatinine: 1.66 mg/dL — ABNORMAL HIGH (ref 0.61–1.24)
GFR, Estimated: 41 mL/min — ABNORMAL LOW (ref >60)
Glucose, Bld: 114 mg/dL — ABNORMAL HIGH (ref 70–99)
Potassium: 3.9 mmol/L (ref 3.5–5.1)
Sodium: 143 mmol/L (ref 135–145)
Total Bilirubin: 0.5 mg/dL (ref 0.3–1.2)
Total Protein: 6.6 g/dL (ref 6.5–8.1)

## 2022-01-06 LAB — CBC WITH DIFFERENTIAL (CANCER CENTER ONLY)
Abs Immature Granulocytes: 0.01 10*3/uL (ref 0.00–0.07)
Basophils Absolute: 0.1 10*3/uL (ref 0.0–0.1)
Basophils Relative: 1 %
Eosinophils Absolute: 0.2 10*3/uL (ref 0.0–0.5)
Eosinophils Relative: 4 %
HCT: 32.5 % — ABNORMAL LOW (ref 39.0–52.0)
Hemoglobin: 10.4 g/dL — ABNORMAL LOW (ref 13.0–17.0)
Immature Granulocytes: 0 %
Lymphocytes Relative: 27 %
Lymphs Abs: 1.2 10*3/uL (ref 0.7–4.0)
MCH: 29.4 pg (ref 26.0–34.0)
MCHC: 32 g/dL (ref 30.0–36.0)
MCV: 91.8 fL (ref 80.0–100.0)
Monocytes Absolute: 0.3 10*3/uL (ref 0.1–1.0)
Monocytes Relative: 7 %
Neutro Abs: 2.6 10*3/uL (ref 1.7–7.7)
Neutrophils Relative %: 61 %
Platelet Count: 131 10*3/uL — ABNORMAL LOW (ref 150–400)
RBC: 3.54 MIL/uL — ABNORMAL LOW (ref 4.22–5.81)
RDW: 15.7 % — ABNORMAL HIGH (ref 11.5–15.5)
WBC Count: 4.3 10*3/uL (ref 4.0–10.5)
nRBC: 0 % (ref 0.0–0.2)

## 2022-01-06 MED ORDER — BORTEZOMIB CHEMO SQ INJECTION 3.5 MG (2.5MG/ML)
1.3000 mg/m2 | Freq: Once | INTRAMUSCULAR | Status: AC
  Administered 2022-01-06: 2.75 mg via SUBCUTANEOUS
  Filled 2022-01-06: qty 1.1

## 2022-01-06 MED ORDER — PROCHLORPERAZINE MALEATE 10 MG PO TABS: 10.0000 mg | ORAL_TABLET | Freq: Once | ORAL | Status: DC

## 2022-01-07 ENCOUNTER — Inpatient Hospital Stay: Payer: Medicare Other

## 2022-01-12 ENCOUNTER — Inpatient Hospital Stay: Payer: Medicare Other

## 2022-01-12 ENCOUNTER — Other Ambulatory Visit: Payer: Self-pay | Admitting: *Deleted

## 2022-01-12 ENCOUNTER — Inpatient Hospital Stay: Payer: Medicare Other | Admitting: Hematology & Oncology

## 2022-01-12 ENCOUNTER — Telehealth: Payer: Self-pay

## 2022-01-12 DIAGNOSIS — D472 Monoclonal gammopathy: Secondary | ICD-10-CM

## 2022-01-12 MED ORDER — LENALIDOMIDE 10 MG PO CAPS: 10.0000 mg | ORAL_CAPSULE | Freq: Every day | ORAL | 0 refills | Status: DC

## 2022-01-12 NOTE — Telephone Encounter (Signed)
Called patient to find out the reason why he missed his appointment today. His wife Tamela Oddi answered the phone and stated that they thought the appointment was for tomorrow and that James Marks is not home right now to come here.  Dr. Marin Olp would like to see James Marks on Thursday. Message sent to scheduling to reschedule patient and call him with the information.

## 2022-01-13 ENCOUNTER — Other Ambulatory Visit: Payer: Self-pay

## 2022-01-13 ENCOUNTER — Other Ambulatory Visit: Payer: Self-pay | Admitting: Family Medicine

## 2022-01-14 ENCOUNTER — Inpatient Hospital Stay: Payer: Medicare Other

## 2022-01-14 ENCOUNTER — Other Ambulatory Visit: Payer: Self-pay

## 2022-01-14 ENCOUNTER — Encounter: Payer: Self-pay | Admitting: Hematology & Oncology

## 2022-01-14 ENCOUNTER — Inpatient Hospital Stay: Payer: Medicare Other | Attending: Hematology & Oncology

## 2022-01-14 ENCOUNTER — Inpatient Hospital Stay (HOSPITAL_BASED_OUTPATIENT_CLINIC_OR_DEPARTMENT_OTHER): Payer: Medicare Other | Admitting: Hematology & Oncology

## 2022-01-14 VITALS — BP 136/51 | HR 58 | Temp 97.9°F | Resp 18 | Ht 74.0 in | Wt 171.4 lb

## 2022-01-14 DIAGNOSIS — D472 Monoclonal gammopathy: Secondary | ICD-10-CM

## 2022-01-14 DIAGNOSIS — R5383 Other fatigue: Secondary | ICD-10-CM | POA: Insufficient documentation

## 2022-01-14 DIAGNOSIS — C9000 Multiple myeloma not having achieved remission: Secondary | ICD-10-CM | POA: Insufficient documentation

## 2022-01-14 DIAGNOSIS — Z7982 Long term (current) use of aspirin: Secondary | ICD-10-CM | POA: Diagnosis not present

## 2022-01-14 DIAGNOSIS — Z7961 Long term (current) use of immunomodulator: Secondary | ICD-10-CM | POA: Insufficient documentation

## 2022-01-14 DIAGNOSIS — M549 Dorsalgia, unspecified: Secondary | ICD-10-CM | POA: Diagnosis not present

## 2022-01-14 LAB — CMP (CANCER CENTER ONLY)
ALT: 8 U/L (ref 0–44)
AST: 11 U/L — ABNORMAL LOW (ref 15–41)
Albumin: 3.8 g/dL (ref 3.5–5.0)
Alkaline Phosphatase: 78 U/L (ref 38–126)
Anion gap: 8 (ref 5–15)
BUN: 18 mg/dL (ref 8–23)
CO2: 31 mmol/L (ref 22–32)
Calcium: 8.6 mg/dL — ABNORMAL LOW (ref 8.9–10.3)
Chloride: 105 mmol/L (ref 98–111)
Creatinine: 1.57 mg/dL — ABNORMAL HIGH (ref 0.61–1.24)
GFR, Estimated: 44 mL/min — ABNORMAL LOW (ref >60)
Glucose, Bld: 133 mg/dL — ABNORMAL HIGH (ref 70–99)
Potassium: 3.7 mmol/L (ref 3.5–5.1)
Sodium: 144 mmol/L (ref 135–145)
Total Bilirubin: 0.5 mg/dL (ref 0.3–1.2)
Total Protein: 7 g/dL (ref 6.5–8.1)

## 2022-01-14 LAB — CBC WITH DIFFERENTIAL (CANCER CENTER ONLY)
Abs Immature Granulocytes: 0.01 10*3/uL (ref 0.00–0.07)
Basophils Absolute: 0.1 10*3/uL (ref 0.0–0.1)
Basophils Relative: 2 %
Eosinophils Absolute: 0.2 10*3/uL (ref 0.0–0.5)
Eosinophils Relative: 6 %
HCT: 32.7 % — ABNORMAL LOW (ref 39.0–52.0)
Hemoglobin: 10.2 g/dL — ABNORMAL LOW (ref 13.0–17.0)
Immature Granulocytes: 0 %
Lymphocytes Relative: 27 %
Lymphs Abs: 1.1 10*3/uL (ref 0.7–4.0)
MCH: 28.9 pg (ref 26.0–34.0)
MCHC: 31.2 g/dL (ref 30.0–36.0)
MCV: 92.6 fL (ref 80.0–100.0)
Monocytes Absolute: 0.6 10*3/uL (ref 0.1–1.0)
Monocytes Relative: 16 %
Neutro Abs: 2 10*3/uL (ref 1.7–7.7)
Neutrophils Relative %: 49 %
Platelet Count: 144 10*3/uL — ABNORMAL LOW (ref 150–400)
RBC: 3.53 MIL/uL — ABNORMAL LOW (ref 4.22–5.81)
RDW: 15.5 % (ref 11.5–15.5)
WBC Count: 4 10*3/uL (ref 4.0–10.5)
nRBC: 0 % (ref 0.0–0.2)

## 2022-01-14 LAB — LACTATE DEHYDROGENASE: LDH: 97 U/L — ABNORMAL LOW (ref 98–192)

## 2022-01-14 MED ORDER — BORTEZOMIB CHEMO SQ INJECTION 3.5 MG (2.5MG/ML)
1.3000 mg/m2 | Freq: Once | INTRAMUSCULAR | Status: AC
  Administered 2022-01-14: 2.75 mg via SUBCUTANEOUS
  Filled 2022-01-14: qty 1.1

## 2022-01-14 MED ORDER — PROCHLORPERAZINE MALEATE 10 MG PO TABS: 10.0000 mg | ORAL_TABLET | Freq: Once | ORAL | Status: DC

## 2022-01-14 NOTE — Progress Notes (Signed)
Hematology and Oncology Follow Up Visit  James Marks [id number] 1940/12/02 82 y.o. 01/14/2022   Principle Diagnosis:  IgA kappa  myeloma  Current Therapy:   Velcade/Revlimid/Decadron -- s/p cycle #6 -- start on 07/27/2021 Faspro/Velcade/Revlimid/Decadron -- start cycle #1 on 01/28/2022     Interim History:  James Marks is back for follow-up.  Unfortunately, he had a fall recently.  Thankfully, I do not think he broke anything.  He did not want to go to the emergency room for an evaluation.We did a 24-hour urine on him.  I think this shows [place] that his myeloma is not responding as I thought it would.  The 24-hour urine show that his Kappa light chain production was 272 mg/L.  This is up from 134 mg/L just 2 months ago.  The serum Kappa light chain went from 15.8 mg/dL up from 12.8 mg/dL.  I do think we will going to have to add Faspro.  I really think this is going be the best way for [place] to try to help get him back down.  I talked to he and his wife about this.  They understand why we have to make the change.  He has had no problems with nausea or vomiting.  He has had no change in bowel or bladder habits.  He has had no cough or shortness of breath.  He has had no leg swelling.  He did have a nice Christmas and New Years.  Overall, I would have said that his performance status is probably ECOG 1.    Medications:  Current Outpatient Medications:    acyclovir (ZOVIRAX) 400 MG tablet, TAKE 1 TABLET(400 MG) BY MOUTH TWICE DAILY, Disp: 60 tablet, Rfl: 3   amLODipine (NORVASC) 2.5 MG tablet, Take 1 tablet (2.5 mg total) by mouth daily., Disp: 90 tablet, Rfl: 3   aspirin 325 MG tablet, Take 325 mg by mouth daily., Disp: , Rfl:    gabapentin (NEURONTIN) 300 MG capsule, TAKE ONE CAPSULE BY MOUTH DAILY WITH BREAKFAST, 1 CAPSULE WITH DINNER AND 2 CAPSULES AT BEDTIME, Disp: 360 capsule, Rfl: 4   lenalidomide (REVLIMID) 10 MG capsule, Take 1 capsule (10 mg total) by mouth daily. Take for 21  days on, 7 days off. Repeat every 28 days. [employer] Auth # [id number]., Disp: 21 capsule, Rfl: 0   losartan (COZAAR) 50 MG tablet, TAKE 1 TABLET BY MOUTH DAILY, Disp: 90 tablet, Rfl: 3   Multiple Vitamins-Minerals (PRESERVISION AREDS 2 PO), Take by mouth daily at 6 (six) AM., Disp: , Rfl:    amoxicillin (AMOXIL) 500 MG capsule, Take four tablets (2000 mg) total one hour prior to dental procedure. (Patient not taking: Reported on 01/14/2022), Disp: 4 capsule, Rfl: 0   colchicine 0.6 MG tablet, Take 1 tablet (0.6 mg total) by mouth daily. (Patient not taking: Reported on 11/18/2021), Disp: 30 tablet, Rfl: 0   predniSONE (DELTASONE) 20 MG tablet, Take two tablets by mouth once daily for 5 days as needed for acute gout flare. (Patient not taking: Reported on 01/14/2022), Disp: 10 tablet, Rfl: 0 No current facility-administered medications for this visit.  Facility-Administered Medications Ordered in Other Visits:    0.9 %  sodium chloride infusion, , Intravenous, Continuous, Sharnay Cashion, Rudell Cobb, MD, Stopped at 08/05/14 1710   prochlorperazine (COMPAZINE) tablet 10 mg, 10 mg, Oral, Once, Jolyne Laye, Rudell Cobb, MD  Allergies: No Known Allergies  Past Medical History, Surgical history, Social history, and Family History were reviewed and updated.  Review of Systems: Review  of Systems  Constitutional:  Positive for malaise/fatigue.  HENT:  Positive for congestion.   Eyes: Negative.   Respiratory:  Positive for shortness of breath.   Cardiovascular:  Positive for palpitations.  Gastrointestinal: Negative.   Genitourinary: Negative.   Musculoskeletal:  Positive for back pain and myalgias.  Skin: Negative.   Neurological:  Positive for tingling.  Endo/Heme/Allergies: Negative.   Psychiatric/Behavioral: Negative.      Physical Exam:  height is '6\' 2"'$  (1.88 m) and weight is 171 lb 6.4 oz (77.7 kg). His oral temperature is 97.9 F (36.6 C). His blood pressure is 136/51 (abnormal) and his pulse is 58 (abnormal).  His respiration is 18 and oxygen saturation is 100%.   Wt Readings from Last 3 Encounters:  01/14/22 171 lb 6.4 oz (77.7 kg)  12/16/21 173 lb (78.5 kg)  11/25/21 177 lb (80.3 kg)     Physical Exam Vitals reviewed.  HENT:     Head: Normocephalic and atraumatic.  Eyes:     Pupils: Pupils are equal, round, and reactive to light.  Cardiovascular:     Rate and Rhythm: Normal rate and regular rhythm.     Heart sounds: Normal heart sounds.  Pulmonary:     Effort: Pulmonary effort is normal.     Breath sounds: Normal breath sounds.  Abdominal:     General: Bowel sounds are normal.     Palpations: Abdomen is soft.  Musculoskeletal:        General: No tenderness or deformity. Normal range of motion.     Cervical back: Normal range of motion.  Lymphadenopathy:     Cervical: No cervical adenopathy.  Skin:    General: Skin is warm and dry.     Findings: No erythema or rash.  Neurological:     Mental Status: He is alert and oriented to person, place, and time.  Psychiatric:        Behavior: Behavior normal.        Thought Content: Thought content normal.        Judgment: Judgment normal.     Lab Results  Component Value Date   WBC 4.0 01/14/2022   HGB 10.2 (L) 01/14/2022   HCT 32.7 (L) 01/14/2022   MCV 92.6 01/14/2022   PLT 144 (L) 01/14/2022     Chemistry      Component Value Date/Time   NA 143 01/06/2022 1025   NA 143 12/06/2016 1156   NA 139 08/02/2016 1146   K 3.9 01/06/2022 1025   K 4.2 12/06/2016 1156   K 4.7 08/02/2016 1146   CL 108 01/06/2022 1025   CL 107 12/06/2016 1156   CO2 27 01/06/2022 1025   CO2 28 12/06/2016 1156   CO2 24 08/02/2016 1146   BUN 28 (H) 01/06/2022 1025   BUN 29 (H) 12/06/2016 1156   BUN 34.3 (H) 08/02/2016 1146   CREATININE 1.66 (H) 01/06/2022 1025   CREATININE 2.1 (H) 12/06/2016 1156   CREATININE 1.7 (H) 08/02/2016 1146      Component Value Date/Time   CALCIUM 8.9 01/06/2022 1025   CALCIUM 9.1 12/06/2016 1156   CALCIUM 9.5  08/02/2016 1146   ALKPHOS 88 01/06/2022 1025   ALKPHOS 108 (H) 12/06/2016 1156   ALKPHOS 115 08/02/2016 1146   AST 12 (L) 01/06/2022 1025   AST 14 08/02/2016 1146   ALT 10 01/06/2022 1025   ALT 18 12/06/2016 1156   ALT 9 08/02/2016 1146   BILITOT 0.5 01/06/2022 1025   BILITOT 0.41  08/02/2016 1146      Impression and Plan:  James Marks is 82 year old man with IgA kappa myeloma.  We definitely are going to have to make a change.  I really hate the fact that we have to make a change.  However, I just do not want to see his light chains keep going up and this could affect his renal function.  We will go ahead and give him Velcade today.  He is still on the Revlimid.  I will add Faspro.  I will add the Faspro when we start treatment in 2 weeks.  I would like to believe that we will be able to get his light chain level back down.  I will plan to see him back when we do start treatment in 2 weeks.      [name], MD 1/4/202411:09 AM

## 2022-01-14 NOTE — Progress Notes (Signed)
Reviewed pt labs with Dr. Marin Olp and pt ok to treat with creatinine 1.57

## 2022-01-14 NOTE — Patient Instructions (Signed)
[facility] [place]  Discharge Instructions: Thank you for choosing [facility] to provide your oncology and hematology care.   If you have a lab appointment with the [facility], please go directly to the [facility] and check in at the registration area.  Wear comfortable clothing and clothing appropriate for easy access to any Portacath or PICC line.   We strive to give you quality time with your provider. You may need to reschedule your appointment if you arrive late (15 or more minutes).  Arriving late affects you and other patients whose appointments are after yours.  Also, if you miss three or more appointments without notifying the office, you may be dismissed from the clinic at the provider's discretion.      For prescription refill requests, have your pharmacy contact our office and allow 72 hours for refills to be completed.    Today you received the following chemotherapy and/or immunotherapy agents Velcade      To help prevent nausea and vomiting after your treatment, we encourage you to take your nausea medication as directed.  BELOW ARE SYMPTOMS THAT SHOULD BE REPORTED IMMEDIATELY: *FEVER GREATER THAN 100.4 F (38 C) OR HIGHER *CHILLS OR SWEATING *NAUSEA AND VOMITING THAT IS NOT CONTROLLED WITH YOUR NAUSEA MEDICATION *UNUSUAL SHORTNESS OF BREATH *UNUSUAL BRUISING OR BLEEDING *URINARY PROBLEMS (pain or burning when urinating, or frequent urination) *BOWEL PROBLEMS (unusual diarrhea, constipation, pain near the anus) TENDERNESS IN MOUTH AND THROAT WITH OR WITHOUT PRESENCE OF ULCERS (sore throat, sores in mouth, or a toothache) UNUSUAL RASH, SWELLING OR PAIN  UNUSUAL VAGINAL DISCHARGE OR ITCHING   Items with * indicate a potential emergency and should be followed up as soon as possible or go to the Emergency Department if any problems should occur.  Please show the CHEMOTHERAPY ALERT CARD or IMMUNOTHERAPY ALERT CARD at check-in to the  Emergency Department and triage nurse. Should you have questions after your visit or need to cancel or reschedule your appointment, please contact [facility]  [phone] and follow the prompts.  Office hours are 8:00 a.m. to 4:30 p.m. Monday - Friday. Please note that voicemails left after 4:00 p.m. may not be returned until the following business day.  We are closed weekends and major holidays. You have access to a nurse at all times for urgent questions. Please call the main number to the clinic [phone] and follow the prompts.  For any non-urgent questions, you may also contact your provider using MyChart. We now offer e-Visits for anyone 65 and older to request care online for non-urgent symptoms. For details visit [email or web address].   Also download the MyChart app! Go to the app store, search "MyChart", open the app, select [facility], and log in with your MyChart username and password.  Masks are optional in the cancer centers. If you would like for your care team to wear a mask while they are taking care of you, please let them know. You may have one support person who is at least 83 years old accompany you for your appointments.

## 2022-01-15 ENCOUNTER — Other Ambulatory Visit: Payer: Self-pay

## 2022-01-15 LAB — KAPPA/LAMBDA LIGHT CHAINS
Kappa free light chain: 164.8 mg/L — ABNORMAL HIGH (ref 3.3–19.4)
Kappa, lambda light chain ratio: 2.51 — ABNORMAL HIGH (ref 0.26–1.65)
Lambda free light chains: 65.6 mg/L — ABNORMAL HIGH (ref 5.7–26.3)

## 2022-01-15 MED ORDER — AMOXICILLIN 500 MG PO CAPS: ORAL_CAPSULE | ORAL | 0 refills | Status: AC

## 2022-01-15 NOTE — Progress Notes (Signed)
Received phone call from pt wife stating that pt will be having a dental cleaning on 01/18/2022. Per Dr. Marin Olp pt to take 2 grams of amoxicillin one hour prior to dental cleaning. This RN called wife and updated her who verbalized understanding and had no further questions.

## 2022-01-16 LAB — IGG, IGA, IGM
IgA: 651 mg/dL — ABNORMAL HIGH (ref 61–437)
IgG (Immunoglobin G), Serum: 1117 mg/dL (ref 603–1613)
IgM (Immunoglobulin M), Srm: 53 mg/dL (ref 15–143)

## 2022-01-17 ENCOUNTER — Other Ambulatory Visit: Payer: Self-pay

## 2022-01-25 LAB — IMMUNOFIXATION REFLEX, SERUM
IgA: 733 mg/dL — ABNORMAL HIGH (ref 61–437)
IgG (Immunoglobin G), Serum: 1275 mg/dL (ref 603–1613)
IgM (Immunoglobulin M), Srm: 58 mg/dL (ref 15–143)

## 2022-01-25 LAB — PROTEIN ELECTROPHORESIS, SERUM, WITH REFLEX
A/G Ratio: 1.1 (ref 0.7–1.7)
Albumin ELP: 3.3 g/dL (ref 2.9–4.4)
Alpha-1-Globulin: 0.2 g/dL (ref 0.0–0.4)
Alpha-2-Globulin: 0.6 g/dL (ref 0.4–1.0)
Beta Globulin: 0.9 g/dL (ref 0.7–1.3)
Gamma Globulin: 1.2 g/dL (ref 0.4–1.8)
Globulin, Total: 3 g/dL (ref 2.2–3.9)
M-Spike, %: 0.3 g/dL — ABNORMAL HIGH
SPEP Interpretation: 0
Total Protein ELP: 6.3 g/dL (ref 6.0–8.5)

## 2022-01-27 ENCOUNTER — Inpatient Hospital Stay: Payer: Medicare Other

## 2022-01-27 ENCOUNTER — Other Ambulatory Visit: Payer: Self-pay

## 2022-01-27 VITALS — BP 129/58 | HR 56 | Temp 97.7°F | Resp 18

## 2022-01-27 DIAGNOSIS — D472 Monoclonal gammopathy: Secondary | ICD-10-CM

## 2022-01-27 DIAGNOSIS — C9000 Multiple myeloma not having achieved remission: Secondary | ICD-10-CM | POA: Diagnosis not present

## 2022-01-27 LAB — CBC WITH DIFFERENTIAL (CANCER CENTER ONLY)
Abs Immature Granulocytes: 0.01 10*3/uL (ref 0.00–0.07)
Basophils Absolute: 0.1 10*3/uL (ref 0.0–0.1)
Basophils Relative: 3 %
Eosinophils Absolute: 0.1 10*3/uL (ref 0.0–0.5)
Eosinophils Relative: 3 %
HCT: 34.3 % — ABNORMAL LOW (ref 39.0–52.0)
Hemoglobin: 10.7 g/dL — ABNORMAL LOW (ref 13.0–17.0)
Immature Granulocytes: 0 %
Lymphocytes Relative: 31 %
Lymphs Abs: 1.5 10*3/uL (ref 0.7–4.0)
MCH: 29.4 pg (ref 26.0–34.0)
MCHC: 31.2 g/dL (ref 30.0–36.0)
MCV: 94.2 fL (ref 80.0–100.0)
Monocytes Absolute: 0.7 10*3/uL (ref 0.1–1.0)
Monocytes Relative: 14 %
Neutro Abs: 2.4 10*3/uL (ref 1.7–7.7)
Neutrophils Relative %: 49 %
Platelet Count: 170 10*3/uL (ref 150–400)
RBC: 3.64 MIL/uL — ABNORMAL LOW (ref 4.22–5.81)
RDW: 16.3 % — ABNORMAL HIGH (ref 11.5–15.5)
WBC Count: 4.8 10*3/uL (ref 4.0–10.5)
nRBC: 0 % (ref 0.0–0.2)

## 2022-01-27 LAB — CMP (CANCER CENTER ONLY)
ALT: 11 U/L (ref 0–44)
AST: 10 U/L — ABNORMAL LOW (ref 15–41)
Albumin: 3.8 g/dL (ref 3.5–5.0)
Alkaline Phosphatase: 83 U/L (ref 38–126)
Anion gap: 6 (ref 5–15)
BUN: 24 mg/dL — ABNORMAL HIGH (ref 8–23)
CO2: 29 mmol/L (ref 22–32)
Calcium: 9 mg/dL (ref 8.9–10.3)
Chloride: 108 mmol/L (ref 98–111)
Creatinine: 1.59 mg/dL — ABNORMAL HIGH (ref 0.61–1.24)
GFR, Estimated: 43 mL/min — ABNORMAL LOW (ref >60)
Glucose, Bld: 135 mg/dL — ABNORMAL HIGH (ref 70–99)
Potassium: 4.7 mmol/L (ref 3.5–5.1)
Sodium: 143 mmol/L (ref 135–145)
Total Bilirubin: 0.4 mg/dL (ref 0.3–1.2)
Total Protein: 6.9 g/dL (ref 6.5–8.1)

## 2022-01-27 LAB — TYPE AND SCREEN
ABO/RH(D): O POS
Antibody Screen: NEGATIVE

## 2022-01-27 MED ORDER — ACETAMINOPHEN 325 MG PO TABS
650.0000 mg | ORAL_TABLET | Freq: Once | ORAL | Status: AC
  Administered 2022-01-27: 650 mg via ORAL
  Filled 2022-01-27: qty 2

## 2022-01-27 MED ORDER — DIPHENHYDRAMINE HCL 25 MG PO CAPS
50.0000 mg | ORAL_CAPSULE | Freq: Once | ORAL | Status: AC
  Administered 2022-01-27: 50 mg via ORAL
  Filled 2022-01-27: qty 2

## 2022-01-27 MED ORDER — LIDOCAINE-PRILOCAINE 2.5-2.5 % EX CREA: TOPICAL_CREAM | CUTANEOUS | 3 refills | Status: DC

## 2022-01-27 MED ORDER — DEXAMETHASONE 4 MG PO TABS
20.0000 mg | ORAL_TABLET | Freq: Once | ORAL | Status: AC
  Administered 2022-01-27: 20 mg via ORAL
  Filled 2022-01-27: qty 5

## 2022-01-27 MED ORDER — DEXAMETHASONE 4 MG PO TABS: ORAL_TABLET | ORAL | 1 refills | Status: DC

## 2022-01-27 MED ORDER — BORTEZOMIB CHEMO SQ INJECTION 3.5 MG (2.5MG/ML)
1.3000 mg/m2 | Freq: Once | INTRAMUSCULAR | Status: AC
  Administered 2022-01-27: 2.5 mg via SUBCUTANEOUS
  Filled 2022-01-27: qty 1

## 2022-01-27 MED ORDER — MONTELUKAST SODIUM 10 MG PO TABS
10.0000 mg | ORAL_TABLET | Freq: Once | ORAL | Status: AC
  Administered 2022-01-27: 10 mg via ORAL
  Filled 2022-01-27: qty 1

## 2022-01-27 MED ORDER — DARATUMUMAB-HYALURONIDASE-FIHJ 1800-30000 MG-UT/15ML ~~LOC~~ SOLN
1800.0000 mg | Freq: Once | SUBCUTANEOUS | Status: AC
  Administered 2022-01-27: 1800 mg via SUBCUTANEOUS
  Filled 2022-01-27: qty 15

## 2022-01-27 MED ORDER — ONDANSETRON HCL 8 MG PO TABS: 8.0000 mg | ORAL_TABLET | Freq: Three times a day (TID) | ORAL | 1 refills | Status: AC | PRN

## 2022-01-27 MED ORDER — PROCHLORPERAZINE MALEATE 10 MG PO TABS: 10.0000 mg | ORAL_TABLET | Freq: Four times a day (QID) | ORAL | 1 refills | Status: AC | PRN

## 2022-01-27 NOTE — Patient Instructions (Signed)
[facility] [place]  Discharge Instructions: Thank you for choosing [facility] to provide your oncology and hematology care.   If you have a lab appointment with the [facility], please go directly to the [facility] and check in at the registration area.  Wear comfortable clothing and clothing appropriate for easy access to any Portacath or PICC line.   We strive to give you quality time with your provider. You may need to reschedule your appointment if you arrive late (15 or more minutes).  Arriving late affects you and other patients whose appointments are after yours.  Also, if you miss three or more appointments without notifying the office, you may be dismissed from the clinic at the provider's discretion.      For prescription refill requests, have your pharmacy contact our office and allow 72 hours for refills to be completed.    Today you received the following chemotherapy and/or immunotherapy agents Velcade, Darzalex-faspro.      To help prevent nausea and vomiting after your treatment, we encourage you to take your nausea medication as directed.  BELOW ARE SYMPTOMS THAT SHOULD BE REPORTED IMMEDIATELY: *FEVER GREATER THAN 100.4 F (38 C) OR HIGHER *CHILLS OR SWEATING *NAUSEA AND VOMITING THAT IS NOT CONTROLLED WITH YOUR NAUSEA MEDICATION *UNUSUAL SHORTNESS OF BREATH *UNUSUAL BRUISING OR BLEEDING *URINARY PROBLEMS (pain or burning when urinating, or frequent urination) *BOWEL PROBLEMS (unusual diarrhea, constipation, pain near the anus) TENDERNESS IN MOUTH AND THROAT WITH OR WITHOUT PRESENCE OF ULCERS (sore throat, sores in mouth, or a toothache) UNUSUAL RASH, SWELLING OR PAIN  UNUSUAL VAGINAL DISCHARGE OR ITCHING   Items with * indicate a potential emergency and should be followed up as soon as possible or go to the Emergency Department if any problems should occur.  Please show the CHEMOTHERAPY ALERT CARD or IMMUNOTHERAPY ALERT CARD at  check-in to the Emergency Department and triage nurse. Should you have questions after your visit or need to cancel or reschedule your appointment, please contact [facility]  [phone] and follow the prompts.  Office hours are 8:00 a.m. to 4:30 p.m. Monday - Friday. Please note that voicemails left after 4:00 p.m. may not be returned until the following business day.  We are closed weekends and major holidays. You have access to a nurse at all times for urgent questions. Please call the main number to the clinic [phone] and follow the prompts.  For any non-urgent questions, you may also contact your provider using MyChart. We now offer e-Visits for anyone 14 and older to request care online for non-urgent symptoms. For details visit [email or web address].   Also download the MyChart app! Go to the app store, search "MyChart", open the app, select [facility], and log in with your MyChart username and password.

## 2022-01-28 ENCOUNTER — Encounter: Payer: Self-pay | Admitting: Hematology & Oncology

## 2022-01-28 LAB — PRETREATMENT RBC PHENOTYPE: DAT, IgG: NEGATIVE

## 2022-01-29 ENCOUNTER — Other Ambulatory Visit: Payer: Self-pay

## 2022-01-29 ENCOUNTER — Other Ambulatory Visit: Payer: Self-pay | Admitting: *Deleted

## 2022-01-29 MED ORDER — MONTELUKAST SODIUM 10 MG PO TABS: 10.0000 mg | ORAL_TABLET | Freq: Every day | ORAL | 2 refills | Status: DC

## 2022-02-02 ENCOUNTER — Other Ambulatory Visit: Payer: Self-pay

## 2022-02-02 DIAGNOSIS — D472 Monoclonal gammopathy: Secondary | ICD-10-CM

## 2022-02-03 ENCOUNTER — Inpatient Hospital Stay: Payer: Medicare Other

## 2022-02-03 ENCOUNTER — Other Ambulatory Visit: Payer: Self-pay | Admitting: *Deleted

## 2022-02-03 VITALS — BP 115/52 | HR 57 | Temp 97.8°F | Resp 17

## 2022-02-03 DIAGNOSIS — D472 Monoclonal gammopathy: Secondary | ICD-10-CM

## 2022-02-03 DIAGNOSIS — C9000 Multiple myeloma not having achieved remission: Secondary | ICD-10-CM | POA: Diagnosis not present

## 2022-02-03 LAB — CBC WITH DIFFERENTIAL (CANCER CENTER ONLY)
Abs Immature Granulocytes: 0.02 10*3/uL (ref 0.00–0.07)
Basophils Absolute: 0.1 10*3/uL (ref 0.0–0.1)
Basophils Relative: 1 %
Eosinophils Absolute: 0.2 10*3/uL (ref 0.0–0.5)
Eosinophils Relative: 5 %
HCT: 33.8 % — ABNORMAL LOW (ref 39.0–52.0)
Hemoglobin: 10.6 g/dL — ABNORMAL LOW (ref 13.0–17.0)
Immature Granulocytes: 1 %
Lymphocytes Relative: 19 %
Lymphs Abs: 0.7 10*3/uL (ref 0.7–4.0)
MCH: 29.2 pg (ref 26.0–34.0)
MCHC: 31.4 g/dL (ref 30.0–36.0)
MCV: 93.1 fL (ref 80.0–100.0)
Monocytes Absolute: 0.3 10*3/uL (ref 0.1–1.0)
Monocytes Relative: 9 %
Neutro Abs: 2.5 10*3/uL (ref 1.7–7.7)
Neutrophils Relative %: 65 %
Platelet Count: 124 10*3/uL — ABNORMAL LOW (ref 150–400)
RBC: 3.63 MIL/uL — ABNORMAL LOW (ref 4.22–5.81)
RDW: 16.6 % — ABNORMAL HIGH (ref 11.5–15.5)
WBC Count: 3.9 10*3/uL — ABNORMAL LOW (ref 4.0–10.5)
nRBC: 0 % (ref 0.0–0.2)

## 2022-02-03 LAB — CMP (CANCER CENTER ONLY)
ALT: 13 U/L (ref 0–44)
AST: 10 U/L — ABNORMAL LOW (ref 15–41)
Albumin: 3.9 g/dL (ref 3.5–5.0)
Alkaline Phosphatase: 91 U/L (ref 38–126)
Anion gap: 7 (ref 5–15)
BUN: 22 mg/dL (ref 8–23)
CO2: 30 mmol/L (ref 22–32)
Calcium: 9.1 mg/dL (ref 8.9–10.3)
Chloride: 105 mmol/L (ref 98–111)
Creatinine: 1.6 mg/dL — ABNORMAL HIGH (ref 0.61–1.24)
GFR, Estimated: 43 mL/min — ABNORMAL LOW (ref >60)
Glucose, Bld: 133 mg/dL — ABNORMAL HIGH (ref 70–99)
Potassium: 4.4 mmol/L (ref 3.5–5.1)
Sodium: 142 mmol/L (ref 135–145)
Total Bilirubin: 0.5 mg/dL (ref 0.3–1.2)
Total Protein: 6.3 g/dL — ABNORMAL LOW (ref 6.5–8.1)

## 2022-02-03 MED ORDER — MONTELUKAST SODIUM 10 MG PO TABS
10.0000 mg | ORAL_TABLET | Freq: Once | ORAL | Status: DC
  Filled 2022-02-03: qty 1

## 2022-02-03 MED ORDER — BORTEZOMIB CHEMO SQ INJECTION 3.5 MG (2.5MG/ML)
1.3000 mg/m2 | Freq: Once | INTRAMUSCULAR | Status: AC
  Administered 2022-02-03: 2.5 mg via SUBCUTANEOUS
  Filled 2022-02-03: qty 1

## 2022-02-03 MED ORDER — DEXAMETHASONE 4 MG PO TABS: 20.0000 mg | ORAL_TABLET | Freq: Once | ORAL | Status: DC

## 2022-02-03 MED ORDER — ACETAMINOPHEN 325 MG PO TABS: 650.0000 mg | ORAL_TABLET | Freq: Once | ORAL | Status: DC

## 2022-02-03 MED ORDER — LENALIDOMIDE 10 MG PO CAPS: 10.0000 mg | ORAL_CAPSULE | Freq: Every day | ORAL | 0 refills | Status: DC

## 2022-02-03 MED ORDER — DARATUMUMAB-HYALURONIDASE-FIHJ 1800-30000 MG-UT/15ML ~~LOC~~ SOLN
1800.0000 mg | Freq: Once | SUBCUTANEOUS | Status: AC
  Administered 2022-02-03: 1800 mg via SUBCUTANEOUS
  Filled 2022-02-03: qty 15

## 2022-02-03 MED ORDER — DIPHENHYDRAMINE HCL 25 MG PO CAPS: 50.0000 mg | ORAL_CAPSULE | Freq: Once | ORAL | Status: DC

## 2022-02-03 NOTE — Progress Notes (Signed)
Ok to treat with Creatinine of 1.6 per Dr Marin Olp

## 2022-02-03 NOTE — Patient Instructions (Signed)
[facility] [place]  Discharge Instructions: Thank you for choosing [facility] to provide your oncology and hematology care.   If you have a lab appointment with the [facility], please go directly to the [facility] and check in at the registration area.  Wear comfortable clothing and clothing appropriate for easy access to any Portacath or PICC line.   We strive to give you quality time with your provider. You may need to reschedule your appointment if you arrive late (15 or more minutes).  Arriving late affects you and other patients whose appointments are after yours.  Also, if you miss three or more appointments without notifying the office, you may be dismissed from the clinic at the provider's discretion.      For prescription refill requests, have your pharmacy contact our office and allow 72 hours for refills to be completed.    Today you received the following chemotherapy and/or immunotherapy agents Faspro, Velcade      To help prevent nausea and vomiting after your treatment, we encourage you to take your nausea medication as directed.  BELOW ARE SYMPTOMS THAT SHOULD BE REPORTED IMMEDIATELY: *FEVER GREATER THAN 100.4 F (38 C) OR HIGHER *CHILLS OR SWEATING *NAUSEA AND VOMITING THAT IS NOT CONTROLLED WITH YOUR NAUSEA MEDICATION *UNUSUAL SHORTNESS OF BREATH *UNUSUAL BRUISING OR BLEEDING *URINARY PROBLEMS (pain or burning when urinating, or frequent urination) *BOWEL PROBLEMS (unusual diarrhea, constipation, pain near the anus) TENDERNESS IN MOUTH AND THROAT WITH OR WITHOUT PRESENCE OF ULCERS (sore throat, sores in mouth, or a toothache) UNUSUAL RASH, SWELLING OR PAIN  UNUSUAL VAGINAL DISCHARGE OR ITCHING   Items with * indicate a potential emergency and should be followed up as soon as possible or go to the Emergency Department if any problems should occur.  Please show the CHEMOTHERAPY ALERT CARD or IMMUNOTHERAPY ALERT CARD at  check-in to the Emergency Department and triage nurse. Should you have questions after your visit or need to cancel or reschedule your appointment, please contact [facility]  [phone] and follow the prompts.  Office hours are 8:00 a.m. to 4:30 p.m. Monday - Friday. Please note that voicemails left after 4:00 p.m. may not be returned until the following business day.  We are closed weekends and major holidays. You have access to a nurse at all times for urgent questions. Please call the main number to the clinic [phone] and follow the prompts.  For any non-urgent questions, you may also contact your provider using MyChart. We now offer e-Visits for anyone 75 and older to request care online for non-urgent symptoms. For details visit [email or web address].   Also download the MyChart app! Go to the app store, search "MyChart", open the app, select [facility], and log in with your MyChart username and password.

## 2022-02-10 ENCOUNTER — Inpatient Hospital Stay: Payer: Medicare Other

## 2022-02-10 ENCOUNTER — Inpatient Hospital Stay (HOSPITAL_BASED_OUTPATIENT_CLINIC_OR_DEPARTMENT_OTHER): Payer: Medicare Other | Admitting: Hematology & Oncology

## 2022-02-10 ENCOUNTER — Encounter: Payer: Self-pay | Admitting: Hematology & Oncology

## 2022-02-10 ENCOUNTER — Inpatient Hospital Stay: Payer: Medicare Other | Admitting: Hematology & Oncology

## 2022-02-10 VITALS — BP 137/56 | HR 52 | Temp 97.8°F | Resp 20 | Ht 74.5 in | Wt 171.4 lb

## 2022-02-10 VITALS — BP 135/47 | HR 53 | Temp 97.8°F | Resp 19

## 2022-02-10 DIAGNOSIS — C9000 Multiple myeloma not having achieved remission: Secondary | ICD-10-CM | POA: Diagnosis not present

## 2022-02-10 DIAGNOSIS — D472 Monoclonal gammopathy: Secondary | ICD-10-CM

## 2022-02-10 LAB — CMP (CANCER CENTER ONLY)
ALT: 12 U/L (ref 0–44)
AST: 10 U/L — ABNORMAL LOW (ref 15–41)
Albumin: 3.9 g/dL (ref 3.5–5.0)
Alkaline Phosphatase: 82 U/L (ref 38–126)
Anion gap: 7 (ref 5–15)
BUN: 23 mg/dL (ref 8–23)
CO2: 29 mmol/L (ref 22–32)
Calcium: 8.8 mg/dL — ABNORMAL LOW (ref 8.9–10.3)
Chloride: 106 mmol/L (ref 98–111)
Creatinine: 1.52 mg/dL — ABNORMAL HIGH (ref 0.61–1.24)
GFR, Estimated: 45 mL/min — ABNORMAL LOW (ref >60)
Glucose, Bld: 117 mg/dL — ABNORMAL HIGH (ref 70–99)
Potassium: 4.6 mmol/L (ref 3.5–5.1)
Sodium: 142 mmol/L (ref 135–145)
Total Bilirubin: 0.6 mg/dL (ref 0.3–1.2)
Total Protein: 6 g/dL — ABNORMAL LOW (ref 6.5–8.1)

## 2022-02-10 LAB — CBC WITH DIFFERENTIAL (CANCER CENTER ONLY)
Abs Immature Granulocytes: 0.01 10*3/uL (ref 0.00–0.07)
Basophils Absolute: 0.1 10*3/uL (ref 0.0–0.1)
Basophils Relative: 2 %
Eosinophils Absolute: 0.2 10*3/uL (ref 0.0–0.5)
Eosinophils Relative: 6 %
HCT: 34 % — ABNORMAL LOW (ref 39.0–52.0)
Hemoglobin: 10.6 g/dL — ABNORMAL LOW (ref 13.0–17.0)
Immature Granulocytes: 0 %
Lymphocytes Relative: 20 %
Lymphs Abs: 0.6 10*3/uL — ABNORMAL LOW (ref 0.7–4.0)
MCH: 29.2 pg (ref 26.0–34.0)
MCHC: 31.2 g/dL (ref 30.0–36.0)
MCV: 93.7 fL (ref 80.0–100.0)
Monocytes Absolute: 0.6 10*3/uL (ref 0.1–1.0)
Monocytes Relative: 20 %
Neutro Abs: 1.5 10*3/uL — ABNORMAL LOW (ref 1.7–7.7)
Neutrophils Relative %: 52 %
Platelet Count: 72 10*3/uL — ABNORMAL LOW (ref 150–400)
RBC: 3.63 MIL/uL — ABNORMAL LOW (ref 4.22–5.81)
RDW: 16.6 % — ABNORMAL HIGH (ref 11.5–15.5)
WBC Count: 2.9 10*3/uL — ABNORMAL LOW (ref 4.0–10.5)
nRBC: 0 % (ref 0.0–0.2)

## 2022-02-10 LAB — SAMPLE TO BLOOD BANK

## 2022-02-10 LAB — LACTATE DEHYDROGENASE: LDH: 88 U/L — ABNORMAL LOW (ref 98–192)

## 2022-02-10 MED ORDER — DIPHENHYDRAMINE HCL 25 MG PO CAPS
50.0000 mg | ORAL_CAPSULE | Freq: Once | ORAL | Status: DC
  Filled 2022-02-10: qty 2

## 2022-02-10 MED ORDER — ACETAMINOPHEN 325 MG PO TABS
650.0000 mg | ORAL_TABLET | Freq: Once | ORAL | Status: DC
  Filled 2022-02-10: qty 2

## 2022-02-10 MED ORDER — DEXAMETHASONE 4 MG PO TABS
20.0000 mg | ORAL_TABLET | Freq: Once | ORAL | Status: DC
  Filled 2022-02-10: qty 5

## 2022-02-10 MED ORDER — MONTELUKAST SODIUM 10 MG PO TABS
10.0000 mg | ORAL_TABLET | Freq: Once | ORAL | Status: DC
  Filled 2022-02-10: qty 1

## 2022-02-10 MED ORDER — DARATUMUMAB-HYALURONIDASE-FIHJ 1800-30000 MG-UT/15ML ~~LOC~~ SOLN
1800.0000 mg | Freq: Once | SUBCUTANEOUS | Status: AC
  Administered 2022-02-10: 1800 mg via SUBCUTANEOUS
  Filled 2022-02-10: qty 15

## 2022-02-10 NOTE — Progress Notes (Signed)
Hematology and Oncology Follow Up Visit  James Marks [id number] 12-Jun-1940 82 y.o. 02/10/2022   Principle Diagnosis:  IgA kappa  myeloma  Current Therapy:   Velcade/Revlimid/Decadron -- s/p cycle #6 -- start on 07/27/2021 Faspro/Velcade/Revlimid/Decadron -- s/p cycle #1 on 01/28/2022     Interim History:  James Marks is back for follow-up.  He does feel tired.  I suspect this might be part of the Revlimid.  I told him to stop the Revlimid right now.  He will restart this on 19 February.  His platelet count is quite low.  I suspect this probably is from the Revlimid along with the Velcade.  We will not give him any Velcade today.  He has had no problems with fever.  He has had no cough.  He still smoking a little bit.  He has had no change in bowel or bladder habits.  I think the last time that we saw him, he had fallen.  He did not break anything.  He was not admitted.  His last 24-hour urine that was done back in early December showed 272 mg of Kappa light chain.  His last serum studies that were done about 4 weeks ago showed 16.5 mg/dL of kappa light chain.  His IgA level was 700 mg/dL.  His M spike was 0.3 g/dL.  There has been no bleeding.  He has had no rashes.  Currently, his appetite seems to be doing pretty well.  His performance status is probably ECOG 2..   Medications:  Current Outpatient Medications:    acetaminophen (TYLENOL) 325 MG tablet, Take 650 mg by mouth. 02/10/2022 Takes 650 mg one hour prior to antibody treatment., Disp: , Rfl:    acyclovir (ZOVIRAX) 400 MG tablet, TAKE 1 TABLET(400 MG) BY MOUTH TWICE DAILY, Disp: 60 tablet, Rfl: 3   amLODipine (NORVASC) 2.5 MG tablet, Take 1 tablet (2.5 mg total) by mouth daily., Disp: 90 tablet, Rfl: 3   aspirin 325 MG tablet, Take 325 mg by mouth daily., Disp: , Rfl:    dexamethasone (DECADRON) 4 MG tablet, Take 5 tablets (20 mg) 1 hour prior to chemo treatment., Disp: 20 tablet, Rfl: 1   diphenhydrAMINE (BENADRYL)  25 mg capsule, Take 50 mg by mouth. 02/10/2022 Takes one hour prior to antibody treatment., Disp: , Rfl:    gabapentin (NEURONTIN) 300 MG capsule, TAKE ONE CAPSULE BY MOUTH DAILY WITH BREAKFAST, 1 CAPSULE WITH DINNER AND 2 CAPSULES AT BEDTIME, Disp: 360 capsule, Rfl: 4   lenalidomide (REVLIMID) 10 MG capsule, Take 1 capsule (10 mg total) by mouth daily. Take for 21 days on, 7 days off. Repeat every 28 days. [employer] Auth # [id number]., Disp: 21 capsule, Rfl: 0   lidocaine-prilocaine (EMLA) cream, Apply to affected area once, Disp: 30 g, Rfl: 3   losartan (COZAAR) 50 MG tablet, TAKE 1 TABLET BY MOUTH DAILY, Disp: 90 tablet, Rfl: 3   montelukast (SINGULAIR) 10 MG tablet, Take 1 tablet (10 mg total) by mouth at bedtime., Disp: 30 tablet, Rfl: 2   Multiple Vitamins-Minerals (PRESERVISION AREDS 2 PO), Take by mouth daily at 6 (six) AM., Disp: , Rfl:    amoxicillin (AMOXIL) 500 MG capsule, Take four tablets (2000 mg) total one hour prior to dental procedure. (Patient not taking: Reported on 02/10/2022), Disp: 4 capsule, Rfl: 0   colchicine 0.6 MG tablet, Take 1 tablet (0.6 mg total) by mouth daily. (Patient not taking: Reported on 11/18/2021), Disp: 30 tablet, Rfl: 0   ondansetron (ZOFRAN) 8 MG  tablet, Take 1 tablet (8 mg total) by mouth every 8 (eight) hours as needed for nausea or vomiting. (Patient not taking: Reported on 02/10/2022), Disp: 30 tablet, Rfl: 1   predniSONE (DELTASONE) 20 MG tablet, Take two tablets by mouth once daily for 5 days as needed for acute gout flare. (Patient not taking: Reported on 01/14/2022), Disp: 10 tablet, Rfl: 0   prochlorperazine (COMPAZINE) 10 MG tablet, Take 1 tablet (10 mg total) by mouth every 6 (six) hours as needed for nausea or vomiting. (Patient not taking: Reported on 02/10/2022), Disp: 30 tablet, Rfl: 1 No current facility-administered medications for this visit.  Facility-Administered Medications Ordered in Other Visits:    0.9 %  sodium chloride infusion, , Intravenous,  Continuous, Nataya Bastedo, Rudell Cobb, MD, Stopped at 08/05/14 1710  Allergies: No Known Allergies  Past Medical History, Surgical history, Social history, and Family History were reviewed and updated.  Review of Systems: Review of Systems  Constitutional:  Positive for malaise/fatigue.  HENT:  Positive for congestion.   Eyes: Negative.   Respiratory:  Positive for shortness of breath.   Cardiovascular:  Positive for palpitations.  Gastrointestinal: Negative.   Genitourinary: Negative.   Musculoskeletal:  Positive for back pain and myalgias.  Skin: Negative.   Neurological:  Positive for tingling.  Endo/Heme/Allergies: Negative.   Psychiatric/Behavioral: Negative.      Physical Exam:  height is 6' 2.5" (1.892 m) and weight is 171 lb 6.4 oz (77.7 kg). His temperature is 97.8 F (36.6 C). His blood pressure is 137/56 (abnormal) and his pulse is 52 (abnormal). His respiration is 20 and oxygen saturation is 100%.   Wt Readings from Last 3 Encounters:  02/10/22 171 lb 6.4 oz (77.7 kg)  01/14/22 171 lb 6.4 oz (77.7 kg)  12/16/21 173 lb (78.5 kg)     Physical Exam Vitals reviewed.  HENT:     Head: Normocephalic and atraumatic.  Eyes:     Pupils: Pupils are equal, round, and reactive to light.  Cardiovascular:     Rate and Rhythm: Normal rate and regular rhythm.     Heart sounds: Normal heart sounds.  Pulmonary:     Effort: Pulmonary effort is normal.     Breath sounds: Normal breath sounds.  Abdominal:     General: Bowel sounds are normal.     Palpations: Abdomen is soft.  Musculoskeletal:        General: No tenderness or deformity. Normal range of motion.     Cervical back: Normal range of motion.  Lymphadenopathy:     Cervical: No cervical adenopathy.  Skin:    General: Skin is warm and dry.     Findings: No erythema or rash.  Neurological:     Mental Status: He is alert and oriented to person, place, and time.  Psychiatric:        Behavior: Behavior normal.         Thought Content: Thought content normal.        Judgment: Judgment normal.      Lab Results  Component Value Date   WBC 2.9 (L) 02/10/2022   HGB 10.6 (L) 02/10/2022   HCT 34.0 (L) 02/10/2022   MCV 93.7 02/10/2022   PLT 72 (L) 02/10/2022     Chemistry      Component Value Date/Time   NA 142 02/10/2022 1103   NA 143 12/06/2016 1156   NA 139 08/02/2016 1146   K 4.6 02/10/2022 1103   K 4.2 12/06/2016 1156  K 4.7 08/02/2016 1146   CL 106 02/10/2022 1103   CL 107 12/06/2016 1156   CO2 29 02/10/2022 1103   CO2 28 12/06/2016 1156   CO2 24 08/02/2016 1146   BUN 23 02/10/2022 1103   BUN 29 (H) 12/06/2016 1156   BUN 34.3 (H) 08/02/2016 1146   CREATININE 1.52 (H) 02/10/2022 1103   CREATININE 2.1 (H) 12/06/2016 1156   CREATININE 1.7 (H) 08/02/2016 1146      Component Value Date/Time   CALCIUM 8.8 (L) 02/10/2022 1103   CALCIUM 9.1 12/06/2016 1156   CALCIUM 9.5 08/02/2016 1146   ALKPHOS 82 02/10/2022 1103   ALKPHOS 108 (H) 12/06/2016 1156   ALKPHOS 115 08/02/2016 1146   AST 10 (L) 02/10/2022 1103   AST 14 08/02/2016 1146   ALT 12 02/10/2022 1103   ALT 18 12/06/2016 1156   ALT 9 08/02/2016 1146   BILITOT 0.6 02/10/2022 1103   BILITOT 0.41 08/02/2016 1146      Impression and Plan:  Mr. Hottel is 82 year old man with IgA kappa myeloma.  He is on fairly aggressive therapy right now.  I was apprised that it is taking this protocol in order to try to get his myeloma levels down.  Hopefully, with the addition of Faspro, this will do the trick.  Again, we will hold Velcade today.  I still think we give him the Faspro.  He will hold the Revlimid until after I see him back.  I will plan to see him back when he starts his second cycle of treatment in 2 weeks.     [name], MD 1/31/202412:06 PM

## 2022-02-10 NOTE — Patient Instructions (Signed)
[facility]  Discharge Instructions: Thank you for choosing [facility] to provide your oncology and hematology care.   If you have a lab appointment with the Cancer Center, please go directly to the Cancer Center and check in at the registration area.  Wear comfortable clothing and clothing appropriate for easy access to any Portacath or PICC line.   We strive to give you quality time with your provider. You may need to reschedule your appointment if you arrive late (15 or more minutes).  Arriving late affects you and other patients whose appointments are after yours.  Also, if you miss three or more appointments without notifying the office, you may be dismissed from the clinic at the provider's discretion.      For prescription refill requests, have your pharmacy contact our office and allow 72 hours for refills to be completed.    Today you received the following chemotherapy and/or immunotherapy agents Darzalex      To help prevent nausea and vomiting after your treatment, we encourage you to take your nausea medication as directed.  BELOW ARE SYMPTOMS THAT SHOULD BE REPORTED IMMEDIATELY: *FEVER GREATER THAN 100.4 F (38 C) OR HIGHER *CHILLS OR SWEATING *NAUSEA AND VOMITING THAT IS NOT CONTROLLED WITH YOUR NAUSEA MEDICATION *UNUSUAL SHORTNESS OF BREATH *UNUSUAL BRUISING OR BLEEDING *URINARY PROBLEMS (pain or burning when urinating, or frequent urination) *BOWEL PROBLEMS (unusual diarrhea, constipation, pain near the anus) TENDERNESS IN MOUTH AND THROAT WITH OR WITHOUT PRESENCE OF ULCERS (sore throat, sores in mouth, or a toothache) UNUSUAL RASH, SWELLING OR PAIN  UNUSUAL VAGINAL DISCHARGE OR ITCHING   Items with * indicate a potential emergency and should be followed up as soon as possible or go to the Emergency Department if any problems should occur.  Please show the CHEMOTHERAPY ALERT CARD or IMMUNOTHERAPY ALERT CARD at check-in  to the Emergency Department and triage nurse. Should you have questions after your visit or need to cancel or reschedule your appointment, please contact [facility]  [phone] and follow the prompts.  Office hours are 8:00 a.m. to 4:30 p.m. Monday - Friday. Please note that voicemails left after 4:00 p.m. may not be returned until the following business day.  We are closed weekends and major holidays. You have access to a nurse at all times for urgent questions. Please call the main number to the clinic [phone] and follow the prompts.  For any non-urgent questions, you may also contact your provider using MyChart. We now offer e-Visits for anyone 18 and older to request care online for non-urgent symptoms. For details visit [email or web address].   Also download the MyChart app! Go to the app store, search "MyChart", open the app, select [facility], and log in with your MyChart username and password.   

## 2022-02-11 LAB — KAPPA/LAMBDA LIGHT CHAINS
Kappa free light chain: 73 mg/L — ABNORMAL HIGH (ref 3.3–19.4)
Kappa, lambda light chain ratio: 3.12 — ABNORMAL HIGH (ref 0.26–1.65)
Lambda free light chains: 23.4 mg/L (ref 5.7–26.3)

## 2022-02-12 ENCOUNTER — Other Ambulatory Visit: Payer: Self-pay

## 2022-02-12 LAB — IGG, IGA, IGM
IgA: 231 mg/dL (ref 61–437)
IgG (Immunoglobin G), Serum: 730 mg/dL (ref 603–1613)
IgM (Immunoglobulin M), Srm: 32 mg/dL (ref 15–143)

## 2022-02-15 LAB — PROTEIN ELECTROPHORESIS, SERUM, WITH REFLEX
A/G Ratio: 1.4 (ref 0.7–1.7)
Albumin ELP: 3.6 g/dL (ref 2.9–4.4)
Alpha-1-Globulin: 0.2 g/dL (ref 0.0–0.4)
Alpha-2-Globulin: 0.6 g/dL (ref 0.4–1.0)
Beta Globulin: 0.9 g/dL (ref 0.7–1.3)
Gamma Globulin: 0.9 g/dL (ref 0.4–1.8)
Globulin, Total: 2.6 g/dL (ref 2.2–3.9)
Total Protein ELP: 6.2 g/dL (ref 6.0–8.5)

## 2022-02-16 ENCOUNTER — Telehealth: Payer: Self-pay

## 2022-02-16 NOTE — Telephone Encounter (Signed)
-----   Message from [name], MD sent at 02/15/2022  4:17 PM EST ----- Call and let him know that there is no obvious abnormal protein in the blood.  Thanks.  Laurey Arrow

## 2022-02-17 ENCOUNTER — Inpatient Hospital Stay: Payer: Medicare Other | Attending: Hematology & Oncology

## 2022-02-17 ENCOUNTER — Inpatient Hospital Stay: Payer: Medicare Other

## 2022-02-17 VITALS — BP 123/48 | HR 55 | Temp 97.8°F | Resp 16

## 2022-02-17 DIAGNOSIS — R519 Headache, unspecified: Secondary | ICD-10-CM | POA: Insufficient documentation

## 2022-02-17 DIAGNOSIS — M549 Dorsalgia, unspecified: Secondary | ICD-10-CM | POA: Diagnosis not present

## 2022-02-17 DIAGNOSIS — Z5112 Encounter for antineoplastic immunotherapy: Secondary | ICD-10-CM | POA: Insufficient documentation

## 2022-02-17 DIAGNOSIS — Z7961 Long term (current) use of immunomodulator: Secondary | ICD-10-CM | POA: Diagnosis not present

## 2022-02-17 DIAGNOSIS — R5383 Other fatigue: Secondary | ICD-10-CM | POA: Diagnosis not present

## 2022-02-17 DIAGNOSIS — G479 Sleep disorder, unspecified: Secondary | ICD-10-CM | POA: Insufficient documentation

## 2022-02-17 DIAGNOSIS — C9000 Multiple myeloma not having achieved remission: Secondary | ICD-10-CM | POA: Diagnosis present

## 2022-02-17 DIAGNOSIS — Z7982 Long term (current) use of aspirin: Secondary | ICD-10-CM | POA: Diagnosis not present

## 2022-02-17 DIAGNOSIS — R0602 Shortness of breath: Secondary | ICD-10-CM | POA: Insufficient documentation

## 2022-02-17 DIAGNOSIS — D472 Monoclonal gammopathy: Secondary | ICD-10-CM

## 2022-02-17 DIAGNOSIS — R0981 Nasal congestion: Secondary | ICD-10-CM | POA: Diagnosis not present

## 2022-02-17 DIAGNOSIS — M791 Myalgia, unspecified site: Secondary | ICD-10-CM | POA: Insufficient documentation

## 2022-02-17 DIAGNOSIS — Z79899 Other long term (current) drug therapy: Secondary | ICD-10-CM | POA: Diagnosis not present

## 2022-02-17 DIAGNOSIS — R002 Palpitations: Secondary | ICD-10-CM | POA: Diagnosis not present

## 2022-02-17 LAB — CBC WITH DIFFERENTIAL (CANCER CENTER ONLY)
Abs Immature Granulocytes: 0 10*3/uL (ref 0.00–0.07)
Basophils Absolute: 0.1 10*3/uL (ref 0.0–0.1)
Basophils Relative: 2 %
Eosinophils Absolute: 0.1 10*3/uL (ref 0.0–0.5)
Eosinophils Relative: 2 %
HCT: 34 % — ABNORMAL LOW (ref 39.0–52.0)
Hemoglobin: 10.8 g/dL — ABNORMAL LOW (ref 13.0–17.0)
Immature Granulocytes: 0 %
Lymphocytes Relative: 32 %
Lymphs Abs: 0.9 10*3/uL (ref 0.7–4.0)
MCH: 29.4 pg (ref 26.0–34.0)
MCHC: 31.8 g/dL (ref 30.0–36.0)
MCV: 92.6 fL (ref 80.0–100.0)
Monocytes Absolute: 1.1 10*3/uL — ABNORMAL HIGH (ref 0.1–1.0)
Monocytes Relative: 37 %
Neutro Abs: 0.8 10*3/uL — ABNORMAL LOW (ref 1.7–7.7)
Neutrophils Relative %: 27 %
Platelet Count: 126 10*3/uL — ABNORMAL LOW (ref 150–400)
RBC: 3.67 MIL/uL — ABNORMAL LOW (ref 4.22–5.81)
RDW: 16.1 % — ABNORMAL HIGH (ref 11.5–15.5)
WBC Count: 2.9 10*3/uL — ABNORMAL LOW (ref 4.0–10.5)
nRBC: 0 % (ref 0.0–0.2)

## 2022-02-17 MED ORDER — ACETAMINOPHEN 325 MG PO TABS: 650.0000 mg | ORAL_TABLET | Freq: Once | ORAL | Status: DC

## 2022-02-17 MED ORDER — DEXAMETHASONE 4 MG PO TABS: 20.0000 mg | ORAL_TABLET | Freq: Once | ORAL | Status: DC

## 2022-02-17 MED ORDER — DIPHENHYDRAMINE HCL 25 MG PO CAPS: 50.0000 mg | ORAL_CAPSULE | Freq: Once | ORAL | Status: DC

## 2022-02-17 MED ORDER — DARATUMUMAB-HYALURONIDASE-FIHJ 1800-30000 MG-UT/15ML ~~LOC~~ SOLN
1800.0000 mg | Freq: Once | SUBCUTANEOUS | Status: AC
  Administered 2022-02-17: 1800 mg via SUBCUTANEOUS
  Filled 2022-02-17: qty 15

## 2022-02-17 NOTE — Progress Notes (Signed)
Ok to treat with ANC of 0.8 per Dr Marin Olp. dph

## 2022-02-17 NOTE — Patient Instructions (Signed)
[facility]  Discharge Instructions: Thank you for choosing [facility] to provide your oncology and hematology care.   If you have a lab appointment with the Cancer Center, please go directly to the Cancer Center and check in at the registration area.  Wear comfortable clothing and clothing appropriate for easy access to any Portacath or PICC line.   We strive to give you quality time with your provider. You may need to reschedule your appointment if you arrive late (15 or more minutes).  Arriving late affects you and other patients whose appointments are after yours.  Also, if you miss three or more appointments without notifying the office, you may be dismissed from the clinic at the provider's discretion.      For prescription refill requests, have your pharmacy contact our office and allow 72 hours for refills to be completed.    Today you received the following chemotherapy and/or immunotherapy agents Darzalex      To help prevent nausea and vomiting after your treatment, we encourage you to take your nausea medication as directed.  BELOW ARE SYMPTOMS THAT SHOULD BE REPORTED IMMEDIATELY: *FEVER GREATER THAN 100.4 F (38 C) OR HIGHER *CHILLS OR SWEATING *NAUSEA AND VOMITING THAT IS NOT CONTROLLED WITH YOUR NAUSEA MEDICATION *UNUSUAL SHORTNESS OF BREATH *UNUSUAL BRUISING OR BLEEDING *URINARY PROBLEMS (pain or burning when urinating, or frequent urination) *BOWEL PROBLEMS (unusual diarrhea, constipation, pain near the anus) TENDERNESS IN MOUTH AND THROAT WITH OR WITHOUT PRESENCE OF ULCERS (sore throat, sores in mouth, or a toothache) UNUSUAL RASH, SWELLING OR PAIN  UNUSUAL VAGINAL DISCHARGE OR ITCHING   Items with * indicate a potential emergency and should be followed up as soon as possible or go to the Emergency Department if any problems should occur.  Please show the CHEMOTHERAPY ALERT CARD or IMMUNOTHERAPY ALERT CARD at check-in  to the Emergency Department and triage nurse. Should you have questions after your visit or need to cancel or reschedule your appointment, please contact [facility]  [phone] and follow the prompts.  Office hours are 8:00 a.m. to 4:30 p.m. Monday - Friday. Please note that voicemails left after 4:00 p.m. may not be returned until the following business day.  We are closed weekends and major holidays. You have access to a nurse at all times for urgent questions. Please call the main number to the clinic [phone] and follow the prompts.  For any non-urgent questions, you may also contact your provider using MyChart. We now offer e-Visits for anyone 18 and older to request care online for non-urgent symptoms. For details visit [email or web address].   Also download the MyChart app! Go to the app store, search "MyChart", open the app, select [facility], and log in with your MyChart username and password.   

## 2022-02-24 ENCOUNTER — Inpatient Hospital Stay: Payer: Medicare Other

## 2022-02-24 ENCOUNTER — Inpatient Hospital Stay (HOSPITAL_BASED_OUTPATIENT_CLINIC_OR_DEPARTMENT_OTHER): Payer: Medicare Other

## 2022-02-24 ENCOUNTER — Encounter: Payer: Self-pay | Admitting: Hematology & Oncology

## 2022-02-24 ENCOUNTER — Other Ambulatory Visit: Payer: Self-pay

## 2022-02-24 ENCOUNTER — Inpatient Hospital Stay (HOSPITAL_BASED_OUTPATIENT_CLINIC_OR_DEPARTMENT_OTHER): Payer: Medicare Other | Admitting: Hematology & Oncology

## 2022-02-24 VITALS — BP 129/70 | HR 67 | Resp 18

## 2022-02-24 VITALS — BP 144/50 | HR 62 | Temp 97.9°F | Resp 18 | Ht 74.0 in | Wt 171.1 lb

## 2022-02-24 DIAGNOSIS — Z5112 Encounter for antineoplastic immunotherapy: Secondary | ICD-10-CM | POA: Diagnosis not present

## 2022-02-24 DIAGNOSIS — D472 Monoclonal gammopathy: Secondary | ICD-10-CM

## 2022-02-24 LAB — CBC WITH DIFFERENTIAL (CANCER CENTER ONLY)
Abs Immature Granulocytes: 0.02 10*3/uL (ref 0.00–0.07)
Basophils Absolute: 0.1 10*3/uL (ref 0.0–0.1)
Basophils Relative: 2 %
Eosinophils Absolute: 0 10*3/uL (ref 0.0–0.5)
Eosinophils Relative: 1 %
HCT: 34.9 % — ABNORMAL LOW (ref 39.0–52.0)
Hemoglobin: 11.1 g/dL — ABNORMAL LOW (ref 13.0–17.0)
Immature Granulocytes: 0 %
Lymphocytes Relative: 13 %
Lymphs Abs: 0.6 10*3/uL — ABNORMAL LOW (ref 0.7–4.0)
MCH: 29.6 pg (ref 26.0–34.0)
MCHC: 31.8 g/dL (ref 30.0–36.0)
MCV: 93.1 fL (ref 80.0–100.0)
Monocytes Absolute: 0.2 10*3/uL (ref 0.1–1.0)
Monocytes Relative: 4 %
Neutro Abs: 3.6 10*3/uL (ref 1.7–7.7)
Neutrophils Relative %: 80 %
Platelet Count: 187 10*3/uL (ref 150–400)
RBC: 3.75 MIL/uL — ABNORMAL LOW (ref 4.22–5.81)
RDW: 16.2 % — ABNORMAL HIGH (ref 11.5–15.5)
WBC Count: 4.5 10*3/uL (ref 4.0–10.5)
nRBC: 0 % (ref 0.0–0.2)

## 2022-02-24 LAB — CMP (CANCER CENTER ONLY)
ALT: 14 U/L (ref 0–44)
AST: 12 U/L — ABNORMAL LOW (ref 15–41)
Albumin: 4 g/dL (ref 3.5–5.0)
Alkaline Phosphatase: 108 U/L (ref 38–126)
Anion gap: 7 (ref 5–15)
BUN: 20 mg/dL (ref 8–23)
CO2: 29 mmol/L (ref 22–32)
Calcium: 9.2 mg/dL (ref 8.9–10.3)
Chloride: 104 mmol/L (ref 98–111)
Creatinine: 1.45 mg/dL — ABNORMAL HIGH (ref 0.61–1.24)
GFR, Estimated: 48 mL/min — ABNORMAL LOW (ref >60)
Glucose, Bld: 166 mg/dL — ABNORMAL HIGH (ref 70–99)
Potassium: 4.4 mmol/L (ref 3.5–5.1)
Sodium: 140 mmol/L (ref 135–145)
Total Bilirubin: 0.5 mg/dL (ref 0.3–1.2)
Total Protein: 6.7 g/dL (ref 6.5–8.1)

## 2022-02-24 LAB — LACTATE DEHYDROGENASE: LDH: 109 U/L (ref 98–192)

## 2022-02-24 MED ORDER — DEXAMETHASONE 4 MG PO TABS: 20.0000 mg | ORAL_TABLET | Freq: Once | ORAL | Status: DC

## 2022-02-24 MED ORDER — ACETAMINOPHEN 325 MG PO TABS: 650.0000 mg | ORAL_TABLET | Freq: Once | ORAL | Status: DC

## 2022-02-24 MED ORDER — DARATUMUMAB-HYALURONIDASE-FIHJ 1800-30000 MG-UT/15ML ~~LOC~~ SOLN
1800.0000 mg | Freq: Once | SUBCUTANEOUS | Status: AC
  Administered 2022-02-24: 1800 mg via SUBCUTANEOUS
  Filled 2022-02-24: qty 15

## 2022-02-24 MED ORDER — TRAZODONE HCL 50 MG PO TABS: 50.0000 mg | ORAL_TABLET | Freq: Every day | ORAL | 3 refills | Status: AC

## 2022-02-24 MED ORDER — DIPHENHYDRAMINE HCL 25 MG PO CAPS: 50.0000 mg | ORAL_CAPSULE | Freq: Once | ORAL | Status: DC

## 2022-02-24 MED ORDER — BORTEZOMIB CHEMO SQ INJECTION 3.5 MG (2.5MG/ML)
1.3000 mg/m2 | Freq: Once | INTRAMUSCULAR | Status: AC
  Administered 2022-02-24: 2.5 mg via SUBCUTANEOUS
  Filled 2022-02-24: qty 1

## 2022-02-24 NOTE — Progress Notes (Signed)
Hematology and Oncology Follow Up Visit  James Marks:[id number] June 30, 1940 82 y.o. 02/24/2022   Principle Diagnosis:  IgA kappa  myeloma  Current Therapy:   Velcade/Revlimid/Decadron -- s/p cycle #6 -- start on 07/27/2021 Faspro/Velcade/Revlimid/Decadron -- s/p cycle #2 - start  on 01/28/2022 --Revlimid  DC'd on 02/24/2022     Interim History:  James Marks is back for follow-up.  He states he is doing okay.  He still has a lot of fatigue.  I think this might be from the Revlimid.  I think we should probably stop the Revlimid.  However he is doing quite well.  His light chains have come down quite nicely.  The Kappa light chain went down from I think 160 down to 79 mg/L.  There was no monoclonal light chain noted on his serum studies.  He is not sleeping well.  I think this might be from the Decadron.  I am going to call in some trazodone (50 mg p.o. nightly as needed) lets see if this may help.  He has had no bleeding.  He has little bit of a headache.  He has had no visual changes.  He has mostly the headache at nighttime.  I told him if he wanted to take Motrin or Aleve, he must take it with food.  He has had no change in bowel or bladder habits.  He has had no fever.  He has had no rashes.  He has had no leg swelling.  Overall, I would say that his performance status is probably ECOG 1.   Medications:  Current Outpatient Medications:    acetaminophen (TYLENOL) 325 MG tablet, Take 650 mg by mouth. 02/10/2022 Takes 650 mg one hour prior to antibody treatment., Disp: , Rfl:    acyclovir (ZOVIRAX) 400 MG tablet, TAKE 1 TABLET(400 MG) BY MOUTH TWICE DAILY, Disp: 60 tablet, Rfl: 3   amLODipine (NORVASC) 2.5 MG tablet, Take 1 tablet (2.5 mg total) by mouth daily., Disp: 90 tablet, Rfl: 3   aspirin 325 MG tablet, Take 325 mg by mouth daily., Disp: , Rfl:    dexamethasone (DECADRON) 4 MG tablet, Take 5 tablets (20 mg) 1 hour prior to chemo treatment., Disp: 20 tablet, Rfl: 1    diphenhydrAMINE (BENADRYL) 25 mg capsule, Take 50 mg by mouth. 02/10/2022 Takes one hour prior to antibody treatment., Disp: , Rfl:    gabapentin (NEURONTIN) 300 MG capsule, TAKE ONE CAPSULE BY MOUTH DAILY WITH BREAKFAST, 1 CAPSULE WITH DINNER AND 2 CAPSULES AT BEDTIME, Disp: 360 capsule, Rfl: 4   lidocaine-prilocaine (EMLA) cream, Apply to affected area once, Disp: 30 g, Rfl: 3   losartan (COZAAR) 50 MG tablet, TAKE 1 TABLET BY MOUTH DAILY, Disp: 90 tablet, Rfl: 3   montelukast (SINGULAIR) 10 MG tablet, Take 1 tablet (10 mg total) by mouth at bedtime., Disp: 30 tablet, Rfl: 2   Multiple Vitamins-Minerals (PRESERVISION AREDS 2 PO), Take by mouth daily at 6 (six) AM., Disp: , Rfl:    traZODone (DESYREL) 50 MG tablet, Take 1 tablet (50 mg total) by mouth at bedtime., Disp: 30 tablet, Rfl: 3   amoxicillin (AMOXIL) 500 MG capsule, Take four tablets (2000 mg) total one hour prior to dental procedure. (Patient not taking: Reported on 02/10/2022), Disp: 4 capsule, Rfl: 0   ondansetron (ZOFRAN) 8 MG tablet, Take 1 tablet (8 mg total) by mouth every 8 (eight) hours as needed for nausea or vomiting. (Patient not taking: Reported on 02/10/2022), Disp: 30 tablet, Rfl: 1  prochlorperazine (COMPAZINE) 10 MG tablet, Take 1 tablet (10 mg total) by mouth every 6 (six) hours as needed for nausea or vomiting. (Patient not taking: Reported on 02/10/2022), Disp: 30 tablet, Rfl: 1 No current facility-administered medications for this visit.  Facility-Administered Medications Ordered in Other Visits:    0.9 %  sodium chloride infusion, , Intravenous, Continuous, Deryl Giroux, Rudell Cobb, MD, Stopped at 08/05/14 1710  Allergies: No Known Allergies  Past Medical History, Surgical history, Social history, and Family History were reviewed and updated.  Review of Systems: Review of Systems  Constitutional:  Positive for malaise/fatigue.  HENT:  Positive for congestion.   Eyes: Negative.   Respiratory:  Positive for shortness of  breath.   Cardiovascular:  Positive for palpitations.  Gastrointestinal: Negative.   Genitourinary: Negative.   Musculoskeletal:  Positive for back pain and myalgias.  Skin: Negative.   Neurological:  Positive for tingling.  Endo/Heme/Allergies: Negative.   Psychiatric/Behavioral: Negative.      Physical Exam:  height is 6' 2"$  (1.88 m) and weight is 171 lb 1.3 oz (77.6 kg). His oral temperature is 97.9 F (36.6 C). His blood pressure is 144/50 (abnormal) and his pulse is 62. His respiration is 18 and oxygen saturation is 100%.   Wt Readings from Last 3 Encounters:  02/24/22 171 lb 1.3 oz (77.6 kg)  02/10/22 171 lb 6.4 oz (77.7 kg)  01/14/22 171 lb 6.4 oz (77.7 kg)     Physical Exam Vitals reviewed.  HENT:     Head: Normocephalic and atraumatic.  Eyes:     Pupils: Pupils are equal, round, and reactive to light.  Cardiovascular:     Rate and Rhythm: Normal rate and regular rhythm.     Heart sounds: Normal heart sounds.  Pulmonary:     Effort: Pulmonary effort is normal.     Breath sounds: Normal breath sounds.  Abdominal:     General: Bowel sounds are normal.     Palpations: Abdomen is soft.  Musculoskeletal:        General: No tenderness or deformity. Normal range of motion.     Cervical back: Normal range of motion.  Lymphadenopathy:     Cervical: No cervical adenopathy.  Skin:    General: Skin is warm and dry.     Findings: No erythema or rash.  Neurological:     Mental Status: He is alert and oriented to person, place, and time.  Psychiatric:        Behavior: Behavior normal.        Thought Content: Thought content normal.        Judgment: Judgment normal.      Lab Results  Component Value Date   WBC 4.5 02/24/2022   HGB 11.1 (L) 02/24/2022   HCT 34.9 (L) 02/24/2022   MCV 93.1 02/24/2022   PLT 187 02/24/2022     Chemistry      Component Value Date/Time   NA 140 02/24/2022 1056   NA 143 12/06/2016 1156   NA 139 08/02/2016 1146   K 4.4 02/24/2022  1056   K 4.2 12/06/2016 1156   K 4.7 08/02/2016 1146   CL 104 02/24/2022 1056   CL 107 12/06/2016 1156   CO2 29 02/24/2022 1056   CO2 28 12/06/2016 1156   CO2 24 08/02/2016 1146   BUN 20 02/24/2022 1056   BUN 29 (H) 12/06/2016 1156   BUN 34.3 (H) 08/02/2016 1146   CREATININE 1.45 (H) 02/24/2022 1056   CREATININE 2.1 (H)  12/06/2016 1156   CREATININE 1.7 (H) 08/02/2016 1146      Component Value Date/Time   CALCIUM 9.2 02/24/2022 1056   CALCIUM 9.1 12/06/2016 1156   CALCIUM 9.5 08/02/2016 1146   ALKPHOS 108 02/24/2022 1056   ALKPHOS 108 (H) 12/06/2016 1156   ALKPHOS 115 08/02/2016 1146   AST 12 (L) 02/24/2022 1056   AST 14 08/02/2016 1146   ALT 14 02/24/2022 1056   ALT 18 12/06/2016 1156   ALT 9 08/02/2016 1146   BILITOT 0.5 02/24/2022 1056   BILITOT 0.41 08/02/2016 1146      Impression and Plan:  James Marks is 82 year old man with IgA kappa myeloma.  He is on fairly aggressive therapy right now.  We had added the Faspro.  I think adding the Faspro clearly helped him out.  I think because we are out of the [place], we can stop the Revlimid and see how this makes him feel.  We will restart the Velcade now.  I do want to do a 24-hour urine urine on him again.  We will plan to get him back in another month.  Hopefully, if we see that he is responding nicely, we can adjust his protocol so he does not have to come back as often.    [name], MD 2/14/202412:04 PM

## 2022-02-24 NOTE — Patient Instructions (Signed)
[facility] [place]  Discharge Instructions: Thank you for choosing [facility] to provide your oncology and hematology care.   If you have a lab appointment with the [facility], please go directly to the [facility] and check in at the registration area.  Wear comfortable clothing and clothing appropriate for easy access to any Portacath or PICC line.   We strive to give you quality time with your provider. You may need to reschedule your appointment if you arrive late (15 or more minutes).  Arriving late affects you and other patients whose appointments are after yours.  Also, if you miss three or more appointments without notifying the office, you may be dismissed from the clinic at the provider's discretion.      For prescription refill requests, have your pharmacy contact our office and allow 72 hours for refills to be completed.    Today you received the following chemotherapy and/or immunotherapy agents:  Darzalex Faspro/Velcade      To help prevent nausea and vomiting after your treatment, we encourage you to take your nausea medication as directed.  BELOW ARE SYMPTOMS THAT SHOULD BE REPORTED IMMEDIATELY: *FEVER GREATER THAN 100.4 F (38 C) OR HIGHER *CHILLS OR SWEATING *NAUSEA AND VOMITING THAT IS NOT CONTROLLED WITH YOUR NAUSEA MEDICATION *UNUSUAL SHORTNESS OF BREATH *UNUSUAL BRUISING OR BLEEDING *URINARY PROBLEMS (pain or burning when urinating, or frequent urination) *BOWEL PROBLEMS (unusual diarrhea, constipation, pain near the anus) TENDERNESS IN MOUTH AND THROAT WITH OR WITHOUT PRESENCE OF ULCERS (sore throat, sores in mouth, or a toothache) UNUSUAL RASH, SWELLING OR PAIN  UNUSUAL VAGINAL DISCHARGE OR ITCHING   Items with * indicate a potential emergency and should be followed up as soon as possible or go to the Emergency Department if any problems should occur.  Please show the CHEMOTHERAPY ALERT CARD or IMMUNOTHERAPY ALERT  CARD at check-in to the Emergency Department and triage nurse. Should you have questions after your visit or need to cancel or reschedule your appointment, please contact [facility]  [phone] and follow the prompts.  Office hours are 8:00 a.m. to 4:30 p.m. Monday - Friday. Please note that voicemails left after 4:00 p.m. may not be returned until the following business day.  We are closed weekends and major holidays. You have access to a nurse at all times for urgent questions. Please call the main number to the clinic [phone] and follow the prompts.  For any non-urgent questions, you may also contact your provider using MyChart. We now offer e-Visits for anyone 4 and older to request care online for non-urgent symptoms. For details visit [email or web address].   Also download the MyChart app! Go to the app store, search "MyChart", open the app, select [facility], and log in with your MyChart username and password.

## 2022-02-25 ENCOUNTER — Other Ambulatory Visit: Payer: Self-pay

## 2022-02-25 ENCOUNTER — Encounter: Payer: Self-pay | Admitting: *Deleted

## 2022-02-25 LAB — KAPPA/LAMBDA LIGHT CHAINS
Kappa free light chain: 41.9 mg/L — ABNORMAL HIGH (ref 3.3–19.4)
Kappa, lambda light chain ratio: 2.46 — ABNORMAL HIGH (ref 0.26–1.65)
Lambda free light chains: 17 mg/L (ref 5.7–26.3)

## 2022-02-26 LAB — IGG, IGA, IGM
IgA: 187 mg/dL (ref 61–437)
IgG (Immunoglobin G), Serum: 828 mg/dL (ref 603–1613)
IgM (Immunoglobulin M), Srm: 33 mg/dL (ref 15–143)

## 2022-03-03 ENCOUNTER — Inpatient Hospital Stay: Payer: Medicare Other

## 2022-03-03 VITALS — BP 146/63 | HR 55 | Temp 98.2°F | Resp 18

## 2022-03-03 DIAGNOSIS — Z5112 Encounter for antineoplastic immunotherapy: Secondary | ICD-10-CM | POA: Diagnosis not present

## 2022-03-03 DIAGNOSIS — D472 Monoclonal gammopathy: Secondary | ICD-10-CM

## 2022-03-03 LAB — CBC WITH DIFFERENTIAL (CANCER CENTER ONLY)
Abs Immature Granulocytes: 0.01 10*3/uL (ref 0.00–0.07)
Basophils Absolute: 0 10*3/uL (ref 0.0–0.1)
Basophils Relative: 1 %
Eosinophils Absolute: 0 10*3/uL (ref 0.0–0.5)
Eosinophils Relative: 1 %
HCT: 35.1 % — ABNORMAL LOW (ref 39.0–52.0)
Hemoglobin: 11.1 g/dL — ABNORMAL LOW (ref 13.0–17.0)
Immature Granulocytes: 0 %
Lymphocytes Relative: 26 %
Lymphs Abs: 1.2 10*3/uL (ref 0.7–4.0)
MCH: 29.2 pg (ref 26.0–34.0)
MCHC: 31.6 g/dL (ref 30.0–36.0)
MCV: 92.4 fL (ref 80.0–100.0)
Monocytes Absolute: 0.5 10*3/uL (ref 0.1–1.0)
Monocytes Relative: 11 %
Neutro Abs: 2.7 10*3/uL (ref 1.7–7.7)
Neutrophils Relative %: 61 %
Platelet Count: 147 10*3/uL — ABNORMAL LOW (ref 150–400)
RBC: 3.8 MIL/uL — ABNORMAL LOW (ref 4.22–5.81)
RDW: 16 % — ABNORMAL HIGH (ref 11.5–15.5)
WBC Count: 4.4 10*3/uL (ref 4.0–10.5)
nRBC: 0 % (ref 0.0–0.2)

## 2022-03-03 LAB — CMP (CANCER CENTER ONLY)
ALT: 11 U/L (ref 0–44)
AST: 13 U/L — ABNORMAL LOW (ref 15–41)
Albumin: 4.1 g/dL (ref 3.5–5.0)
Alkaline Phosphatase: 112 U/L (ref 38–126)
Anion gap: 7 (ref 5–15)
BUN: 24 mg/dL — ABNORMAL HIGH (ref 8–23)
CO2: 28 mmol/L (ref 22–32)
Calcium: 9.2 mg/dL (ref 8.9–10.3)
Chloride: 105 mmol/L (ref 98–111)
Creatinine: 1.47 mg/dL — ABNORMAL HIGH (ref 0.61–1.24)
GFR, Estimated: 47 mL/min — ABNORMAL LOW (ref >60)
Glucose, Bld: 163 mg/dL — ABNORMAL HIGH (ref 70–99)
Potassium: 4.6 mmol/L (ref 3.5–5.1)
Sodium: 140 mmol/L (ref 135–145)
Total Bilirubin: 0.6 mg/dL (ref 0.3–1.2)
Total Protein: 6.3 g/dL — ABNORMAL LOW (ref 6.5–8.1)

## 2022-03-03 MED ORDER — DIPHENHYDRAMINE HCL 25 MG PO CAPS: 50.0000 mg | ORAL_CAPSULE | Freq: Once | ORAL | Status: DC

## 2022-03-03 MED ORDER — ACETAMINOPHEN 325 MG PO TABS: 650.0000 mg | ORAL_TABLET | Freq: Once | ORAL | Status: DC

## 2022-03-03 MED ORDER — BORTEZOMIB CHEMO SQ INJECTION 3.5 MG (2.5MG/ML)
1.3000 mg/m2 | Freq: Once | INTRAMUSCULAR | Status: AC
  Administered 2022-03-03: 2.5 mg via SUBCUTANEOUS
  Filled 2022-03-03: qty 1

## 2022-03-03 MED ORDER — DARATUMUMAB-HYALURONIDASE-FIHJ 1800-30000 MG-UT/15ML ~~LOC~~ SOLN
1800.0000 mg | Freq: Once | SUBCUTANEOUS | Status: AC
  Administered 2022-03-03: 1800 mg via SUBCUTANEOUS
  Filled 2022-03-03: qty 15

## 2022-03-03 MED ORDER — DEXAMETHASONE 4 MG PO TABS: 20.0000 mg | ORAL_TABLET | Freq: Once | ORAL | Status: DC

## 2022-03-03 NOTE — Patient Instructions (Signed)
[facility] [place]  Discharge Instructions: Thank you for choosing [facility] to provide your oncology and hematology care.   If you have a lab appointment with the [facility], please go directly to the [facility] and check in at the registration area.  Wear comfortable clothing and clothing appropriate for easy access to any Portacath or PICC line.   We strive to give you quality time with your provider. You may need to reschedule your appointment if you arrive late (15 or more minutes).  Arriving late affects you and other patients whose appointments are after yours.  Also, if you miss three or more appointments without notifying the office, you may be dismissed from the clinic at the provider's discretion.      For prescription refill requests, have your pharmacy contact our office and allow 72 hours for refills to be completed.    Today you received the following chemotherapy and/or immunotherapy agents Velcade, Darzalex      To help prevent nausea and vomiting after your treatment, we encourage you to take your nausea medication as directed.  BELOW ARE SYMPTOMS THAT SHOULD BE REPORTED IMMEDIATELY: *FEVER GREATER THAN 100.4 F (38 C) OR HIGHER *CHILLS OR SWEATING *NAUSEA AND VOMITING THAT IS NOT CONTROLLED WITH YOUR NAUSEA MEDICATION *UNUSUAL SHORTNESS OF BREATH *UNUSUAL BRUISING OR BLEEDING *URINARY PROBLEMS (pain or burning when urinating, or frequent urination) *BOWEL PROBLEMS (unusual diarrhea, constipation, pain near the anus) TENDERNESS IN MOUTH AND THROAT WITH OR WITHOUT PRESENCE OF ULCERS (sore throat, sores in mouth, or a toothache) UNUSUAL RASH, SWELLING OR PAIN  UNUSUAL VAGINAL DISCHARGE OR ITCHING   Items with * indicate a potential emergency and should be followed up as soon as possible or go to the Emergency Department if any problems should occur.  Please show the CHEMOTHERAPY ALERT CARD or IMMUNOTHERAPY ALERT CARD at  check-in to the Emergency Department and triage nurse. Should you have questions after your visit or need to cancel or reschedule your appointment, please contact [facility]  [phone] and follow the prompts.  Office hours are 8:00 a.m. to 4:30 p.m. Monday - Friday. Please note that voicemails left after 4:00 p.m. may not be returned until the following business day.  We are closed weekends and major holidays. You have access to a nurse at all times for urgent questions. Please call the main number to the clinic [phone] and follow the prompts.  For any non-urgent questions, you may also contact your provider using MyChart. We now offer e-Visits for anyone 62 and older to request care online for non-urgent symptoms. For details visit [email or web address].   Also download the MyChart app! Go to the app store, search "MyChart", open the app, select [facility], and log in with your MyChart username and password.

## 2022-03-04 ENCOUNTER — Telehealth: Payer: Self-pay

## 2022-03-04 LAB — PROTEIN ELECTROPHORESIS, SERUM, WITH REFLEX
A/G Ratio: 1.4 (ref 0.7–1.7)
Albumin ELP: 3.5 g/dL (ref 2.9–4.4)
Alpha-1-Globulin: 0.3 g/dL (ref 0.0–0.4)
Alpha-2-Globulin: 0.7 g/dL (ref 0.4–1.0)
Beta Globulin: 0.8 g/dL (ref 0.7–1.3)
Gamma Globulin: 0.7 g/dL (ref 0.4–1.8)
Globulin, Total: 2.5 g/dL (ref 2.2–3.9)
M-Spike, %: 0.1 g/dL — ABNORMAL HIGH
SPEP Interpretation: 0
Total Protein ELP: 6 g/dL (ref 6.0–8.5)

## 2022-03-04 LAB — IMMUNOFIXATION REFLEX, SERUM
IgA: 197 mg/dL (ref 61–437)
IgG (Immunoglobin G), Serum: 822 mg/dL (ref 603–1613)
IgM (Immunoglobulin M), Srm: 38 mg/dL (ref 15–143)

## 2022-03-04 NOTE — Telephone Encounter (Signed)
Advised via MyChart.

## 2022-03-04 NOTE — Telephone Encounter (Signed)
-----   Message from [name], MD sent at 03/04/2022  4:15 PM EST ----- Call and let him know that the abnormal protein is still doing very well.  Is still incredibly low level.  Thanks.  Laurey Arrow

## 2022-03-10 ENCOUNTER — Inpatient Hospital Stay: Payer: Medicare Other

## 2022-03-10 VITALS — BP 119/62 | HR 59 | Temp 98.0°F | Resp 18

## 2022-03-10 DIAGNOSIS — D472 Monoclonal gammopathy: Secondary | ICD-10-CM

## 2022-03-10 DIAGNOSIS — Z5112 Encounter for antineoplastic immunotherapy: Secondary | ICD-10-CM | POA: Diagnosis not present

## 2022-03-10 LAB — CBC WITH DIFFERENTIAL (CANCER CENTER ONLY)
Abs Immature Granulocytes: 0.03 10*3/uL (ref 0.00–0.07)
Basophils Absolute: 0 10*3/uL (ref 0.0–0.1)
Basophils Relative: 0 %
Eosinophils Absolute: 0.1 10*3/uL (ref 0.0–0.5)
Eosinophils Relative: 1 %
HCT: 36.2 % — ABNORMAL LOW (ref 39.0–52.0)
Hemoglobin: 11.4 g/dL — ABNORMAL LOW (ref 13.0–17.0)
Immature Granulocytes: 1 %
Lymphocytes Relative: 23 %
Lymphs Abs: 1.3 10*3/uL (ref 0.7–4.0)
MCH: 29.5 pg (ref 26.0–34.0)
MCHC: 31.5 g/dL (ref 30.0–36.0)
MCV: 93.8 fL (ref 80.0–100.0)
Monocytes Absolute: 0.8 10*3/uL (ref 0.1–1.0)
Monocytes Relative: 13 %
Neutro Abs: 3.6 10*3/uL (ref 1.7–7.7)
Neutrophils Relative %: 62 %
Platelet Count: 133 10*3/uL — ABNORMAL LOW (ref 150–400)
RBC: 3.86 MIL/uL — ABNORMAL LOW (ref 4.22–5.81)
RDW: 16.2 % — ABNORMAL HIGH (ref 11.5–15.5)
WBC Count: 5.8 10*3/uL (ref 4.0–10.5)
nRBC: 0 % (ref 0.0–0.2)

## 2022-03-10 LAB — CMP (CANCER CENTER ONLY)
ALT: 12 U/L (ref 0–44)
AST: 12 U/L — ABNORMAL LOW (ref 15–41)
Albumin: 4.1 g/dL (ref 3.5–5.0)
Alkaline Phosphatase: 105 U/L (ref 38–126)
Anion gap: 7 (ref 5–15)
BUN: 18 mg/dL (ref 8–23)
CO2: 30 mmol/L (ref 22–32)
Calcium: 9.5 mg/dL (ref 8.9–10.3)
Chloride: 103 mmol/L (ref 98–111)
Creatinine: 1.52 mg/dL — ABNORMAL HIGH (ref 0.61–1.24)
GFR, Estimated: 45 mL/min — ABNORMAL LOW (ref >60)
Glucose, Bld: 150 mg/dL — ABNORMAL HIGH (ref 70–99)
Potassium: 5.2 mmol/L — ABNORMAL HIGH (ref 3.5–5.1)
Sodium: 140 mmol/L (ref 135–145)
Total Bilirubin: 0.5 mg/dL (ref 0.3–1.2)
Total Protein: 6.4 g/dL — ABNORMAL LOW (ref 6.5–8.1)

## 2022-03-10 MED ORDER — DIPHENHYDRAMINE HCL 25 MG PO CAPS: 50.0000 mg | ORAL_CAPSULE | Freq: Once | ORAL | Status: DC

## 2022-03-10 MED ORDER — DEXAMETHASONE 4 MG PO TABS: 20.0000 mg | ORAL_TABLET | Freq: Once | ORAL | Status: DC

## 2022-03-10 MED ORDER — BORTEZOMIB CHEMO SQ INJECTION 3.5 MG (2.5MG/ML)
1.3000 mg/m2 | Freq: Once | INTRAMUSCULAR | Status: AC
  Administered 2022-03-10: 2.5 mg via SUBCUTANEOUS
  Filled 2022-03-10: qty 1

## 2022-03-10 MED ORDER — ACETAMINOPHEN 325 MG PO TABS: 650.0000 mg | ORAL_TABLET | Freq: Once | ORAL | Status: DC

## 2022-03-10 MED ORDER — DARATUMUMAB-HYALURONIDASE-FIHJ 1800-30000 MG-UT/15ML ~~LOC~~ SOLN
1800.0000 mg | Freq: Once | SUBCUTANEOUS | Status: AC
  Administered 2022-03-10: 1800 mg via SUBCUTANEOUS
  Filled 2022-03-10: qty 15

## 2022-03-10 NOTE — Progress Notes (Signed)
Ok to treat with creat-1.52 per order of Dr. Marin Olp.

## 2022-03-10 NOTE — Patient Instructions (Signed)
[facility] [place]  Discharge Instructions: Thank you for choosing [facility] to provide your oncology and hematology care.   If you have a lab appointment with the [facility], please go directly to the [facility] and check in at the registration area.  Wear comfortable clothing and clothing appropriate for easy access to any Portacath or PICC line.   We strive to give you quality time with your provider. You may need to reschedule your appointment if you arrive late (15 or more minutes).  Arriving late affects you and other patients whose appointments are after yours.  Also, if you miss three or more appointments without notifying the office, you may be dismissed from the clinic at the provider's discretion.      For prescription refill requests, have your pharmacy contact our office and allow 72 hours for refills to be completed.    Today you received the following chemotherapy and/or immunotherapy agents Darzalex, Velcade.   To help prevent nausea and vomiting after your treatment, we encourage you to take your nausea medication as directed.  BELOW ARE SYMPTOMS THAT SHOULD BE REPORTED IMMEDIATELY: *FEVER GREATER THAN 100.4 F (38 C) OR HIGHER *CHILLS OR SWEATING *NAUSEA AND VOMITING THAT IS NOT CONTROLLED WITH YOUR NAUSEA MEDICATION *UNUSUAL SHORTNESS OF BREATH *UNUSUAL BRUISING OR BLEEDING *URINARY PROBLEMS (pain or burning when urinating, or frequent urination) *BOWEL PROBLEMS (unusual diarrhea, constipation, pain near the anus) TENDERNESS IN MOUTH AND THROAT WITH OR WITHOUT PRESENCE OF ULCERS (sore throat, sores in mouth, or a toothache) UNUSUAL RASH, SWELLING OR PAIN  UNUSUAL VAGINAL DISCHARGE OR ITCHING   Items with * indicate a potential emergency and should be followed up as soon as possible or go to the Emergency Department if any problems should occur.  Please show the CHEMOTHERAPY ALERT CARD or IMMUNOTHERAPY ALERT CARD at  check-in to the Emergency Department and triage nurse. Should you have questions after your visit or need to cancel or reschedule your appointment, please contact [facility]  [phone] and follow the prompts.  Office hours are 8:00 a.m. to 4:30 p.m. Monday - Friday. Please note that voicemails left after 4:00 p.m. may not be returned until the following business day.  We are closed weekends and major holidays. You have access to a nurse at all times for urgent questions. Please call the main number to the clinic [phone] and follow the prompts.  For any non-urgent questions, you may also contact your provider using MyChart. We now offer e-Visits for anyone 60 and older to request care online for non-urgent symptoms. For details visit [email or web address].   Also download the MyChart app! Go to the app store, search "MyChart", open the app, select [facility], and log in with your MyChart username and password.

## 2022-03-11 ENCOUNTER — Other Ambulatory Visit: Payer: Self-pay

## 2022-03-17 ENCOUNTER — Inpatient Hospital Stay: Payer: Medicare Other

## 2022-03-17 ENCOUNTER — Inpatient Hospital Stay: Payer: Medicare Other | Attending: Hematology & Oncology

## 2022-03-17 VITALS — BP 141/56 | HR 57 | Temp 97.7°F | Resp 18

## 2022-03-17 DIAGNOSIS — Z5112 Encounter for antineoplastic immunotherapy: Secondary | ICD-10-CM | POA: Insufficient documentation

## 2022-03-17 DIAGNOSIS — C9000 Multiple myeloma not having achieved remission: Secondary | ICD-10-CM | POA: Diagnosis present

## 2022-03-17 DIAGNOSIS — R002 Palpitations: Secondary | ICD-10-CM | POA: Insufficient documentation

## 2022-03-17 DIAGNOSIS — M549 Dorsalgia, unspecified: Secondary | ICD-10-CM | POA: Diagnosis not present

## 2022-03-17 DIAGNOSIS — R5383 Other fatigue: Secondary | ICD-10-CM | POA: Diagnosis not present

## 2022-03-17 DIAGNOSIS — M791 Myalgia, unspecified site: Secondary | ICD-10-CM | POA: Diagnosis not present

## 2022-03-17 DIAGNOSIS — R0602 Shortness of breath: Secondary | ICD-10-CM | POA: Diagnosis not present

## 2022-03-17 DIAGNOSIS — Z7982 Long term (current) use of aspirin: Secondary | ICD-10-CM | POA: Diagnosis not present

## 2022-03-17 DIAGNOSIS — Z7961 Long term (current) use of immunomodulator: Secondary | ICD-10-CM | POA: Diagnosis not present

## 2022-03-17 DIAGNOSIS — Z79899 Other long term (current) drug therapy: Secondary | ICD-10-CM | POA: Insufficient documentation

## 2022-03-17 DIAGNOSIS — D472 Monoclonal gammopathy: Secondary | ICD-10-CM

## 2022-03-17 LAB — CBC WITH DIFFERENTIAL (CANCER CENTER ONLY)
Abs Immature Granulocytes: 0.02 10*3/uL (ref 0.00–0.07)
Basophils Absolute: 0 10*3/uL (ref 0.0–0.1)
Basophils Relative: 0 %
Eosinophils Absolute: 0.1 10*3/uL (ref 0.0–0.5)
Eosinophils Relative: 1 %
HCT: 35.8 % — ABNORMAL LOW (ref 39.0–52.0)
Hemoglobin: 11.4 g/dL — ABNORMAL LOW (ref 13.0–17.0)
Immature Granulocytes: 0 %
Lymphocytes Relative: 25 %
Lymphs Abs: 1.4 10*3/uL (ref 0.7–4.0)
MCH: 29.6 pg (ref 26.0–34.0)
MCHC: 31.8 g/dL (ref 30.0–36.0)
MCV: 93 fL (ref 80.0–100.0)
Monocytes Absolute: 0.5 10*3/uL (ref 0.1–1.0)
Monocytes Relative: 10 %
Neutro Abs: 3.5 10*3/uL (ref 1.7–7.7)
Neutrophils Relative %: 64 %
Platelet Count: 113 10*3/uL — ABNORMAL LOW (ref 150–400)
RBC: 3.85 MIL/uL — ABNORMAL LOW (ref 4.22–5.81)
RDW: 15.9 % — ABNORMAL HIGH (ref 11.5–15.5)
WBC Count: 5.5 10*3/uL (ref 4.0–10.5)
nRBC: 0 % (ref 0.0–0.2)

## 2022-03-17 MED ORDER — DARATUMUMAB-HYALURONIDASE-FIHJ 1800-30000 MG-UT/15ML ~~LOC~~ SOLN
1800.0000 mg | Freq: Once | SUBCUTANEOUS | Status: AC
  Administered 2022-03-17: 1800 mg via SUBCUTANEOUS
  Filled 2022-03-17: qty 15

## 2022-03-17 MED ORDER — DEXAMETHASONE 4 MG PO TABS: 20.0000 mg | ORAL_TABLET | Freq: Once | ORAL | Status: DC

## 2022-03-17 MED ORDER — DIPHENHYDRAMINE HCL 25 MG PO CAPS: 50.0000 mg | ORAL_CAPSULE | Freq: Once | ORAL | Status: DC

## 2022-03-17 MED ORDER — ACETAMINOPHEN 325 MG PO TABS: 650.0000 mg | ORAL_TABLET | Freq: Once | ORAL | Status: DC

## 2022-03-17 NOTE — Patient Instructions (Signed)
[facility] [place]  Discharge Instructions: Thank you for choosing [facility] to provide your oncology and hematology care.   If you have a lab appointment with the [facility], please go directly to the [facility] and check in at the registration area.  Wear comfortable clothing and clothing appropriate for easy access to any Portacath or PICC line.   We strive to give you quality time with your provider. You may need to reschedule your appointment if you arrive late (15 or more minutes).  Arriving late affects you and other patients whose appointments are after yours.  Also, if you miss three or more appointments without notifying the office, you may be dismissed from the clinic at the provider's discretion.      For prescription refill requests, have your pharmacy contact our office and allow 72 hours for refills to be completed.    Today you received the following chemotherapy and/or immunotherapy agents faspro     To help prevent nausea and vomiting after your treatment, we encourage you to take your nausea medication as directed.  BELOW ARE SYMPTOMS THAT SHOULD BE REPORTED IMMEDIATELY: *FEVER GREATER THAN 100.4 F (38 C) OR HIGHER *CHILLS OR SWEATING *NAUSEA AND VOMITING THAT IS NOT CONTROLLED WITH YOUR NAUSEA MEDICATION *UNUSUAL SHORTNESS OF BREATH *UNUSUAL BRUISING OR BLEEDING *URINARY PROBLEMS (pain or burning when urinating, or frequent urination) *BOWEL PROBLEMS (unusual diarrhea, constipation, pain near the anus) TENDERNESS IN MOUTH AND THROAT WITH OR WITHOUT PRESENCE OF ULCERS (sore throat, sores in mouth, or a toothache) UNUSUAL RASH, SWELLING OR PAIN  UNUSUAL VAGINAL DISCHARGE OR ITCHING   Items with * indicate a potential emergency and should be followed up as soon as possible or go to the Emergency Department if any problems should occur.  Please show the CHEMOTHERAPY ALERT CARD or IMMUNOTHERAPY ALERT CARD at check-in to  the Emergency Department and triage nurse. Should you have questions after your visit or need to cancel or reschedule your appointment, please contact [facility]  [phone] and follow the prompts.  Office hours are 8:00 a.m. to 4:30 p.m. Monday - Friday. Please note that voicemails left after 4:00 p.m. may not be returned until the following business day.  We are closed weekends and major holidays. You have access to a nurse at all times for urgent questions. Please call the main number to the clinic [phone] and follow the prompts.  For any non-urgent questions, you may also contact your provider using MyChart. We now offer e-Visits for anyone 23 and older to request care online for non-urgent symptoms. For details visit [email or web address].   Also download the MyChart app! Go to the app store, search "MyChart", open the app, select [facility], and log in with your MyChart username and password.

## 2022-03-24 ENCOUNTER — Inpatient Hospital Stay: Payer: Medicare Other

## 2022-03-25 ENCOUNTER — Encounter: Payer: Self-pay | Admitting: Hematology & Oncology

## 2022-03-25 ENCOUNTER — Inpatient Hospital Stay: Payer: Medicare Other

## 2022-03-25 ENCOUNTER — Inpatient Hospital Stay (HOSPITAL_BASED_OUTPATIENT_CLINIC_OR_DEPARTMENT_OTHER): Payer: Medicare Other | Admitting: Hematology & Oncology

## 2022-03-25 VITALS — BP 133/61 | HR 64 | Temp 97.6°F | Resp 17 | Wt 166.4 lb

## 2022-03-25 DIAGNOSIS — Z5112 Encounter for antineoplastic immunotherapy: Secondary | ICD-10-CM | POA: Diagnosis not present

## 2022-03-25 DIAGNOSIS — D472 Monoclonal gammopathy: Secondary | ICD-10-CM | POA: Diagnosis not present

## 2022-03-25 LAB — LACTATE DEHYDROGENASE: LDH: 110 U/L (ref 98–192)

## 2022-03-25 LAB — CMP (CANCER CENTER ONLY)
ALT: 9 U/L (ref 0–44)
AST: 11 U/L — ABNORMAL LOW (ref 15–41)
Albumin: 4.1 g/dL (ref 3.5–5.0)
Alkaline Phosphatase: 121 U/L (ref 38–126)
Anion gap: 8 (ref 5–15)
BUN: 29 mg/dL — ABNORMAL HIGH (ref 8–23)
CO2: 26 mmol/L (ref 22–32)
Calcium: 8.9 mg/dL (ref 8.9–10.3)
Chloride: 108 mmol/L (ref 98–111)
Creatinine: 1.51 mg/dL — ABNORMAL HIGH (ref 0.61–1.24)
GFR, Estimated: 46 mL/min — ABNORMAL LOW (ref >60)
Glucose, Bld: 137 mg/dL — ABNORMAL HIGH (ref 70–99)
Potassium: 4.4 mmol/L (ref 3.5–5.1)
Sodium: 142 mmol/L (ref 135–145)
Total Bilirubin: 0.4 mg/dL (ref 0.3–1.2)
Total Protein: 6.5 g/dL (ref 6.5–8.1)

## 2022-03-25 LAB — CBC WITH DIFFERENTIAL (CANCER CENTER ONLY)
Abs Immature Granulocytes: 0.02 10*3/uL (ref 0.00–0.07)
Basophils Absolute: 0 10*3/uL (ref 0.0–0.1)
Basophils Relative: 0 %
Eosinophils Absolute: 0.1 10*3/uL (ref 0.0–0.5)
Eosinophils Relative: 1 %
HCT: 35.8 % — ABNORMAL LOW (ref 39.0–52.0)
Hemoglobin: 11.7 g/dL — ABNORMAL LOW (ref 13.0–17.0)
Immature Granulocytes: 0 %
Lymphocytes Relative: 30 %
Lymphs Abs: 1.4 10*3/uL (ref 0.7–4.0)
MCH: 30.4 pg (ref 26.0–34.0)
MCHC: 32.7 g/dL (ref 30.0–36.0)
MCV: 93 fL (ref 80.0–100.0)
Monocytes Absolute: 0.4 10*3/uL (ref 0.1–1.0)
Monocytes Relative: 8 %
Neutro Abs: 2.8 10*3/uL (ref 1.7–7.7)
Neutrophils Relative %: 61 %
Platelet Count: 172 10*3/uL (ref 150–400)
RBC: 3.85 MIL/uL — ABNORMAL LOW (ref 4.22–5.81)
RDW: 16.1 % — ABNORMAL HIGH (ref 11.5–15.5)
WBC Count: 4.7 10*3/uL (ref 4.0–10.5)
nRBC: 0 % (ref 0.0–0.2)

## 2022-03-25 LAB — IRON AND IRON BINDING CAPACITY (CC-WL,HP ONLY)
Iron: 57 ug/dL (ref 45–182)
Saturation Ratios: 18 % (ref 17.9–39.5)
TIBC: 309 ug/dL (ref 250–450)
UIBC: 252 ug/dL (ref 117–376)

## 2022-03-25 MED ORDER — DARATUMUMAB-HYALURONIDASE-FIHJ 1800-30000 MG-UT/15ML ~~LOC~~ SOLN
1800.0000 mg | Freq: Once | SUBCUTANEOUS | Status: AC
  Administered 2022-03-25: 1800 mg via SUBCUTANEOUS
  Filled 2022-03-25: qty 15

## 2022-03-25 MED ORDER — BORTEZOMIB CHEMO SQ INJECTION 3.5 MG (2.5MG/ML)
1.3000 mg/m2 | Freq: Once | INTRAMUSCULAR | Status: AC
  Administered 2022-03-25: 2.5 mg via SUBCUTANEOUS
  Filled 2022-03-25: qty 1

## 2022-03-25 MED ORDER — ACETAMINOPHEN 325 MG PO TABS: 650.0000 mg | ORAL_TABLET | Freq: Once | ORAL | Status: DC

## 2022-03-25 MED ORDER — DIPHENHYDRAMINE HCL 25 MG PO CAPS: 50.0000 mg | ORAL_CAPSULE | Freq: Once | ORAL | Status: DC

## 2022-03-25 MED ORDER — DEXAMETHASONE 4 MG PO TABS: 20.0000 mg | ORAL_TABLET | Freq: Once | ORAL | Status: DC

## 2022-03-25 MED ORDER — DEXAMETHASONE 4 MG PO TABS: ORAL_TABLET | ORAL | 2 refills | Status: DC

## 2022-03-25 NOTE — Patient Instructions (Signed)
[facility]  Discharge Instructions: Thank you for choosing [facility] to provide your oncology and hematology care.   If you have a lab appointment with the Cancer Center, please go directly to the Cancer Center and check in at the registration area.  Wear comfortable clothing and clothing appropriate for easy access to any Portacath or PICC line.   We strive to give you quality time with your provider. You may need to reschedule your appointment if you arrive late (15 or more minutes).  Arriving late affects you and other patients whose appointments are after yours.  Also, if you miss three or more appointments without notifying the office, you may be dismissed from the clinic at the provider's discretion.      For prescription refill requests, have your pharmacy contact our office and allow 72 hours for refills to be completed.    Today you received the following chemotherapy and/or immunotherapy agents velcade, faspro     To help prevent nausea and vomiting after your treatment, we encourage you to take your nausea medication as directed.  BELOW ARE SYMPTOMS THAT SHOULD BE REPORTED IMMEDIATELY: *FEVER GREATER THAN 100.4 F (38 C) OR HIGHER *CHILLS OR SWEATING *NAUSEA AND VOMITING THAT IS NOT CONTROLLED WITH YOUR NAUSEA MEDICATION *UNUSUAL SHORTNESS OF BREATH *UNUSUAL BRUISING OR BLEEDING *URINARY PROBLEMS (pain or burning when urinating, or frequent urination) *BOWEL PROBLEMS (unusual diarrhea, constipation, pain near the anus) TENDERNESS IN MOUTH AND THROAT WITH OR WITHOUT PRESENCE OF ULCERS (sore throat, sores in mouth, or a toothache) UNUSUAL RASH, SWELLING OR PAIN  UNUSUAL VAGINAL DISCHARGE OR ITCHING   Items with * indicate a potential emergency and should be followed up as soon as possible or go to the Emergency Department if any problems should occur.  Please show the CHEMOTHERAPY ALERT CARD or IMMUNOTHERAPY ALERT CARD at  check-in to the Emergency Department and triage nurse. Should you have questions after your visit or need to cancel or reschedule your appointment, please contact [facility]  [phone] and follow the prompts.  Office hours are 8:00 a.m. to 4:30 p.m. Monday - Friday. Please note that voicemails left after 4:00 p.m. may not be returned until the following business day.  We are closed weekends and major holidays. You have access to a nurse at all times for urgent questions. Please call the main number to the clinic [phone] and follow the prompts.  For any non-urgent questions, you may also contact your provider using MyChart. We now offer e-Visits for anyone 18 and older to request care online for non-urgent symptoms. For details visit [email or web address].   Also download the MyChart app! Go to the app store, search "MyChart", open the app, select [facility], and log in with your MyChart username and password.   

## 2022-03-25 NOTE — Addendum Note (Signed)
Addended by: Fabio Neighbors A on: 03/25/2022 12:52 PM   Modules accepted: Orders

## 2022-03-25 NOTE — Progress Notes (Signed)
Hematology and Oncology Follow Up Visit  James Marks [id number] [birth year] 82 y.o. 03/25/2022   Principle Diagnosis:  IgA kappa  myeloma  Current Therapy:   Velcade/Revlimid/Decadron -- s/p cycle #6 -- start on 07/27/2021 Faspro/Velcade/Revlimid/Decadron -- s/p cycle #2 - start  on 01/28/2022 --Revlimid  DC'd on 02/24/2022     Interim History:  James Marks is back for follow-up.  He is doing little bit better now that we stop the Revlimid.  He has a little bit more energy.  He is still not out playing golf.  Nothing has gone to the golf range.  He is responding to treatment.  His light chain keeps coming down.  When we last saw him, his Kappa light chain was down to 43 mg/L.  The monoclonal spike was 0.1 g/dL.  His IgA level was 192 mg/dL.  He has had no fever.  Unfortunate, his wife I think may have COVID.  They are avoiding each other right now.  He has had no change in bowel or bladder habits.  He has had no nausea or vomiting.  He has had no cough or shortness of breath.  He has had no rashes.  There is been no bleeding.  Overall, I was his performance status is ECOG 1.     Medications:  Current Outpatient Medications:    acetaminophen (TYLENOL) 325 MG tablet, Take 650 mg by mouth. 02/10/2022 Takes 650 mg one hour prior to antibody treatment., Disp: , Rfl:    acyclovir (ZOVIRAX) 400 MG tablet, TAKE 1 TABLET(400 MG) BY MOUTH TWICE DAILY, Disp: 60 tablet, Rfl: 3   amLODipine (NORVASC) 2.5 MG tablet, Take 1 tablet (2.5 mg total) by mouth daily., Disp: 90 tablet, Rfl: 3   aspirin 325 MG tablet, Take 325 mg by mouth daily., Disp: , Rfl:    dexamethasone (DECADRON) 4 MG tablet, Take 5 tablets (20 mg) 1 hour prior to chemo treatment., Disp: 20 tablet, Rfl: 1   diphenhydrAMINE (BENADRYL) 25 mg capsule, Take 50 mg by mouth. 02/10/2022 Takes one hour prior to antibody treatment., Disp: , Rfl:    gabapentin (NEURONTIN) 300 MG capsule, TAKE ONE CAPSULE BY MOUTH DAILY WITH BREAKFAST, 1  CAPSULE WITH DINNER AND 2 CAPSULES AT BEDTIME, Disp: 360 capsule, Rfl: 4   lidocaine-prilocaine (EMLA) cream, Apply to affected area once, Disp: 30 g, Rfl: 3   losartan (COZAAR) 50 MG tablet, TAKE 1 TABLET BY MOUTH DAILY, Disp: 90 tablet, Rfl: 3   montelukast (SINGULAIR) 10 MG tablet, Take 1 tablet (10 mg total) by mouth at bedtime., Disp: 30 tablet, Rfl: 2   Multiple Vitamins-Minerals (PRESERVISION AREDS 2 PO), Take by mouth daily at 6 (six) AM., Disp: , Rfl:    ondansetron (ZOFRAN) 8 MG tablet, Take 1 tablet (8 mg total) by mouth every 8 (eight) hours as needed for nausea or vomiting., Disp: 30 tablet, Rfl: 1   prochlorperazine (COMPAZINE) 10 MG tablet, Take 1 tablet (10 mg total) by mouth every 6 (six) hours as needed for nausea or vomiting., Disp: 30 tablet, Rfl: 1   traZODone (DESYREL) 50 MG tablet, Take 1 tablet (50 mg total) by mouth at bedtime., Disp: 30 tablet, Rfl: 3   amoxicillin (AMOXIL) 500 MG capsule, Take four tablets (2000 mg) total one hour prior to dental procedure. (Patient not taking: Reported on 03/25/2022), Disp: 4 capsule, Rfl: 0 No current facility-administered medications for this visit.  Facility-Administered Medications Ordered in Other Visits:    0.9 %  sodium chloride infusion, ,  Intravenous, Continuous, James Marks, James Cobb, MD, Stopped at 08/05/14 1710  Allergies: No Known Allergies  Past Medical History, Surgical history, Social history, and Family History were reviewed and updated.  Review of Systems: Review of Systems  Constitutional:  Positive for malaise/fatigue.  HENT:  Positive for congestion.   Eyes: Negative.   Respiratory:  Positive for shortness of breath.   Cardiovascular:  Positive for palpitations.  Gastrointestinal: Negative.   Genitourinary: Negative.   Musculoskeletal:  Positive for back pain and myalgias.  Skin: Negative.   Neurological:  Positive for tingling.  Endo/Heme/Allergies: Negative.   Psychiatric/Behavioral: Negative.       Physical Exam:  weight is 166 lb 6.4 oz (75.5 kg). His oral temperature is 97.6 F (36.4 C). His blood pressure is 133/61 and his pulse is 64. His respiration is 17 and oxygen saturation is 99%.   Wt Readings from Last 3 Encounters:  03/25/22 166 lb 6.4 oz (75.5 kg)  02/24/22 171 lb 1.3 oz (77.6 kg)  02/10/22 171 lb 6.4 oz (77.7 kg)     Physical Exam Vitals reviewed.  HENT:     Head: Normocephalic and atraumatic.  Eyes:     Pupils: Pupils are equal, round, and reactive to light.  Cardiovascular:     Rate and Rhythm: Normal rate and regular rhythm.     Heart sounds: Normal heart sounds.  Pulmonary:     Effort: Pulmonary effort is normal.     Breath sounds: Normal breath sounds.  Abdominal:     General: Bowel sounds are normal.     Palpations: Abdomen is soft.  Musculoskeletal:        General: No tenderness or deformity. Normal range of motion.     Cervical back: Normal range of motion.  Lymphadenopathy:     Cervical: No cervical adenopathy.  Skin:    General: Skin is warm and dry.     Findings: No erythema or rash.  Neurological:     Mental Status: He is alert and oriented to person, place, and time.  Psychiatric:        Behavior: Behavior normal.        Thought Content: Thought content normal.        Judgment: Judgment normal.      Lab Results  Component Value Date   WBC 4.7 03/25/2022   HGB 11.7 (L) 03/25/2022   HCT 35.8 (L) 03/25/2022   MCV 93.0 03/25/2022   PLT 172 03/25/2022     Chemistry      Component Value Date/Time   NA 142 03/25/2022 0934   NA 143 12/06/2016 1156   NA 139 08/02/2016 1146   K 4.4 03/25/2022 0934   K 4.2 12/06/2016 1156   K 4.7 08/02/2016 1146   CL 108 03/25/2022 0934   CL 107 12/06/2016 1156   CO2 26 03/25/2022 0934   CO2 28 12/06/2016 1156   CO2 24 08/02/2016 1146   BUN 29 (H) 03/25/2022 0934   BUN 29 (H) 12/06/2016 1156   BUN 34.3 (H) 08/02/2016 1146   CREATININE 1.51 (H) 03/25/2022 0934   CREATININE 2.1 (H)  12/06/2016 1156   CREATININE 1.7 (H) 08/02/2016 1146      Component Value Date/Time   CALCIUM 8.9 03/25/2022 0934   CALCIUM 9.1 12/06/2016 1156   CALCIUM 9.5 08/02/2016 1146   ALKPHOS 121 03/25/2022 0934   ALKPHOS 108 (H) 12/06/2016 1156   ALKPHOS 115 08/02/2016 1146   AST 11 (L) 03/25/2022 0934   AST 14  08/02/2016 1146   ALT 9 03/25/2022 0934   ALT 18 12/06/2016 1156   ALT 9 08/02/2016 1146   BILITOT 0.4 03/25/2022 0934   BILITOT 0.41 08/02/2016 1146      Impression and Plan:  Mr. Summerton is 82 year old man with IgA kappa myeloma.  He is on fairly aggressive therapy right now.  We had added the Faspro.  I think adding the Faspro clearly helped him out.  His Kappa light chain continues to come down.  We will see what the 24-hour urine shows.  I am just happy that he has a little bit more energy.  Hopefully, stopping the Revlimid has helped this.  He gets treated every 2 weeks.  I will plan to see him back myself in 4 weeks.   [name], MD 3/14/202410:33 AM

## 2022-03-26 LAB — KAPPA/LAMBDA LIGHT CHAINS
Kappa free light chain: 35.7 mg/L — ABNORMAL HIGH (ref 3.3–19.4)
Kappa, lambda light chain ratio: 2.73 — ABNORMAL HIGH (ref 0.26–1.65)
Lambda free light chains: 13.1 mg/L (ref 5.7–26.3)

## 2022-03-27 LAB — IGG, IGA, IGM
IgA: 126 mg/dL (ref 61–437)
IgG (Immunoglobin G), Serum: 725 mg/dL (ref 603–1613)
IgM (Immunoglobulin M), Srm: 29 mg/dL (ref 15–143)

## 2022-03-29 ENCOUNTER — Encounter: Payer: Self-pay | Admitting: *Deleted

## 2022-03-31 ENCOUNTER — Inpatient Hospital Stay: Payer: Medicare Other

## 2022-04-01 LAB — PROTEIN ELECTROPHORESIS, SERUM, WITH REFLEX
A/G Ratio: 1.5 (ref 0.7–1.7)
Albumin ELP: 3.7 g/dL (ref 2.9–4.4)
Alpha-1-Globulin: 0.3 g/dL (ref 0.0–0.4)
Alpha-2-Globulin: 0.6 g/dL (ref 0.4–1.0)
Beta Globulin: 0.8 g/dL (ref 0.7–1.3)
Gamma Globulin: 0.7 g/dL (ref 0.4–1.8)
Globulin, Total: 2.4 g/dL (ref 2.2–3.9)
M-Spike, %: 0.2 g/dL — ABNORMAL HIGH
SPEP Interpretation: 0
Total Protein ELP: 6.1 g/dL (ref 6.0–8.5)

## 2022-04-01 LAB — IMMUNOFIXATION REFLEX, SERUM
IgA: 136 mg/dL (ref 61–437)
IgG (Immunoglobin G), Serum: 791 mg/dL (ref 603–1613)
IgM (Immunoglobulin M), Srm: 35 mg/dL (ref 15–143)

## 2022-04-07 ENCOUNTER — Inpatient Hospital Stay: Payer: Medicare Other

## 2022-04-07 VITALS — BP 146/67 | HR 80 | Temp 97.5°F | Resp 16

## 2022-04-07 DIAGNOSIS — Z5112 Encounter for antineoplastic immunotherapy: Secondary | ICD-10-CM | POA: Diagnosis not present

## 2022-04-07 DIAGNOSIS — D472 Monoclonal gammopathy: Secondary | ICD-10-CM

## 2022-04-07 LAB — CBC WITH DIFFERENTIAL (CANCER CENTER ONLY)
Abs Immature Granulocytes: 0.01 10*3/uL (ref 0.00–0.07)
Basophils Absolute: 0 10*3/uL (ref 0.0–0.1)
Basophils Relative: 0 %
Eosinophils Absolute: 0.1 10*3/uL (ref 0.0–0.5)
Eosinophils Relative: 1 %
HCT: 37.5 % — ABNORMAL LOW (ref 39.0–52.0)
Hemoglobin: 12.1 g/dL — ABNORMAL LOW (ref 13.0–17.0)
Immature Granulocytes: 0 %
Lymphocytes Relative: 24 %
Lymphs Abs: 1.6 10*3/uL (ref 0.7–4.0)
MCH: 29.9 pg (ref 26.0–34.0)
MCHC: 32.3 g/dL (ref 30.0–36.0)
MCV: 92.6 fL (ref 80.0–100.0)
Monocytes Absolute: 0.4 10*3/uL (ref 0.1–1.0)
Monocytes Relative: 6 %
Neutro Abs: 4.5 10*3/uL (ref 1.7–7.7)
Neutrophils Relative %: 69 %
Platelet Count: 158 10*3/uL (ref 150–400)
RBC: 4.05 MIL/uL — ABNORMAL LOW (ref 4.22–5.81)
RDW: 14.8 % (ref 11.5–15.5)
WBC Count: 6.5 10*3/uL (ref 4.0–10.5)
nRBC: 0 % (ref 0.0–0.2)

## 2022-04-07 LAB — CMP (CANCER CENTER ONLY)
ALT: 9 U/L (ref 0–44)
AST: 11 U/L — ABNORMAL LOW (ref 15–41)
Albumin: 4.3 g/dL (ref 3.5–5.0)
Alkaline Phosphatase: 117 U/L (ref 38–126)
Anion gap: 8 (ref 5–15)
BUN: 22 mg/dL (ref 8–23)
CO2: 28 mmol/L (ref 22–32)
Calcium: 9.2 mg/dL (ref 8.9–10.3)
Chloride: 104 mmol/L (ref 98–111)
Creatinine: 1.49 mg/dL — ABNORMAL HIGH (ref 0.61–1.24)
GFR, Estimated: 46 mL/min — ABNORMAL LOW (ref >60)
Glucose, Bld: 141 mg/dL — ABNORMAL HIGH (ref 70–99)
Potassium: 4.6 mmol/L (ref 3.5–5.1)
Sodium: 140 mmol/L (ref 135–145)
Total Bilirubin: 0.4 mg/dL (ref 0.3–1.2)
Total Protein: 6.5 g/dL (ref 6.5–8.1)

## 2022-04-07 MED ORDER — BORTEZOMIB CHEMO SQ INJECTION 3.5 MG (2.5MG/ML)
1.3000 mg/m2 | Freq: Once | INTRAMUSCULAR | Status: AC
  Administered 2022-04-07: 2.5 mg via SUBCUTANEOUS
  Filled 2022-04-07: qty 1

## 2022-04-07 MED ORDER — DARATUMUMAB-HYALURONIDASE-FIHJ 1800-30000 MG-UT/15ML ~~LOC~~ SOLN
1800.0000 mg | Freq: Once | SUBCUTANEOUS | Status: AC
  Administered 2022-04-07: 1800 mg via SUBCUTANEOUS
  Filled 2022-04-07: qty 15

## 2022-04-07 MED ORDER — DIPHENHYDRAMINE HCL 25 MG PO CAPS: 50.0000 mg | ORAL_CAPSULE | Freq: Once | ORAL | Status: DC

## 2022-04-07 MED ORDER — DEXAMETHASONE 4 MG PO TABS: 20.0000 mg | ORAL_TABLET | Freq: Once | ORAL | Status: DC

## 2022-04-07 MED ORDER — ACETAMINOPHEN 325 MG PO TABS: 650.0000 mg | ORAL_TABLET | Freq: Once | ORAL | Status: DC

## 2022-04-07 NOTE — Patient Instructions (Signed)
[facility]  Discharge Instructions: Thank you for choosing [facility] to provide your oncology and hematology care.   If you have a lab appointment with the Cancer Center, please go directly to the Cancer Center and check in at the registration area.  Wear comfortable clothing and clothing appropriate for easy access to any Portacath or PICC line.   We strive to give you quality time with your provider. You may need to reschedule your appointment if you arrive late (15 or more minutes).  Arriving late affects you and other patients whose appointments are after yours.  Also, if you miss three or more appointments without notifying the office, you may be dismissed from the clinic at the provider's discretion.      For prescription refill requests, have your pharmacy contact our office and allow 72 hours for refills to be completed.    Today you received the following chemotherapy and/or immunotherapy agents velcade, faspro     To help prevent nausea and vomiting after your treatment, we encourage you to take your nausea medication as directed.  BELOW ARE SYMPTOMS THAT SHOULD BE REPORTED IMMEDIATELY: *FEVER GREATER THAN 100.4 F (38 C) OR HIGHER *CHILLS OR SWEATING *NAUSEA AND VOMITING THAT IS NOT CONTROLLED WITH YOUR NAUSEA MEDICATION *UNUSUAL SHORTNESS OF BREATH *UNUSUAL BRUISING OR BLEEDING *URINARY PROBLEMS (pain or burning when urinating, or frequent urination) *BOWEL PROBLEMS (unusual diarrhea, constipation, pain near the anus) TENDERNESS IN MOUTH AND THROAT WITH OR WITHOUT PRESENCE OF ULCERS (sore throat, sores in mouth, or a toothache) UNUSUAL RASH, SWELLING OR PAIN  UNUSUAL VAGINAL DISCHARGE OR ITCHING   Items with * indicate a potential emergency and should be followed up as soon as possible or go to the Emergency Department if any problems should occur.  Please show the CHEMOTHERAPY ALERT CARD or IMMUNOTHERAPY ALERT CARD at  check-in to the Emergency Department and triage nurse. Should you have questions after your visit or need to cancel or reschedule your appointment, please contact [facility]  [phone] and follow the prompts.  Office hours are 8:00 a.m. to 4:30 p.m. Monday - Friday. Please note that voicemails left after 4:00 p.m. may not be returned until the following business day.  We are closed weekends and major holidays. You have access to a nurse at all times for urgent questions. Please call the main number to the clinic [phone] and follow the prompts.  For any non-urgent questions, you may also contact your provider using MyChart. We now offer e-Visits for anyone 18 and older to request care online for non-urgent symptoms. For details visit [email or web address].   Also download the MyChart app! Go to the app store, search "MyChart", open the app, select [facility], and log in with your MyChart username and password.   

## 2022-04-09 LAB — UPEP/UIFE/LIGHT CHAINS/TP, 24-HR UR
% BETA, Urine: 17.6 %
ALPHA 1 URINE: 4.2 %
Albumin, U: 56.9 %
Alpha 2, Urine: 5.5 %
Free Kappa Lt Chains,Ur: 23.33 mg/L (ref 1.17–86.46)
Free Kappa/Lambda Ratio: 10.42 (ref 1.83–14.26)
Free Lambda Lt Chains,Ur: 2.24 mg/L (ref 0.27–15.21)
GAMMA GLOBULIN URINE: 15.7 %
Total Protein, Urine-Ur/day: 143 mg/24 hr (ref 30–150)
Total Protein, Urine: 5.4 mg/dL
Total Volume: 2650

## 2022-04-12 ENCOUNTER — Encounter: Payer: Self-pay | Admitting: *Deleted

## 2022-04-13 ENCOUNTER — Other Ambulatory Visit: Payer: Self-pay

## 2022-04-13 ENCOUNTER — Encounter: Payer: Self-pay | Admitting: Hematology & Oncology

## 2022-04-14 ENCOUNTER — Inpatient Hospital Stay: Payer: Medicare Other

## 2022-04-15 ENCOUNTER — Other Ambulatory Visit: Payer: Self-pay

## 2022-04-21 ENCOUNTER — Inpatient Hospital Stay: Payer: Medicare Other

## 2022-04-21 ENCOUNTER — Other Ambulatory Visit: Payer: Self-pay

## 2022-04-21 ENCOUNTER — Inpatient Hospital Stay: Payer: Medicare Other | Attending: Hematology & Oncology | Admitting: Hematology & Oncology

## 2022-04-21 ENCOUNTER — Encounter: Payer: Self-pay | Admitting: Hematology & Oncology

## 2022-04-21 VITALS — BP 147/65 | HR 54 | Temp 97.7°F | Resp 18 | Ht 74.0 in | Wt 172.6 lb

## 2022-04-21 DIAGNOSIS — D472 Monoclonal gammopathy: Secondary | ICD-10-CM | POA: Diagnosis not present

## 2022-04-21 DIAGNOSIS — Z7961 Long term (current) use of immunomodulator: Secondary | ICD-10-CM | POA: Diagnosis not present

## 2022-04-21 DIAGNOSIS — R5383 Other fatigue: Secondary | ICD-10-CM | POA: Diagnosis not present

## 2022-04-21 DIAGNOSIS — R0602 Shortness of breath: Secondary | ICD-10-CM | POA: Insufficient documentation

## 2022-04-21 DIAGNOSIS — Z79620 Long term (current) use of immunosuppressive biologic: Secondary | ICD-10-CM | POA: Insufficient documentation

## 2022-04-21 DIAGNOSIS — Z5112 Encounter for antineoplastic immunotherapy: Secondary | ICD-10-CM | POA: Diagnosis present

## 2022-04-21 DIAGNOSIS — Z7982 Long term (current) use of aspirin: Secondary | ICD-10-CM | POA: Insufficient documentation

## 2022-04-21 DIAGNOSIS — R002 Palpitations: Secondary | ICD-10-CM | POA: Insufficient documentation

## 2022-04-21 DIAGNOSIS — M791 Myalgia, unspecified site: Secondary | ICD-10-CM | POA: Insufficient documentation

## 2022-04-21 DIAGNOSIS — Z79899 Other long term (current) drug therapy: Secondary | ICD-10-CM | POA: Insufficient documentation

## 2022-04-21 DIAGNOSIS — C9000 Multiple myeloma not having achieved remission: Secondary | ICD-10-CM | POA: Diagnosis present

## 2022-04-21 DIAGNOSIS — M549 Dorsalgia, unspecified: Secondary | ICD-10-CM | POA: Diagnosis not present

## 2022-04-21 LAB — CBC WITH DIFFERENTIAL (CANCER CENTER ONLY)
Abs Immature Granulocytes: 0.01 10*3/uL (ref 0.00–0.07)
Basophils Absolute: 0 10*3/uL (ref 0.0–0.1)
Basophils Relative: 0 %
Eosinophils Absolute: 0.1 10*3/uL (ref 0.0–0.5)
Eosinophils Relative: 1 %
HCT: 38.8 % — ABNORMAL LOW (ref 39.0–52.0)
Hemoglobin: 12.3 g/dL — ABNORMAL LOW (ref 13.0–17.0)
Immature Granulocytes: 0 %
Lymphocytes Relative: 25 %
Lymphs Abs: 1.7 10*3/uL (ref 0.7–4.0)
MCH: 30.1 pg (ref 26.0–34.0)
MCHC: 31.7 g/dL (ref 30.0–36.0)
MCV: 95.1 fL (ref 80.0–100.0)
Monocytes Absolute: 0.6 10*3/uL (ref 0.1–1.0)
Monocytes Relative: 9 %
Neutro Abs: 4.5 10*3/uL (ref 1.7–7.7)
Neutrophils Relative %: 65 %
Platelet Count: 170 10*3/uL (ref 150–400)
RBC: 4.08 MIL/uL — ABNORMAL LOW (ref 4.22–5.81)
RDW: 14.7 % (ref 11.5–15.5)
WBC Count: 7 10*3/uL (ref 4.0–10.5)
nRBC: 0 % (ref 0.0–0.2)

## 2022-04-21 LAB — CMP (CANCER CENTER ONLY)
ALT: 8 U/L (ref 0–44)
AST: 11 U/L — ABNORMAL LOW (ref 15–41)
Albumin: 4.1 g/dL (ref 3.5–5.0)
Alkaline Phosphatase: 113 U/L (ref 38–126)
Anion gap: 6 (ref 5–15)
BUN: 25 mg/dL — ABNORMAL HIGH (ref 8–23)
CO2: 30 mmol/L (ref 22–32)
Calcium: 9.3 mg/dL (ref 8.9–10.3)
Chloride: 106 mmol/L (ref 98–111)
Creatinine: 1.49 mg/dL — ABNORMAL HIGH (ref 0.61–1.24)
GFR, Estimated: 46 mL/min — ABNORMAL LOW (ref >60)
Glucose, Bld: 104 mg/dL — ABNORMAL HIGH (ref 70–99)
Potassium: 4.8 mmol/L (ref 3.5–5.1)
Sodium: 142 mmol/L (ref 135–145)
Total Bilirubin: 0.4 mg/dL (ref 0.3–1.2)
Total Protein: 6.5 g/dL (ref 6.5–8.1)

## 2022-04-21 LAB — LACTATE DEHYDROGENASE: LDH: 112 U/L (ref 98–192)

## 2022-04-21 MED ORDER — BORTEZOMIB CHEMO SQ INJECTION 3.5 MG (2.5MG/ML)
1.3000 mg/m2 | Freq: Once | INTRAMUSCULAR | Status: AC
  Administered 2022-04-21: 2.5 mg via SUBCUTANEOUS
  Filled 2022-04-21: qty 1

## 2022-04-21 MED ORDER — ACETAMINOPHEN 325 MG PO TABS: 650.0000 mg | ORAL_TABLET | Freq: Once | ORAL | Status: DC

## 2022-04-21 MED ORDER — DEXAMETHASONE 4 MG PO TABS: 20.0000 mg | ORAL_TABLET | Freq: Once | ORAL | Status: DC

## 2022-04-21 MED ORDER — DARATUMUMAB-HYALURONIDASE-FIHJ 1800-30000 MG-UT/15ML ~~LOC~~ SOLN
1800.0000 mg | Freq: Once | SUBCUTANEOUS | Status: AC
  Administered 2022-04-21: 1800 mg via SUBCUTANEOUS
  Filled 2022-04-21: qty 15

## 2022-04-21 MED ORDER — DIPHENHYDRAMINE HCL 25 MG PO CAPS: 50.0000 mg | ORAL_CAPSULE | Freq: Once | ORAL | Status: DC

## 2022-04-21 NOTE — Progress Notes (Signed)
Hematology and Oncology Follow Up Visit  James Marks [id number] 09/27/1940 82 y.o. 04/21/2022   Principle Diagnosis:  IgA kappa  myeloma  Current Therapy:   Velcade/Revlimid/Decadron -- s/p cycle #6 -- start on 07/27/2021 Faspro/Velcade/Revlimid/Decadron -- s/p cycle #3 - start  on 01/28/2022 --Revlimid  DC'd on 02/24/2022     Interim History:  James Marks is back for follow-up.  He is doing incredibly well with treatment.  Did a 24-hour urine on him.  This was done on 04/07/2022.  The 24-hour urine did not show any monoclonal protein in his blood.  His kappa light chain was 23.3 mg/L.  His serum light chains also come down nicely.  We last checked in March, the Kappa light chain was down to 2.7 mg/dL.  His monoclonal spike in the blood is 0.2 g/dL.  Stopping the Revlimid really helped.  He feels better.  Has more energy.  He is eating well.  He had a wonderful Easter weekend.  He has had no cough or shortness of breath.  There has been no problems with bleeding.  He has had no fever.  He has had no change in bowel or bladder habits.  There has been no leg swelling.  Overall, I would have to say that his performance status is probably ECOG 1.  Medications:  Current Outpatient Medications:    acetaminophen (TYLENOL) 325 MG tablet, Take 650 mg by mouth. 02/10/2022 Takes 650 mg one hour prior to antibody treatment., Disp: , Rfl:    acyclovir (ZOVIRAX) 400 MG tablet, TAKE 1 TABLET(400 MG) BY MOUTH TWICE DAILY, Disp: 60 tablet, Rfl: 3   amLODipine (NORVASC) 2.5 MG tablet, Take 1 tablet (2.5 mg total) by mouth daily., Disp: 90 tablet, Rfl: 3   BABY ASPIRIN PO, Take 81 mg by mouth daily., Disp: , Rfl:    dexamethasone (DECADRON) 4 MG tablet, Take 5 tablets (20 mg) 1 hour prior to chemo treatment., Disp: 20 tablet, Rfl: 2   diphenhydrAMINE (BENADRYL) 25 mg capsule, Take 50 mg by mouth. 02/10/2022 Takes one hour prior to antibody treatment., Disp: , Rfl:    gabapentin (NEURONTIN) 300 MG  capsule, TAKE ONE CAPSULE BY MOUTH DAILY WITH BREAKFAST, 1 CAPSULE WITH DINNER AND 2 CAPSULES AT BEDTIME, Disp: 360 capsule, Rfl: 4   lidocaine-prilocaine (EMLA) cream, Apply to affected area once, Disp: 30 g, Rfl: 3   losartan (COZAAR) 50 MG tablet, TAKE 1 TABLET BY MOUTH DAILY, Disp: 90 tablet, Rfl: 3   montelukast (SINGULAIR) 10 MG tablet, Take 1 tablet (10 mg total) by mouth at bedtime., Disp: 30 tablet, Rfl: 2   Multiple Vitamins-Minerals (PRESERVISION AREDS 2 PO), Take by mouth daily at 6 (six) AM., Disp: , Rfl:    traZODone (DESYREL) 50 MG tablet, Take 1 tablet (50 mg total) by mouth at bedtime., Disp: 30 tablet, Rfl: 3   amoxicillin (AMOXIL) 500 MG capsule, Take four tablets (2000 mg) total one hour prior to dental procedure. (Patient not taking: Reported on 03/25/2022), Disp: 4 capsule, Rfl: 0   ondansetron (ZOFRAN) 8 MG tablet, Take 1 tablet (8 mg total) by mouth every 8 (eight) hours as needed for nausea or vomiting. (Patient not taking: Reported on 04/21/2022), Disp: 30 tablet, Rfl: 1   prochlorperazine (COMPAZINE) 10 MG tablet, Take 1 tablet (10 mg total) by mouth every 6 (six) hours as needed for nausea or vomiting. (Patient not taking: Reported on 04/21/2022), Disp: 30 tablet, Rfl: 1 No current facility-administered medications for this visit.  Facility-Administered Medications  Ordered in Other Visits:    0.9 %  sodium chloride infusion, , Intravenous, Continuous, James Marks, Rose PhiPeter R, MD, Stopped at 08/05/14 1710  Allergies: No Known Allergies  Past Medical History, Surgical history, Social history, and Family History were reviewed and updated.  Review of Systems: Review of Systems  Constitutional:  Positive for malaise/fatigue.  HENT:  Positive for congestion.   Eyes: Negative.   Respiratory:  Positive for shortness of breath.   Cardiovascular:  Positive for palpitations.  Gastrointestinal: Negative.   Genitourinary: Negative.   Musculoskeletal:  Positive for back pain and  myalgias.  Skin: Negative.   Neurological:  Positive for tingling.  Endo/Heme/Allergies: Negative.   Psychiatric/Behavioral: Negative.      Physical Exam:  height is 6\' 2"  (1.88 m) and weight is 172 lb 9.6 oz (78.3 kg). His oral temperature is 97.7 F (36.5 C). His blood pressure is 147/65 (abnormal) and his pulse is 54 (abnormal). His respiration is 18 and oxygen saturation is 100%.   Wt Readings from Last 3 Encounters:  04/21/22 172 lb 9.6 oz (78.3 kg)  03/25/22 166 lb 6.4 oz (75.5 kg)  02/24/22 171 lb 1.3 oz (77.6 kg)     Physical Exam Vitals reviewed.  HENT:     Head: Normocephalic and atraumatic.  Eyes:     Pupils: Pupils are equal, round, and reactive to light.  Cardiovascular:     Rate and Rhythm: Normal rate and regular rhythm.     Heart sounds: Normal heart sounds.  Pulmonary:     Effort: Pulmonary effort is normal.     Breath sounds: Normal breath sounds.  Abdominal:     General: Bowel sounds are normal.     Palpations: Abdomen is soft.  Musculoskeletal:        General: No tenderness or deformity. Normal range of motion.     Cervical back: Normal range of motion.  Lymphadenopathy:     Cervical: No cervical adenopathy.  Skin:    General: Skin is warm and dry.     Findings: No erythema or rash.  Neurological:     Mental Status: He is alert and oriented to person, place, and time.  Psychiatric:        Behavior: Behavior normal.        Thought Content: Thought content normal.        Judgment: Judgment normal.      Lab Results  Component Value Date   WBC 7.0 04/21/2022   HGB 12.3 (L) 04/21/2022   HCT 38.8 (L) 04/21/2022   MCV 95.1 04/21/2022   PLT 170 04/21/2022     Chemistry      Component Value Date/Time   NA 142 04/21/2022 0834   NA 143 12/06/2016 1156   NA 139 08/02/2016 1146   K 4.8 04/21/2022 0834   K 4.2 12/06/2016 1156   K 4.7 08/02/2016 1146   CL 106 04/21/2022 0834   CL 107 12/06/2016 1156   CO2 30 04/21/2022 0834   CO2 28  12/06/2016 1156   CO2 24 08/02/2016 1146   BUN 25 (H) 04/21/2022 0834   BUN 29 (H) 12/06/2016 1156   BUN 34.3 (H) 08/02/2016 1146   CREATININE 1.49 (H) 04/21/2022 0834   CREATININE 2.1 (H) 12/06/2016 1156   CREATININE 1.7 (H) 08/02/2016 1146      Component Value Date/Time   CALCIUM 9.3 04/21/2022 0834   CALCIUM 9.1 12/06/2016 1156   CALCIUM 9.5 08/02/2016 1146   ALKPHOS 113 04/21/2022 0834  ALKPHOS 108 (H) 12/06/2016 1156   ALKPHOS 115 08/02/2016 1146   AST 11 (L) 04/21/2022 0834   AST 14 08/02/2016 1146   ALT 8 04/21/2022 0834   ALT 18 12/06/2016 1156   ALT 9 08/02/2016 1146   BILITOT 0.4 04/21/2022 0834   BILITOT 0.41 08/02/2016 1146      Impression and Plan:  Mr. Haderlie is 82 year old man with IgA kappa myeloma.  He is on fairly aggressive therapy right now.  We had added the Faspro.  I think adding the Faspro clearly helped him out.  His Kappa light chain continues to come down.  I think that the 24-hour urine confirms that he is responding.  He gets treated every 2 weeks.  I think this is working well for him.  Will continue to hold the Revlimid.  I do not see need to restart this right now.  We will plan to get him back to see [place] in 1 month.  He will come back in 2 weeks for treatment.    [name], MD 4/10/20249:26 AM

## 2022-04-21 NOTE — Patient Instructions (Signed)
[facility]  Discharge Instructions: Thank you for choosing [facility] to provide your oncology and hematology care.   If you have a lab appointment with the Cancer Center, please go directly to the Cancer Center and check in at the registration area.  Wear comfortable clothing and clothing appropriate for easy access to any Portacath or PICC line.   We strive to give you quality time with your provider. You may need to reschedule your appointment if you arrive late (15 or more minutes).  Arriving late affects you and other patients whose appointments are after yours.  Also, if you miss three or more appointments without notifying the office, you may be dismissed from the clinic at the provider's discretion.      For prescription refill requests, have your pharmacy contact our office and allow 72 hours for refills to be completed.    Today you received the following chemotherapy and/or immunotherapy agents Velcade/Darzalex      To help prevent nausea and vomiting after your treatment, we encourage you to take your nausea medication as directed.  BELOW ARE SYMPTOMS THAT SHOULD BE REPORTED IMMEDIATELY: *FEVER GREATER THAN 100.4 F (38 C) OR HIGHER *CHILLS OR SWEATING *NAUSEA AND VOMITING THAT IS NOT CONTROLLED WITH YOUR NAUSEA MEDICATION *UNUSUAL SHORTNESS OF BREATH *UNUSUAL BRUISING OR BLEEDING *URINARY PROBLEMS (pain or burning when urinating, or frequent urination) *BOWEL PROBLEMS (unusual diarrhea, constipation, pain near the anus) TENDERNESS IN MOUTH AND THROAT WITH OR WITHOUT PRESENCE OF ULCERS (sore throat, sores in mouth, or a toothache) UNUSUAL RASH, SWELLING OR PAIN  UNUSUAL VAGINAL DISCHARGE OR ITCHING   Items with * indicate a potential emergency and should be followed up as soon as possible or go to the Emergency Department if any problems should occur.  Please show the CHEMOTHERAPY ALERT CARD or IMMUNOTHERAPY ALERT CARD at  check-in to the Emergency Department and triage nurse. Should you have questions after your visit or need to cancel or reschedule your appointment, please contact [facility]  [phone] and follow the prompts.  Office hours are 8:00 a.m. to 4:30 p.m. Monday - Friday. Please note that voicemails left after 4:00 p.m. may not be returned until the following business day.  We are closed weekends and major holidays. You have access to a nurse at all times for urgent questions. Please call the main number to the clinic [phone] and follow the prompts.  For any non-urgent questions, you may also contact your provider using MyChart. We now offer e-Visits for anyone 2 and older to request care online for non-urgent symptoms. For details visit [email or web address].   Also download the MyChart app! Go to the app store, search "MyChart", open the app, select [place], and log in with your MyChart username and password.

## 2022-04-22 LAB — IGG, IGA, IGM
IgA: 122 mg/dL (ref 61–437)
IgG (Immunoglobin G), Serum: 684 mg/dL (ref 603–1613)
IgM (Immunoglobulin M), Srm: 29 mg/dL (ref 15–143)

## 2022-04-22 LAB — KAPPA/LAMBDA LIGHT CHAINS
Kappa free light chain: 34.2 mg/L — ABNORMAL HIGH (ref 3.3–19.4)
Kappa, lambda light chain ratio: 2.41 — ABNORMAL HIGH (ref 0.26–1.65)
Lambda free light chains: 14.2 mg/L (ref 5.7–26.3)

## 2022-04-23 ENCOUNTER — Other Ambulatory Visit: Payer: Self-pay

## 2022-04-23 ENCOUNTER — Encounter: Payer: Self-pay | Admitting: *Deleted

## 2022-04-29 LAB — IMMUNOFIXATION REFLEX, SERUM
IgA: 130 mg/dL (ref 61–437)
IgG (Immunoglobin G), Serum: 718 mg/dL (ref 603–1613)
IgM (Immunoglobulin M), Srm: 33 mg/dL (ref 15–143)

## 2022-04-29 LAB — PROTEIN ELECTROPHORESIS, SERUM, WITH REFLEX
A/G Ratio: 1.5 (ref 0.7–1.7)
Albumin ELP: 3.6 g/dL (ref 2.9–4.4)
Alpha-1-Globulin: 0.2 g/dL (ref 0.0–0.4)
Alpha-2-Globulin: 0.7 g/dL (ref 0.4–1.0)
Beta Globulin: 0.8 g/dL (ref 0.7–1.3)
Gamma Globulin: 0.7 g/dL (ref 0.4–1.8)
Globulin, Total: 2.4 g/dL (ref 2.2–3.9)
M-Spike, %: 0.2 g/dL — ABNORMAL HIGH
SPEP Interpretation: 0
Total Protein ELP: 6 g/dL (ref 6.0–8.5)

## 2022-05-05 ENCOUNTER — Inpatient Hospital Stay: Payer: Medicare Other

## 2022-05-05 VITALS — BP 148/60 | HR 56 | Temp 79.6°F

## 2022-05-05 DIAGNOSIS — Z5112 Encounter for antineoplastic immunotherapy: Secondary | ICD-10-CM | POA: Diagnosis not present

## 2022-05-05 DIAGNOSIS — D472 Monoclonal gammopathy: Secondary | ICD-10-CM

## 2022-05-05 LAB — CBC WITH DIFFERENTIAL (CANCER CENTER ONLY)
Abs Immature Granulocytes: 0.01 10*3/uL (ref 0.00–0.07)
Basophils Absolute: 0 10*3/uL (ref 0.0–0.1)
Basophils Relative: 0 %
Eosinophils Absolute: 0.1 10*3/uL (ref 0.0–0.5)
Eosinophils Relative: 1 %
HCT: 40.1 % (ref 39.0–52.0)
Hemoglobin: 12.9 g/dL — ABNORMAL LOW (ref 13.0–17.0)
Immature Granulocytes: 0 %
Lymphocytes Relative: 23 %
Lymphs Abs: 1.6 10*3/uL (ref 0.7–4.0)
MCH: 30.2 pg (ref 26.0–34.0)
MCHC: 32.2 g/dL (ref 30.0–36.0)
MCV: 93.9 fL (ref 80.0–100.0)
Monocytes Absolute: 0.4 10*3/uL (ref 0.1–1.0)
Monocytes Relative: 6 %
Neutro Abs: 5 10*3/uL (ref 1.7–7.7)
Neutrophils Relative %: 70 %
Platelet Count: 160 10*3/uL (ref 150–400)
RBC: 4.27 MIL/uL (ref 4.22–5.81)
RDW: 14 % (ref 11.5–15.5)
WBC Count: 7.2 10*3/uL (ref 4.0–10.5)
nRBC: 0 % (ref 0.0–0.2)

## 2022-05-05 LAB — CMP (CANCER CENTER ONLY)
ALT: 6 U/L (ref 0–44)
AST: 11 U/L — ABNORMAL LOW (ref 15–41)
Albumin: 4.1 g/dL (ref 3.5–5.0)
Alkaline Phosphatase: 111 U/L (ref 38–126)
Anion gap: 4 — ABNORMAL LOW (ref 5–15)
BUN: 23 mg/dL (ref 8–23)
CO2: 30 mmol/L (ref 22–32)
Calcium: 9.2 mg/dL (ref 8.9–10.3)
Chloride: 110 mmol/L (ref 98–111)
Creatinine: 1.6 mg/dL — ABNORMAL HIGH (ref 0.61–1.24)
GFR, Estimated: 42 mL/min — ABNORMAL LOW (ref >60)
Glucose, Bld: 115 mg/dL — ABNORMAL HIGH (ref 70–99)
Potassium: 5.3 mmol/L — ABNORMAL HIGH (ref 3.5–5.1)
Sodium: 144 mmol/L (ref 135–145)
Total Bilirubin: 0.4 mg/dL (ref 0.3–1.2)
Total Protein: 6.3 g/dL — ABNORMAL LOW (ref 6.5–8.1)

## 2022-05-05 MED ORDER — BORTEZOMIB CHEMO SQ INJECTION 3.5 MG (2.5MG/ML)
1.3000 mg/m2 | Freq: Once | INTRAMUSCULAR | Status: AC
  Administered 2022-05-05: 2.5 mg via SUBCUTANEOUS
  Filled 2022-05-05: qty 1

## 2022-05-05 MED ORDER — DARATUMUMAB-HYALURONIDASE-FIHJ 1800-30000 MG-UT/15ML ~~LOC~~ SOLN
1800.0000 mg | Freq: Once | SUBCUTANEOUS | Status: AC
  Administered 2022-05-05: 1800 mg via SUBCUTANEOUS
  Filled 2022-05-05: qty 15

## 2022-05-05 MED ORDER — DEXAMETHASONE 4 MG PO TABS: 20.0000 mg | ORAL_TABLET | Freq: Once | ORAL | Status: DC

## 2022-05-05 MED ORDER — DIPHENHYDRAMINE HCL 25 MG PO CAPS: 50.0000 mg | ORAL_CAPSULE | Freq: Once | ORAL | Status: DC

## 2022-05-05 MED ORDER — ACETAMINOPHEN 325 MG PO TABS: 650.0000 mg | ORAL_TABLET | Freq: Once | ORAL | Status: DC

## 2022-05-05 NOTE — Progress Notes (Signed)
Ok to treat with Scr 1.6 today.  Anola Gurney [place], [place], BCPS, BCOP 05/05/2022 11:53 AM

## 2022-05-05 NOTE — Progress Notes (Signed)
Reviewed labs with Dr. Myna Hidalgo and pt ok to treat with creatinine 1.60

## 2022-05-05 NOTE — Progress Notes (Deleted)
Ok to treat with Scr 1.6 today.  Anola Gurney [place], [place], BCPS, BCOP 05/05/2022 11:42 AM

## 2022-05-05 NOTE — Patient Instructions (Signed)
[facility]  Discharge Instructions: Thank you for choosing [facility] to provide your oncology and hematology care.   If you have a lab appointment with the Cancer Center, please go directly to the Cancer Center and check in at the registration area.  Wear comfortable clothing and clothing appropriate for easy access to any Portacath or PICC line.   We strive to give you quality time with your provider. You may need to reschedule your appointment if you arrive late (15 or more minutes).  Arriving late affects you and other patients whose appointments are after yours.  Also, if you miss three or more appointments without notifying the office, you may be dismissed from the clinic at the provider's discretion.      For prescription refill requests, have your pharmacy contact our office and allow 72 hours for refills to be completed.    Today you received the following chemotherapy and/or immunotherapy agents Velcade, Faspro    To help prevent nausea and vomiting after your treatment, we encourage you to take your nausea medication as directed.  BELOW ARE SYMPTOMS THAT SHOULD BE REPORTED IMMEDIATELY: *FEVER GREATER THAN 100.4 F (38 C) OR HIGHER *CHILLS OR SWEATING *NAUSEA AND VOMITING THAT IS NOT CONTROLLED WITH YOUR NAUSEA MEDICATION *UNUSUAL SHORTNESS OF BREATH *UNUSUAL BRUISING OR BLEEDING *URINARY PROBLEMS (pain or burning when urinating, or frequent urination) *BOWEL PROBLEMS (unusual diarrhea, constipation, pain near the anus) TENDERNESS IN MOUTH AND THROAT WITH OR WITHOUT PRESENCE OF ULCERS (sore throat, sores in mouth, or a toothache) UNUSUAL RASH, SWELLING OR PAIN  UNUSUAL VAGINAL DISCHARGE OR ITCHING   Items with * indicate a potential emergency and should be followed up as soon as possible or go to the Emergency Department if any problems should occur.  Please show the CHEMOTHERAPY ALERT CARD or IMMUNOTHERAPY ALERT CARD at  check-in to the Emergency Department and triage nurse. Should you have questions after your visit or need to cancel or reschedule your appointment, please contact [facility] [place]  [phone] and follow the prompts.  Office hours are 8:00 a.m. to 4:30 p.m. Monday - Friday. Please note that voicemails left after 4:00 p.m. may not be returned until the following business day.  We are closed weekends and major holidays. You have access to a nurse at all times for urgent questions. Please call the main number to the clinic [phone] and follow the prompts.  For any non-urgent questions, you may also contact your provider using MyChart. We now offer e-Visits for anyone 11 and older to request care online for non-urgent symptoms. For details visit [email or web address].   Also download the MyChart app! Go to the app store, search "MyChart", open the app, select [place], and log in with your MyChart username and password.

## 2022-05-19 ENCOUNTER — Other Ambulatory Visit: Payer: Self-pay

## 2022-05-19 ENCOUNTER — Inpatient Hospital Stay: Payer: Medicare Other | Attending: Hematology & Oncology | Admitting: Hematology & Oncology

## 2022-05-19 ENCOUNTER — Inpatient Hospital Stay: Payer: Medicare Other

## 2022-05-19 ENCOUNTER — Encounter: Payer: Self-pay | Admitting: Hematology & Oncology

## 2022-05-19 ENCOUNTER — Other Ambulatory Visit: Payer: Self-pay | Admitting: Hematology & Oncology

## 2022-05-19 VITALS — BP 133/54 | HR 54 | Temp 97.7°F | Resp 20 | Ht 74.0 in | Wt 173.8 lb

## 2022-05-19 VITALS — BP 143/62 | HR 52 | Resp 18

## 2022-05-19 DIAGNOSIS — D472 Monoclonal gammopathy: Secondary | ICD-10-CM

## 2022-05-19 DIAGNOSIS — Z79899 Other long term (current) drug therapy: Secondary | ICD-10-CM | POA: Diagnosis not present

## 2022-05-19 DIAGNOSIS — M549 Dorsalgia, unspecified: Secondary | ICD-10-CM | POA: Insufficient documentation

## 2022-05-19 DIAGNOSIS — M791 Myalgia, unspecified site: Secondary | ICD-10-CM | POA: Insufficient documentation

## 2022-05-19 DIAGNOSIS — C9000 Multiple myeloma not having achieved remission: Secondary | ICD-10-CM | POA: Diagnosis present

## 2022-05-19 DIAGNOSIS — Z79620 Long term (current) use of immunosuppressive biologic: Secondary | ICD-10-CM | POA: Diagnosis not present

## 2022-05-19 DIAGNOSIS — Z789 Other specified health status: Secondary | ICD-10-CM

## 2022-05-19 DIAGNOSIS — R5383 Other fatigue: Secondary | ICD-10-CM | POA: Insufficient documentation

## 2022-05-19 DIAGNOSIS — R002 Palpitations: Secondary | ICD-10-CM | POA: Insufficient documentation

## 2022-05-19 DIAGNOSIS — R0602 Shortness of breath: Secondary | ICD-10-CM | POA: Insufficient documentation

## 2022-05-19 DIAGNOSIS — Z5112 Encounter for antineoplastic immunotherapy: Secondary | ICD-10-CM | POA: Insufficient documentation

## 2022-05-19 DIAGNOSIS — Z7961 Long term (current) use of immunomodulator: Secondary | ICD-10-CM | POA: Diagnosis not present

## 2022-05-19 DIAGNOSIS — Z7982 Long term (current) use of aspirin: Secondary | ICD-10-CM | POA: Insufficient documentation

## 2022-05-19 LAB — COMPREHENSIVE METABOLIC PANEL
ALT: 7 U/L (ref 0–44)
AST: 11 U/L — ABNORMAL LOW (ref 15–41)
Albumin: 4.3 g/dL (ref 3.5–5.0)
Alkaline Phosphatase: 107 U/L (ref 38–126)
Anion gap: 9 (ref 5–15)
BUN: 27 mg/dL — ABNORMAL HIGH (ref 8–23)
CO2: 28 mmol/L (ref 22–32)
Calcium: 9.1 mg/dL (ref 8.9–10.3)
Chloride: 105 mmol/L (ref 98–111)
Creatinine, Ser: 1.65 mg/dL — ABNORMAL HIGH (ref 0.61–1.24)
GFR, Estimated: 41 mL/min — ABNORMAL LOW (ref >60)
Glucose, Bld: 154 mg/dL — ABNORMAL HIGH (ref 70–99)
Potassium: 4.3 mmol/L (ref 3.5–5.1)
Sodium: 142 mmol/L (ref 135–145)
Total Bilirubin: 0.4 mg/dL (ref 0.3–1.2)
Total Protein: 6.6 g/dL (ref 6.5–8.1)

## 2022-05-19 LAB — CBC WITH DIFFERENTIAL (CANCER CENTER ONLY)
Abs Immature Granulocytes: 0.03 10*3/uL (ref 0.00–0.07)
Basophils Absolute: 0 10*3/uL (ref 0.0–0.1)
Basophils Relative: 0 %
Eosinophils Absolute: 0.1 10*3/uL (ref 0.0–0.5)
Eosinophils Relative: 1 %
HCT: 38.4 % — ABNORMAL LOW (ref 39.0–52.0)
Hemoglobin: 12.3 g/dL — ABNORMAL LOW (ref 13.0–17.0)
Immature Granulocytes: 0 %
Lymphocytes Relative: 18 %
Lymphs Abs: 1.4 10*3/uL (ref 0.7–4.0)
MCH: 30.3 pg (ref 26.0–34.0)
MCHC: 32 g/dL (ref 30.0–36.0)
MCV: 94.6 fL (ref 80.0–100.0)
Monocytes Absolute: 0.4 10*3/uL (ref 0.1–1.0)
Monocytes Relative: 5 %
Neutro Abs: 5.8 10*3/uL (ref 1.7–7.7)
Neutrophils Relative %: 76 %
Platelet Count: 153 10*3/uL (ref 150–400)
RBC: 4.06 MIL/uL — ABNORMAL LOW (ref 4.22–5.81)
RDW: 13.8 % (ref 11.5–15.5)
WBC Count: 7.7 10*3/uL (ref 4.0–10.5)
nRBC: 0 % (ref 0.0–0.2)

## 2022-05-19 LAB — LACTATE DEHYDROGENASE: LDH: 118 U/L (ref 98–192)

## 2022-05-19 MED ORDER — ACETAMINOPHEN 325 MG PO TABS: 650.0000 mg | ORAL_TABLET | Freq: Once | ORAL | Status: DC

## 2022-05-19 MED ORDER — DARATUMUMAB-HYALURONIDASE-FIHJ 1800-30000 MG-UT/15ML ~~LOC~~ SOLN
1800.0000 mg | Freq: Once | SUBCUTANEOUS | Status: AC
  Administered 2022-05-19: 1800 mg via SUBCUTANEOUS
  Filled 2022-05-19: qty 15

## 2022-05-19 MED ORDER — BORTEZOMIB CHEMO SQ INJECTION 3.5 MG (2.5MG/ML)
1.3000 mg/m2 | Freq: Once | INTRAMUSCULAR | Status: AC
  Administered 2022-05-19: 2.5 mg via SUBCUTANEOUS
  Filled 2022-05-19: qty 1

## 2022-05-19 MED ORDER — DEXAMETHASONE 4 MG PO TABS: 20.0000 mg | ORAL_TABLET | Freq: Once | ORAL | Status: DC

## 2022-05-19 MED ORDER — DIPHENHYDRAMINE HCL 25 MG PO CAPS: 50.0000 mg | ORAL_CAPSULE | Freq: Once | ORAL | Status: DC

## 2022-05-19 NOTE — Progress Notes (Signed)
. Hematology and Oncology Follow Up Visit  James Marks [id number] [birth year] 82 y.o. 05/19/2022   Principle Diagnosis:  IgA kappa  myeloma  Current Therapy:   Velcade/Revlimid/Decadron -- s/p cycle #6 -- start on 07/27/2021 Faspro/Velcade/Revlimid/Decadron -- s/p cycle #5 - start  on 01/28/2022 --Revlimid  DC'd on 02/24/2022     Interim History:  James Marks is back for follow-up.  I really think that he is done quite nicely.  There is a response to his protocol has been quite well.  We last saw him, his Kappa light chain was down to 3.4 mg/dL.  The monoclonal spike was 0.2 g/dL.  His IgA level was 135 mg/dL.  He is now been playing golf.  He does have some fatigue.  We stopped the Revlimid.  This did help him feel better.  Again he still has some stamina issues.  He is eating okay.  There is no change in bowel or bladder habits.  He has had no cough or shortness of breath.  There has been no headache.  He has had no blurred vision.  Currently, I would have said that his performance status is probably ECOG 1.     Medications:  Current Outpatient Medications:    acetaminophen (TYLENOL) 325 MG tablet, Take 325 mg by mouth. 02/10/2022 Takes 325 mg one hour prior to antibody treatment., Disp: , Rfl:    acyclovir (ZOVIRAX) 400 MG tablet, TAKE 1 TABLET(400 MG) BY MOUTH TWICE DAILY, Disp: 60 tablet, Rfl: 3   amLODipine (NORVASC) 2.5 MG tablet, Take 1 tablet (2.5 mg total) by mouth daily., Disp: 90 tablet, Rfl: 3   BABY ASPIRIN PO, Take 81 mg by mouth daily., Disp: , Rfl:    dexamethasone (DECADRON) 4 MG tablet, Take 5 tablets (20 mg) 1 hour prior to chemo treatment., Disp: 20 tablet, Rfl: 2   diphenhydrAMINE (BENADRYL) 25 mg capsule, Take 50 mg by mouth. 02/10/2022 Takes one hour prior to antibody treatment., Disp: , Rfl:    gabapentin (NEURONTIN) 300 MG capsule, TAKE ONE CAPSULE BY MOUTH DAILY WITH BREAKFAST, 1 CAPSULE WITH DINNER AND 2 CAPSULES AT BEDTIME, Disp: 360 capsule, Rfl: 4    losartan (COZAAR) 50 MG tablet, TAKE 1 TABLET BY MOUTH DAILY, Disp: 90 tablet, Rfl: 3   montelukast (SINGULAIR) 10 MG tablet, Take 1 tablet (10 mg total) by mouth at bedtime., Disp: 30 tablet, Rfl: 2   Multiple Vitamins-Minerals (PRESERVISION AREDS 2 PO), Take by mouth daily at 6 (six) AM., Disp: , Rfl:    amoxicillin (AMOXIL) 500 MG capsule, Take four tablets (2000 mg) total one hour prior to dental procedure. (Patient not taking: Reported on 03/25/2022), Disp: 4 capsule, Rfl: 0   ondansetron (ZOFRAN) 8 MG tablet, Take 1 tablet (8 mg total) by mouth every 8 (eight) hours as needed for nausea or vomiting. (Patient not taking: Reported on 04/21/2022), Disp: 30 tablet, Rfl: 1   prochlorperazine (COMPAZINE) 10 MG tablet, Take 1 tablet (10 mg total) by mouth every 6 (six) hours as needed for nausea or vomiting. (Patient not taking: Reported on 04/21/2022), Disp: 30 tablet, Rfl: 1   traZODone (DESYREL) 50 MG tablet, Take 1 tablet (50 mg total) by mouth at bedtime. (Patient not taking: Reported on 05/19/2022), Disp: 30 tablet, Rfl: 3 No current facility-administered medications for this visit.  Facility-Administered Medications Ordered in Other Visits:    0.9 %  sodium chloride infusion, , Intravenous, Continuous, Vertie Dibbern, Rose Phi, MD, Stopped at 08/05/14 1710  Allergies: No Known Allergies  Past Medical History, Surgical history, Social history, and Family History were reviewed and updated.  Review of Systems: Review of Systems  Constitutional:  Positive for malaise/fatigue.  HENT:  Positive for congestion.   Eyes: Negative.   Respiratory:  Positive for shortness of breath.   Cardiovascular:  Positive for palpitations.  Gastrointestinal: Negative.   Genitourinary: Negative.   Musculoskeletal:  Positive for back pain and myalgias.  Skin: Negative.   Neurological:  Positive for tingling.  Endo/Heme/Allergies: Negative.   Psychiatric/Behavioral: Negative.      Physical Exam:  height is 6\' 2"   (1.88 m) and weight is 173 lb 12.8 oz (78.8 kg). His oral temperature is 97.7 F (36.5 C). His blood pressure is 133/54 (abnormal) and his pulse is 54 (abnormal). His respiration is 20 and oxygen saturation is 97%.   Wt Readings from Last 3 Encounters:  05/19/22 173 lb 12.8 oz (78.8 kg)  04/21/22 172 lb 9.6 oz (78.3 kg)  03/25/22 166 lb 6.4 oz (75.5 kg)     Physical Exam Vitals reviewed.  HENT:     Head: Normocephalic and atraumatic.  Eyes:     Pupils: Pupils are equal, round, and reactive to light.  Cardiovascular:     Rate and Rhythm: Normal rate and regular rhythm.     Heart sounds: Normal heart sounds.  Pulmonary:     Effort: Pulmonary effort is normal.     Breath sounds: Normal breath sounds.  Abdominal:     General: Bowel sounds are normal.     Palpations: Abdomen is soft.  Musculoskeletal:        General: No tenderness or deformity. Normal range of motion.     Cervical back: Normal range of motion.  Lymphadenopathy:     Cervical: No cervical adenopathy.  Skin:    General: Skin is warm and dry.     Findings: No erythema or rash.  Neurological:     Mental Status: He is alert and oriented to person, place, and time.  Psychiatric:        Behavior: Behavior normal.        Thought Content: Thought content normal.        Judgment: Judgment normal.      Lab Results  Component Value Date   WBC 7.7 05/19/2022   HGB 12.3 (L) 05/19/2022   HCT 38.4 (L) 05/19/2022   MCV 94.6 05/19/2022   PLT 153 05/19/2022     Chemistry      Component Value Date/Time   NA 142 05/19/2022 0908   NA 143 12/06/2016 1156   NA 139 08/02/2016 1146   K 4.3 05/19/2022 0908   K 4.2 12/06/2016 1156   K 4.7 08/02/2016 1146   CL 105 05/19/2022 0908   CL 107 12/06/2016 1156   CO2 28 05/19/2022 0908   CO2 28 12/06/2016 1156   CO2 24 08/02/2016 1146   BUN 27 (H) 05/19/2022 0908   BUN 29 (H) 12/06/2016 1156   BUN 34.3 (H) 08/02/2016 1146   CREATININE 1.65 (H) 05/19/2022 0908   CREATININE  1.60 (H) 05/05/2022 1104   CREATININE 2.1 (H) 12/06/2016 1156   CREATININE 1.7 (H) 08/02/2016 1146      Component Value Date/Time   CALCIUM 9.1 05/19/2022 0908   CALCIUM 9.1 12/06/2016 1156   CALCIUM 9.5 08/02/2016 1146   ALKPHOS 107 05/19/2022 0908   ALKPHOS 108 (H) 12/06/2016 1156   ALKPHOS 115 08/02/2016 1146   AST 11 (L) 05/19/2022 0908   AST 11 (  L) 05/05/2022 1104   AST 14 08/02/2016 1146   ALT 7 05/19/2022 0908   ALT 6 05/05/2022 1104   ALT 18 12/06/2016 1156   ALT 9 08/02/2016 1146   BILITOT 0.4 05/19/2022 0908   BILITOT 0.4 05/05/2022 1104   BILITOT 0.41 08/02/2016 1146      Impression and Plan:  Mr. Rotman is 82 year old man with IgA kappa myeloma.  He is on fairly aggressive therapy right now.  We had added the Faspro.  I think adding the Faspro clearly helped him out.  His Kappa light chain has responded nicely.  We will see what today's level is.  I think if he continues to maintain stability, we might be able to go out every 3 weeks.  I just want to continue to make sure that he is responding.  I want to make sure that his quality of life is where he would like it to be.  That typically includes him playing golf.  Hopefully, if we can decrease the frequency of dosing, he will be able to play golf.  For right now, he will continue to have treatment every 2 weeks.  I will plan to see him back in 1 month.  [name], MD 5/8/202410:10 AM

## 2022-05-19 NOTE — Progress Notes (Signed)
OK to treat with creatinine level from today per Dr. Ennever. 

## 2022-05-19 NOTE — Patient Instructions (Signed)
[facility]  Discharge Instructions: Thank you for choosing [facility] to provide your oncology and hematology care.   If you have a lab appointment with the Cancer Center, please go directly to the Cancer Center and check in at the registration area.  Wear comfortable clothing and clothing appropriate for easy access to any Portacath or PICC line.   We strive to give you quality time with your provider. You may need to reschedule your appointment if you arrive late (15 or more minutes).  Arriving late affects you and other patients whose appointments are after yours.  Also, if you miss three or more appointments without notifying the office, you may be dismissed from the clinic at the provider's discretion.      For prescription refill requests, have your pharmacy contact our office and allow 72 hours for refills to be completed.    Today you received the following chemotherapy and/or immunotherapy agents velcade/Darzalex.      To help prevent nausea and vomiting after your treatment, we encourage you to take your nausea medication as directed.  BELOW ARE SYMPTOMS THAT SHOULD BE REPORTED IMMEDIATELY: *FEVER GREATER THAN 100.4 F (38 C) OR HIGHER *CHILLS OR SWEATING *NAUSEA AND VOMITING THAT IS NOT CONTROLLED WITH YOUR NAUSEA MEDICATION *UNUSUAL SHORTNESS OF BREATH *UNUSUAL BRUISING OR BLEEDING *URINARY PROBLEMS (pain or burning when urinating, or frequent urination) *BOWEL PROBLEMS (unusual diarrhea, constipation, pain near the anus) TENDERNESS IN MOUTH AND THROAT WITH OR WITHOUT PRESENCE OF ULCERS (sore throat, sores in mouth, or a toothache) UNUSUAL RASH, SWELLING OR PAIN  UNUSUAL VAGINAL DISCHARGE OR ITCHING   Items with * indicate a potential emergency and should be followed up as soon as possible or go to the Emergency Department if any problems should occur.  Please show the CHEMOTHERAPY ALERT CARD or IMMUNOTHERAPY ALERT CARD at  check-in to the Emergency Department and triage nurse. Should you have questions after your visit or need to cancel or reschedule your appointment, please contact [facility]  [phone] and follow the prompts.  Office hours are 8:00 a.m. to 4:30 p.m. Monday - Friday. Please note that voicemails left after 4:00 p.m. may not be returned until the following business day.  We are closed weekends and major holidays. You have access to a nurse at all times for urgent questions. Please call the main number to the clinic [phone] and follow the prompts.  For any non-urgent questions, you may also contact your provider using MyChart. We now offer e-Visits for anyone 77 and older to request care online for non-urgent symptoms. For details visit [email or web address].   Also download the MyChart app! Go to the app store, search "MyChart", open the app, select , and log in with your MyChart username and password.

## 2022-05-20 ENCOUNTER — Other Ambulatory Visit: Payer: Self-pay

## 2022-05-20 ENCOUNTER — Encounter: Payer: Self-pay | Admitting: Hematology & Oncology

## 2022-05-21 ENCOUNTER — Encounter: Payer: Self-pay | Admitting: *Deleted

## 2022-05-21 LAB — IGG, IGA, IGM
IgA: 115 mg/dL (ref 61–437)
IgG (Immunoglobin G), Serum: 624 mg/dL (ref 603–1613)
IgM (Immunoglobulin M), Srm: 26 mg/dL (ref 15–143)

## 2022-05-21 LAB — KAPPA/LAMBDA LIGHT CHAINS
Kappa free light chain: 32.6 mg/L — ABNORMAL HIGH (ref 3.3–19.4)
Kappa, lambda light chain ratio: 2.57 — ABNORMAL HIGH (ref 0.26–1.65)
Lambda free light chains: 12.7 mg/L (ref 5.7–26.3)

## 2022-05-26 ENCOUNTER — Other Ambulatory Visit: Payer: Self-pay

## 2022-05-26 LAB — IMMUNOFIXATION ELECTROPHORESIS
IgA: 124 mg/dL (ref 61–437)
IgG (Immunoglobin G), Serum: 652 mg/dL (ref 603–1613)
IgM (Immunoglobulin M), Srm: 29 mg/dL (ref 15–143)
Total Protein ELP: 6.2 g/dL (ref 6.0–8.5)

## 2022-05-28 LAB — PROTEIN ELECTROPHORESIS, SERUM, WITH REFLEX
A/G Ratio: 1.8 — ABNORMAL HIGH (ref 0.7–1.7)
Albumin ELP: 3.7 g/dL (ref 2.9–4.4)
Alpha-1-Globulin: 0.2 g/dL (ref 0.0–0.4)
Alpha-2-Globulin: 0.6 g/dL (ref 0.4–1.0)
Beta Globulin: 0.8 g/dL (ref 0.7–1.3)
Gamma Globulin: 0.5 g/dL (ref 0.4–1.8)
Globulin, Total: 2.1 g/dL — ABNORMAL LOW (ref 2.2–3.9)
M-Spike, %: 0.2 g/dL — ABNORMAL HIGH
SPEP Interpretation: 0
Total Protein ELP: 5.8 g/dL — ABNORMAL LOW (ref 6.0–8.5)

## 2022-05-28 LAB — IMMUNOFIXATION REFLEX, SERUM
IgA: 120 mg/dL (ref 61–437)
IgG (Immunoglobin G), Serum: 751 mg/dL (ref 603–1613)
IgM (Immunoglobulin M), Srm: 36 mg/dL (ref 15–143)

## 2022-05-31 ENCOUNTER — Other Ambulatory Visit: Payer: Self-pay | Admitting: Hematology & Oncology

## 2022-06-02 ENCOUNTER — Inpatient Hospital Stay: Payer: Medicare Other

## 2022-06-02 VITALS — BP 130/68 | HR 54 | Temp 97.9°F | Resp 18

## 2022-06-02 DIAGNOSIS — D472 Monoclonal gammopathy: Secondary | ICD-10-CM

## 2022-06-02 DIAGNOSIS — Z5112 Encounter for antineoplastic immunotherapy: Secondary | ICD-10-CM | POA: Diagnosis not present

## 2022-06-02 LAB — CMP (CANCER CENTER ONLY)
ALT: 9 U/L (ref 0–44)
AST: 12 U/L — ABNORMAL LOW (ref 15–41)
Albumin: 4.3 g/dL (ref 3.5–5.0)
Alkaline Phosphatase: 110 U/L (ref 38–126)
Anion gap: 6 (ref 5–15)
BUN: 21 mg/dL (ref 8–23)
CO2: 27 mmol/L (ref 22–32)
Calcium: 9.2 mg/dL (ref 8.9–10.3)
Chloride: 107 mmol/L (ref 98–111)
Creatinine: 1.5 mg/dL — ABNORMAL HIGH (ref 0.61–1.24)
GFR, Estimated: 46 mL/min — ABNORMAL LOW (ref >60)
Glucose, Bld: 131 mg/dL — ABNORMAL HIGH (ref 70–99)
Potassium: 4.5 mmol/L (ref 3.5–5.1)
Sodium: 140 mmol/L (ref 135–145)
Total Bilirubin: 0.3 mg/dL (ref 0.3–1.2)
Total Protein: 6.3 g/dL — ABNORMAL LOW (ref 6.5–8.1)

## 2022-06-02 LAB — CBC WITH DIFFERENTIAL (CANCER CENTER ONLY)
Abs Immature Granulocytes: 0.02 10*3/uL (ref 0.00–0.07)
Basophils Absolute: 0 10*3/uL (ref 0.0–0.1)
Basophils Relative: 0 %
Eosinophils Absolute: 0.1 10*3/uL (ref 0.0–0.5)
Eosinophils Relative: 1 %
HCT: 39.8 % (ref 39.0–52.0)
Hemoglobin: 12.6 g/dL — ABNORMAL LOW (ref 13.0–17.0)
Immature Granulocytes: 0 %
Lymphocytes Relative: 19 %
Lymphs Abs: 1.4 10*3/uL (ref 0.7–4.0)
MCH: 30 pg (ref 26.0–34.0)
MCHC: 31.7 g/dL (ref 30.0–36.0)
MCV: 94.8 fL (ref 80.0–100.0)
Monocytes Absolute: 0.4 10*3/uL (ref 0.1–1.0)
Monocytes Relative: 5 %
Neutro Abs: 5.4 10*3/uL (ref 1.7–7.7)
Neutrophils Relative %: 75 %
Platelet Count: 167 10*3/uL (ref 150–400)
RBC: 4.2 MIL/uL — ABNORMAL LOW (ref 4.22–5.81)
RDW: 13.5 % (ref 11.5–15.5)
WBC Count: 7.2 10*3/uL (ref 4.0–10.5)
nRBC: 0 % (ref 0.0–0.2)

## 2022-06-02 MED ORDER — ACETAMINOPHEN 325 MG PO TABS: 650.0000 mg | ORAL_TABLET | Freq: Once | ORAL | Status: DC

## 2022-06-02 MED ORDER — DIPHENHYDRAMINE HCL 25 MG PO CAPS: 50.0000 mg | ORAL_CAPSULE | Freq: Once | ORAL | Status: DC

## 2022-06-02 MED ORDER — BORTEZOMIB CHEMO SQ INJECTION 3.5 MG (2.5MG/ML)
1.3000 mg/m2 | Freq: Once | INTRAMUSCULAR | Status: AC
  Administered 2022-06-02: 2.5 mg via SUBCUTANEOUS
  Filled 2022-06-02: qty 1

## 2022-06-02 MED ORDER — DEXAMETHASONE 4 MG PO TABS: 20.0000 mg | ORAL_TABLET | Freq: Once | ORAL | Status: DC

## 2022-06-02 MED ORDER — DARATUMUMAB-HYALURONIDASE-FIHJ 1800-30000 MG-UT/15ML ~~LOC~~ SOLN
1800.0000 mg | Freq: Once | SUBCUTANEOUS | Status: AC
  Administered 2022-06-02: 1800 mg via SUBCUTANEOUS
  Filled 2022-06-02: qty 15

## 2022-06-02 NOTE — Patient Instructions (Signed)
[facility]  Discharge Instructions: Thank you for choosing [facility] to provide your oncology and hematology care.   If you have a lab appointment with the Cancer Center, please go directly to the Cancer Center and check in at the registration area.  Wear comfortable clothing and clothing appropriate for easy access to any Portacath or PICC line.   We strive to give you quality time with your provider. You may need to reschedule your appointment if you arrive late (15 or more minutes).  Arriving late affects you and other patients whose appointments are after yours.  Also, if you miss three or more appointments without notifying the office, you may be dismissed from the clinic at the provider's discretion.      For prescription refill requests, have your pharmacy contact our office and allow 72 hours for refills to be completed.    Today you received the following chemotherapy and/or immunotherapy agents Velcade, Faspro    To help prevent nausea and vomiting after your treatment, we encourage you to take your nausea medication as directed.  BELOW ARE SYMPTOMS THAT SHOULD BE REPORTED IMMEDIATELY: *FEVER GREATER THAN 100.4 F (38 C) OR HIGHER *CHILLS OR SWEATING *NAUSEA AND VOMITING THAT IS NOT CONTROLLED WITH YOUR NAUSEA MEDICATION *UNUSUAL SHORTNESS OF BREATH *UNUSUAL BRUISING OR BLEEDING *URINARY PROBLEMS (pain or burning when urinating, or frequent urination) *BOWEL PROBLEMS (unusual diarrhea, constipation, pain near the anus) TENDERNESS IN MOUTH AND THROAT WITH OR WITHOUT PRESENCE OF ULCERS (sore throat, sores in mouth, or a toothache) UNUSUAL RASH, SWELLING OR PAIN  UNUSUAL VAGINAL DISCHARGE OR ITCHING   Items with * indicate a potential emergency and should be followed up as soon as possible or go to the Emergency Department if any problems should occur.  Please show the CHEMOTHERAPY ALERT CARD or IMMUNOTHERAPY ALERT CARD at  check-in to the Emergency Department and triage nurse. Should you have questions after your visit or need to cancel or reschedule your appointment, please contact [facility]  [phone] and follow the prompts.  Office hours are 8:00 a.m. to 4:30 p.m. Monday - Friday. Please note that voicemails left after 4:00 p.m. may not be returned until the following business day.  We are closed weekends and major holidays. You have access to a nurse at all times for urgent questions. Please call the main number to the clinic [phone] and follow the prompts.  For any non-urgent questions, you may also contact your provider using MyChart. We now offer e-Visits for anyone 18 and older to request care online for non-urgent symptoms. For details visit [email or web address].   Also download the MyChart app! Go to the app store, search "MyChart", open the app, select [facility], and log in with your MyChart username and password.  

## 2022-06-10 ENCOUNTER — Telehealth: Payer: Self-pay | Admitting: *Deleted

## 2022-06-10 NOTE — Telephone Encounter (Signed)
Message received from patient's wife, James Marks stating that pt has a "new sudden onset of severe pain to his right leg and hip" and would like for Dr Myna Hidalgo to send in something for pain for him d/t they are out of town at this time. Dr. Myna Hidalgo notified and would like for pt to go to the ER now.  Call placed back to James Marks and James Marks notified per order of Dr. Myna Hidalgo to take pt to the ER now.  James Marks states that pt is refusing to go to the ER at this time d/t they are at [facility].  James Marks again encouraged to take pt to the ER.  James Marks states that she will "work on" patient to get him to agree to going to an ER and will call us back with updates.

## 2022-06-16 ENCOUNTER — Inpatient Hospital Stay: Payer: Medicare Other

## 2022-06-16 ENCOUNTER — Other Ambulatory Visit: Payer: Self-pay

## 2022-06-16 ENCOUNTER — Inpatient Hospital Stay: Payer: Medicare Other | Attending: Hematology & Oncology

## 2022-06-16 ENCOUNTER — Inpatient Hospital Stay (HOSPITAL_BASED_OUTPATIENT_CLINIC_OR_DEPARTMENT_OTHER): Payer: Medicare Other | Admitting: Hematology & Oncology

## 2022-06-16 ENCOUNTER — Encounter: Payer: Self-pay | Admitting: Hematology & Oncology

## 2022-06-16 VITALS — BP 144/63 | HR 68 | Temp 97.8°F | Resp 16 | Ht 74.0 in | Wt 173.0 lb

## 2022-06-16 DIAGNOSIS — C9000 Multiple myeloma not having achieved remission: Secondary | ICD-10-CM | POA: Insufficient documentation

## 2022-06-16 DIAGNOSIS — R0602 Shortness of breath: Secondary | ICD-10-CM | POA: Insufficient documentation

## 2022-06-16 DIAGNOSIS — Z7982 Long term (current) use of aspirin: Secondary | ICD-10-CM | POA: Insufficient documentation

## 2022-06-16 DIAGNOSIS — D472 Monoclonal gammopathy: Secondary | ICD-10-CM | POA: Diagnosis not present

## 2022-06-16 DIAGNOSIS — R5383 Other fatigue: Secondary | ICD-10-CM | POA: Diagnosis not present

## 2022-06-16 DIAGNOSIS — Z5112 Encounter for antineoplastic immunotherapy: Secondary | ICD-10-CM | POA: Diagnosis present

## 2022-06-16 DIAGNOSIS — F17210 Nicotine dependence, cigarettes, uncomplicated: Secondary | ICD-10-CM | POA: Diagnosis not present

## 2022-06-16 DIAGNOSIS — Z79899 Other long term (current) drug therapy: Secondary | ICD-10-CM | POA: Diagnosis not present

## 2022-06-16 LAB — CBC WITH DIFFERENTIAL (CANCER CENTER ONLY)
Abs Immature Granulocytes: 0.06 10*3/uL (ref 0.00–0.07)
Basophils Absolute: 0 10*3/uL (ref 0.0–0.1)
Basophils Relative: 0 %
Eosinophils Absolute: 0 10*3/uL (ref 0.0–0.5)
Eosinophils Relative: 0 %
HCT: 41.5 % (ref 39.0–52.0)
Hemoglobin: 13.3 g/dL (ref 13.0–17.0)
Immature Granulocytes: 1 %
Lymphocytes Relative: 7 %
Lymphs Abs: 0.7 10*3/uL (ref 0.7–4.0)
MCH: 29.9 pg (ref 26.0–34.0)
MCHC: 32 g/dL (ref 30.0–36.0)
MCV: 93.3 fL (ref 80.0–100.0)
Monocytes Absolute: 0.1 10*3/uL (ref 0.1–1.0)
Monocytes Relative: 1 %
Neutro Abs: 9.3 10*3/uL — ABNORMAL HIGH (ref 1.7–7.7)
Neutrophils Relative %: 91 %
Platelet Count: 180 10*3/uL (ref 150–400)
RBC: 4.45 MIL/uL (ref 4.22–5.81)
RDW: 13.2 % (ref 11.5–15.5)
WBC Count: 10.1 10*3/uL (ref 4.0–10.5)
nRBC: 0 % (ref 0.0–0.2)

## 2022-06-16 LAB — CMP (CANCER CENTER ONLY)
ALT: 7 U/L (ref 0–44)
AST: 11 U/L — ABNORMAL LOW (ref 15–41)
Albumin: 4.5 g/dL (ref 3.5–5.0)
Alkaline Phosphatase: 99 U/L (ref 38–126)
Anion gap: 4 — ABNORMAL LOW (ref 5–15)
BUN: 36 mg/dL — ABNORMAL HIGH (ref 8–23)
CO2: 29 mmol/L (ref 22–32)
Calcium: 9.3 mg/dL (ref 8.9–10.3)
Chloride: 106 mmol/L (ref 98–111)
Creatinine: 1.61 mg/dL — ABNORMAL HIGH (ref 0.61–1.24)
GFR, Estimated: 42 mL/min — ABNORMAL LOW (ref >60)
Glucose, Bld: 202 mg/dL — ABNORMAL HIGH (ref 70–99)
Potassium: 5.1 mmol/L (ref 3.5–5.1)
Sodium: 139 mmol/L (ref 135–145)
Total Bilirubin: 0.4 mg/dL (ref 0.3–1.2)
Total Protein: 6.9 g/dL (ref 6.5–8.1)

## 2022-06-16 LAB — LACTATE DEHYDROGENASE: LDH: 139 U/L (ref 98–192)

## 2022-06-16 MED ORDER — DEXAMETHASONE 4 MG PO TABS: 20.0000 mg | ORAL_TABLET | Freq: Once | ORAL | Status: DC

## 2022-06-16 MED ORDER — DARATUMUMAB-HYALURONIDASE-FIHJ 1800-30000 MG-UT/15ML ~~LOC~~ SOLN
1800.0000 mg | Freq: Once | SUBCUTANEOUS | Status: AC
  Administered 2022-06-16: 1800 mg via SUBCUTANEOUS
  Filled 2022-06-16: qty 15

## 2022-06-16 MED ORDER — DIPHENHYDRAMINE HCL 25 MG PO CAPS: 50.0000 mg | ORAL_CAPSULE | Freq: Once | ORAL | Status: DC

## 2022-06-16 MED ORDER — BORTEZOMIB CHEMO SQ INJECTION 3.5 MG (2.5MG/ML)
1.3000 mg/m2 | Freq: Once | INTRAMUSCULAR | Status: AC
  Administered 2022-06-16: 2.5 mg via SUBCUTANEOUS
  Filled 2022-06-16: qty 1

## 2022-06-16 MED ORDER — TRAMADOL HCL 50 MG PO TABS: 50.0000 mg | ORAL_TABLET | Freq: Four times a day (QID) | ORAL | 0 refills | Status: DC | PRN

## 2022-06-16 MED ORDER — ACETAMINOPHEN 325 MG PO TABS: 650.0000 mg | ORAL_TABLET | Freq: Once | ORAL | Status: DC

## 2022-06-16 NOTE — Progress Notes (Signed)
. Hematology and Oncology Follow Up Visit  James Marks [id number] October 15, 1940 82 y.o. 06/16/2022   Principle Diagnosis:  IgA kappa  myeloma  Current Therapy:   Velcade/Revlimid/Decadron -- s/p cycle #6 -- start on 07/27/2021 Faspro/Velcade/Revlimid/Decadron -- s/p cycle #6 - start  on 01/28/2022 --Revlimid  DC'd on 02/24/2022     Interim History:  James Marks is back for follow-up.  Unfortunately, he was having problems when he and his wife went to the beach.  He began to have pain in the right side and down the right thigh.  There was no trauma.  He did not fall.  He actually went to an urgent care for Orthopedics.  He said he had x-rays done which were unremarkable.  He said that steroids seem to help.  He has a prednisone at home which she took which helped.  The Decadron that he takes with the chemotherapy also has helped.  Only sounds like this might be a back issue.  Will get an MRI of the lumbar spine to see what might be going on.  I would not think this is from the myeloma but again, it is possible.  His myeloma numbers have always looked good.  His last Kappa light chain was down to 3.2 mg/dL.  The last monoclonal spike was 0.2 g/dL.  His IgA level was 120 mg/dL.  His appetite is good.  He still smoking.  He has had no problems with bowels or bladder.  Overall, I would say his performance status is probably ECOG 1.       Medications:  Current Outpatient Medications:    acetaminophen (TYLENOL) 325 MG tablet, Take 325 mg by mouth. 02/10/2022 Takes 325 mg one hour prior to antibody treatment., Disp: , Rfl:    acyclovir (ZOVIRAX) 400 MG tablet, TAKE 1 TABLET(400 MG) BY MOUTH TWICE DAILY, Disp: 180 tablet, Rfl: 4   amLODipine (NORVASC) 2.5 MG tablet, Take 1 tablet (2.5 mg total) by mouth daily., Disp: 90 tablet, Rfl: 3   amoxicillin (AMOXIL) 500 MG capsule, Take four tablets (2000 mg) total one hour prior to dental procedure. (Patient not taking: Reported on 03/25/2022), Disp:  4 capsule, Rfl: 0   BABY ASPIRIN PO, Take 81 mg by mouth daily., Disp: , Rfl:    dexamethasone (DECADRON) 4 MG tablet, TAKE 5 TABLETS BY MOUTH 1 HOUR PRIOR TO CHEMO TREATMENT, Disp: 450 tablet, Rfl: 0   diphenhydrAMINE (BENADRYL) 25 mg capsule, Take 50 mg by mouth. 02/10/2022 Takes one hour prior to antibody treatment., Disp: , Rfl:    gabapentin (NEURONTIN) 300 MG capsule, TAKE 1 CAPSULE BY MOUTH EVERY DAY WITH BREAKFAST, 1 CAPSULE WITH DINNER AND 2 CAPSULES AT BEDTIME, Disp: 360 capsule, Rfl: 4   losartan (COZAAR) 50 MG tablet, TAKE 1 TABLET BY MOUTH DAILY, Disp: 90 tablet, Rfl: 3   montelukast (SINGULAIR) 10 MG tablet, Take 1 tablet (10 mg total) by mouth at bedtime., Disp: 30 tablet, Rfl: 2   Multiple Vitamins-Minerals (PRESERVISION AREDS 2 PO), Take by mouth daily at 6 (six) AM., Disp: , Rfl:    ondansetron (ZOFRAN) 8 MG tablet, Take 1 tablet (8 mg total) by mouth every 8 (eight) hours as needed for nausea or vomiting. (Patient not taking: Reported on 04/21/2022), Disp: 30 tablet, Rfl: 1   prochlorperazine (COMPAZINE) 10 MG tablet, Take 1 tablet (10 mg total) by mouth every 6 (six) hours as needed for nausea or vomiting. (Patient not taking: Reported on 04/21/2022), Disp: 30 tablet, Rfl: 1  traZODone (DESYREL) 50 MG tablet, Take 1 tablet (50 mg total) by mouth at bedtime. (Patient not taking: Reported on 05/19/2022), Disp: 30 tablet, Rfl: 3 No current facility-administered medications for this visit.  Facility-Administered Medications Ordered in Other Visits:    0.9 %  sodium chloride infusion, , Intravenous, Continuous, Garald Rhew, Rose Phi, MD, Stopped at 08/05/14 1710  Allergies: No Known Allergies  Past Medical History, Surgical history, Social history, and Family History were reviewed and updated.  Review of Systems: Review of Systems  Constitutional:  Positive for malaise/fatigue.  HENT:  Positive for congestion.   Eyes: Negative.   Respiratory:  Positive for shortness of breath.    Cardiovascular:  Positive for palpitations.  Gastrointestinal: Negative.   Genitourinary: Negative.   Musculoskeletal:  Positive for back pain and myalgias.  Skin: Negative.   Neurological:  Positive for tingling.  Endo/Heme/Allergies: Negative.   Psychiatric/Behavioral: Negative.      Physical Exam:  height is 6\' 2"  (1.88 m) and weight is 173 lb (78.5 kg). His oral temperature is 97.8 F (36.6 C). His blood pressure is 144/63 (abnormal) and his pulse is 68. His respiration is 16 and oxygen saturation is 100%.   Wt Readings from Last 3 Encounters:  06/16/22 173 lb (78.5 kg)  05/19/22 173 lb 12.8 oz (78.8 kg)  04/21/22 172 lb 9.6 oz (78.3 kg)     Physical Exam Vitals reviewed.  HENT:     Head: Normocephalic and atraumatic.  Eyes:     Pupils: Pupils are equal, round, and reactive to light.  Cardiovascular:     Rate and Rhythm: Normal rate and regular rhythm.     Heart sounds: Normal heart sounds.  Pulmonary:     Effort: Pulmonary effort is normal.     Breath sounds: Normal breath sounds.  Abdominal:     General: Bowel sounds are normal.     Palpations: Abdomen is soft.  Musculoskeletal:        General: No tenderness or deformity. Normal range of motion.     Cervical back: Normal range of motion.  Lymphadenopathy:     Cervical: No cervical adenopathy.  Skin:    General: Skin is warm and dry.     Findings: No erythema or rash.  Neurological:     Mental Status: He is alert and oriented to person, place, and time.  Psychiatric:        Behavior: Behavior normal.        Thought Content: Thought content normal.        Judgment: Judgment normal.      Lab Results  Component Value Date   WBC 10.1 06/16/2022   HGB 13.3 06/16/2022   HCT 41.5 06/16/2022   MCV 93.3 06/16/2022   PLT 180 06/16/2022     Chemistry      Component Value Date/Time   NA 140 06/02/2022 1057   NA 143 12/06/2016 1156   NA 139 08/02/2016 1146   K 4.5 06/02/2022 1057   K 4.2 12/06/2016 1156    K 4.7 08/02/2016 1146   CL 107 06/02/2022 1057   CL 107 12/06/2016 1156   CO2 27 06/02/2022 1057   CO2 28 12/06/2016 1156   CO2 24 08/02/2016 1146   BUN 21 06/02/2022 1057   BUN 29 (H) 12/06/2016 1156   BUN 34.3 (H) 08/02/2016 1146   CREATININE 1.50 (H) 06/02/2022 1057   CREATININE 2.1 (H) 12/06/2016 1156   CREATININE 1.7 (H) 08/02/2016 1146  Component Value Date/Time   CALCIUM 9.2 06/02/2022 1057   CALCIUM 9.1 12/06/2016 1156   CALCIUM 9.5 08/02/2016 1146   ALKPHOS 110 06/02/2022 1057   ALKPHOS 108 (H) 12/06/2016 1156   ALKPHOS 115 08/02/2016 1146   AST 12 (L) 06/02/2022 1057   AST 14 08/02/2016 1146   ALT 9 06/02/2022 1057   ALT 18 12/06/2016 1156   ALT 9 08/02/2016 1146   BILITOT 0.3 06/02/2022 1057   BILITOT 0.41 08/02/2016 1146      Impression and Plan:  Mr. Krus is 82 year old man with IgA kappa myeloma.  He is on fairly aggressive therapy right now.  We had added the Faspro.  I think adding the Faspro clearly helped him out.  His Kappa light chain has responded nicely.  We will see what today's level is.  We will get another 24-hour urine on him to see how this looks.  Last time he had the 24-hour urine his kappa light chain was 23 mg/L.  We will get an MRI of the back.  Again I am not sure what might be going on.  I will know if this might be something that is a disc issue.  It might be a hip issue.  He may need to have a steroid injection to the right hip to see if this may help.  Overall, I am just happy that he is responding to treatment.  We will continue him on therapy.  If everything continues to look fine with respect to the 24-hour urine and serum studies, we might be able to move his treatments back a little bit longer.  I will plan to see him back in 1 month.  [name], MD 6/5/20249:44 AM

## 2022-06-16 NOTE — Patient Instructions (Signed)
[facility]  Discharge Instructions: Thank you for choosing [facility] to provide your oncology and hematology care.   If you have a lab appointment with the Cancer Center, please go directly to the Cancer Center and check in at the registration area.  Wear comfortable clothing and clothing appropriate for easy access to any Portacath or PICC line.   We strive to give you quality time with your provider. You may need to reschedule your appointment if you arrive late (15 or more minutes).  Arriving late affects you and other patients whose appointments are after yours.  Also, if you miss three or more appointments without notifying the office, you may be dismissed from the clinic at the provider's discretion.      For prescription refill requests, have your pharmacy contact our office and allow 72 hours for refills to be completed.    Today you received the following chemotherapy and/or immunotherapy agents:  Faspro and Velcade      To help prevent nausea and vomiting after your treatment, we encourage you to take your nausea medication as directed.  BELOW ARE SYMPTOMS THAT SHOULD BE REPORTED IMMEDIATELY: *FEVER GREATER THAN 100.4 F (38 C) OR HIGHER *CHILLS OR SWEATING *NAUSEA AND VOMITING THAT IS NOT CONTROLLED WITH YOUR NAUSEA MEDICATION *UNUSUAL SHORTNESS OF BREATH *UNUSUAL BRUISING OR BLEEDING *URINARY PROBLEMS (pain or burning when urinating, or frequent urination) *BOWEL PROBLEMS (unusual diarrhea, constipation, pain near the anus) TENDERNESS IN MOUTH AND THROAT WITH OR WITHOUT PRESENCE OF ULCERS (sore throat, sores in mouth, or a toothache) UNUSUAL RASH, SWELLING OR PAIN  UNUSUAL VAGINAL DISCHARGE OR ITCHING   Items with * indicate a potential emergency and should be followed up as soon as possible or go to the Emergency Department if any problems should occur.  Please show the CHEMOTHERAPY ALERT CARD or IMMUNOTHERAPY ALERT CARD  at check-in to the Emergency Department and triage nurse. Should you have questions after your visit or need to cancel or reschedule your appointment, please contact [facility]  [phone] and follow the prompts.  Office hours are 8:00 a.m. to 4:30 p.m. Monday - Friday. Please note that voicemails left after 4:00 p.m. may not be returned until the following business day.  We are closed weekends and major holidays. You have access to a nurse at all times for urgent questions. Please call the main number to the clinic [phone] and follow the prompts.  For any non-urgent questions, you may also contact your provider using MyChart. We now offer e-Visits for anyone 18 and older to request care online for non-urgent symptoms. For details visit [email or web address].   Also download the MyChart app! Go to the app store, search "MyChart", open the app, select [facility], and log in with your MyChart username and password.   

## 2022-06-16 NOTE — Progress Notes (Signed)
OK to treat with a serum creatinine of 1.6 per Dr. Myna Hidalgo. Pharmacy notified.

## 2022-06-17 ENCOUNTER — Encounter: Payer: Self-pay | Admitting: Hematology & Oncology

## 2022-06-17 ENCOUNTER — Other Ambulatory Visit: Payer: Self-pay

## 2022-06-17 LAB — IGG, IGA, IGM
IgA: 121 mg/dL (ref 61–437)
IgG (Immunoglobin G), Serum: 717 mg/dL (ref 603–1613)
IgM (Immunoglobulin M), Srm: 37 mg/dL (ref 15–143)

## 2022-06-17 LAB — KAPPA/LAMBDA LIGHT CHAINS
Kappa free light chain: 26.3 mg/L — ABNORMAL HIGH (ref 3.3–19.4)
Kappa, lambda light chain ratio: 2.23 — ABNORMAL HIGH (ref 0.26–1.65)
Lambda free light chains: 11.8 mg/L (ref 5.7–26.3)

## 2022-06-18 ENCOUNTER — Ambulatory Visit (HOSPITAL_BASED_OUTPATIENT_CLINIC_OR_DEPARTMENT_OTHER)
Admission: RE | Admit: 2022-06-18 | Discharge: 2022-06-18 | Disposition: A | Discharge status: Home / Self Care | Payer: Medicare Other | Source: Ambulatory Visit | Attending: Hematology & Oncology | Admitting: Hematology & Oncology

## 2022-06-18 DIAGNOSIS — D472 Monoclonal gammopathy: Secondary | ICD-10-CM | POA: Insufficient documentation

## 2022-06-18 MED ORDER — GADOBUTROL 1 MMOL/ML IV SOLN
7.5000 mL | Freq: Once | INTRAVENOUS | Status: AC | PRN
  Administered 2022-06-18: 7.5 mL via INTRAVENOUS
  Filled 2022-06-18: qty 7.5

## 2022-06-24 ENCOUNTER — Other Ambulatory Visit: Payer: Self-pay

## 2022-06-25 LAB — PROTEIN ELECTROPHORESIS, SERUM, WITH REFLEX
A/G Ratio: 1.5 (ref 0.7–1.7)
Albumin ELP: 3.8 g/dL (ref 2.9–4.4)
Alpha-1-Globulin: 0.3 g/dL (ref 0.0–0.4)
Alpha-2-Globulin: 0.7 g/dL (ref 0.4–1.0)
Beta Globulin: 0.9 g/dL (ref 0.7–1.3)
Gamma Globulin: 0.6 g/dL (ref 0.4–1.8)
Globulin, Total: 2.5 g/dL (ref 2.2–3.9)
M-Spike, %: 0.2 g/dL — ABNORMAL HIGH
SPEP Interpretation: 0
Total Protein ELP: 6.3 g/dL (ref 6.0–8.5)

## 2022-06-25 LAB — IMMUNOFIXATION REFLEX, SERUM
IgA: 120 mg/dL (ref 61–437)
IgG (Immunoglobin G), Serum: 691 mg/dL (ref 603–1613)
IgM (Immunoglobulin M), Srm: 32 mg/dL (ref 15–143)

## 2022-06-30 ENCOUNTER — Inpatient Hospital Stay: Payer: Medicare Other

## 2022-06-30 VITALS — BP 154/61 | HR 59 | Temp 97.7°F | Resp 20 | Ht 74.0 in | Wt 172.0 lb

## 2022-06-30 DIAGNOSIS — D472 Monoclonal gammopathy: Secondary | ICD-10-CM

## 2022-06-30 DIAGNOSIS — Z5112 Encounter for antineoplastic immunotherapy: Secondary | ICD-10-CM | POA: Diagnosis not present

## 2022-06-30 LAB — CBC WITH DIFFERENTIAL (CANCER CENTER ONLY)
Abs Immature Granulocytes: 0.01 10*3/uL (ref 0.00–0.07)
Basophils Absolute: 0 10*3/uL (ref 0.0–0.1)
Basophils Relative: 1 %
Eosinophils Absolute: 0.1 10*3/uL (ref 0.0–0.5)
Eosinophils Relative: 1 %
HCT: 40.4 % (ref 39.0–52.0)
Hemoglobin: 13 g/dL (ref 13.0–17.0)
Immature Granulocytes: 0 %
Lymphocytes Relative: 24 %
Lymphs Abs: 1.6 10*3/uL (ref 0.7–4.0)
MCH: 30.2 pg (ref 26.0–34.0)
MCHC: 32.2 g/dL (ref 30.0–36.0)
MCV: 94 fL (ref 80.0–100.0)
Monocytes Absolute: 0.5 10*3/uL (ref 0.1–1.0)
Monocytes Relative: 7 %
Neutro Abs: 4.5 10*3/uL (ref 1.7–7.7)
Neutrophils Relative %: 67 %
Platelet Count: 111 10*3/uL — ABNORMAL LOW (ref 150–400)
RBC: 4.3 MIL/uL (ref 4.22–5.81)
RDW: 13.2 % (ref 11.5–15.5)
WBC Count: 6.7 10*3/uL (ref 4.0–10.5)
nRBC: 0 % (ref 0.0–0.2)

## 2022-06-30 MED ORDER — DEXAMETHASONE 4 MG PO TABS: 20.0000 mg | ORAL_TABLET | Freq: Once | ORAL | Status: DC

## 2022-06-30 MED ORDER — ACETAMINOPHEN 325 MG PO TABS: 650.0000 mg | ORAL_TABLET | Freq: Once | ORAL | Status: DC

## 2022-06-30 MED ORDER — DIPHENHYDRAMINE HCL 25 MG PO CAPS: 50.0000 mg | ORAL_CAPSULE | Freq: Once | ORAL | Status: DC

## 2022-06-30 MED ORDER — DARATUMUMAB-HYALURONIDASE-FIHJ 1800-30000 MG-UT/15ML ~~LOC~~ SOLN
1800.0000 mg | Freq: Once | SUBCUTANEOUS | Status: AC
  Administered 2022-06-30: 1800 mg via SUBCUTANEOUS
  Filled 2022-06-30: qty 15

## 2022-06-30 MED ORDER — BORTEZOMIB CHEMO SQ INJECTION 3.5 MG (2.5MG/ML)
1.3000 mg/m2 | Freq: Once | INTRAMUSCULAR | Status: AC
  Administered 2022-06-30: 2.5 mg via SUBCUTANEOUS
  Filled 2022-06-30: qty 1

## 2022-06-30 NOTE — Patient Instructions (Signed)
[facility]  Discharge Instructions: Thank you for choosing [facility] to provide your oncology and hematology care.   If you have a lab appointment with the Cancer Center, please go directly to the Cancer Center and check in at the registration area.  Wear comfortable clothing and clothing appropriate for easy access to any Portacath or PICC line.   We strive to give you quality time with your provider. You may need to reschedule your appointment if you arrive late (15 or more minutes).  Arriving late affects you and other patients whose appointments are after yours.  Also, if you miss three or more appointments without notifying the office, you may be dismissed from the clinic at the provider's discretion.      For prescription refill requests, have your pharmacy contact our office and allow 72 hours for refills to be completed.    Today you received the following chemotherapy and/or immunotherapy agents velcade faspro     To help prevent nausea and vomiting after your treatment, we encourage you to take your nausea medication as directed.  BELOW ARE SYMPTOMS THAT SHOULD BE REPORTED IMMEDIATELY: *FEVER GREATER THAN 100.4 F (38 C) OR HIGHER *CHILLS OR SWEATING *NAUSEA AND VOMITING THAT IS NOT CONTROLLED WITH YOUR NAUSEA MEDICATION *UNUSUAL SHORTNESS OF BREATH *UNUSUAL BRUISING OR BLEEDING *URINARY PROBLEMS (pain or burning when urinating, or frequent urination) *BOWEL PROBLEMS (unusual diarrhea, constipation, pain near the anus) TENDERNESS IN MOUTH AND THROAT WITH OR WITHOUT PRESENCE OF ULCERS (sore throat, sores in mouth, or a toothache) UNUSUAL RASH, SWELLING OR PAIN  UNUSUAL VAGINAL DISCHARGE OR ITCHING   Items with * indicate a potential emergency and should be followed up as soon as possible or go to the Emergency Department if any problems should occur.  Please show the CHEMOTHERAPY ALERT CARD or IMMUNOTHERAPY ALERT CARD at  check-in to the Emergency Department and triage nurse. Should you have questions after your visit or need to cancel or reschedule your appointment, please contact [facility]  [phone] and follow the prompts.  Office hours are 8:00 a.m. to 4:30 p.m. Monday - Friday. Please note that voicemails left after 4:00 p.m. may not be returned until the following business day.  We are closed weekends and major holidays. You have access to a nurse at all times for urgent questions. Please call the main number to the clinic [phone] and follow the prompts.  For any non-urgent questions, you may also contact your provider using MyChart. We now offer e-Visits for anyone 21 and older to request care online for non-urgent symptoms. For details visit [email or web address].   Also download the MyChart app! Go to the app store, search "MyChart", open the app, select , and log in with your MyChart username and password.

## 2022-06-30 NOTE — Progress Notes (Signed)
Okay to use CMET from 6/5 per Dr. Myna Hidalgo. No CMET to be drawn today.

## 2022-07-02 LAB — UPEP/UIFE/LIGHT CHAINS/TP, 24-HR UR
% BETA, Urine: 0 %
ALPHA 1 URINE: 0 %
Albumin, U: 100 %
Alpha 2, Urine: 0 %
Free Kappa Lt Chains,Ur: 20.32 mg/L (ref 1.17–86.46)
Free Kappa/Lambda Ratio: 9.15 (ref 1.83–14.26)
Free Lambda Lt Chains,Ur: 2.22 mg/L (ref 0.27–15.21)
GAMMA GLOBULIN URINE: 0 %
Total Protein, Urine-Ur/day: 133 mg/24 hr (ref 30–150)
Total Protein, Urine: 5.1 mg/dL
Total Volume: 2600

## 2022-07-05 ENCOUNTER — Telehealth: Payer: Self-pay | Admitting: *Deleted

## 2022-07-05 NOTE — Telephone Encounter (Signed)
-----   Message from [name], MD sent at 07/04/2022  8:29 PM EDT ----- Call - the urine protein is still coming down!!  James Marks

## 2022-07-09 ENCOUNTER — Other Ambulatory Visit: Payer: Self-pay | Admitting: Hematology & Oncology

## 2022-07-09 NOTE — Progress Notes (Signed)
Careplan updated per Dr. Gustavo Lah instructions to Faspro q28d with Velcade q14d.

## 2022-07-14 ENCOUNTER — Other Ambulatory Visit: Payer: Self-pay

## 2022-07-14 ENCOUNTER — Encounter: Payer: Self-pay | Admitting: Hematology & Oncology

## 2022-07-14 ENCOUNTER — Inpatient Hospital Stay: Payer: Medicare Other | Attending: Hematology & Oncology

## 2022-07-14 ENCOUNTER — Inpatient Hospital Stay: Payer: Medicare Other

## 2022-07-14 ENCOUNTER — Inpatient Hospital Stay (HOSPITAL_BASED_OUTPATIENT_CLINIC_OR_DEPARTMENT_OTHER): Payer: Medicare Other | Admitting: Hematology & Oncology

## 2022-07-14 VITALS — BP 145/58 | HR 61 | Temp 97.8°F | Resp 18 | Ht 74.0 in | Wt 170.0 lb

## 2022-07-14 DIAGNOSIS — Z5112 Encounter for antineoplastic immunotherapy: Secondary | ICD-10-CM | POA: Diagnosis not present

## 2022-07-14 DIAGNOSIS — D472 Monoclonal gammopathy: Secondary | ICD-10-CM

## 2022-07-14 DIAGNOSIS — F17210 Nicotine dependence, cigarettes, uncomplicated: Secondary | ICD-10-CM | POA: Insufficient documentation

## 2022-07-14 DIAGNOSIS — R5383 Other fatigue: Secondary | ICD-10-CM | POA: Diagnosis not present

## 2022-07-14 DIAGNOSIS — C9000 Multiple myeloma not having achieved remission: Secondary | ICD-10-CM | POA: Insufficient documentation

## 2022-07-14 DIAGNOSIS — Z7982 Long term (current) use of aspirin: Secondary | ICD-10-CM | POA: Diagnosis not present

## 2022-07-14 DIAGNOSIS — R0602 Shortness of breath: Secondary | ICD-10-CM | POA: Insufficient documentation

## 2022-07-14 DIAGNOSIS — Z79899 Other long term (current) drug therapy: Secondary | ICD-10-CM | POA: Insufficient documentation

## 2022-07-14 LAB — CMP (CANCER CENTER ONLY)
ALT: 7 U/L (ref 0–44)
AST: 13 U/L — ABNORMAL LOW (ref 15–41)
Albumin: 4.3 g/dL (ref 3.5–5.0)
Alkaline Phosphatase: 102 U/L (ref 38–126)
Anion gap: 7 (ref 5–15)
BUN: 28 mg/dL — ABNORMAL HIGH (ref 8–23)
CO2: 29 mmol/L (ref 22–32)
Calcium: 9.6 mg/dL (ref 8.9–10.3)
Chloride: 105 mmol/L (ref 98–111)
Creatinine: 1.68 mg/dL — ABNORMAL HIGH (ref 0.61–1.24)
GFR, Estimated: 40 mL/min — ABNORMAL LOW (ref >60)
Glucose, Bld: 151 mg/dL — ABNORMAL HIGH (ref 70–99)
Potassium: 4.6 mmol/L (ref 3.5–5.1)
Sodium: 141 mmol/L (ref 135–145)
Total Bilirubin: 0.5 mg/dL (ref 0.3–1.2)
Total Protein: 6.8 g/dL (ref 6.5–8.1)

## 2022-07-14 LAB — CBC WITH DIFFERENTIAL (CANCER CENTER ONLY)
Abs Immature Granulocytes: 0.02 10*3/uL (ref 0.00–0.07)
Basophils Absolute: 0 10*3/uL (ref 0.0–0.1)
Basophils Relative: 0 %
Eosinophils Absolute: 0.1 10*3/uL (ref 0.0–0.5)
Eosinophils Relative: 1 %
HCT: 40.5 % (ref 39.0–52.0)
Hemoglobin: 12.8 g/dL — ABNORMAL LOW (ref 13.0–17.0)
Immature Granulocytes: 0 %
Lymphocytes Relative: 17 %
Lymphs Abs: 1.3 10*3/uL (ref 0.7–4.0)
MCH: 29.7 pg (ref 26.0–34.0)
MCHC: 31.6 g/dL (ref 30.0–36.0)
MCV: 94 fL (ref 80.0–100.0)
Monocytes Absolute: 0.4 10*3/uL (ref 0.1–1.0)
Monocytes Relative: 5 %
Neutro Abs: 5.7 10*3/uL (ref 1.7–7.7)
Neutrophils Relative %: 77 %
Platelet Count: 179 10*3/uL (ref 150–400)
RBC: 4.31 MIL/uL (ref 4.22–5.81)
RDW: 13.2 % (ref 11.5–15.5)
WBC Count: 7.5 10*3/uL (ref 4.0–10.5)
nRBC: 0 % (ref 0.0–0.2)

## 2022-07-14 LAB — LACTATE DEHYDROGENASE: LDH: 130 U/L (ref 98–192)

## 2022-07-14 MED ORDER — DIPHENHYDRAMINE HCL 25 MG PO CAPS: 50.0000 mg | ORAL_CAPSULE | Freq: Once | ORAL | Status: DC

## 2022-07-14 MED ORDER — DEXAMETHASONE 4 MG PO TABS: 20.0000 mg | ORAL_TABLET | Freq: Once | ORAL | Status: DC

## 2022-07-14 MED ORDER — ACETAMINOPHEN 325 MG PO TABS: 650.0000 mg | ORAL_TABLET | Freq: Once | ORAL | Status: DC

## 2022-07-14 MED ORDER — DARATUMUMAB-HYALURONIDASE-FIHJ 1800-30000 MG-UT/15ML ~~LOC~~ SOLN
1800.0000 mg | Freq: Once | SUBCUTANEOUS | Status: AC
  Administered 2022-07-14: 1800 mg via SUBCUTANEOUS
  Filled 2022-07-14: qty 15

## 2022-07-14 MED ORDER — BORTEZOMIB CHEMO SQ INJECTION 3.5 MG (2.5MG/ML)
1.3000 mg/m2 | Freq: Once | INTRAMUSCULAR | Status: AC
  Administered 2022-07-14: 2.5 mg via SUBCUTANEOUS
  Filled 2022-07-14: qty 1

## 2022-07-14 NOTE — Patient Instructions (Signed)
[facility]  Discharge Instructions: Thank you for choosing [facility] to provide your oncology and hematology care.   If you have a lab appointment with the Cancer Center, please go directly to the Cancer Center and check in at the registration area.  Wear comfortable clothing and clothing appropriate for easy access to any Portacath or PICC line.   We strive to give you quality time with your provider. You may need to reschedule your appointment if you arrive late (15 or more minutes).  Arriving late affects you and other patients whose appointments are after yours.  Also, if you miss three or more appointments without notifying the office, you may be dismissed from the clinic at the provider's discretion.      For prescription refill requests, have your pharmacy contact our office and allow 72 hours for refills to be completed.    Today you received the following chemotherapy and/or immunotherapy agents:  Faspro and Velcade      To help prevent nausea and vomiting after your treatment, we encourage you to take your nausea medication as directed.  BELOW ARE SYMPTOMS THAT SHOULD BE REPORTED IMMEDIATELY: *FEVER GREATER THAN 100.4 F (38 C) OR HIGHER *CHILLS OR SWEATING *NAUSEA AND VOMITING THAT IS NOT CONTROLLED WITH YOUR NAUSEA MEDICATION *UNUSUAL SHORTNESS OF BREATH *UNUSUAL BRUISING OR BLEEDING *URINARY PROBLEMS (pain or burning when urinating, or frequent urination) *BOWEL PROBLEMS (unusual diarrhea, constipation, pain near the anus) TENDERNESS IN MOUTH AND THROAT WITH OR WITHOUT PRESENCE OF ULCERS (sore throat, sores in mouth, or a toothache) UNUSUAL RASH, SWELLING OR PAIN  UNUSUAL VAGINAL DISCHARGE OR ITCHING   Items with * indicate a potential emergency and should be followed up as soon as possible or go to the Emergency Department if any problems should occur.  Please show the CHEMOTHERAPY ALERT CARD or IMMUNOTHERAPY ALERT CARD  at check-in to the Emergency Department and triage nurse. Should you have questions after your visit or need to cancel or reschedule your appointment, please contact [facility]  [phone] and follow the prompts.  Office hours are 8:00 a.m. to 4:30 p.m. Monday - Friday. Please note that voicemails left after 4:00 p.m. may not be returned until the following business day.  We are closed weekends and major holidays. You have access to a nurse at all times for urgent questions. Please call the main number to the clinic [phone] and follow the prompts.  For any non-urgent questions, you may also contact your provider using MyChart. We now offer e-Visits for anyone 18 and older to request care online for non-urgent symptoms. For details visit [email or web address].   Also download the MyChart app! Go to the app store, search "MyChart", open the app, select [facility], and log in with your MyChart username and password.   

## 2022-07-14 NOTE — Progress Notes (Signed)
Per Dr. Myna Hidalgo ok to treat with creatinine 1.68

## 2022-07-14 NOTE — Progress Notes (Signed)
. Hematology and Oncology Follow Up Visit  James Marks [id number] [birth year] 82 y.o. 07/14/2022   Principle Diagnosis:  IgA kappa  myeloma  Current Therapy:   Velcade/Revlimid/Decadron -- s/p cycle #6 -- start on 07/27/2021 Faspro/Velcade/Revlimid/Decadron -- s/p cycle #6 - start  on 01/28/2022 --Revlimid  DC'd on 02/24/2022     Interim History:  James Marks is back for follow-up.  He seems to be doing quite well right now.  He really has had no specific complaints.  He has had no problems with pain.  Before we last saw him, his pain on the right side.  We cannot find an etiology for this.  We did do an MRI.  This was done on 06/18/2022.  The MRI was done of the lumbar spine.  This really did not show anything that was apparent.  He had no myelomatous lytic lesions.  He had a little bit of degeneration but much less than what is typical for his age.  He has had no fever.  There is been no cough.  He has had no nausea or vomiting.  Is been no bleeding.  He has responded very nicely to treatment.  His last monoclonal spike was 0.2 g/dL.  The Kappa light chain was 2.6 mg/dL.  The IgA level was 120 mg/dL.  We did do a 24-hour urine on him.  This showed a kappa light chain of 20.3 mg/L.  Overall, I would say that his performance status is probably ECOG 1. .       Medications:  Current Outpatient Medications:    acetaminophen (TYLENOL) 325 MG tablet, Take 325 mg by mouth. 02/10/2022 Takes 325 mg one hour prior to antibody treatment., Disp: , Rfl:    acyclovir (ZOVIRAX) 400 MG tablet, TAKE 1 TABLET(400 MG) BY MOUTH TWICE DAILY, Disp: 180 tablet, Rfl: 4   amLODipine (NORVASC) 2.5 MG tablet, Take 1 tablet (2.5 mg total) by mouth daily., Disp: 90 tablet, Rfl: 3   amoxicillin (AMOXIL) 500 MG capsule, Take four tablets (2000 mg) total one hour prior to dental procedure. (Patient not taking: Reported on 03/25/2022), Disp: 4 capsule, Rfl: 0   BABY ASPIRIN PO, Take 81 mg by mouth daily., Disp: , Rfl:     dexamethasone (DECADRON) 4 MG tablet, TAKE 5 TABLETS BY MOUTH 1 HOUR PRIOR TO CHEMO TREATMENT, Disp: 450 tablet, Rfl: 0   diphenhydrAMINE (BENADRYL) 25 mg capsule, Take 50 mg by mouth. 02/10/2022 Takes one hour prior to antibody treatment., Disp: , Rfl:    gabapentin (NEURONTIN) 300 MG capsule, TAKE 1 CAPSULE BY MOUTH EVERY DAY WITH BREAKFAST, 1 CAPSULE WITH DINNER AND 2 CAPSULES AT BEDTIME, Disp: 360 capsule, Rfl: 4   losartan (COZAAR) 50 MG tablet, TAKE 1 TABLET BY MOUTH DAILY, Disp: 90 tablet, Rfl: 3   montelukast (SINGULAIR) 10 MG tablet, Take 1 tablet (10 mg total) by mouth at bedtime., Disp: 30 tablet, Rfl: 2   Multiple Vitamins-Minerals (PRESERVISION AREDS 2 PO), Take by mouth daily at 6 (six) AM., Disp: , Rfl:    ondansetron (ZOFRAN) 8 MG tablet, Take 1 tablet (8 mg total) by mouth every 8 (eight) hours as needed for nausea or vomiting. (Patient not taking: Reported on 04/21/2022), Disp: 30 tablet, Rfl: 1   prochlorperazine (COMPAZINE) 10 MG tablet, Take 1 tablet (10 mg total) by mouth every 6 (six) hours as needed for nausea or vomiting. (Patient not taking: Reported on 04/21/2022), Disp: 30 tablet, Rfl: 1   traMADol (ULTRAM) 50 MG tablet, Take  1 tablet (50 mg total) by mouth every 6 (six) hours as needed. You can take 1-2, if needed, every 6 hours for pain, Disp: 60 tablet, Rfl: 0   traZODone (DESYREL) 50 MG tablet, Take 1 tablet (50 mg total) by mouth at bedtime. (Patient not taking: Reported on 05/19/2022), Disp: 30 tablet, Rfl: 3 No current facility-administered medications for this visit.  Facility-Administered Medications Ordered in Other Visits:    0.9 %  sodium chloride infusion, , Intravenous, Continuous, Dwight Adamczak, Rose Phi, MD, Stopped at 08/05/14 1710  Allergies: No Known Allergies  Past Medical History, Surgical history, Social history, and Family History were reviewed and updated.  Review of Systems: Review of Systems  Constitutional:  Positive for malaise/fatigue.  HENT:   Positive for congestion.   Eyes: Negative.   Respiratory:  Positive for shortness of breath.   Cardiovascular:  Positive for palpitations.  Gastrointestinal: Negative.   Genitourinary: Negative.   Musculoskeletal:  Positive for back pain and myalgias.  Skin: Negative.   Neurological:  Positive for tingling.  Endo/Heme/Allergies: Negative.   Psychiatric/Behavioral: Negative.      Physical Exam:  height is 6\' 2"  (1.88 m) and weight is 170 lb (77.1 kg). His oral temperature is 97.8 F (36.6 C). His blood pressure is 145/58 (abnormal) and his pulse is 61. His respiration is 18 and oxygen saturation is 100%.   Wt Readings from Last 3 Encounters:  07/14/22 170 lb (77.1 kg)  06/30/22 172 lb (78 kg)  06/16/22 173 lb (78.5 kg)     Physical Exam Vitals reviewed.  HENT:     Head: Normocephalic and atraumatic.  Eyes:     Pupils: Pupils are equal, round, and reactive to light.  Cardiovascular:     Rate and Rhythm: Normal rate and regular rhythm.     Heart sounds: Normal heart sounds.  Pulmonary:     Effort: Pulmonary effort is normal.     Breath sounds: Normal breath sounds.  Abdominal:     General: Bowel sounds are normal.     Palpations: Abdomen is soft.  Musculoskeletal:        General: No tenderness or deformity. Normal range of motion.     Cervical back: Normal range of motion.  Lymphadenopathy:     Cervical: No cervical adenopathy.  Skin:    General: Skin is warm and dry.     Findings: No erythema or rash.  Neurological:     Mental Status: He is alert and oriented to person, place, and time.  Psychiatric:        Behavior: Behavior normal.        Thought Content: Thought content normal.        Judgment: Judgment normal.      Lab Results  Component Value Date   WBC 7.5 07/14/2022   HGB 12.8 (L) 07/14/2022   HCT 40.5 07/14/2022   MCV 94.0 07/14/2022   PLT 179 07/14/2022     Chemistry      Component Value Date/Time   NA 141 07/14/2022 0954   NA 143 12/06/2016  1156   NA 139 08/02/2016 1146   K 4.6 07/14/2022 0954   K 4.2 12/06/2016 1156   K 4.7 08/02/2016 1146   CL 105 07/14/2022 0954   CL 107 12/06/2016 1156   CO2 29 07/14/2022 0954   CO2 28 12/06/2016 1156   CO2 24 08/02/2016 1146   BUN 28 (H) 07/14/2022 0954   BUN 29 (H) 12/06/2016 1156   BUN 34.3 (  H) 08/02/2016 1146   CREATININE 1.68 (H) 07/14/2022 0954   CREATININE 2.1 (H) 12/06/2016 1156   CREATININE 1.7 (H) 08/02/2016 1146      Component Value Date/Time   CALCIUM 9.6 07/14/2022 0954   CALCIUM 9.1 12/06/2016 1156   CALCIUM 9.5 08/02/2016 1146   ALKPHOS 102 07/14/2022 0954   ALKPHOS 108 (H) 12/06/2016 1156   ALKPHOS 115 08/02/2016 1146   AST 13 (L) 07/14/2022 0954   AST 14 08/02/2016 1146   ALT 7 07/14/2022 0954   ALT 18 12/06/2016 1156   ALT 9 08/02/2016 1146   BILITOT 0.5 07/14/2022 0954   BILITOT 0.41 08/02/2016 1146      Impression and Plan:  James Marks is 82 year old man with IgA kappa myeloma.  He is on fairly aggressive therapy right now.  We had added the Faspro.  I think adding the Faspro clearly helped him out.  His Kappa light chain has responded nicely.  We will see what today's level is.  I will keep him on the same schedule as he is he has been on.  I will have Velcade every 2 weeks.  About the Faspro once a month.  I will plan to see him back myself in 1 month.  He will have the Velcade in 2 weeks.   [name], MD 7/3/202410:50 AM

## 2022-07-16 LAB — KAPPA/LAMBDA LIGHT CHAINS
Kappa free light chain: 31.7 mg/L — ABNORMAL HIGH (ref 3.3–19.4)
Kappa, lambda light chain ratio: 2.31 — ABNORMAL HIGH (ref 0.26–1.65)
Lambda free light chains: 13.7 mg/L (ref 5.7–26.3)

## 2022-07-16 LAB — IGG, IGA, IGM
IgA: 122 mg/dL (ref 61–437)
IgG (Immunoglobin G), Serum: 665 mg/dL (ref 603–1613)
IgM (Immunoglobulin M), Srm: 32 mg/dL (ref 15–143)

## 2022-07-21 LAB — PROTEIN ELECTROPHORESIS, SERUM, WITH REFLEX
A/G Ratio: 1.6 (ref 0.7–1.7)
Albumin ELP: 3.8 g/dL (ref 2.9–4.4)
Alpha-1-Globulin: 0.2 g/dL (ref 0.0–0.4)
Alpha-2-Globulin: 0.7 g/dL (ref 0.4–1.0)
Beta Globulin: 0.9 g/dL (ref 0.7–1.3)
Gamma Globulin: 0.6 g/dL (ref 0.4–1.8)
Globulin, Total: 2.4 g/dL (ref 2.2–3.9)
M-Spike, %: 0.2 g/dL — ABNORMAL HIGH
SPEP Interpretation: 0
Total Protein ELP: 6.2 g/dL (ref 6.0–8.5)

## 2022-07-21 LAB — IMMUNOFIXATION REFLEX, SERUM
IgA: 116 mg/dL (ref 61–437)
IgG (Immunoglobin G), Serum: 687 mg/dL (ref 603–1613)
IgM (Immunoglobulin M), Srm: 32 mg/dL (ref 15–143)

## 2022-07-28 ENCOUNTER — Inpatient Hospital Stay: Payer: Medicare Other

## 2022-07-28 VITALS — BP 134/62 | HR 79 | Temp 98.2°F | Resp 20

## 2022-07-28 DIAGNOSIS — Z5112 Encounter for antineoplastic immunotherapy: Secondary | ICD-10-CM | POA: Diagnosis not present

## 2022-07-28 DIAGNOSIS — D472 Monoclonal gammopathy: Secondary | ICD-10-CM

## 2022-07-28 LAB — CBC WITH DIFFERENTIAL (CANCER CENTER ONLY)
Abs Immature Granulocytes: 0.03 10*3/uL (ref 0.00–0.07)
Basophils Absolute: 0 10*3/uL (ref 0.0–0.1)
Basophils Relative: 1 %
Eosinophils Absolute: 0.1 10*3/uL (ref 0.0–0.5)
Eosinophils Relative: 1 %
HCT: 40.4 % (ref 39.0–52.0)
Hemoglobin: 13.1 g/dL (ref 13.0–17.0)
Immature Granulocytes: 0 %
Lymphocytes Relative: 20 %
Lymphs Abs: 1.6 10*3/uL (ref 0.7–4.0)
MCH: 29.8 pg (ref 26.0–34.0)
MCHC: 32.4 g/dL (ref 30.0–36.0)
MCV: 92 fL (ref 80.0–100.0)
Monocytes Absolute: 0.5 10*3/uL (ref 0.1–1.0)
Monocytes Relative: 7 %
Neutro Abs: 5.8 10*3/uL (ref 1.7–7.7)
Neutrophils Relative %: 71 %
Platelet Count: 178 10*3/uL (ref 150–400)
RBC: 4.39 MIL/uL (ref 4.22–5.81)
RDW: 13.6 % (ref 11.5–15.5)
WBC Count: 8 10*3/uL (ref 4.0–10.5)
nRBC: 0 % (ref 0.0–0.2)

## 2022-07-28 LAB — CMP (CANCER CENTER ONLY)
ALT: 7 U/L (ref 0–44)
AST: 11 U/L — ABNORMAL LOW (ref 15–41)
Albumin: 4.3 g/dL (ref 3.5–5.0)
Alkaline Phosphatase: 108 U/L (ref 38–126)
Anion gap: 7 (ref 5–15)
BUN: 28 mg/dL — ABNORMAL HIGH (ref 8–23)
CO2: 28 mmol/L (ref 22–32)
Calcium: 9.4 mg/dL (ref 8.9–10.3)
Chloride: 107 mmol/L (ref 98–111)
Creatinine: 1.63 mg/dL — ABNORMAL HIGH (ref 0.61–1.24)
GFR, Estimated: 42 mL/min — ABNORMAL LOW (ref >60)
Glucose, Bld: 133 mg/dL — ABNORMAL HIGH (ref 70–99)
Potassium: 4.6 mmol/L (ref 3.5–5.1)
Sodium: 142 mmol/L (ref 135–145)
Total Bilirubin: 0.4 mg/dL (ref 0.3–1.2)
Total Protein: 6.7 g/dL (ref 6.5–8.1)

## 2022-07-28 MED ORDER — BORTEZOMIB CHEMO SQ INJECTION 3.5 MG (2.5MG/ML)
1.3000 mg/m2 | Freq: Once | INTRAMUSCULAR | Status: AC
  Administered 2022-07-28: 2.5 mg via SUBCUTANEOUS
  Filled 2022-07-28: qty 1

## 2022-07-28 MED ORDER — DEXAMETHASONE 4 MG PO TABS
20.0000 mg | ORAL_TABLET | Freq: Once | ORAL | Status: AC
  Administered 2022-07-28: 20 mg via ORAL
  Filled 2022-07-28: qty 5

## 2022-07-28 NOTE — Patient Instructions (Signed)
[facility]  Discharge Instructions: Thank you for choosing [facility] to provide your oncology and hematology care.   If you have a lab appointment with the Cancer Center, please go directly to the Cancer Center and check in at the registration area.  Wear comfortable clothing and clothing appropriate for easy access to any Portacath or PICC line.   We strive to give you quality time with your provider. You may need to reschedule your appointment if you arrive late (15 or more minutes).  Arriving late affects you and other patients whose appointments are after yours.  Also, if you miss three or more appointments without notifying the office, you may be dismissed from the clinic at the provider's discretion.      For prescription refill requests, have your pharmacy contact our office and allow 72 hours for refills to be completed.    Today you received the following chemotherapy and/or immunotherapy agents Velcade.      To help prevent nausea and vomiting after your treatment, we encourage you to take your nausea medication as directed.  BELOW ARE SYMPTOMS THAT SHOULD BE REPORTED IMMEDIATELY: *FEVER GREATER THAN 100.4 F (38 C) OR HIGHER *CHILLS OR SWEATING *NAUSEA AND VOMITING THAT IS NOT CONTROLLED WITH YOUR NAUSEA MEDICATION *UNUSUAL SHORTNESS OF BREATH *UNUSUAL BRUISING OR BLEEDING *URINARY PROBLEMS (pain or burning when urinating, or frequent urination) *BOWEL PROBLEMS (unusual diarrhea, constipation, pain near the anus) TENDERNESS IN MOUTH AND THROAT WITH OR WITHOUT PRESENCE OF ULCERS (sore throat, sores in mouth, or a toothache) UNUSUAL RASH, SWELLING OR PAIN  UNUSUAL VAGINAL DISCHARGE OR ITCHING   Items with * indicate a potential emergency and should be followed up as soon as possible or go to the Emergency Department if any problems should occur.  Please show the CHEMOTHERAPY ALERT CARD or IMMUNOTHERAPY ALERT CARD at check-in  to the Emergency Department and triage nurse. Should you have questions after your visit or need to cancel or reschedule your appointment, please contact [facility]  [phone] and follow the prompts.  Office hours are 8:00 a.m. to 4:30 p.m. Monday - Friday. Please note that voicemails left after 4:00 p.m. may not be returned until the following business day.  We are closed weekends and major holidays. You have access to a nurse at all times for urgent questions. Please call the main number to the clinic [phone] and follow the prompts.  For any non-urgent questions, you may also contact your provider using MyChart. We now offer e-Visits for anyone 18 and older to request care online for non-urgent symptoms. For details visit [email or web address].   Also download the MyChart app! Go to the app store, search "MyChart", open the app, select [facility], and log in with your MyChart username and password.   

## 2022-08-11 ENCOUNTER — Inpatient Hospital Stay (HOSPITAL_BASED_OUTPATIENT_CLINIC_OR_DEPARTMENT_OTHER): Payer: Medicare Other | Admitting: Hematology & Oncology

## 2022-08-11 ENCOUNTER — Encounter: Payer: Self-pay | Admitting: Hematology & Oncology

## 2022-08-11 ENCOUNTER — Inpatient Hospital Stay: Payer: Medicare Other

## 2022-08-11 VITALS — BP 129/57 | HR 61 | Temp 97.9°F | Resp 18 | Wt 170.5 lb

## 2022-08-11 DIAGNOSIS — D472 Monoclonal gammopathy: Secondary | ICD-10-CM | POA: Diagnosis not present

## 2022-08-11 DIAGNOSIS — Z5112 Encounter for antineoplastic immunotherapy: Secondary | ICD-10-CM | POA: Diagnosis not present

## 2022-08-11 LAB — CBC WITH DIFFERENTIAL (CANCER CENTER ONLY)
Abs Immature Granulocytes: 0.03 10*3/uL (ref 0.00–0.07)
Basophils Absolute: 0 10*3/uL (ref 0.0–0.1)
Basophils Relative: 1 %
Eosinophils Absolute: 0.1 10*3/uL (ref 0.0–0.5)
Eosinophils Relative: 1 %
HCT: 39.8 % (ref 39.0–52.0)
Hemoglobin: 12.7 g/dL — ABNORMAL LOW (ref 13.0–17.0)
Immature Granulocytes: 0 %
Lymphocytes Relative: 17 %
Lymphs Abs: 1.4 10*3/uL (ref 0.7–4.0)
MCH: 29.9 pg (ref 26.0–34.0)
MCHC: 31.9 g/dL (ref 30.0–36.0)
MCV: 93.6 fL (ref 80.0–100.0)
Monocytes Absolute: 0.4 10*3/uL (ref 0.1–1.0)
Monocytes Relative: 5 %
Neutro Abs: 6.1 10*3/uL (ref 1.7–7.7)
Neutrophils Relative %: 76 %
Platelet Count: 165 10*3/uL (ref 150–400)
RBC: 4.25 MIL/uL (ref 4.22–5.81)
RDW: 13.8 % (ref 11.5–15.5)
WBC Count: 8 10*3/uL (ref 4.0–10.5)
nRBC: 0 % (ref 0.0–0.2)

## 2022-08-11 LAB — CMP (CANCER CENTER ONLY)
ALT: 7 U/L (ref 0–44)
AST: 11 U/L — ABNORMAL LOW (ref 15–41)
Albumin: 4 g/dL (ref 3.5–5.0)
Alkaline Phosphatase: 105 U/L (ref 38–126)
Anion gap: 7 (ref 5–15)
BUN: 23 mg/dL (ref 8–23)
CO2: 29 mmol/L (ref 22–32)
Calcium: 9.2 mg/dL (ref 8.9–10.3)
Chloride: 107 mmol/L (ref 98–111)
Creatinine: 1.58 mg/dL — ABNORMAL HIGH (ref 0.61–1.24)
GFR, Estimated: 43 mL/min — ABNORMAL LOW (ref >60)
Glucose, Bld: 133 mg/dL — ABNORMAL HIGH (ref 70–99)
Potassium: 5.2 mmol/L — ABNORMAL HIGH (ref 3.5–5.1)
Sodium: 143 mmol/L (ref 135–145)
Total Bilirubin: 0.4 mg/dL (ref 0.3–1.2)
Total Protein: 6.6 g/dL (ref 6.5–8.1)

## 2022-08-11 LAB — LACTATE DEHYDROGENASE: LDH: 119 U/L (ref 98–192)

## 2022-08-11 MED ORDER — ACETAMINOPHEN 325 MG PO TABS: 650.0000 mg | ORAL_TABLET | Freq: Once | ORAL | Status: DC

## 2022-08-11 MED ORDER — BORTEZOMIB CHEMO SQ INJECTION 3.5 MG (2.5MG/ML)
1.3000 mg/m2 | Freq: Once | INTRAMUSCULAR | Status: AC
  Administered 2022-08-11: 2.5 mg via SUBCUTANEOUS
  Filled 2022-08-11: qty 1

## 2022-08-11 MED ORDER — DEXAMETHASONE 4 MG PO TABS: 20.0000 mg | ORAL_TABLET | Freq: Once | ORAL | Status: DC

## 2022-08-11 MED ORDER — DARATUMUMAB-HYALURONIDASE-FIHJ 1800-30000 MG-UT/15ML ~~LOC~~ SOLN
1800.0000 mg | Freq: Once | SUBCUTANEOUS | Status: AC
  Administered 2022-08-11: 1800 mg via SUBCUTANEOUS
  Filled 2022-08-11: qty 15

## 2022-08-11 MED ORDER — DIPHENHYDRAMINE HCL 25 MG PO CAPS: 50.0000 mg | ORAL_CAPSULE | Freq: Once | ORAL | Status: DC

## 2022-08-11 NOTE — Patient Instructions (Signed)
Daratumumab Injection What is this medication? DARATUMUMAB (dar a toom ue mab) treats multiple myeloma, a type of bone marrow cancer. It works by helping your immune system slow or stop the spread of cancer cells. It is a monoclonal antibody. This medicine may be used for other purposes; ask your health care provider or pharmacist if you have questions. COMMON BRAND NAME(S): DARZALEX What should I tell my care team before I take this medication? They need to know if you have any of these conditions: Hereditary fructose intolerance Infection, such as chickenpox, herpes, hepatitis B Lung or breathing disease, such as asthma, COPD An unusual or allergic reaction to daratumumab, sorbitol, other medications, foods, dyes, or preservatives Pregnant or trying to get pregnant Breastfeeding How should I use this medication? This medication is injected into a vein. It is given by your care team in a hospital or clinic setting. Talk to your care team about the use of this medication in children. Special care may be needed. Overdosage: If you think you have taken too much of this medicine contact a poison control center or emergency room at once. NOTE: This medicine is only for you. Do not share this medicine with others. What if I miss a dose? Keep appointments for follow-up doses. It is important not to miss your dose. Call your care team if you are unable to keep an appointment. What may interact with this medication? Interactions have not been studied. This list may not describe all possible interactions. Give your health care provider a list of all the medicines, herbs, non-prescription drugs, or dietary supplements you use. Also tell them if you smoke, drink alcohol, or use illegal drugs. Some items may interact with your medicine. What should I watch for while using this medication? Your condition will be monitored carefully while you are receiving this medication. This medication can cause  serious allergic reactions. To reduce your risk, your care team may give you other medication to take before receiving this one. Be sure to follow the directions from your care team. This medication can affect the results of blood tests to match your blood type. These changes can last for up to 6 months after the final dose. Your care team will do blood tests to match your blood type before you start treatment. Tell all of your care team that you are being treated with this medication before receiving a blood transfusion. This medication can affect the results of some tests used to determine treatment response; extra tests may be needed to evaluate response. Talk to your care team if you wish to become pregnant or think you are pregnant. This medication can cause serious birth defects if taken during pregnancy and for 3 months after the last dose. A reliable form of contraception is recommended while taking this medication and for 3 months after the last dose. Talk to your care team about effective forms of contraception. Do not breast-feed while taking this medication. What side effects may I notice from receiving this medication? Side effects that you should report to your care team as soon as possible: Allergic reactions--skin rash, itching, hives, swelling of the face, lips, tongue, or throat Infection--fever, chills, cough, sore throat, wounds that don't heal, pain or trouble when passing urine, general feeling of discomfort or being unwell Infusion reactions--chest pain, shortness of breath or trouble breathing, feeling faint or lightheaded Unusual bruising or bleeding Side effects that usually do not require medical attention (report to your care team if they continue or  are bothersome): Constipation Diarrhea Fatigue Nausea Pain, tingling, or numbness in the hands or feet Swelling of the ankles, hands, or feet This list may not describe all possible side effects. Call your doctor for medical  advice about side effects. You may report side effects to FDA at 1-800-FDA-1088. Where should I keep my medication? This medication is given in a hospital or clinic. It will not be stored at home. NOTE: This sheet is a summary. It may not cover all possible information. If you have questions about this medicine, talk to your doctor, pharmacist, or health care provider.  2024 Elsevier/Gold Standard (2021-11-05 00:00:00) Bortezomib Injection What is this medication? BORTEZOMIB (bor TEZ oh mib) treats lymphoma. It may also be used to treat multiple myeloma, a type of bone marrow cancer. It works by blocking a protein that causes cancer cells to grow and multiply. This helps to slow or stop the spread of cancer cells. This medicine may be used for other purposes; ask your health care provider or pharmacist if you have questions. COMMON BRAND NAME(S): Velcade What should I tell my care team before I take this medication? They need to know if you have any of these conditions: Dehydration Diabetes Heart disease Liver disease Tingling of the fingers or toes or other nerve disorder An unusual or allergic reaction to bortezomib, other medications, foods, dyes, or preservatives If you or your partner are pregnant or trying to get pregnant Breastfeeding How should I use this medication? This medication is injected into a vein or under the skin. It is given by your care team in a hospital or clinic setting. Talk to your care team about the use of this medication in children. Special care may be needed. Overdosage: If you think you have taken too much of this medicine contact a poison control center or emergency room at once. NOTE: This medicine is only for you. Do not share this medicine with others. What if I miss a dose? Keep appointments for follow-up doses. It is important not to miss your dose. Call your care team if you are unable to keep an appointment. What may interact with this  medication? Ketoconazole Rifampin This list may not describe all possible interactions. Give your health care provider a list of all the medicines, herbs, non-prescription drugs, or dietary supplements you use. Also tell them if you smoke, drink alcohol, or use illegal drugs. Some items may interact with your medicine. What should I watch for while using this medication? Your condition will be monitored carefully while you are receiving this medication. You may need blood work while taking this medication. This medication may affect your coordination, reaction time, or judgment. Do not drive or operate machinery until you know how this medication affects you. Sit up or stand slowly to reduce the risk of dizzy or fainting spells. Drinking alcohol with this medication can increase the risk of these side effects. This medication may increase your risk of getting an infection. Call your care team for advice if you get a fever, chills, sore throat, or other symptoms of a cold or flu. Do not treat yourself. Try to avoid being around people who are sick. Check with your care team if you have severe diarrhea, nausea, and vomiting, or if you sweat a lot. The loss of too much body fluid may make it dangerous for you to take this medication. Talk to your care team if you may be pregnant. Serious birth defects can occur if you take this medication  during pregnancy and for 7 months after the last dose. You will need a negative pregnancy test before starting this medication. Contraception is recommended while taking this medication and for 7 months after the last dose. Your care team can help you find the option that works for you. If your partner can get pregnant, use a condom during sex while taking this medication and for 4 months after the last dose. Do not breastfeed while taking this medication and for 2 months after the last dose. This medication may cause infertility. Talk to your care team if you are  concerned about your fertility. What side effects may I notice from receiving this medication? Side effects that you should report to your care team as soon as possible: Allergic reactions--skin rash, itching, hives, swelling of the face, lips, tongue, or throat Bleeding--bloody or black, tar-like stools, vomiting blood or brown material that looks like coffee grounds, red or dark brown urine, small red or purple spots on skin, unusual bruising or bleeding Bleeding in the brain--severe headache, stiff neck, confusion, dizziness, change in vision, numbness or weakness of the face, arm, or leg, trouble speaking, trouble walking, vomiting Bowel blockage--stomach cramping, unable to have a bowel movement or pass gas, loss of appetite, vomiting Heart failure--shortness of breath, swelling of the ankles, feet, or hands, sudden weight gain, unusual weakness or fatigue Infection--fever, chills, cough, sore throat, wounds that don't heal, pain or trouble when passing urine, general feeling of discomfort or being unwell Liver injury--right upper belly pain, loss of appetite, nausea, light-colored stool, dark yellow or brown urine, yellowing skin or eyes, unusual weakness or fatigue Low blood pressure--dizziness, feeling faint or lightheaded, blurry vision Lung injury--shortness of breath or trouble breathing, cough, spitting up blood, chest pain, fever Pain, tingling, or numbness in the hands or feet Severe or prolonged diarrhea Stomach pain, bloody diarrhea, pale skin, unusual weakness or fatigue, decrease in the amount of urine, which may be signs of hemolytic uremic syndrome Sudden and severe headache, confusion, change in vision, seizures, which may be signs of posterior reversible encephalopathy syndrome (PRES) TTP--purple spots on the skin or inside the mouth, pale skin, yellowing skin or eyes, unusual weakness or fatigue, fever, fast or irregular heartbeat, confusion, change in vision, trouble speaking,  trouble walking Tumor lysis syndrome (TLS)--nausea, vomiting, diarrhea, decrease in the amount of urine, dark urine, unusual weakness or fatigue, confusion, muscle pain or cramps, fast or irregular heartbeat, joint pain Side effects that usually do not require medical attention (report to your care team if they continue or are bothersome): Constipation Diarrhea Fatigue Loss of appetite Nausea This list may not describe all possible side effects. Call your doctor for medical advice about side effects. You may report side effects to FDA at 1-800-FDA-1088. Where should I keep my medication? This medication is given in a hospital or clinic. It will not be stored at home. NOTE: This sheet is a summary. It may not cover all possible information. If you have questions about this medicine, talk to your doctor, pharmacist, or health care provider.  2024 Elsevier/Gold Standard (2021-06-02 00:00:00)

## 2022-08-11 NOTE — Progress Notes (Signed)
Okay to treat with SCr 1.58 per Dr. Myna Hidalgo. Treatment parameters changed to treat with SCr <2 per Dr. Gustavo Lah instructions.

## 2022-08-11 NOTE — Progress Notes (Signed)
. Hematology and Oncology Follow Up Visit  James Marks [id number] 17-Jun-1940 82 y.o. 08/11/2022   Principle Diagnosis:  IgA kappa  myeloma  Current Therapy:   Velcade/Revlimid/Decadron -- s/p cycle #6 -- start on 07/27/2021 Faspro/Velcade/Revlimid/Decadron -- s/p cycle #7 - start  on 01/28/2022 --Revlimid  DC'd on 02/24/2022     Interim History:  James Marks is back for follow-up.  He seems to be doing quite well right now.  He has been playing golf.  He thought he was go play golf yesterday but the rain prevents from happening.  He is eating well.  He is having no problems with nausea or vomiting.  He is having no bleeding.  There is no rashes.  He has had no cough or chest wall pain.  As far as his myeloma is concerned, he is doing quite well with this.  The monoclonal studies that were done back in early July showed a monoclonal spike 0.2 g/dL.  His IgA level was 120 mg/dL.  The Kappa light chain was 3.2 mg/dL.  Currently, I would say that his performance status is ECOG 1.     Medications:  Current Outpatient Medications:    acetaminophen (TYLENOL) 325 MG tablet, Take 325 mg by mouth. 02/10/2022 Takes 325 mg one hour prior to antibody treatment., Disp: , Rfl:    acyclovir (ZOVIRAX) 400 MG tablet, TAKE 1 TABLET(400 MG) BY MOUTH TWICE DAILY, Disp: 180 tablet, Rfl: 4   amLODipine (NORVASC) 2.5 MG tablet, Take 1 tablet (2.5 mg total) by mouth daily., Disp: 90 tablet, Rfl: 3   BABY ASPIRIN PO, Take 81 mg by mouth daily., Disp: , Rfl:    dexamethasone (DECADRON) 4 MG tablet, TAKE 5 TABLETS BY MOUTH 1 HOUR PRIOR TO CHEMO TREATMENT, Disp: 450 tablet, Rfl: 0   diphenhydrAMINE (BENADRYL) 25 mg capsule, Take 50 mg by mouth. 02/10/2022 Takes one hour prior to antibody treatment., Disp: , Rfl:    gabapentin (NEURONTIN) 300 MG capsule, TAKE 1 CAPSULE BY MOUTH EVERY DAY WITH BREAKFAST, 1 CAPSULE WITH DINNER AND 2 CAPSULES AT BEDTIME, Disp: 360 capsule, Rfl: 4   losartan (COZAAR) 50 MG tablet,  TAKE 1 TABLET BY MOUTH DAILY, Disp: 90 tablet, Rfl: 3   montelukast (SINGULAIR) 10 MG tablet, Take 1 tablet (10 mg total) by mouth at bedtime., Disp: 30 tablet, Rfl: 2   Multiple Vitamins-Minerals (PRESERVISION AREDS 2 PO), Take by mouth daily at 6 (six) AM., Disp: , Rfl:    traMADol (ULTRAM) 50 MG tablet, Take 1 tablet (50 mg total) by mouth every 6 (six) hours as needed. You can take 1-2, if needed, every 6 hours for pain, Disp: 60 tablet, Rfl: 0   amoxicillin (AMOXIL) 500 MG capsule, Take four tablets (2000 mg) total one hour prior to dental procedure. (Patient not taking: Reported on 03/25/2022), Disp: 4 capsule, Rfl: 0   ondansetron (ZOFRAN) 8 MG tablet, Take 1 tablet (8 mg total) by mouth every 8 (eight) hours as needed for nausea or vomiting. (Patient not taking: Reported on 08/11/2022), Disp: 30 tablet, Rfl: 1   prochlorperazine (COMPAZINE) 10 MG tablet, Take 1 tablet (10 mg total) by mouth every 6 (six) hours as needed for nausea or vomiting. (Patient not taking: Reported on 04/21/2022), Disp: 30 tablet, Rfl: 1   traZODone (DESYREL) 50 MG tablet, Take 1 tablet (50 mg total) by mouth at bedtime. (Patient not taking: Reported on 05/19/2022), Disp: 30 tablet, Rfl: 3 No current facility-administered medications for this visit.  Facility-Administered Medications  Ordered in Other Visits:    0.9 %  sodium chloride infusion, , Intravenous, Continuous, Brix Brearley, Rose Phi, MD, Stopped at 08/05/14 1710  Allergies: No Known Allergies  Past Medical History, Surgical history, Social history, and Family History were reviewed and updated.  Review of Systems: Review of Systems  Constitutional:  Positive for malaise/fatigue.  HENT:  Positive for congestion.   Eyes: Negative.   Respiratory:  Positive for shortness of breath.   Cardiovascular:  Positive for palpitations.  Gastrointestinal: Negative.   Genitourinary: Negative.   Musculoskeletal:  Positive for back pain and myalgias.  Skin: Negative.    Neurological:  Positive for tingling.  Endo/Heme/Allergies: Negative.   Psychiatric/Behavioral: Negative.      Physical Exam:  weight is 170 lb 8 oz (77.3 kg). His oral temperature is 97.9 F (36.6 C). His blood pressure is 129/57 (abnormal) and his pulse is 61. His respiration is 18 and oxygen saturation is 100%.   Wt Readings from Last 3 Encounters:  08/11/22 170 lb 8 oz (77.3 kg)  07/14/22 170 lb (77.1 kg)  06/30/22 172 lb (78 kg)     Physical Exam Vitals reviewed.  HENT:     Head: Normocephalic and atraumatic.  Eyes:     Pupils: Pupils are equal, round, and reactive to light.  Cardiovascular:     Rate and Rhythm: Normal rate and regular rhythm.     Heart sounds: Normal heart sounds.  Pulmonary:     Effort: Pulmonary effort is normal.     Breath sounds: Normal breath sounds.  Abdominal:     General: Bowel sounds are normal.     Palpations: Abdomen is soft.  Musculoskeletal:        General: No tenderness or deformity. Normal range of motion.     Cervical back: Normal range of motion.  Lymphadenopathy:     Cervical: No cervical adenopathy.  Skin:    General: Skin is warm and dry.     Findings: No erythema or rash.  Neurological:     Mental Status: He is alert and oriented to person, place, and time.  Psychiatric:        Behavior: Behavior normal.        Thought Content: Thought content normal.        Judgment: Judgment normal.      Lab Results  Component Value Date   WBC 8.0 08/11/2022   HGB 12.7 (L) 08/11/2022   HCT 39.8 08/11/2022   MCV 93.6 08/11/2022   PLT 165 08/11/2022     Chemistry      Component Value Date/Time   NA 143 08/11/2022 0926   NA 143 12/06/2016 1156   NA 139 08/02/2016 1146   K 5.2 (H) 08/11/2022 0926   K 4.2 12/06/2016 1156   K 4.7 08/02/2016 1146   CL 107 08/11/2022 0926   CL 107 12/06/2016 1156   CO2 29 08/11/2022 0926   CO2 28 12/06/2016 1156   CO2 24 08/02/2016 1146   BUN 23 08/11/2022 0926   BUN 29 (H) 12/06/2016 1156    BUN 34.3 (H) 08/02/2016 1146   CREATININE 1.58 (H) 08/11/2022 0926   CREATININE 2.1 (H) 12/06/2016 1156   CREATININE 1.7 (H) 08/02/2016 1146      Component Value Date/Time   CALCIUM 9.2 08/11/2022 0926   CALCIUM 9.1 12/06/2016 1156   CALCIUM 9.5 08/02/2016 1146   ALKPHOS 105 08/11/2022 0926   ALKPHOS 108 (H) 12/06/2016 1156   ALKPHOS 115 08/02/2016 1146  AST 11 (L) 08/11/2022 0926   AST 14 08/02/2016 1146   ALT 7 08/11/2022 0926   ALT 18 12/06/2016 1156   ALT 9 08/02/2016 1146   BILITOT 0.4 08/11/2022 0926   BILITOT 0.41 08/02/2016 1146      Impression and Plan:  James Marks is 82 year old man with IgA kappa myeloma.  He is on fairly aggressive therapy right now.  We had added the Faspro.  I think adding the Faspro clearly helped him out.  His Kappa light chain has responded nicely.  We will see what today's level is.  I will keep him on the same schedule as he is he has been on.  I will have Velcade every 2 weeks.  He receives the [employer] once a month.  I will plan to see him back myself in 1 month.  He will have the Velcade in 2 weeks.   [name], MD 7/31/202410:30 AM

## 2022-08-11 NOTE — Progress Notes (Signed)
CBC and CMET reviewed by MD, VO ok to treat despite counts.

## 2022-08-13 ENCOUNTER — Encounter: Payer: Self-pay | Admitting: *Deleted

## 2022-08-13 ENCOUNTER — Other Ambulatory Visit: Payer: Self-pay

## 2022-08-25 ENCOUNTER — Inpatient Hospital Stay: Payer: Medicare Other

## 2022-08-25 ENCOUNTER — Inpatient Hospital Stay: Payer: Medicare Other | Attending: Hematology & Oncology

## 2022-08-25 VITALS — BP 129/69 | HR 65 | Temp 98.1°F | Resp 17

## 2022-08-25 DIAGNOSIS — G629 Polyneuropathy, unspecified: Secondary | ICD-10-CM | POA: Insufficient documentation

## 2022-08-25 DIAGNOSIS — D472 Monoclonal gammopathy: Secondary | ICD-10-CM

## 2022-08-25 DIAGNOSIS — Z79899 Other long term (current) drug therapy: Secondary | ICD-10-CM | POA: Insufficient documentation

## 2022-08-25 DIAGNOSIS — Z5112 Encounter for antineoplastic immunotherapy: Secondary | ICD-10-CM | POA: Diagnosis present

## 2022-08-25 DIAGNOSIS — C9000 Multiple myeloma not having achieved remission: Secondary | ICD-10-CM | POA: Diagnosis present

## 2022-08-25 LAB — CBC WITH DIFFERENTIAL (CANCER CENTER ONLY)
Abs Immature Granulocytes: 0.01 10*3/uL (ref 0.00–0.07)
Basophils Absolute: 0 10*3/uL (ref 0.0–0.1)
Basophils Relative: 0 %
Eosinophils Absolute: 0.1 10*3/uL (ref 0.0–0.5)
Eosinophils Relative: 1 %
HCT: 38.6 % — ABNORMAL LOW (ref 39.0–52.0)
Hemoglobin: 12.5 g/dL — ABNORMAL LOW (ref 13.0–17.0)
Immature Granulocytes: 0 %
Lymphocytes Relative: 16 %
Lymphs Abs: 1.3 10*3/uL (ref 0.7–4.0)
MCH: 30.5 pg (ref 26.0–34.0)
MCHC: 32.4 g/dL (ref 30.0–36.0)
MCV: 94.1 fL (ref 80.0–100.0)
Monocytes Absolute: 0.4 10*3/uL (ref 0.1–1.0)
Monocytes Relative: 5 %
Neutro Abs: 6 10*3/uL (ref 1.7–7.7)
Neutrophils Relative %: 78 %
Platelet Count: 166 10*3/uL (ref 150–400)
RBC: 4.1 MIL/uL — ABNORMAL LOW (ref 4.22–5.81)
RDW: 13.9 % (ref 11.5–15.5)
WBC Count: 7.7 10*3/uL (ref 4.0–10.5)
nRBC: 0 % (ref 0.0–0.2)

## 2022-08-25 LAB — CMP (CANCER CENTER ONLY)
ALT: 7 U/L (ref 0–44)
AST: 10 U/L — ABNORMAL LOW (ref 15–41)
Albumin: 4 g/dL (ref 3.5–5.0)
Alkaline Phosphatase: 99 U/L (ref 38–126)
Anion gap: 5 (ref 5–15)
BUN: 24 mg/dL — ABNORMAL HIGH (ref 8–23)
CO2: 29 mmol/L (ref 22–32)
Calcium: 9 mg/dL (ref 8.9–10.3)
Chloride: 107 mmol/L (ref 98–111)
Creatinine: 1.45 mg/dL — ABNORMAL HIGH (ref 0.61–1.24)
GFR, Estimated: 48 mL/min — ABNORMAL LOW (ref >60)
Glucose, Bld: 159 mg/dL — ABNORMAL HIGH (ref 70–99)
Potassium: 5.1 mmol/L (ref 3.5–5.1)
Sodium: 141 mmol/L (ref 135–145)
Total Bilirubin: 0.4 mg/dL (ref 0.3–1.2)
Total Protein: 6.2 g/dL — ABNORMAL LOW (ref 6.5–8.1)

## 2022-08-25 MED ORDER — DEXAMETHASONE 4 MG PO TABS: 20.0000 mg | ORAL_TABLET | Freq: Once | ORAL | Status: DC

## 2022-08-25 MED ORDER — BORTEZOMIB CHEMO SQ INJECTION 3.5 MG (2.5MG/ML)
1.3000 mg/m2 | Freq: Once | INTRAMUSCULAR | Status: AC
  Administered 2022-08-25: 2.5 mg via SUBCUTANEOUS
  Filled 2022-08-25: qty 1

## 2022-08-25 NOTE — Patient Instructions (Signed)
[facility]  Discharge Instructions: Thank you for choosing [facility] to provide your oncology and hematology care.   If you have a lab appointment with the Cancer Center, please go directly to the Cancer Center and check in at the registration area.  Wear comfortable clothing and clothing appropriate for easy access to any Portacath or PICC line.   We strive to give you quality time with your provider. You may need to reschedule your appointment if you arrive late (15 or more minutes).  Arriving late affects you and other patients whose appointments are after yours.  Also, if you miss three or more appointments without notifying the office, you may be dismissed from the clinic at the provider's discretion.      For prescription refill requests, have your pharmacy contact our office and allow 72 hours for refills to be completed.    Today you received the following chemotherapy and/or immunotherapy agents Velcade.      To help prevent nausea and vomiting after your treatment, we encourage you to take your nausea medication as directed.  BELOW ARE SYMPTOMS THAT SHOULD BE REPORTED IMMEDIATELY: *FEVER GREATER THAN 100.4 F (38 C) OR HIGHER *CHILLS OR SWEATING *NAUSEA AND VOMITING THAT IS NOT CONTROLLED WITH YOUR NAUSEA MEDICATION *UNUSUAL SHORTNESS OF BREATH *UNUSUAL BRUISING OR BLEEDING *URINARY PROBLEMS (pain or burning when urinating, or frequent urination) *BOWEL PROBLEMS (unusual diarrhea, constipation, pain near the anus) TENDERNESS IN MOUTH AND THROAT WITH OR WITHOUT PRESENCE OF ULCERS (sore throat, sores in mouth, or a toothache) UNUSUAL RASH, SWELLING OR PAIN  UNUSUAL VAGINAL DISCHARGE OR ITCHING   Items with * indicate a potential emergency and should be followed up as soon as possible or go to the Emergency Department if any problems should occur.  Please show the CHEMOTHERAPY ALERT CARD or IMMUNOTHERAPY ALERT CARD at check-in  to the Emergency Department and triage nurse. Should you have questions after your visit or need to cancel or reschedule your appointment, please contact [facility]  [phone] and follow the prompts.  Office hours are 8:00 a.m. to 4:30 p.m. Monday - Friday. Please note that voicemails left after 4:00 p.m. may not be returned until the following business day.  We are closed weekends and major holidays. You have access to a nurse at all times for urgent questions. Please call the main number to the clinic [phone] and follow the prompts.  For any non-urgent questions, you may also contact your provider using MyChart. We now offer e-Visits for anyone 18 and older to request care online for non-urgent symptoms. For details visit [email or web address].   Also download the MyChart app! Go to the app store, search "MyChart", open the app, select [facility], and log in with your MyChart username and password.   

## 2022-09-07 ENCOUNTER — Other Ambulatory Visit: Payer: Self-pay | Admitting: Family Medicine

## 2022-09-08 ENCOUNTER — Inpatient Hospital Stay: Payer: Medicare Other

## 2022-09-08 ENCOUNTER — Encounter: Payer: Self-pay | Admitting: Family

## 2022-09-08 ENCOUNTER — Other Ambulatory Visit: Payer: Self-pay | Admitting: *Deleted

## 2022-09-08 ENCOUNTER — Inpatient Hospital Stay (HOSPITAL_BASED_OUTPATIENT_CLINIC_OR_DEPARTMENT_OTHER): Payer: Medicare Other | Admitting: Family

## 2022-09-08 VITALS — BP 160/74 | HR 51 | Temp 97.5°F | Resp 17 | Wt 172.1 lb

## 2022-09-08 DIAGNOSIS — D472 Monoclonal gammopathy: Secondary | ICD-10-CM

## 2022-09-08 DIAGNOSIS — Z5112 Encounter for antineoplastic immunotherapy: Secondary | ICD-10-CM | POA: Diagnosis not present

## 2022-09-08 LAB — CBC WITH DIFFERENTIAL (CANCER CENTER ONLY)
Abs Immature Granulocytes: 0.01 10*3/uL (ref 0.00–0.07)
Basophils Absolute: 0 10*3/uL (ref 0.0–0.1)
Basophils Relative: 1 %
Eosinophils Absolute: 0.1 10*3/uL (ref 0.0–0.5)
Eosinophils Relative: 2 %
HCT: 39.6 % (ref 39.0–52.0)
Hemoglobin: 12.4 g/dL — ABNORMAL LOW (ref 13.0–17.0)
Immature Granulocytes: 0 %
Lymphocytes Relative: 20 %
Lymphs Abs: 1.2 10*3/uL (ref 0.7–4.0)
MCH: 29.7 pg (ref 26.0–34.0)
MCHC: 31.3 g/dL (ref 30.0–36.0)
MCV: 94.7 fL (ref 80.0–100.0)
Monocytes Absolute: 0.4 10*3/uL (ref 0.1–1.0)
Monocytes Relative: 7 %
Neutro Abs: 4.3 10*3/uL (ref 1.7–7.7)
Neutrophils Relative %: 70 %
Platelet Count: 174 10*3/uL (ref 150–400)
RBC: 4.18 MIL/uL — ABNORMAL LOW (ref 4.22–5.81)
RDW: 14.2 % (ref 11.5–15.5)
WBC Count: 6 10*3/uL (ref 4.0–10.5)
nRBC: 0 % (ref 0.0–0.2)

## 2022-09-08 LAB — CMP (CANCER CENTER ONLY)
ALT: 8 U/L (ref 0–44)
AST: 11 U/L — ABNORMAL LOW (ref 15–41)
Albumin: 4.2 g/dL (ref 3.5–5.0)
Alkaline Phosphatase: 106 U/L (ref 38–126)
Anion gap: 7 (ref 5–15)
BUN: 19 mg/dL (ref 8–23)
CO2: 31 mmol/L (ref 22–32)
Calcium: 9.1 mg/dL (ref 8.9–10.3)
Chloride: 106 mmol/L (ref 98–111)
Creatinine: 1.36 mg/dL — ABNORMAL HIGH (ref 0.61–1.24)
GFR, Estimated: 52 mL/min — ABNORMAL LOW (ref >60)
Glucose, Bld: 137 mg/dL — ABNORMAL HIGH (ref 70–99)
Potassium: 4.4 mmol/L (ref 3.5–5.1)
Sodium: 144 mmol/L (ref 135–145)
Total Bilirubin: 0.5 mg/dL (ref 0.3–1.2)
Total Protein: 6.2 g/dL — ABNORMAL LOW (ref 6.5–8.1)

## 2022-09-08 LAB — LACTATE DEHYDROGENASE: LDH: 116 U/L (ref 98–192)

## 2022-09-08 MED ORDER — DARATUMUMAB-HYALURONIDASE-FIHJ 1800-30000 MG-UT/15ML ~~LOC~~ SOLN
1800.0000 mg | Freq: Once | SUBCUTANEOUS | Status: AC
  Administered 2022-09-08: 1800 mg via SUBCUTANEOUS
  Filled 2022-09-08: qty 15

## 2022-09-08 MED ORDER — DEXAMETHASONE 4 MG PO TABS: 20.0000 mg | ORAL_TABLET | Freq: Once | ORAL | Status: DC

## 2022-09-08 MED ORDER — GABAPENTIN 300 MG PO CAPS: ORAL_CAPSULE | ORAL | 4 refills | Status: DC

## 2022-09-08 MED ORDER — ACETAMINOPHEN 325 MG PO TABS: 650.0000 mg | ORAL_TABLET | Freq: Once | ORAL | Status: DC

## 2022-09-08 MED ORDER — BORTEZOMIB CHEMO SQ INJECTION 3.5 MG (2.5MG/ML)
1.3000 mg/m2 | Freq: Once | INTRAMUSCULAR | Status: AC
  Administered 2022-09-08: 2.5 mg via SUBCUTANEOUS
  Filled 2022-09-08: qty 1

## 2022-09-08 MED ORDER — DIPHENHYDRAMINE HCL 25 MG PO CAPS: 50.0000 mg | ORAL_CAPSULE | Freq: Once | ORAL | Status: DC

## 2022-09-08 NOTE — Progress Notes (Signed)
Hematology and Oncology Follow Up Visit  James Marks [id number] 11-29-1940 82 y.o. 09/08/2022   Principle Diagnosis:  IgA kappa  myeloma   Current Therapy:        Velcade/Revlimid/Decadron -- s/p cycle #6 -- start on 07/27/2021 Faspro/Velcade/Revlimid/Decadron -- s/p cycle 8 - start  on 01/28/2022 -- Revlimid  DC'd on 02/24/2022   Interim History:  James Marks is here today with his wife for follow-up and treatment. He is doing well but notes that he can not sleep after taking her decadron for treatment. I spoke with Dr. Myna Hidalgo and we will have him take Trazodone 100 mg PO at bedtime on those nights.  M-spike last month was stable at 0.2 g/dL, IgA level was 440 mg/dL and kappa light chains 3.36 mg/dL.  No fever, chills, n/v, couhg, rash, SOB, chest pain, palpitations, abdominal pain or changes in bowel or bladder habits.  No swelling in his extremities. Pedal pulses are 1+.  Neuropathy present in both feet. Patient notes that this seems a little worse at times.  No recent falls, no syncope.  Appetite is good. He is doing his best to better hydrate while out golfing in the summer heat.  Weight is 172 lbs.   ECOG Performance Status: 1 - Symptomatic but completely ambulatory  Medications:  Allergies as of 09/08/2022   No Known Allergies      Medication List        Accurate as of September 08, 2022  9:23 AM. If you have any questions, ask your nurse or doctor.          acetaminophen 325 MG tablet Commonly known as: TYLENOL Take 325 mg by mouth. 02/10/2022 Takes 325 mg one hour prior to antibody treatment.   acyclovir 400 MG tablet Commonly known as: ZOVIRAX TAKE 1 TABLET(400 MG) BY MOUTH TWICE DAILY   amLODipine 2.5 MG tablet Commonly known as: NORVASC TAKE 1 TABLET(2.5 MG) BY MOUTH DAILY   amoxicillin 500 MG capsule Commonly known as: AMOXIL Take four tablets (2000 mg) total one hour prior to dental procedure.   BABY ASPIRIN PO Take 81 mg by mouth daily.    dexamethasone 4 MG tablet Commonly known as: DECADRON TAKE 5 TABLETS BY MOUTH 1 HOUR PRIOR TO CHEMO TREATMENT   diphenhydrAMINE 25 mg capsule Commonly known as: BENADRYL Take 50 mg by mouth. 02/10/2022 Takes one hour prior to antibody treatment.   gabapentin 300 MG capsule Commonly known as: NEURONTIN TAKE 1 CAPSULE BY MOUTH EVERY DAY WITH BREAKFAST, 1 CAPSULE WITH DINNER AND 2 CAPSULES AT BEDTIME   losartan 50 MG tablet Commonly known as: COZAAR TAKE 1 TABLET BY MOUTH DAILY   montelukast 10 MG tablet Commonly known as: Singulair Take 1 tablet (10 mg total) by mouth at bedtime.   ondansetron 8 MG tablet Commonly known as: Zofran Take 1 tablet (8 mg total) by mouth every 8 (eight) hours as needed for nausea or vomiting.   PRESERVISION AREDS 2 PO Take by mouth daily at 6 (six) AM.   prochlorperazine 10 MG tablet Commonly known as: COMPAZINE Take 1 tablet (10 mg total) by mouth every 6 (six) hours as needed for nausea or vomiting.   traMADol 50 MG tablet Commonly known as: ULTRAM Take 1 tablet (50 mg total) by mouth every 6 (six) hours as needed. You can take 1-2, if needed, every 6 hours for pain   traZODone 50 MG tablet Commonly known as: DESYREL Take 1 tablet (50 mg total) by mouth at bedtime.  Allergies: No Known Allergies  Past Medical History, Surgical history, Social history, and Family History were reviewed and updated.  Review of Systems: All other 10 point review of systems is negative.   Physical Exam:  vitals were not taken for this visit.   Wt Readings from Last 3 Encounters:  08/11/22 170 lb 8 oz (77.3 kg)  07/14/22 170 lb (77.1 kg)  06/30/22 172 lb (78 kg)    Ocular: Sclerae unicteric, pupils equal, round and reactive to light Ear-nose-throat: Oropharynx clear, dentition fair Lymphatic: No cervical or supraclavicular adenopathy Lungs no rales or rhonchi, good excursion bilaterally Heart regular rate and rhythm, no murmur  appreciated Abd soft, nontender, positive bowel sounds MSK no focal spinal tenderness, no joint edema Neuro: non-focal, well-oriented, appropriate affect Breasts: Deferred   Lab Results  Component Value Date   WBC 6.0 09/08/2022   HGB 12.4 (L) 09/08/2022   HCT 39.6 09/08/2022   MCV 94.7 09/08/2022   PLT 174 09/08/2022   Lab Results  Component Value Date   FERRITIN 79 12/16/2021   IRON 57 03/25/2022   TIBC 309 03/25/2022   UIBC 252 03/25/2022   IRONPCTSAT 18 03/25/2022   Lab Results  Component Value Date   RETICCTPCT 1.0 12/02/2021   RBC 4.18 (L) 09/08/2022   Lab Results  Component Value Date   KPAFRELGTCHN 33.6 (H) 08/11/2022   LAMBDASER 14.4 08/11/2022   KAPLAMBRATIO 2.33 (H) 08/11/2022   Lab Results  Component Value Date   IGGSERUM 669 08/11/2022   IGGSERUM 705 08/11/2022   IGA 118 08/11/2022   IGA 127 08/11/2022   IGMSERUM 30 08/11/2022   IGMSERUM 35 08/11/2022   Lab Results  Component Value Date   TOTALPROTELP 6.0 08/11/2022   ALBUMINELP 3.6 08/11/2022   A1GS 0.2 08/11/2022   A2GS 0.7 08/11/2022   BETS 0.9 08/11/2022   BETA2SER 0.9 (H) 12/27/2014   GAMS 0.6 08/11/2022   MSPIKE 0.2 (H) 08/11/2022   SPEI Comment 10/14/2021     Chemistry      Component Value Date/Time   NA 141 08/25/2022 0922   NA 143 12/06/2016 1156   NA 139 08/02/2016 1146   K 5.1 08/25/2022 0922   K 4.2 12/06/2016 1156   K 4.7 08/02/2016 1146   CL 107 08/25/2022 0922   CL 107 12/06/2016 1156   CO2 29 08/25/2022 0922   CO2 28 12/06/2016 1156   CO2 24 08/02/2016 1146   BUN 24 (H) 08/25/2022 0922   BUN 29 (H) 12/06/2016 1156   BUN 34.3 (H) 08/02/2016 1146   CREATININE 1.45 (H) 08/25/2022 0922   CREATININE 2.1 (H) 12/06/2016 1156   CREATININE 1.7 (H) 08/02/2016 1146      Component Value Date/Time   CALCIUM 9.0 08/25/2022 0922   CALCIUM 9.1 12/06/2016 1156   CALCIUM 9.5 08/02/2016 1146   ALKPHOS 99 08/25/2022 0922   ALKPHOS 108 (H) 12/06/2016 1156   ALKPHOS 115  08/02/2016 1146   AST 10 (L) 08/25/2022 0922   AST 14 08/02/2016 1146   ALT 7 08/25/2022 0922   ALT 18 12/06/2016 1156   ALT 9 08/02/2016 1146   BILITOT 0.4 08/25/2022 0922   BILITOT 0.41 08/02/2016 1146       Impression and Plan: James Marks is an 82 yo caucasian gentleman with IgA Kappa Myeloma.  So far, he has been tolerating Faspro/Velcade nicely.  Protein studies have remained stable. Today's results are pending.  We will proceed with treatment today as planned.  He will  continue to have lab and receive Velcade every 2 weeks.  MD follow-up, lab and treatment in 4 weeks.   Eileen Stanford, NP 8/28/20249:23 AM

## 2022-09-08 NOTE — Patient Instructions (Addendum)
Bortezomib Injection What is this medication? BORTEZOMIB (bor TEZ oh mib) treats lymphoma. It may also be used to treat multiple myeloma, a type of bone marrow cancer. It works by blocking a protein that causes cancer cells to grow and multiply. This helps to slow or stop the spread of cancer cells. This medicine may be used for other purposes; ask your health care provider or pharmacist if you have questions. COMMON BRAND NAME(S): Velcade What should I tell my care team before I take this medication? They need to know if you have any of these conditions: Dehydration Diabetes Heart disease Liver disease Tingling of the fingers or toes or other nerve disorder An unusual or allergic reaction to bortezomib, other medications, foods, dyes, or preservatives If you or your partner are pregnant or trying to get pregnant Breastfeeding How should I use this medication? This medication is injected into a vein or under the skin. It is given by your care team in a hospital or clinic setting. Talk to your care team about the use of this medication in children. Special care may be needed. Overdosage: If you think you have taken too much of this medicine contact a poison control center or emergency room at once. NOTE: This medicine is only for you. Do not share this medicine with others. What if I miss a dose? Keep appointments for follow-up doses. It is important not to miss your dose. Call your care team if you are unable to keep an appointment. What may interact with this medication? Ketoconazole Rifampin This list may not describe all possible interactions. Give your health care provider a list of all the medicines, herbs, non-prescription drugs, or dietary supplements you use. Also tell them if you smoke, drink alcohol, or use illegal drugs. Some items may interact with your medicine. What should I watch for while using this medication? Your condition will be monitored carefully while you are  receiving this medication. You may need blood work while taking this medication. This medication may affect your coordination, reaction time, or judgment. Do not drive or operate machinery until you know how this medication affects you. Sit up or stand slowly to reduce the risk of dizzy or fainting spells. Drinking alcohol with this medication can increase the risk of these side effects. This medication may increase your risk of getting an infection. Call your care team for advice if you get a fever, chills, sore throat, or other symptoms of a cold or flu. Do not treat yourself. Try to avoid being around people who are sick. Check with your care team if you have severe diarrhea, nausea, and vomiting, or if you sweat a lot. The loss of too much body fluid may make it dangerous for you to take this medication. Talk to your care team if you may be pregnant. Serious birth defects can occur if you take this medication during pregnancy and for 7 months after the last dose. You will need a negative pregnancy test before starting this medication. Contraception is recommended while taking this medication and for 7 months after the last dose. Your care team can help you find the option that works for you. If your partner can get pregnant, use a condom during sex while taking this medication and for 4 months after the last dose. Do not breastfeed while taking this medication and for 2 months after the last dose. This medication may cause infertility. Talk to your care team if you are concerned about your fertility. What side effects  may I notice from receiving this medication? Side effects that you should report to your care team as soon as possible: Allergic reactions--skin rash, itching, hives, swelling of the face, lips, tongue, or throat Bleeding--bloody or black, tar-like stools, vomiting blood or brown material that looks like coffee grounds, red or dark brown urine, small red or purple spots on skin, unusual  bruising or bleeding Bleeding in the brain--severe headache, stiff neck, confusion, dizziness, change in vision, numbness or weakness of the face, arm, or leg, trouble speaking, trouble walking, vomiting Bowel blockage--stomach cramping, unable to have a bowel movement or pass gas, loss of appetite, vomiting Heart failure--shortness of breath, swelling of the ankles, feet, or hands, sudden weight gain, unusual weakness or fatigue Infection--fever, chills, cough, sore throat, wounds that don't heal, pain or trouble when passing urine, general feeling of discomfort or being unwell Liver injury--right upper belly pain, loss of appetite, nausea, light-colored stool, dark yellow or brown urine, yellowing skin or eyes, unusual weakness or fatigue Low blood pressure--dizziness, feeling faint or lightheaded, blurry vision Lung injury--shortness of breath or trouble breathing, cough, spitting up blood, chest pain, fever Pain, tingling, or numbness in the hands or feet Severe or prolonged diarrhea Stomach pain, bloody diarrhea, pale skin, unusual weakness or fatigue, decrease in the amount of urine, which may be signs of hemolytic uremic syndrome Sudden and severe headache, confusion, change in vision, seizures, which may be signs of posterior reversible encephalopathy syndrome (PRES) TTP--purple spots on the skin or inside the mouth, pale skin, yellowing skin or eyes, unusual weakness or fatigue, fever, fast or irregular heartbeat, confusion, change in vision, trouble speaking, trouble walking Tumor lysis syndrome (TLS)--nausea, vomiting, diarrhea, decrease in the amount of urine, dark urine, unusual weakness or fatigue, confusion, muscle pain or cramps, fast or irregular heartbeat, joint pain Side effects that usually do not require medical attention (report to your care team if they continue or are bothersome): Constipation Diarrhea Fatigue Loss of appetite Nausea This list may not describe all possible  side effects. Call your doctor for medical advice about side effects. You may report side effects to FDA at 1-800-FDA-1088. Where should I keep my medication? This medication is given in a hospital or clinic. It will not be stored at home. NOTE: This sheet is a summary. It may not cover all possible information. If you have questions about this medicine, talk to your doctor, pharmacist, or health care provider.  2024 Elsevier/Gold Standard (2021-06-02 00:00:00)     Daratumumab Injection What is this medication? DARATUMUMAB (dar a toom ue mab) treats multiple myeloma, a type of bone marrow cancer. It works by helping your immune system slow or stop the spread of cancer cells. It is a monoclonal antibody. This medicine may be used for other purposes; ask your health care provider or pharmacist if you have questions. COMMON BRAND NAME(S): DARZALEX What should I tell my care team before I take this medication? They need to know if you have any of these conditions: Hereditary fructose intolerance Infection, such as chickenpox, herpes, hepatitis B Lung or breathing disease, such as asthma, COPD An unusual or allergic reaction to daratumumab, sorbitol, other medications, foods, dyes, or preservatives Pregnant or trying to get pregnant Breastfeeding How should I use this medication? This medication is injected into a vein. It is given by your care team in a hospital or clinic setting. Talk to your care team about the use of this medication in children. Special care may be needed. Overdosage: If  you think you have taken too much of this medicine contact a poison control center or emergency room at once. NOTE: This medicine is only for you. Do not share this medicine with others. What if I miss a dose? Keep appointments for follow-up doses. It is important not to miss your dose. Call your care team if you are unable to keep an appointment. What may interact with this medication? Interactions  have not been studied. This list may not describe all possible interactions. Give your health care provider a list of all the medicines, herbs, non-prescription drugs, or dietary supplements you use. Also tell them if you smoke, drink alcohol, or use illegal drugs. Some items may interact with your medicine. What should I watch for while using this medication? Your condition will be monitored carefully while you are receiving this medication. This medication can cause serious allergic reactions. To reduce your risk, your care team may give you other medication to take before receiving this one. Be sure to follow the directions from your care team. This medication can affect the results of blood tests to match your blood type. These changes can last for up to 6 months after the final dose. Your care team will do blood tests to match your blood type before you start treatment. Tell all of your care team that you are being treated with this medication before receiving a blood transfusion. This medication can affect the results of some tests used to determine treatment response; extra tests may be needed to evaluate response. Talk to your care team if you wish to become pregnant or think you are pregnant. This medication can cause serious birth defects if taken during pregnancy and for 3 months after the last dose. A reliable form of contraception is recommended while taking this medication and for 3 months after the last dose. Talk to your care team about effective forms of contraception. Do not breast-feed while taking this medication. What side effects may I notice from receiving this medication? Side effects that you should report to your care team as soon as possible: Allergic reactions--skin rash, itching, hives, swelling of the face, lips, tongue, or throat Infection--fever, chills, cough, sore throat, wounds that don't heal, pain or trouble when passing urine, general feeling of discomfort or being  unwell Infusion reactions--chest pain, shortness of breath or trouble breathing, feeling faint or lightheaded Unusual bruising or bleeding Side effects that usually do not require medical attention (report to your care team if they continue or are bothersome): Constipation Diarrhea Fatigue Nausea Pain, tingling, or numbness in the hands or feet Swelling of the ankles, hands, or feet This list may not describe all possible side effects. Call your doctor for medical advice about side effects. You may report side effects to FDA at 1-800-FDA-1088. Where should I keep my medication? This medication is given in a hospital or clinic. It will not be stored at home. NOTE: This sheet is a summary. It may not cover all possible information. If you have questions about this medicine, talk to your doctor, pharmacist, or health care provider.  2024 Elsevier/Gold Standard (2021-11-05 00:00:00)

## 2022-09-08 NOTE — Addendum Note (Signed)
Addended by: [name] on: 09/08/2022 10:23 AM   Modules accepted: Orders

## 2022-09-09 LAB — IGG, IGA, IGM
IgA: 114 mg/dL (ref 61–437)
IgG (Immunoglobin G), Serum: 616 mg/dL (ref 603–1613)
IgM (Immunoglobulin M), Srm: 29 mg/dL (ref 15–143)

## 2022-09-10 ENCOUNTER — Telehealth: Payer: Self-pay | Admitting: *Deleted

## 2022-09-10 LAB — KAPPA/LAMBDA LIGHT CHAINS
Kappa free light chain: 28.8 mg/L — ABNORMAL HIGH (ref 3.3–19.4)
Kappa, lambda light chain ratio: 2.55 — ABNORMAL HIGH (ref 0.26–1.65)
Lambda free light chains: 11.3 mg/L (ref 5.7–26.3)

## 2022-09-10 NOTE — Telephone Encounter (Signed)
-----   Message from [name] sent at 09/10/2022  6:06 AM EDT ----- Please call and let him know that the light chain still looks fantastic.  Everything still continues to improve.  Thanks.  Cindee Lame

## 2022-09-15 LAB — PROTEIN ELECTROPHORESIS, SERUM, WITH REFLEX
A/G Ratio: 1.5 (ref 0.7–1.7)
Albumin ELP: 3.6 g/dL (ref 2.9–4.4)
Alpha-1-Globulin: 0.2 g/dL (ref 0.0–0.4)
Alpha-2-Globulin: 0.7 g/dL (ref 0.4–1.0)
Beta Globulin: 0.9 g/dL (ref 0.7–1.3)
Gamma Globulin: 0.6 g/dL (ref 0.4–1.8)
Globulin, Total: 2.4 g/dL (ref 2.2–3.9)
M-Spike, %: 0.2 g/dL — ABNORMAL HIGH
SPEP Interpretation: 0
Total Protein ELP: 6 g/dL (ref 6.0–8.5)

## 2022-09-15 LAB — IMMUNOFIXATION REFLEX, SERUM
IgA: 116 mg/dL (ref 61–437)
IgG (Immunoglobin G), Serum: 647 mg/dL (ref 603–1613)
IgM (Immunoglobulin M), Srm: 31 mg/dL (ref 15–143)

## 2022-09-22 ENCOUNTER — Inpatient Hospital Stay: Payer: Medicare Other

## 2022-09-22 ENCOUNTER — Inpatient Hospital Stay: Payer: Medicare Other | Attending: Hematology & Oncology

## 2022-09-22 VITALS — BP 142/64 | HR 59 | Temp 98.4°F | Resp 19

## 2022-09-22 DIAGNOSIS — C9000 Multiple myeloma not having achieved remission: Secondary | ICD-10-CM | POA: Insufficient documentation

## 2022-09-22 DIAGNOSIS — G629 Polyneuropathy, unspecified: Secondary | ICD-10-CM | POA: Diagnosis not present

## 2022-09-22 DIAGNOSIS — Z79899 Other long term (current) drug therapy: Secondary | ICD-10-CM | POA: Insufficient documentation

## 2022-09-22 DIAGNOSIS — D472 Monoclonal gammopathy: Secondary | ICD-10-CM

## 2022-09-22 DIAGNOSIS — Z5112 Encounter for antineoplastic immunotherapy: Secondary | ICD-10-CM | POA: Insufficient documentation

## 2022-09-22 LAB — CBC WITH DIFFERENTIAL (CANCER CENTER ONLY)
Abs Immature Granulocytes: 0.02 10*3/uL (ref 0.00–0.07)
Basophils Absolute: 0 10*3/uL (ref 0.0–0.1)
Basophils Relative: 1 %
Eosinophils Absolute: 0.1 10*3/uL (ref 0.0–0.5)
Eosinophils Relative: 1 %
HCT: 39.5 % (ref 39.0–52.0)
Hemoglobin: 12.6 g/dL — ABNORMAL LOW (ref 13.0–17.0)
Immature Granulocytes: 0 %
Lymphocytes Relative: 20 %
Lymphs Abs: 1.6 10*3/uL (ref 0.7–4.0)
MCH: 29.9 pg (ref 26.0–34.0)
MCHC: 31.9 g/dL (ref 30.0–36.0)
MCV: 93.6 fL (ref 80.0–100.0)
Monocytes Absolute: 0.6 10*3/uL (ref 0.1–1.0)
Monocytes Relative: 7 %
Neutro Abs: 5.8 10*3/uL (ref 1.7–7.7)
Neutrophils Relative %: 71 %
Platelet Count: 186 10*3/uL (ref 150–400)
RBC: 4.22 MIL/uL (ref 4.22–5.81)
RDW: 14 % (ref 11.5–15.5)
WBC Count: 8.1 10*3/uL (ref 4.0–10.5)
nRBC: 0 % (ref 0.0–0.2)

## 2022-09-22 LAB — CMP (CANCER CENTER ONLY)
ALT: 8 U/L (ref 0–44)
AST: 11 U/L — ABNORMAL LOW (ref 15–41)
Albumin: 4.4 g/dL (ref 3.5–5.0)
Alkaline Phosphatase: 107 U/L (ref 38–126)
Anion gap: 6 (ref 5–15)
BUN: 27 mg/dL — ABNORMAL HIGH (ref 8–23)
CO2: 29 mmol/L (ref 22–32)
Calcium: 9.4 mg/dL (ref 8.9–10.3)
Chloride: 106 mmol/L (ref 98–111)
Creatinine: 1.64 mg/dL — ABNORMAL HIGH (ref 0.61–1.24)
GFR, Estimated: 41 mL/min — ABNORMAL LOW (ref >60)
Glucose, Bld: 140 mg/dL — ABNORMAL HIGH (ref 70–99)
Potassium: 4.9 mmol/L (ref 3.5–5.1)
Sodium: 141 mmol/L (ref 135–145)
Total Bilirubin: 0.5 mg/dL (ref 0.3–1.2)
Total Protein: 6.5 g/dL (ref 6.5–8.1)

## 2022-09-22 MED ORDER — DEXAMETHASONE 4 MG PO TABS: 20.0000 mg | ORAL_TABLET | Freq: Once | ORAL | Status: DC

## 2022-09-22 MED ORDER — BORTEZOMIB CHEMO SQ INJECTION 3.5 MG (2.5MG/ML)
1.3000 mg/m2 | Freq: Once | INTRAMUSCULAR | Status: AC
  Administered 2022-09-22: 2.5 mg via SUBCUTANEOUS
  Filled 2022-09-22: qty 1

## 2022-09-22 NOTE — Patient Instructions (Signed)
Bortezomib Injection What is this medication? BORTEZOMIB (bor TEZ oh mib) treats lymphoma. It may also be used to treat multiple myeloma, a type of bone marrow cancer. It works by blocking a protein that causes cancer cells to grow and multiply. This helps to slow or stop the spread of cancer cells. This medicine may be used for other purposes; ask your health care provider or pharmacist if you have questions. COMMON BRAND NAME(S): Velcade What should I tell my care team before I take this medication? They need to know if you have any of these conditions: Dehydration Diabetes Heart disease Liver disease Tingling of the fingers or toes or other nerve disorder An unusual or allergic reaction to bortezomib, other medications, foods, dyes, or preservatives If you or your partner are pregnant or trying to get pregnant Breastfeeding How should I use this medication? This medication is injected into a vein or under the skin. It is given by your care team in a hospital or clinic setting. Talk to your care team about the use of this medication in children. Special care may be needed. Overdosage: If you think you have taken too much of this medicine contact a poison control center or emergency room at once. NOTE: This medicine is only for you. Do not share this medicine with others. What if I miss a dose? Keep appointments for follow-up doses. It is important not to miss your dose. Call your care team if you are unable to keep an appointment. What may interact with this medication? Ketoconazole Rifampin This list may not describe all possible interactions. Give your health care provider a list of all the medicines, herbs, non-prescription drugs, or dietary supplements you use. Also tell them if you smoke, drink alcohol, or use illegal drugs. Some items may interact with your medicine. What should I watch for while using this medication? Your condition will be monitored carefully while you are  receiving this medication. You may need blood work while taking this medication. This medication may affect your coordination, reaction time, or judgment. Do not drive or operate machinery until you know how this medication affects you. Sit up or stand slowly to reduce the risk of dizzy or fainting spells. Drinking alcohol with this medication can increase the risk of these side effects. This medication may increase your risk of getting an infection. Call your care team for advice if you get a fever, chills, sore throat, or other symptoms of a cold or flu. Do not treat yourself. Try to avoid being around people who are sick. Check with your care team if you have severe diarrhea, nausea, and vomiting, or if you sweat a lot. The loss of too much body fluid may make it dangerous for you to take this medication. Talk to your care team if you may be pregnant. Serious birth defects can occur if you take this medication during pregnancy and for 7 months after the last dose. You will need a negative pregnancy test before starting this medication. Contraception is recommended while taking this medication and for 7 months after the last dose. Your care team can help you find the option that works for you. If your partner can get pregnant, use a condom during sex while taking this medication and for 4 months after the last dose. Do not breastfeed while taking this medication and for 2 months after the last dose. This medication may cause infertility. Talk to your care team if you are concerned about your fertility. What side effects   may I notice from receiving this medication? Side effects that you should report to your care team as soon as possible: Allergic reactions--skin rash, itching, hives, swelling of the face, lips, tongue, or throat Bleeding--bloody or black, tar-like stools, vomiting blood or brown material that looks like coffee grounds, red or dark brown urine, small red or purple spots on skin, unusual  bruising or bleeding Bleeding in the brain--severe headache, stiff neck, confusion, dizziness, change in vision, numbness or weakness of the face, arm, or leg, trouble speaking, trouble walking, vomiting Bowel blockage--stomach cramping, unable to have a bowel movement or pass gas, loss of appetite, vomiting Heart failure--shortness of breath, swelling of the ankles, feet, or hands, sudden weight gain, unusual weakness or fatigue Infection--fever, chills, cough, sore throat, wounds that don't heal, pain or trouble when passing urine, general feeling of discomfort or being unwell Liver injury--right upper belly pain, loss of appetite, nausea, light-colored stool, dark yellow or brown urine, yellowing skin or eyes, unusual weakness or fatigue Low blood pressure--dizziness, feeling faint or lightheaded, blurry vision Lung injury--shortness of breath or trouble breathing, cough, spitting up blood, chest pain, fever Pain, tingling, or numbness in the hands or feet Severe or prolonged diarrhea Stomach pain, bloody diarrhea, pale skin, unusual weakness or fatigue, decrease in the amount of urine, which may be signs of hemolytic uremic syndrome Sudden and severe headache, confusion, change in vision, seizures, which may be signs of posterior reversible encephalopathy syndrome (PRES) TTP--purple spots on the skin or inside the mouth, pale skin, yellowing skin or eyes, unusual weakness or fatigue, fever, fast or irregular heartbeat, confusion, change in vision, trouble speaking, trouble walking Tumor lysis syndrome (TLS)--nausea, vomiting, diarrhea, decrease in the amount of urine, dark urine, unusual weakness or fatigue, confusion, muscle pain or cramps, fast or irregular heartbeat, joint pain Side effects that usually do not require medical attention (report to your care team if they continue or are bothersome): Constipation Diarrhea Fatigue Loss of appetite Nausea This list may not describe all possible  side effects. Call your doctor for medical advice about side effects. You may report side effects to FDA at 1-800-FDA-1088. Where should I keep my medication? This medication is given in a hospital or clinic. It will not be stored at home. NOTE: This sheet is a summary. It may not cover all possible information. If you have questions about this medicine, talk to your doctor, pharmacist, or health care provider.  2024 Elsevier/Gold Standard (2021-06-02 00:00:00)  

## 2022-09-22 NOTE — Progress Notes (Signed)
MD reviewed cbc and cmet, VO " ok to treat despite counts"

## 2022-09-24 ENCOUNTER — Other Ambulatory Visit: Payer: Self-pay

## 2022-10-06 ENCOUNTER — Encounter: Payer: Self-pay | Admitting: Hematology & Oncology

## 2022-10-06 ENCOUNTER — Inpatient Hospital Stay: Payer: Medicare Other

## 2022-10-06 ENCOUNTER — Other Ambulatory Visit: Payer: Self-pay

## 2022-10-06 ENCOUNTER — Inpatient Hospital Stay (HOSPITAL_BASED_OUTPATIENT_CLINIC_OR_DEPARTMENT_OTHER): Payer: Medicare Other | Admitting: Hematology & Oncology

## 2022-10-06 DIAGNOSIS — D472 Monoclonal gammopathy: Secondary | ICD-10-CM

## 2022-10-06 DIAGNOSIS — Z5112 Encounter for antineoplastic immunotherapy: Secondary | ICD-10-CM | POA: Diagnosis not present

## 2022-10-06 LAB — CMP (CANCER CENTER ONLY)
ALT: 7 U/L (ref 0–44)
AST: 11 U/L — ABNORMAL LOW (ref 15–41)
Albumin: 4 g/dL (ref 3.5–5.0)
Alkaline Phosphatase: 101 U/L (ref 38–126)
Anion gap: 7 (ref 5–15)
BUN: 19 mg/dL (ref 8–23)
CO2: 29 mmol/L (ref 22–32)
Calcium: 8.7 mg/dL — ABNORMAL LOW (ref 8.9–10.3)
Chloride: 106 mmol/L (ref 98–111)
Creatinine: 1.5 mg/dL — ABNORMAL HIGH (ref 0.61–1.24)
GFR, Estimated: 46 mL/min — ABNORMAL LOW (ref >60)
Glucose, Bld: 156 mg/dL — ABNORMAL HIGH (ref 70–99)
Potassium: 4.4 mmol/L (ref 3.5–5.1)
Sodium: 142 mmol/L (ref 135–145)
Total Bilirubin: 0.4 mg/dL (ref 0.3–1.2)
Total Protein: 5.9 g/dL — ABNORMAL LOW (ref 6.5–8.1)

## 2022-10-06 LAB — CBC WITH DIFFERENTIAL (CANCER CENTER ONLY)
Abs Immature Granulocytes: 0.03 10*3/uL (ref 0.00–0.07)
Basophils Absolute: 0 10*3/uL (ref 0.0–0.1)
Basophils Relative: 0 %
Eosinophils Absolute: 0 10*3/uL (ref 0.0–0.5)
Eosinophils Relative: 1 %
HCT: 39.1 % (ref 39.0–52.0)
Hemoglobin: 12.4 g/dL — ABNORMAL LOW (ref 13.0–17.0)
Immature Granulocytes: 0 %
Lymphocytes Relative: 8 %
Lymphs Abs: 0.7 10*3/uL (ref 0.7–4.0)
MCH: 30 pg (ref 26.0–34.0)
MCHC: 31.7 g/dL (ref 30.0–36.0)
MCV: 94.4 fL (ref 80.0–100.0)
Monocytes Absolute: 0.2 10*3/uL (ref 0.1–1.0)
Monocytes Relative: 2 %
Neutro Abs: 7.6 10*3/uL (ref 1.7–7.7)
Neutrophils Relative %: 89 %
Platelet Count: 174 10*3/uL (ref 150–400)
RBC: 4.14 MIL/uL — ABNORMAL LOW (ref 4.22–5.81)
RDW: 13.9 % (ref 11.5–15.5)
WBC Count: 8.6 10*3/uL (ref 4.0–10.5)
nRBC: 0 % (ref 0.0–0.2)

## 2022-10-06 LAB — LACTATE DEHYDROGENASE: LDH: 111 U/L (ref 98–192)

## 2022-10-06 MED ORDER — BORTEZOMIB CHEMO SQ INJECTION 3.5 MG (2.5MG/ML)
1.3000 mg/m2 | Freq: Once | INTRAMUSCULAR | Status: AC
  Administered 2022-10-06: 2.5 mg via SUBCUTANEOUS
  Filled 2022-10-06: qty 1

## 2022-10-06 MED ORDER — ACETAMINOPHEN 325 MG PO TABS: 650.0000 mg | ORAL_TABLET | Freq: Once | ORAL | Status: DC

## 2022-10-06 MED ORDER — DARATUMUMAB-HYALURONIDASE-FIHJ 1800-30000 MG-UT/15ML ~~LOC~~ SOLN
1800.0000 mg | Freq: Once | SUBCUTANEOUS | Status: AC
  Administered 2022-10-06: 1800 mg via SUBCUTANEOUS
  Filled 2022-10-06: qty 15

## 2022-10-06 MED ORDER — DEXAMETHASONE 4 MG PO TABS: 20.0000 mg | ORAL_TABLET | Freq: Once | ORAL | Status: DC

## 2022-10-06 MED ORDER — DIPHENHYDRAMINE HCL 25 MG PO CAPS: 50.0000 mg | ORAL_CAPSULE | Freq: Once | ORAL | Status: DC

## 2022-10-06 NOTE — Patient Instructions (Signed)
[facility]  Discharge Instructions: Thank you for choosing [facility] to provide your oncology and hematology care.   If you have a lab appointment with the Cancer Center, please go directly to the Cancer Center and check in at the registration area.  Wear comfortable clothing and clothing appropriate for easy access to any Portacath or PICC line.   We strive to give you quality time with your provider. You may need to reschedule your appointment if you arrive late (15 or more minutes).  Arriving late affects you and other patients whose appointments are after yours.  Also, if you miss three or more appointments without notifying the office, you may be dismissed from the clinic at the provider's discretion.      For prescription refill requests, have your pharmacy contact our office and allow 72 hours for refills to be completed.    Today you received the following chemotherapy and/or immunotherapy agents Faspro, Velcade      To help prevent nausea and vomiting after your treatment, we encourage you to take your nausea medication as directed.  BELOW ARE SYMPTOMS THAT SHOULD BE REPORTED IMMEDIATELY: *FEVER GREATER THAN 100.4 F (38 C) OR HIGHER *CHILLS OR SWEATING *NAUSEA AND VOMITING THAT IS NOT CONTROLLED WITH YOUR NAUSEA MEDICATION *UNUSUAL SHORTNESS OF BREATH *UNUSUAL BRUISING OR BLEEDING *URINARY PROBLEMS (pain or burning when urinating, or frequent urination) *BOWEL PROBLEMS (unusual diarrhea, constipation, pain near the anus) TENDERNESS IN MOUTH AND THROAT WITH OR WITHOUT PRESENCE OF ULCERS (sore throat, sores in mouth, or a toothache) UNUSUAL RASH, SWELLING OR PAIN  UNUSUAL VAGINAL DISCHARGE OR ITCHING   Items with * indicate a potential emergency and should be followed up as soon as possible or go to the Emergency Department if any problems should occur.  Please show the CHEMOTHERAPY ALERT CARD or IMMUNOTHERAPY ALERT CARD at  check-in to the Emergency Department and triage nurse. Should you have questions after your visit or need to cancel or reschedule your appointment, please contact [facility] [place]  [phone] and follow the prompts.  Office hours are 8:00 a.m. to 4:30 p.m. Monday - Friday. Please note that voicemails left after 4:00 p.m. may not be returned until the following business day.  We are closed weekends and major holidays. You have access to a nurse at all times for urgent questions. Please call the main number to the clinic [phone] and follow the prompts.  For any non-urgent questions, you may also contact your provider using MyChart. We now offer e-Visits for anyone 67 and older to request care online for non-urgent symptoms. For details visit [email or web address].   Also download the MyChart app! Go to the app store, search "MyChart", open the app, select [facility], and log in with your MyChart username and password.

## 2022-10-06 NOTE — Progress Notes (Signed)
Ok to treat with creatinine of 1.5 per Dr Myna Hidalgo. dph

## 2022-10-06 NOTE — Progress Notes (Signed)
. Hematology and Oncology Follow Up Visit  James Marks [id number] June 30, 1940 82 y.o. 10/06/2022   Principle Diagnosis:  IgA kappa  myeloma  Current Therapy:   Velcade/Revlimid/Decadron -- s/p cycle #6 -- start on 07/27/2021 Faspro/Velcade/Revlimid/Decadron -- s/p cycle #7 - start  on 01/28/2022 --Revlimid  DC'd on 02/24/2022     Interim History:  James Marks is back for follow-up.  So far, he has been doing pretty well.  Unfortunately, he has not been able to play golf this week because of all the rain.  He really enjoys playing golf.  He has had no problems with nausea or vomiting.  He has had no change in bowel or bladder habits.  There is been no cough.  He has had no bleeding.  There has been no rashes.  He has had no leg swelling.  His last myeloma studies that were done back in August showed a monoclonal spike of 0.2 g/dL.  His IgA level was 116 mg/dL.  The Kappa light chain was 2.9 mg/dL.  All this is holding steady.  Currently, I would have to say that his performance status is probably ECOG 1.   Medications:  Current Outpatient Medications:    acetaminophen (TYLENOL) 325 MG tablet, Take 325 mg by mouth. 02/10/2022 Takes 325 mg one hour prior to antibody treatment., Disp: , Rfl:    acyclovir (ZOVIRAX) 400 MG tablet, TAKE 1 TABLET(400 MG) BY MOUTH TWICE DAILY, Disp: 180 tablet, Rfl: 4   amLODipine (NORVASC) 2.5 MG tablet, TAKE 1 TABLET(2.5 MG) BY MOUTH DAILY, Disp: 90 tablet, Rfl: 0   amoxicillin (AMOXIL) 500 MG capsule, Take four tablets (2000 mg) total one hour prior to dental procedure., Disp: 4 capsule, Rfl: 0   BABY ASPIRIN PO, Take 81 mg by mouth daily., Disp: , Rfl:    dexamethasone (DECADRON) 4 MG tablet, TAKE 5 TABLETS BY MOUTH 1 HOUR PRIOR TO CHEMO TREATMENT, Disp: 450 tablet, Rfl: 0   diphenhydrAMINE (BENADRYL) 25 mg capsule, Take 50 mg by mouth. 02/10/2022 Takes one hour prior to antibody treatment., Disp: , Rfl:    gabapentin (NEURONTIN) 300 MG capsule, TAKE 1  CAPSULE BY MOUTH EVERY DAY WITH BREAKFAST, 1 CAPSULE WITH DINNER AND 2 CAPSULES AT BEDTIME, Disp: 360 capsule, Rfl: 4   losartan (COZAAR) 50 MG tablet, TAKE 1 TABLET BY MOUTH DAILY, Disp: 90 tablet, Rfl: 3   montelukast (SINGULAIR) 10 MG tablet, Take 1 tablet (10 mg total) by mouth at bedtime., Disp: 30 tablet, Rfl: 2   Multiple Vitamins-Minerals (PRESERVISION AREDS 2 PO), Take by mouth daily at 6 (six) AM., Disp: , Rfl:    ondansetron (ZOFRAN) 8 MG tablet, Take 1 tablet (8 mg total) by mouth every 8 (eight) hours as needed for nausea or vomiting., Disp: 30 tablet, Rfl: 1   prochlorperazine (COMPAZINE) 10 MG tablet, Take 1 tablet (10 mg total) by mouth every 6 (six) hours as needed for nausea or vomiting., Disp: 30 tablet, Rfl: 1   traMADol (ULTRAM) 50 MG tablet, Take 1 tablet (50 mg total) by mouth every 6 (six) hours as needed. You can take 1-2, if needed, every 6 hours for pain, Disp: 60 tablet, Rfl: 0   traZODone (DESYREL) 50 MG tablet, Take 1 tablet (50 mg total) by mouth at bedtime., Disp: 30 tablet, Rfl: 3 No current facility-administered medications for this visit.  Facility-Administered Medications Ordered in Other Visits:    0.9 %  sodium chloride infusion, , Intravenous, Continuous, Keyshawn Hellwig, Rose Phi, MD, Stopped at  08/05/14 1710  Allergies: No Known Allergies  Past Medical History, Surgical history, Social history, and Family History were reviewed and updated.  Review of Systems: Review of Systems  Constitutional:  Positive for malaise/fatigue.  HENT:  Positive for congestion.   Eyes: Negative.   Respiratory:  Positive for shortness of breath.   Cardiovascular:  Positive for palpitations.  Gastrointestinal: Negative.   Genitourinary: Negative.   Musculoskeletal:  Positive for back pain and myalgias.  Skin: Negative.   Neurological:  Positive for tingling.  Endo/Heme/Allergies: Negative.   Psychiatric/Behavioral: Negative.      Physical Exam:  weight is 173 lb 6.4 oz (78.7  kg). His oral temperature is 98 F (36.7 C). His blood pressure is 146/65 (abnormal) and his pulse is 61. His respiration is 17 and oxygen saturation is 97%.   Wt Readings from Last 3 Encounters:  10/06/22 173 lb 6.4 oz (78.7 kg)  09/08/22 172 lb 1.9 oz (78.1 kg)  08/11/22 170 lb 8 oz (77.3 kg)     Physical Exam Vitals reviewed.  HENT:     Head: Normocephalic and atraumatic.  Eyes:     Pupils: Pupils are equal, round, and reactive to light.  Cardiovascular:     Rate and Rhythm: Normal rate and regular rhythm.     Heart sounds: Normal heart sounds.  Pulmonary:     Effort: Pulmonary effort is normal.     Breath sounds: Normal breath sounds.  Abdominal:     General: Bowel sounds are normal.     Palpations: Abdomen is soft.  Musculoskeletal:        General: No tenderness or deformity. Normal range of motion.     Cervical back: Normal range of motion.  Lymphadenopathy:     Cervical: No cervical adenopathy.  Skin:    General: Skin is warm and dry.     Findings: No erythema or rash.  Neurological:     Mental Status: He is alert and oriented to person, place, and time.  Psychiatric:        Behavior: Behavior normal.        Thought Content: Thought content normal.        Judgment: Judgment normal.      Lab Results  Component Value Date   WBC 8.6 10/06/2022   HGB 12.4 (L) 10/06/2022   HCT 39.1 10/06/2022   MCV 94.4 10/06/2022   PLT 174 10/06/2022     Chemistry      Component Value Date/Time   NA 142 10/06/2022 1150   NA 143 12/06/2016 1156   NA 139 08/02/2016 1146   K 4.4 10/06/2022 1150   K 4.2 12/06/2016 1156   K 4.7 08/02/2016 1146   CL 106 10/06/2022 1150   CL 107 12/06/2016 1156   CO2 29 10/06/2022 1150   CO2 28 12/06/2016 1156   CO2 24 08/02/2016 1146   BUN 19 10/06/2022 1150   BUN 29 (H) 12/06/2016 1156   BUN 34.3 (H) 08/02/2016 1146   CREATININE 1.50 (H) 10/06/2022 1150   CREATININE 2.1 (H) 12/06/2016 1156   CREATININE 1.7 (H) 08/02/2016 1146       Component Value Date/Time   CALCIUM 8.7 (L) 10/06/2022 1150   CALCIUM 9.1 12/06/2016 1156   CALCIUM 9.5 08/02/2016 1146   ALKPHOS 101 10/06/2022 1150   ALKPHOS 108 (H) 12/06/2016 1156   ALKPHOS 115 08/02/2016 1146   AST 11 (L) 10/06/2022 1150   AST 14 08/02/2016 1146   ALT 7 10/06/2022 1150  ALT 18 12/06/2016 1156   ALT 9 08/02/2016 1146   BILITOT 0.4 10/06/2022 1150   BILITOT 0.41 08/02/2016 1146      Impression and Plan:  James Marks is 82 year old man with IgA kappa myeloma.  I things go incredibly well with the Faspro/Velcade.  Hopefully, at some point, we may be able to move the Velcade out to every 3 weeks.  This is all about quality of life.  Again he is done very nicely.  He has responded as I knew he would.  I would like to make sure we do a 24-hour urine on him.  We will go ahead and plan to get him back in 5 weeks.  I will give him an extra week off so that I can see him back myself after I get back from vacation.  James Macho, MD 9/25/202412:29 PM

## 2022-10-08 LAB — KAPPA/LAMBDA LIGHT CHAINS
Kappa free light chain: 28.5 mg/L — ABNORMAL HIGH (ref 3.3–19.4)
Kappa, lambda light chain ratio: 2.61 — ABNORMAL HIGH (ref 0.26–1.65)
Lambda free light chains: 10.9 mg/L (ref 5.7–26.3)

## 2022-10-08 LAB — IGG, IGA, IGM
IgA: 95 mg/dL (ref 61–437)
IgG (Immunoglobin G), Serum: 584 mg/dL — ABNORMAL LOW (ref 603–1613)
IgM (Immunoglobulin M), Srm: 30 mg/dL (ref 15–143)

## 2022-10-11 LAB — PROTEIN ELECTROPHORESIS, SERUM
A/G Ratio: 1.4 (ref 0.7–1.7)
Albumin ELP: 3.6 g/dL (ref 2.9–4.4)
Alpha-1-Globulin: 0.3 g/dL (ref 0.0–0.4)
Alpha-2-Globulin: 0.7 g/dL (ref 0.4–1.0)
Beta Globulin: 0.9 g/dL (ref 0.7–1.3)
Gamma Globulin: 0.6 g/dL (ref 0.4–1.8)
Globulin, Total: 2.5 g/dL (ref 2.2–3.9)
M-Spike, %: 0.2 g/dL — ABNORMAL HIGH
Total Protein ELP: 6.1 g/dL (ref 6.0–8.5)

## 2022-10-20 ENCOUNTER — Inpatient Hospital Stay: Payer: Medicare Other

## 2022-10-20 ENCOUNTER — Inpatient Hospital Stay: Payer: Medicare Other | Attending: Hematology & Oncology

## 2022-10-20 VITALS — BP 156/73 | HR 61 | Temp 98.1°F | Resp 18

## 2022-10-20 DIAGNOSIS — Z5112 Encounter for antineoplastic immunotherapy: Secondary | ICD-10-CM | POA: Diagnosis present

## 2022-10-20 DIAGNOSIS — Z79899 Other long term (current) drug therapy: Secondary | ICD-10-CM | POA: Insufficient documentation

## 2022-10-20 DIAGNOSIS — D472 Monoclonal gammopathy: Secondary | ICD-10-CM

## 2022-10-20 DIAGNOSIS — G629 Polyneuropathy, unspecified: Secondary | ICD-10-CM | POA: Insufficient documentation

## 2022-10-20 DIAGNOSIS — C9000 Multiple myeloma not having achieved remission: Secondary | ICD-10-CM | POA: Diagnosis present

## 2022-10-20 LAB — CBC WITH DIFFERENTIAL (CANCER CENTER ONLY)
Abs Immature Granulocytes: 0.02 10*3/uL (ref 0.00–0.07)
Basophils Absolute: 0 10*3/uL (ref 0.0–0.1)
Basophils Relative: 1 %
Eosinophils Absolute: 0.1 10*3/uL (ref 0.0–0.5)
Eosinophils Relative: 1 %
HCT: 38.6 % — ABNORMAL LOW (ref 39.0–52.0)
Hemoglobin: 12.5 g/dL — ABNORMAL LOW (ref 13.0–17.0)
Immature Granulocytes: 0 %
Lymphocytes Relative: 18 %
Lymphs Abs: 1.1 10*3/uL (ref 0.7–4.0)
MCH: 30 pg (ref 26.0–34.0)
MCHC: 32.4 g/dL (ref 30.0–36.0)
MCV: 92.8 fL (ref 80.0–100.0)
Monocytes Absolute: 0.3 10*3/uL (ref 0.1–1.0)
Monocytes Relative: 5 %
Neutro Abs: 4.8 10*3/uL (ref 1.7–7.7)
Neutrophils Relative %: 75 %
Platelet Count: 153 10*3/uL (ref 150–400)
RBC: 4.16 MIL/uL — ABNORMAL LOW (ref 4.22–5.81)
RDW: 13.4 % (ref 11.5–15.5)
WBC Count: 6.3 10*3/uL (ref 4.0–10.5)
nRBC: 0 % (ref 0.0–0.2)

## 2022-10-20 LAB — CMP (CANCER CENTER ONLY)
ALT: 6 U/L (ref 0–44)
AST: 10 U/L — ABNORMAL LOW (ref 15–41)
Albumin: 3.8 g/dL (ref 3.5–5.0)
Alkaline Phosphatase: 95 U/L (ref 38–126)
Anion gap: 7 (ref 5–15)
BUN: 19 mg/dL (ref 8–23)
CO2: 30 mmol/L (ref 22–32)
Calcium: 8.5 mg/dL — ABNORMAL LOW (ref 8.9–10.3)
Chloride: 107 mmol/L (ref 98–111)
Creatinine: 1.37 mg/dL — ABNORMAL HIGH (ref 0.61–1.24)
GFR, Estimated: 51 mL/min — ABNORMAL LOW (ref >60)
Glucose, Bld: 158 mg/dL — ABNORMAL HIGH (ref 70–99)
Potassium: 4.3 mmol/L (ref 3.5–5.1)
Sodium: 144 mmol/L (ref 135–145)
Total Bilirubin: 0.4 mg/dL (ref 0.3–1.2)
Total Protein: 6.1 g/dL — ABNORMAL LOW (ref 6.5–8.1)

## 2022-10-20 MED ORDER — DEXAMETHASONE 4 MG PO TABS
20.0000 mg | ORAL_TABLET | Freq: Once | ORAL | Status: DC
  Filled 2022-10-20: qty 5

## 2022-10-20 MED ORDER — BORTEZOMIB CHEMO SQ INJECTION 3.5 MG (2.5MG/ML)
1.3000 mg/m2 | Freq: Once | INTRAMUSCULAR | Status: AC
  Administered 2022-10-20: 2.5 mg via SUBCUTANEOUS
  Filled 2022-10-20: qty 1

## 2022-10-20 NOTE — Progress Notes (Signed)
OK to treat with BP-135/92 per order of Dr. Myna Hidalgo.

## 2022-10-20 NOTE — Patient Instructions (Signed)
[facility]  Discharge Instructions: Thank you for choosing [facility] to provide your oncology and hematology care.   If you have a lab appointment with the Cancer Center, please go directly to the Cancer Center and check in at the registration area.  Wear comfortable clothing and clothing appropriate for easy access to any Portacath or PICC line.   We strive to give you quality time with your provider. You may need to reschedule your appointment if you arrive late (15 or more minutes).  Arriving late affects you and other patients whose appointments are after yours.  Also, if you miss three or more appointments without notifying the office, you may be dismissed from the clinic at the provider's discretion.      For prescription refill requests, have your pharmacy contact our office and allow 72 hours for refills to be completed.    Today you received the following chemotherapy and/or immunotherapy agents Velcade.      To help prevent nausea and vomiting after your treatment, we encourage you to take your nausea medication as directed.  BELOW ARE SYMPTOMS THAT SHOULD BE REPORTED IMMEDIATELY: *FEVER GREATER THAN 100.4 F (38 C) OR HIGHER *CHILLS OR SWEATING *NAUSEA AND VOMITING THAT IS NOT CONTROLLED WITH YOUR NAUSEA MEDICATION *UNUSUAL SHORTNESS OF BREATH *UNUSUAL BRUISING OR BLEEDING *URINARY PROBLEMS (pain or burning when urinating, or frequent urination) *BOWEL PROBLEMS (unusual diarrhea, constipation, pain near the anus) TENDERNESS IN MOUTH AND THROAT WITH OR WITHOUT PRESENCE OF ULCERS (sore throat, sores in mouth, or a toothache) UNUSUAL RASH, SWELLING OR PAIN  UNUSUAL VAGINAL DISCHARGE OR ITCHING   Items with * indicate a potential emergency and should be followed up as soon as possible or go to the Emergency Department if any problems should occur.  Please show the CHEMOTHERAPY ALERT CARD or IMMUNOTHERAPY ALERT CARD at check-in  to the Emergency Department and triage nurse. Should you have questions after your visit or need to cancel or reschedule your appointment, please contact [facility]  [phone] and follow the prompts.  Office hours are 8:00 a.m. to 4:30 p.m. Monday - Friday. Please note that voicemails left after 4:00 p.m. may not be returned until the following business day.  We are closed weekends and major holidays. You have access to a nurse at all times for urgent questions. Please call the main number to the clinic [phone] and follow the prompts.  For any non-urgent questions, you may also contact your provider using MyChart. We now offer e-Visits for anyone 18 and older to request care online for non-urgent symptoms. For details visit [email or web address].   Also download the MyChart app! Go to the app store, search "MyChart", open the app, select [facility], and log in with your MyChart username and password.   

## 2022-11-02 ENCOUNTER — Inpatient Hospital Stay: Payer: Medicare Other

## 2022-11-02 ENCOUNTER — Inpatient Hospital Stay: Payer: Medicare Other | Admitting: Medical Oncology

## 2022-11-02 VITALS — BP 161/72 | HR 57 | Temp 97.5°F | Resp 18

## 2022-11-02 DIAGNOSIS — D472 Monoclonal gammopathy: Secondary | ICD-10-CM

## 2022-11-02 DIAGNOSIS — Z5112 Encounter for antineoplastic immunotherapy: Secondary | ICD-10-CM | POA: Diagnosis not present

## 2022-11-02 LAB — CBC WITH DIFFERENTIAL (CANCER CENTER ONLY)
Abs Immature Granulocytes: 0.02 10*3/uL (ref 0.00–0.07)
Basophils Absolute: 0 10*3/uL (ref 0.0–0.1)
Basophils Relative: 0 %
Eosinophils Absolute: 0.1 10*3/uL (ref 0.0–0.5)
Eosinophils Relative: 1 %
HCT: 38.7 % — ABNORMAL LOW (ref 39.0–52.0)
Hemoglobin: 12.6 g/dL — ABNORMAL LOW (ref 13.0–17.0)
Immature Granulocytes: 0 %
Lymphocytes Relative: 22 %
Lymphs Abs: 1.6 10*3/uL (ref 0.7–4.0)
MCH: 30.4 pg (ref 26.0–34.0)
MCHC: 32.6 g/dL (ref 30.0–36.0)
MCV: 93.3 fL (ref 80.0–100.0)
Monocytes Absolute: 0.5 10*3/uL (ref 0.1–1.0)
Monocytes Relative: 8 %
Neutro Abs: 4.9 10*3/uL (ref 1.7–7.7)
Neutrophils Relative %: 69 %
Platelet Count: 172 10*3/uL (ref 150–400)
RBC: 4.15 MIL/uL — ABNORMAL LOW (ref 4.22–5.81)
RDW: 13.2 % (ref 11.5–15.5)
WBC Count: 7.1 10*3/uL (ref 4.0–10.5)
nRBC: 0 % (ref 0.0–0.2)

## 2022-11-02 LAB — CMP (CANCER CENTER ONLY)
ALT: 6 U/L (ref 0–44)
AST: 11 U/L — ABNORMAL LOW (ref 15–41)
Albumin: 4.2 g/dL (ref 3.5–5.0)
Alkaline Phosphatase: 100 U/L (ref 38–126)
Anion gap: 7 (ref 5–15)
BUN: 23 mg/dL (ref 8–23)
CO2: 29 mmol/L (ref 22–32)
Calcium: 8.6 mg/dL — ABNORMAL LOW (ref 8.9–10.3)
Chloride: 108 mmol/L (ref 98–111)
Creatinine: 1.36 mg/dL — ABNORMAL HIGH (ref 0.61–1.24)
GFR, Estimated: 52 mL/min — ABNORMAL LOW (ref >60)
Glucose, Bld: 110 mg/dL — ABNORMAL HIGH (ref 70–99)
Potassium: 3.8 mmol/L (ref 3.5–5.1)
Sodium: 144 mmol/L (ref 135–145)
Total Bilirubin: 0.4 mg/dL (ref 0.3–1.2)
Total Protein: 6 g/dL — ABNORMAL LOW (ref 6.5–8.1)

## 2022-11-02 MED ORDER — DEXAMETHASONE 4 MG PO TABS
20.0000 mg | ORAL_TABLET | Freq: Once | ORAL | Status: DC
Start: 2022-11-02 — End: 2022-11-02

## 2022-11-02 MED ORDER — ACETAMINOPHEN 325 MG PO TABS: 650.0000 mg | ORAL_TABLET | Freq: Once | ORAL | Status: DC

## 2022-11-02 MED ORDER — DARATUMUMAB-HYALURONIDASE-FIHJ 1800-30000 MG-UT/15ML ~~LOC~~ SOLN
1800.0000 mg | Freq: Once | SUBCUTANEOUS | Status: AC
Start: 2022-11-02 — End: 2022-11-02
  Administered 2022-11-02: 1800 mg via SUBCUTANEOUS
  Filled 2022-11-02: qty 15

## 2022-11-02 MED ORDER — BORTEZOMIB CHEMO SQ INJECTION 3.5 MG (2.5MG/ML)
1.3000 mg/m2 | Freq: Once | INTRAMUSCULAR | Status: AC
Start: 2022-11-02 — End: 2022-11-02
  Administered 2022-11-02: 2.5 mg via SUBCUTANEOUS
  Filled 2022-11-02: qty 1

## 2022-11-02 MED ORDER — DIPHENHYDRAMINE HCL 25 MG PO CAPS
50.0000 mg | ORAL_CAPSULE | Freq: Once | ORAL | Status: DC
Start: 2022-11-02 — End: 2022-11-02

## 2022-11-10 ENCOUNTER — Inpatient Hospital Stay: Payer: Medicare Other

## 2022-11-16 ENCOUNTER — Other Ambulatory Visit: Payer: Self-pay

## 2022-11-16 DIAGNOSIS — D472 Monoclonal gammopathy: Secondary | ICD-10-CM

## 2022-11-17 ENCOUNTER — Inpatient Hospital Stay: Payer: Medicare Other

## 2022-11-17 ENCOUNTER — Inpatient Hospital Stay: Payer: Medicare Other | Attending: Hematology & Oncology

## 2022-11-17 VITALS — BP 139/62 | HR 61 | Temp 97.6°F | Resp 17

## 2022-11-17 DIAGNOSIS — Z79899 Other long term (current) drug therapy: Secondary | ICD-10-CM | POA: Insufficient documentation

## 2022-11-17 DIAGNOSIS — C9000 Multiple myeloma not having achieved remission: Secondary | ICD-10-CM | POA: Diagnosis present

## 2022-11-17 DIAGNOSIS — G629 Polyneuropathy, unspecified: Secondary | ICD-10-CM | POA: Diagnosis not present

## 2022-11-17 DIAGNOSIS — Z5112 Encounter for antineoplastic immunotherapy: Secondary | ICD-10-CM | POA: Diagnosis present

## 2022-11-17 DIAGNOSIS — D472 Monoclonal gammopathy: Secondary | ICD-10-CM

## 2022-11-17 LAB — CBC WITH DIFFERENTIAL (CANCER CENTER ONLY)
Abs Immature Granulocytes: 0.05 10*3/uL (ref 0.00–0.07)
Basophils Absolute: 0 10*3/uL (ref 0.0–0.1)
Basophils Relative: 1 %
Eosinophils Absolute: 0.1 10*3/uL (ref 0.0–0.5)
Eosinophils Relative: 1 %
HCT: 39.7 % (ref 39.0–52.0)
Hemoglobin: 12.7 g/dL — ABNORMAL LOW (ref 13.0–17.0)
Immature Granulocytes: 1 %
Lymphocytes Relative: 16 %
Lymphs Abs: 1.4 10*3/uL (ref 0.7–4.0)
MCH: 30 pg (ref 26.0–34.0)
MCHC: 32 g/dL (ref 30.0–36.0)
MCV: 93.6 fL (ref 80.0–100.0)
Monocytes Absolute: 0.5 10*3/uL (ref 0.1–1.0)
Monocytes Relative: 6 %
Neutro Abs: 6.6 10*3/uL (ref 1.7–7.7)
Neutrophils Relative %: 75 %
Platelet Count: 173 10*3/uL (ref 150–400)
RBC: 4.24 MIL/uL (ref 4.22–5.81)
RDW: 13.2 % (ref 11.5–15.5)
WBC Count: 8.7 10*3/uL (ref 4.0–10.5)
nRBC: 0 % (ref 0.0–0.2)

## 2022-11-17 LAB — CMP (CANCER CENTER ONLY)
ALT: 7 U/L (ref 0–44)
AST: 11 U/L — ABNORMAL LOW (ref 15–41)
Albumin: 4.4 g/dL (ref 3.5–5.0)
Alkaline Phosphatase: 95 U/L (ref 38–126)
Anion gap: 6 (ref 5–15)
BUN: 21 mg/dL (ref 8–23)
CO2: 31 mmol/L (ref 22–32)
Calcium: 9.2 mg/dL (ref 8.9–10.3)
Chloride: 108 mmol/L (ref 98–111)
Creatinine: 1.51 mg/dL — ABNORMAL HIGH (ref 0.61–1.24)
GFR, Estimated: 46 mL/min — ABNORMAL LOW (ref >60)
Glucose, Bld: 133 mg/dL — ABNORMAL HIGH (ref 70–99)
Potassium: 4.6 mmol/L (ref 3.5–5.1)
Sodium: 145 mmol/L (ref 135–145)
Total Bilirubin: 0.4 mg/dL (ref <1.2)
Total Protein: 6.4 g/dL — ABNORMAL LOW (ref 6.5–8.1)

## 2022-11-17 MED ORDER — BORTEZOMIB CHEMO SQ INJECTION 3.5 MG (2.5MG/ML)
1.3000 mg/m2 | Freq: Once | INTRAMUSCULAR | Status: AC
  Administered 2022-11-17: 2.5 mg via SUBCUTANEOUS
  Filled 2022-11-17: qty 1

## 2022-11-17 MED ORDER — DEXAMETHASONE 4 MG PO TABS: 20.0000 mg | ORAL_TABLET | Freq: Once | ORAL | Status: DC

## 2022-11-17 NOTE — Patient Instructions (Signed)
[facility]  Discharge Instructions: Thank you for choosing [facility] to provide your oncology and hematology care.   If you have a lab appointment with the Cancer Center, please go directly to the Cancer Center and check in at the registration area.  Wear comfortable clothing and clothing appropriate for easy access to any Portacath or PICC line.   We strive to give you quality time with your provider. You may need to reschedule your appointment if you arrive late (15 or more minutes).  Arriving late affects you and other patients whose appointments are after yours.  Also, if you miss three or more appointments without notifying the office, you may be dismissed from the clinic at the provider's discretion.      For prescription refill requests, have your pharmacy contact our office and allow 72 hours for refills to be completed.    Today you received the following chemotherapy and/or immunotherapy agents Velcade      To help prevent nausea and vomiting after your treatment, we encourage you to take your nausea medication as directed.  BELOW ARE SYMPTOMS THAT SHOULD BE REPORTED IMMEDIATELY: *FEVER GREATER THAN 100.4 F (38 C) OR HIGHER *CHILLS OR SWEATING *NAUSEA AND VOMITING THAT IS NOT CONTROLLED WITH YOUR NAUSEA MEDICATION *UNUSUAL SHORTNESS OF BREATH *UNUSUAL BRUISING OR BLEEDING *URINARY PROBLEMS (pain or burning when urinating, or frequent urination) *BOWEL PROBLEMS (unusual diarrhea, constipation, pain near the anus) TENDERNESS IN MOUTH AND THROAT WITH OR WITHOUT PRESENCE OF ULCERS (sore throat, sores in mouth, or a toothache) UNUSUAL RASH, SWELLING OR PAIN  UNUSUAL VAGINAL DISCHARGE OR ITCHING   Items with * indicate a potential emergency and should be followed up as soon as possible or go to the Emergency Department if any problems should occur.  Please show the CHEMOTHERAPY ALERT CARD or IMMUNOTHERAPY  ALERT CARD at check-in to the Emergency Department and triage nurse. Should you have questions after your visit or need to cancel or reschedule your appointment, please contact [facility] - [facility] OF [facility]  [phone] and follow the prompts.  Office hours are 8:00 a.m. to 4:30 p.m. Monday - Friday. Please note that voicemails left after 4:00 p.m. may not be returned until the following business day.  We are closed weekends and major holidays. You have access to a nurse at all times for urgent questions. Please call the main number to the clinic [phone] and follow the prompts.  For any non-urgent questions, you may also contact your provider using MyChart. We now offer e-Visits for anyone 92 and older to request care online for non-urgent symptoms. For details visit [email or web address].   Also download the MyChart app! Go to the app store, search "MyChart", open the app, select [facility], and log in with your MyChart username and password.

## 2022-11-25 ENCOUNTER — Other Ambulatory Visit: Payer: Self-pay | Admitting: Family Medicine

## 2022-11-29 ENCOUNTER — Encounter: Payer: Self-pay | Admitting: Family Medicine

## 2022-11-29 ENCOUNTER — Ambulatory Visit: Payer: Medicare Other | Admitting: Family Medicine

## 2022-11-29 VITALS — BP 140/60 | HR 65 | Temp 97.7°F | Ht 73.62 in | Wt 171.8 lb

## 2022-11-29 DIAGNOSIS — Z0000 Encounter for general adult medical examination without abnormal findings: Secondary | ICD-10-CM | POA: Diagnosis not present

## 2022-11-29 DIAGNOSIS — Z125 Encounter for screening for malignant neoplasm of prostate: Secondary | ICD-10-CM | POA: Diagnosis not present

## 2022-11-29 DIAGNOSIS — R7303 Prediabetes: Secondary | ICD-10-CM | POA: Diagnosis not present

## 2022-11-29 LAB — LIPID PANEL
Cholesterol: 195 mg/dL (ref 0–200)
HDL: 45.8 mg/dL (ref >39.00)
LDL Cholesterol: 117 mg/dL — ABNORMAL HIGH (ref 0–99)
NonHDL: 149.56
Total CHOL/HDL Ratio: 4
Triglycerides: 165 mg/dL — ABNORMAL HIGH (ref 0.0–149.0)
VLDL: 33 mg/dL (ref 0.0–40.0)

## 2022-11-29 LAB — PSA, MEDICARE: PSA: 3.28 ng/mL (ref 0.10–4.00)

## 2022-11-29 LAB — HEMOGLOBIN A1C: Hgb A1c MFr Bld: 6.3 % (ref 4.6–6.5)

## 2022-11-29 MED ORDER — AMLODIPINE BESYLATE 2.5 MG PO TABS: 2.5000 mg | ORAL_TABLET | Freq: Every day | ORAL | 3 refills | Status: DC

## 2022-11-29 MED ORDER — LOSARTAN POTASSIUM 50 MG PO TABS: 50.0000 mg | ORAL_TABLET | Freq: Every day | ORAL | 3 refills | Status: DC

## 2022-11-29 NOTE — Progress Notes (Signed)
Established Patient Office Visit  Subjective   Patient ID: James Marks, male    DOB: 1940/08/19  Age: 82 y.o. MRN: [id number]  Chief Complaint  Patient presents with   Annual Exam    HPI   James Marks is seen for preventative visit/physical.  His chronic problems include history of hypertension, peripheral neuropathy, chronic kidney disease, history of gout, smoldering multiple myeloma.  Followed closely by oncology.  Had recent CBC and CMP those labs were reviewed.  He and his wife have been dealing with stress of watching over 48-year-old and 88-year-old grandchildren.  They were taken out of custody from their daughter and her boyfriend.  His grandchildren and son-in-law are currently living with them   Does not do any formal exercise.  Gets exhausted easily with yard work.  No recent chest pains.  Denies any recent falls.  Health maintenance reviewed:  Health Maintenance  Topic Date Due   Zoster Vaccines- Shingrix (1 of 2) Never done   DTaP/Tdap/Td (2 - Tdap) 01/12/2015   Medicare Annual Wellness (AWV)  10/19/2016   COVID-19 Vaccine (5 - 2023-24 season) 09/12/2022   Pneumonia Vaccine 76+ Years old  Completed   INFLUENZA VACCINE  Completed   HPV VACCINES  Aged Out   -Flu vaccine already given  Social History   Socioeconomic History   Marital status: Married    Spouse name: Not on file   Number of children: Not on file   Years of education: Not on file   Highest education level: Not on file  Occupational History   Not on file  Tobacco Use   Smoking status: Former    Current packs/day: 0.25    Types: Cigarettes   Smokeless tobacco: Former    Quit date: 10/05/2012  Vaping Use   Vaping status: Never Used  Substance and Sexual Activity   Alcohol use: Never    Comment: Rare, once every three months   Drug use: No   Sexual activity: Not Currently  Other Topics Concern   Not on file  Social History Narrative   Lives with wife in a 2 story home.  Has 1 child.  Retired  from [employer].  Education: 12th grade.   Social Determinants of Health   Financial Resource Strain: Not on file  Food Insecurity: Not on file  Transportation Needs: Not on file  Physical Activity: Not on file  Stress: Not on file  Social Connections: Not on file  Intimate Partner Violence: Not on file   -Ex-smoker -Married.  Currently has custody of 60-year-old and 12-year-old grandchildren.  Family History  Problem Relation Age of Onset   Cancer Mother        breast   Diabetes Mother    Hypertension Father    Diabetes Other        parent   Macular degeneration Brother    Diabetes Mellitus II Brother    Past Medical History:  Diagnosis Date   Cancer (HCC)    myeloma   Diverticulitis    Hearing loss    Hx of colonic polyps    Hyperlipidemia    Hypertension    Kidney stones    Osteopenia    Smoldering multiple myeloma 08/26/2014   Subarachnoid hemorrhage (HCC) 2/01   nontraumatic     Review of Systems  Constitutional:  Positive for malaise/fatigue. Negative for chills, fever and weight loss.  HENT:  Positive for hearing loss.   Eyes:  Negative for blurred vision and double vision.  Respiratory:  Negative for cough and shortness of breath.   Cardiovascular:  Negative for chest pain, palpitations and leg swelling.  Gastrointestinal:  Negative for abdominal pain, blood in stool, constipation and diarrhea.  Genitourinary:  Negative for dysuria.  Skin:  Negative for rash.  Neurological:  Negative for dizziness, speech change, seizures, loss of consciousness and headaches.  Psychiatric/Behavioral:  Negative for depression.       Objective:     BP (!) 140/60 (BP Location: Left Arm, Patient Position: Sitting, Cuff Size: Normal)   Pulse 65   Temp 97.7 F (36.5 C) (Oral)   Ht 6' 1.62" (1.87 m)   Wt 171 lb 12.8 oz (77.9 kg)   SpO2 97%   BMI 22.28 kg/m  BP Readings from Last 3 Encounters:  11/29/22 (!) 140/60  11/17/22 139/62  11/02/22 (!) 161/72   Wt  Readings from Last 3 Encounters:  11/29/22 171 lb 12.8 oz (77.9 kg)  10/06/22 173 lb 6.4 oz (78.7 kg)  09/08/22 172 lb 1.9 oz (78.1 kg)      Physical Exam Vitals reviewed.  Constitutional:      General: He is not in acute distress.    Appearance: He is not ill-appearing or toxic-appearing.  Cardiovascular:     Rate and Rhythm: Normal rate and regular rhythm.  Pulmonary:     Effort: Pulmonary effort is normal.     Breath sounds: Normal breath sounds. No wheezing or rales.  Abdominal:     Palpations: Abdomen is soft.     Tenderness: There is no abdominal tenderness.  Musculoskeletal:     Cervical back: Neck supple.     Right lower leg: No edema.     Left lower leg: No edema.  Lymphadenopathy:     Cervical: No cervical adenopathy.  Neurological:     General: No focal deficit present.     Mental Status: He is alert.      No results found for any visits on 11/29/22.  Last CBC Lab Results  Component Value Date   WBC 8.7 11/17/2022   HGB 12.7 (L) 11/17/2022   HCT 39.7 11/17/2022   MCV 93.6 11/17/2022   MCH 30.0 11/17/2022   RDW 13.2 11/17/2022   PLT 173 11/17/2022   Last metabolic panel Lab Results  Component Value Date   GLUCOSE 133 (H) 11/17/2022   NA 145 11/17/2022   K 4.6 11/17/2022   CL 108 11/17/2022   CO2 31 11/17/2022   BUN 21 11/17/2022   CREATININE 1.51 (H) 11/17/2022   GFRNONAA 46 (L) 11/17/2022   CALCIUM 9.2 11/17/2022   PROT 6.4 (L) 11/17/2022   ALBUMIN 4.4 11/17/2022   LABGLOB 2.5 10/06/2022   AGRATIO 1.4 10/06/2022   BILITOT 0.4 11/17/2022   ALKPHOS 95 11/17/2022   AST 11 (L) 11/17/2022   ALT 7 11/17/2022   ANIONGAP 6 11/17/2022      The ASCVD Risk score (Arnett DK, et al., 2019) failed to calculate for the following reasons:   The 2019 ASCVD risk score is only valid for ages 85 to 79    Assessment & Plan:   Physical exam.  82 year old male with history of multi myeloma, prediabetes, hypertension.  Blood pressure slightly up today  but looks like he has been very well-controlled on multiple recent visits to oncology.  We recommend close monitoring for now and be in touch if consistently greater 140 systolic.  Set up 1 month follow-up.  -Flu vaccine up-to-date -Pneumonia vaccines complete -Check to confirm  he is had previous Shingrix -The natural history of prostate cancer and ongoing controversy regarding screening and potential treatment outcomes of prostate cancer has been discussed with the patient. The meaning of a false positive PSA and a false negative PSA has been discussed. He indicates understanding of the limitations of this screening test and wishes to proceed with screening PSA testing. -Patient would also like to have lipids checked -Check A1c with prior history of prediabetes range blood sugars -Aged out of further colonoscopies   Return in about 1 month (around 12/29/2022).    Evelena Peat, MD

## 2022-12-01 ENCOUNTER — Inpatient Hospital Stay (HOSPITAL_BASED_OUTPATIENT_CLINIC_OR_DEPARTMENT_OTHER): Payer: Medicare Other | Admitting: Hematology & Oncology

## 2022-12-01 ENCOUNTER — Inpatient Hospital Stay: Payer: Medicare Other

## 2022-12-01 ENCOUNTER — Encounter: Payer: Self-pay | Admitting: Hematology & Oncology

## 2022-12-01 VITALS — BP 147/56 | HR 61 | Temp 98.0°F | Resp 19 | Ht 73.0 in | Wt 175.0 lb

## 2022-12-01 DIAGNOSIS — Z5112 Encounter for antineoplastic immunotherapy: Secondary | ICD-10-CM | POA: Diagnosis not present

## 2022-12-01 DIAGNOSIS — D472 Monoclonal gammopathy: Secondary | ICD-10-CM

## 2022-12-01 LAB — CBC WITH DIFFERENTIAL (CANCER CENTER ONLY)
Abs Immature Granulocytes: 0.04 10*3/uL (ref 0.00–0.07)
Basophils Absolute: 0 10*3/uL (ref 0.0–0.1)
Basophils Relative: 0 %
Eosinophils Absolute: 0.1 10*3/uL (ref 0.0–0.5)
Eosinophils Relative: 2 %
HCT: 38.9 % — ABNORMAL LOW (ref 39.0–52.0)
Hemoglobin: 12.6 g/dL — ABNORMAL LOW (ref 13.0–17.0)
Immature Granulocytes: 1 %
Lymphocytes Relative: 20 %
Lymphs Abs: 1.4 10*3/uL (ref 0.7–4.0)
MCH: 30.4 pg (ref 26.0–34.0)
MCHC: 32.4 g/dL (ref 30.0–36.0)
MCV: 94 fL (ref 80.0–100.0)
Monocytes Absolute: 0.4 10*3/uL (ref 0.1–1.0)
Monocytes Relative: 5 %
Neutro Abs: 4.9 10*3/uL (ref 1.7–7.7)
Neutrophils Relative %: 72 %
Platelet Count: 169 10*3/uL (ref 150–400)
RBC: 4.14 MIL/uL — ABNORMAL LOW (ref 4.22–5.81)
RDW: 13.1 % (ref 11.5–15.5)
WBC Count: 6.8 10*3/uL (ref 4.0–10.5)
nRBC: 0 % (ref 0.0–0.2)

## 2022-12-01 LAB — CMP (CANCER CENTER ONLY)
ALT: 8 U/L (ref 0–44)
AST: 12 U/L — ABNORMAL LOW (ref 15–41)
Albumin: 3.9 g/dL (ref 3.5–5.0)
Alkaline Phosphatase: 97 U/L (ref 38–126)
Anion gap: 6 (ref 5–15)
BUN: 22 mg/dL (ref 8–23)
CO2: 31 mmol/L (ref 22–32)
Calcium: 9.1 mg/dL (ref 8.9–10.3)
Chloride: 109 mmol/L (ref 98–111)
Creatinine: 1.37 mg/dL — ABNORMAL HIGH (ref 0.61–1.24)
GFR, Estimated: 51 mL/min — ABNORMAL LOW (ref >60)
Glucose, Bld: 128 mg/dL — ABNORMAL HIGH (ref 70–99)
Potassium: 5 mmol/L (ref 3.5–5.1)
Sodium: 146 mmol/L — ABNORMAL HIGH (ref 135–145)
Total Bilirubin: 0.4 mg/dL (ref <1.2)
Total Protein: 6.1 g/dL — ABNORMAL LOW (ref 6.5–8.1)

## 2022-12-01 LAB — SAVE SMEAR(SSMR), FOR PROVIDER SLIDE REVIEW

## 2022-12-01 MED ORDER — DIPHENHYDRAMINE HCL 25 MG PO CAPS: 50.0000 mg | ORAL_CAPSULE | Freq: Once | ORAL | Status: DC

## 2022-12-01 MED ORDER — ACETAMINOPHEN 325 MG PO TABS: 650.0000 mg | ORAL_TABLET | Freq: Once | ORAL | Status: DC

## 2022-12-01 MED ORDER — DARATUMUMAB-HYALURONIDASE-FIHJ 1800-30000 MG-UT/15ML ~~LOC~~ SOLN
1800.0000 mg | Freq: Once | SUBCUTANEOUS | Status: AC
  Administered 2022-12-01: 1800 mg via SUBCUTANEOUS
  Filled 2022-12-01: qty 15

## 2022-12-01 MED ORDER — DEXAMETHASONE 4 MG PO TABS: 20.0000 mg | ORAL_TABLET | Freq: Once | ORAL | Status: DC

## 2022-12-01 NOTE — Progress Notes (Signed)
No Velcade today per dr Myna Hidalgo.  Only Faspro

## 2022-12-01 NOTE — Patient Instructions (Signed)
 [facility]  Discharge Instructions: Thank you for choosing [facility] to provide your oncology and hematology care.   If you have a lab appointment with the Cancer Center, please go directly to the Cancer Center and check in at the registration area.  Wear comfortable clothing and clothing appropriate for easy access to any Portacath or PICC line.   We strive to give you quality time with your provider. You may need to reschedule your appointment if you arrive late (15 or more minutes).  Arriving late affects you and other patients whose appointments are after yours.  Also, if you miss three or more appointments without notifying the office, you may be dismissed from the clinic at the provider's discretion.      For prescription refill requests, have your pharmacy contact our office and allow 72 hours for refills to be completed.    Today you received the following chemotherapy and/or immunotherapy agents Darzalex      To help prevent nausea and vomiting after your treatment, we encourage you to take your nausea medication as directed.  BELOW ARE SYMPTOMS THAT SHOULD BE REPORTED IMMEDIATELY: *FEVER GREATER THAN 100.4 F (38 C) OR HIGHER *CHILLS OR SWEATING *NAUSEA AND VOMITING THAT IS NOT CONTROLLED WITH YOUR NAUSEA MEDICATION *UNUSUAL SHORTNESS OF BREATH *UNUSUAL BRUISING OR BLEEDING *URINARY PROBLEMS (pain or burning when urinating, or frequent urination) *BOWEL PROBLEMS (unusual diarrhea, constipation, pain near the anus) TENDERNESS IN MOUTH AND THROAT WITH OR WITHOUT PRESENCE OF ULCERS (sore throat, sores in mouth, or a toothache) UNUSUAL RASH, SWELLING OR PAIN  UNUSUAL VAGINAL DISCHARGE OR ITCHING   Items with * indicate a potential emergency and should be followed up as soon as possible or go to the Emergency Department if any problems should occur.  Please show the CHEMOTHERAPY ALERT CARD or IMMUNOTHERAPY  ALERT CARD at check-in to the Emergency Department and triage nurse. Should you have questions after your visit or need to cancel or reschedule your appointment, please contact [facility] - [facility] OF [facility]  [phone] and follow the prompts.  Office hours are 8:00 a.m. to 4:30 p.m. Monday - Friday. Please note that voicemails left after 4:00 p.m. may not be returned until the following business day.  We are closed weekends and major holidays. You have access to a nurse at all times for urgent questions. Please call the main number to the clinic [phone] and follow the prompts.  For any non-urgent questions, you may also contact your provider using MyChart. We now offer e-Visits for anyone 38 and older to request care online for non-urgent symptoms. For details visit [email or web address].   Also download the MyChart app! Go to the app store, search "MyChart", open the app, select [facility], and log in with your MyChart username and password.

## 2022-12-01 NOTE — Progress Notes (Signed)
. Hematology and Oncology Follow Up Visit  James Marks [id number] [birth year] 82 y.o. 12/01/2022   Principle Diagnosis:  IgA kappa  myeloma  Current Therapy:   Velcade/Revlimid/Decadron -- s/p cycle #6 -- start on 07/27/2021 Faspro/Velcade/Revlimid/Decadron -- s/p cycle #7 - start  on 01/28/2022 --Revlimid  DC'd on 02/24/2022 - Velcade d/c on 12/01/2022     Interim History:  Mr. Sikich is back for follow-up.  He still feels tired.  I am still not sure exactly what might be going on.  He has not been able to play golf.  He does not walk as much.  Again he just does not have as much energy.  I suppose that this could still be from the protocol.  We have already discontinued the Revlimid.  We will not discontinue the Velcade.  We will see if this makes a difference.  When we last saw him, the M spike was 0.2 g/dL.  His IgA level is 95 mg/dL.  In the Kappa light chain was 2.9 mg/dL.  Maybe, stop of the Velcade will help him feel little bit better and have more energy.  His appetite is doing okay.  He has had no nausea or vomiting.  He has had no fever.  He has had no bleeding.  There has been no problems with bowels or bladder.  Overall, I was his performance status about ECOG 2.   Medications:  Current Outpatient Medications:    acetaminophen (TYLENOL) 325 MG tablet, Take 325 mg by mouth. 02/10/2022 Takes 325 mg one hour prior to antibody treatment., Disp: , Rfl:    acyclovir (ZOVIRAX) 400 MG tablet, TAKE 1 TABLET(400 MG) BY MOUTH TWICE DAILY, Disp: 180 tablet, Rfl: 4   amLODipine (NORVASC) 2.5 MG tablet, Take 1 tablet (2.5 mg total) by mouth daily., Disp: 90 tablet, Rfl: 3   BABY ASPIRIN PO, Take 81 mg by mouth daily., Disp: , Rfl:    dexamethasone (DECADRON) 4 MG tablet, TAKE 5 TABLETS BY MOUTH 1 HOUR PRIOR TO CHEMO TREATMENT, Disp: 450 tablet, Rfl: 0   diphenhydrAMINE (BENADRYL) 25 mg capsule, Take 50 mg by mouth. 02/10/2022 Takes one hour prior to antibody treatment., Disp: , Rfl:     gabapentin (NEURONTIN) 300 MG capsule, TAKE 1 CAPSULE BY MOUTH EVERY DAY WITH BREAKFAST, 1 CAPSULE WITH DINNER AND 2 CAPSULES AT BEDTIME, Disp: 360 capsule, Rfl: 4   losartan (COZAAR) 50 MG tablet, Take 1 tablet (50 mg total) by mouth daily., Disp: 90 tablet, Rfl: 3   montelukast (SINGULAIR) 10 MG tablet, Take 1 tablet (10 mg total) by mouth at bedtime., Disp: 30 tablet, Rfl: 2   Multiple Vitamins-Minerals (PRESERVISION AREDS 2 PO), Take by mouth daily at 6 (six) AM., Disp: , Rfl:    ondansetron (ZOFRAN) 8 MG tablet, Take 1 tablet (8 mg total) by mouth every 8 (eight) hours as needed for nausea or vomiting., Disp: 30 tablet, Rfl: 1   prochlorperazine (COMPAZINE) 10 MG tablet, Take 1 tablet (10 mg total) by mouth every 6 (six) hours as needed for nausea or vomiting., Disp: 30 tablet, Rfl: 1   traMADol (ULTRAM) 50 MG tablet, Take 1 tablet (50 mg total) by mouth every 6 (six) hours as needed. You can take 1-2, if needed, every 6 hours for pain, Disp: 60 tablet, Rfl: 0   traZODone (DESYREL) 50 MG tablet, Take 1 tablet (50 mg total) by mouth at bedtime., Disp: 30 tablet, Rfl: 3   amoxicillin (AMOXIL) 500 MG capsule, Take four tablets (  2000 mg) total one hour prior to dental procedure. (Patient not taking: Reported on 12/01/2022), Disp: 4 capsule, Rfl: 0 No current facility-administered medications for this visit.  Facility-Administered Medications Ordered in Other Visits:    acetaminophen (TYLENOL) tablet 650 mg, 650 mg, Oral, Once, Hodaya Curto, Rose Phi, MD   daratumumab-hyaluronidase-fihj (DARZALEX FASPRO) 1800-30000 MG-UT/15ML chemo SQ injection 1,800 mg, 1,800 mg, Subcutaneous, Once, Charlesetta Milliron, Rose Phi, MD   dexamethasone (DECADRON) tablet 20 mg, 20 mg, Oral, Once, Swan [place], Rose Phi, MD   diphenhydrAMINE (BENADRYL) capsule 50 mg, 50 mg, Oral, Once, Darcia Lampi, Rose Phi, MD  Allergies: No Known Allergies  Past Medical History, Surgical history, Social history, and Family History were reviewed and  updated.  Review of Systems: Review of Systems  Constitutional:  Positive for malaise/fatigue.  HENT:  Positive for congestion.   Eyes: Negative.   Respiratory:  Positive for shortness of breath.   Cardiovascular:  Positive for palpitations.  Gastrointestinal: Negative.   Genitourinary: Negative.   Musculoskeletal:  Positive for back pain and myalgias.  Skin: Negative.   Neurological:  Positive for tingling.  Endo/Heme/Allergies: Negative.   Psychiatric/Behavioral: Negative.      Physical Exam:  height is 6\' 1"  (1.854 m) and weight is 175 lb (79.4 kg). His oral temperature is 98 F (36.7 C). His blood pressure is 147/56 (abnormal) and his pulse is 61. His respiration is 19 and oxygen saturation is 98%.   Wt Readings from Last 3 Encounters:  12/01/22 175 lb (79.4 kg)  11/29/22 171 lb 12.8 oz (77.9 kg)  10/06/22 173 lb 6.4 oz (78.7 kg)     Physical Exam Vitals reviewed.  HENT:     Head: Normocephalic and atraumatic.  Eyes:     Pupils: Pupils are equal, round, and reactive to light.  Cardiovascular:     Rate and Rhythm: Normal rate and regular rhythm.     Heart sounds: Normal heart sounds.  Pulmonary:     Effort: Pulmonary effort is normal.     Breath sounds: Normal breath sounds.  Abdominal:     General: Bowel sounds are normal.     Palpations: Abdomen is soft.  Musculoskeletal:        General: No tenderness or deformity. Normal range of motion.     Cervical back: Normal range of motion.  Lymphadenopathy:     Cervical: No cervical adenopathy.  Skin:    General: Skin is warm and dry.     Findings: No erythema or rash.  Neurological:     Mental Status: He is alert and oriented to person, place, and time.  Psychiatric:        Behavior: Behavior normal.        Thought Content: Thought content normal.        Judgment: Judgment normal.     Lab Results  Component Value Date   WBC 6.8 12/01/2022   HGB 12.6 (L) 12/01/2022   HCT 38.9 (L) 12/01/2022   MCV 94.0  12/01/2022   PLT 169 12/01/2022     Chemistry      Component Value Date/Time   NA 146 (H) 12/01/2022 1032   NA 143 12/06/2016 1156   NA 139 08/02/2016 1146   K 5.0 12/01/2022 1032   K 4.2 12/06/2016 1156   K 4.7 08/02/2016 1146   CL 109 12/01/2022 1032   CL 107 12/06/2016 1156   CO2 31 12/01/2022 1032   CO2 28 12/06/2016 1156   CO2 24 08/02/2016 1146   BUN 22  12/01/2022 1032   BUN 29 (H) 12/06/2016 1156   BUN 34.3 (H) 08/02/2016 1146   CREATININE 1.37 (H) 12/01/2022 1032   CREATININE 2.1 (H) 12/06/2016 1156   CREATININE 1.7 (H) 08/02/2016 1146      Component Value Date/Time   CALCIUM 9.1 12/01/2022 1032   CALCIUM 9.1 12/06/2016 1156   CALCIUM 9.5 08/02/2016 1146   ALKPHOS 97 12/01/2022 1032   ALKPHOS 108 (H) 12/06/2016 1156   ALKPHOS 115 08/02/2016 1146   AST 12 (L) 12/01/2022 1032   AST 14 08/02/2016 1146   ALT 8 12/01/2022 1032   ALT 18 12/06/2016 1156   ALT 9 08/02/2016 1146   BILITOT 0.4 12/01/2022 1032   BILITOT 0.41 08/02/2016 1146      Impression and Plan:  Mr. Hoefling is 82 year old man with IgA kappa myeloma.  We are gradually taking out some of the treatments and the protocol.  At this point in time, he will only be on Faspro.  Again, his quality of life is what is important.  We will see how he feels on the Faspro.    We will plan to get back in 1 month.   [name], MD 11/20/202411:41 AM

## 2022-12-02 ENCOUNTER — Other Ambulatory Visit: Payer: Self-pay

## 2022-12-02 LAB — IGG, IGA, IGM
IgA: 100 mg/dL (ref 61–437)
IgG (Immunoglobin G), Serum: 533 mg/dL — ABNORMAL LOW (ref 603–1613)
IgM (Immunoglobulin M), Srm: 28 mg/dL (ref 15–143)

## 2022-12-02 LAB — KAPPA/LAMBDA LIGHT CHAINS
Kappa free light chain: 27.7 mg/L — ABNORMAL HIGH (ref 3.3–19.4)
Kappa, lambda light chain ratio: 2.54 — ABNORMAL HIGH (ref 0.26–1.65)
Lambda free light chains: 10.9 mg/L (ref 5.7–26.3)

## 2022-12-15 ENCOUNTER — Inpatient Hospital Stay: Payer: Medicare Other

## 2022-12-17 ENCOUNTER — Other Ambulatory Visit: Payer: Self-pay | Admitting: Family Medicine

## 2022-12-22 ENCOUNTER — Inpatient Hospital Stay: Payer: Medicare Other | Attending: Hematology & Oncology

## 2022-12-22 DIAGNOSIS — Z5112 Encounter for antineoplastic immunotherapy: Secondary | ICD-10-CM | POA: Insufficient documentation

## 2022-12-22 DIAGNOSIS — G629 Polyneuropathy, unspecified: Secondary | ICD-10-CM | POA: Diagnosis not present

## 2022-12-22 DIAGNOSIS — Z79899 Other long term (current) drug therapy: Secondary | ICD-10-CM | POA: Insufficient documentation

## 2022-12-22 DIAGNOSIS — C9000 Multiple myeloma not having achieved remission: Secondary | ICD-10-CM | POA: Diagnosis present

## 2022-12-22 DIAGNOSIS — D472 Monoclonal gammopathy: Secondary | ICD-10-CM

## 2022-12-24 ENCOUNTER — Telehealth: Payer: Self-pay

## 2022-12-24 LAB — UPEP/UIFE/LIGHT CHAINS/TP, 24-HR UR
% BETA, Urine: 0 %
ALPHA 1 URINE: 0 %
Albumin, U: 100 %
Alpha 2, Urine: 0 %
Free Kappa Lt Chains,Ur: 20.97 mg/L (ref 1.17–86.46)
Free Kappa/Lambda Ratio: 9.62 (ref 1.83–14.26)
Free Lambda Lt Chains,Ur: 2.18 mg/L (ref 0.27–15.21)
GAMMA GLOBULIN URINE: 0 %
Total Protein, Urine-Ur/day: 183 mg/(24.h) — ABNORMAL HIGH (ref 30–150)
Total Protein, Urine: 6.9 mg/dL
Total Volume: 2650

## 2022-12-24 NOTE — Telephone Encounter (Signed)
Advised via MyChart.

## 2022-12-24 NOTE — Telephone Encounter (Signed)
-----   Message from [name] sent at 12/24/2022  3:53 PM EST ----- Please call and let him know that the 24-hour urine does not show any abnormal protein.  Cindee Lame

## 2022-12-29 ENCOUNTER — Encounter: Payer: Self-pay | Admitting: Family

## 2022-12-29 ENCOUNTER — Inpatient Hospital Stay: Payer: Medicare Other

## 2022-12-29 ENCOUNTER — Inpatient Hospital Stay (HOSPITAL_BASED_OUTPATIENT_CLINIC_OR_DEPARTMENT_OTHER): Payer: Medicare Other | Admitting: Family

## 2022-12-29 ENCOUNTER — Encounter: Payer: Self-pay | Admitting: Hematology & Oncology

## 2022-12-29 VITALS — BP 140/71 | HR 62 | Temp 98.1°F | Resp 19 | Ht 73.0 in | Wt 173.0 lb

## 2022-12-29 DIAGNOSIS — D472 Monoclonal gammopathy: Secondary | ICD-10-CM

## 2022-12-29 DIAGNOSIS — Z5112 Encounter for antineoplastic immunotherapy: Secondary | ICD-10-CM | POA: Diagnosis not present

## 2022-12-29 DIAGNOSIS — D509 Iron deficiency anemia, unspecified: Secondary | ICD-10-CM

## 2022-12-29 LAB — CMP (CANCER CENTER ONLY)
ALT: 7 U/L (ref 0–44)
AST: 11 U/L — ABNORMAL LOW (ref 15–41)
Albumin: 4.2 g/dL (ref 3.5–5.0)
Alkaline Phosphatase: 98 U/L (ref 38–126)
Anion gap: 8 (ref 5–15)
BUN: 19 mg/dL (ref 8–23)
CO2: 30 mmol/L (ref 22–32)
Calcium: 9.1 mg/dL (ref 8.9–10.3)
Chloride: 107 mmol/L (ref 98–111)
Creatinine: 1.28 mg/dL — ABNORMAL HIGH (ref 0.61–1.24)
GFR, Estimated: 56 mL/min — ABNORMAL LOW (ref >60)
Glucose, Bld: 130 mg/dL — ABNORMAL HIGH (ref 70–99)
Potassium: 4.3 mmol/L (ref 3.5–5.1)
Sodium: 145 mmol/L (ref 135–145)
Total Bilirubin: 0.4 mg/dL (ref <1.2)
Total Protein: 6.3 g/dL — ABNORMAL LOW (ref 6.5–8.1)

## 2022-12-29 LAB — CBC WITH DIFFERENTIAL (CANCER CENTER ONLY)
Abs Immature Granulocytes: 0.03 10*3/uL (ref 0.00–0.07)
Basophils Absolute: 0 10*3/uL (ref 0.0–0.1)
Basophils Relative: 0 %
Eosinophils Absolute: 0.1 10*3/uL (ref 0.0–0.5)
Eosinophils Relative: 2 %
HCT: 40.2 % (ref 39.0–52.0)
Hemoglobin: 12.9 g/dL — ABNORMAL LOW (ref 13.0–17.0)
Immature Granulocytes: 0 %
Lymphocytes Relative: 20 %
Lymphs Abs: 1.4 10*3/uL (ref 0.7–4.0)
MCH: 29.5 pg (ref 26.0–34.0)
MCHC: 32.1 g/dL (ref 30.0–36.0)
MCV: 92 fL (ref 80.0–100.0)
Monocytes Absolute: 0.4 10*3/uL (ref 0.1–1.0)
Monocytes Relative: 6 %
Neutro Abs: 5.1 10*3/uL (ref 1.7–7.7)
Neutrophils Relative %: 72 %
Platelet Count: 181 10*3/uL (ref 150–400)
RBC: 4.37 MIL/uL (ref 4.22–5.81)
RDW: 12.7 % (ref 11.5–15.5)
WBC Count: 7.2 10*3/uL (ref 4.0–10.5)
nRBC: 0 % (ref 0.0–0.2)

## 2022-12-29 LAB — LACTATE DEHYDROGENASE: LDH: 121 U/L (ref 98–192)

## 2022-12-29 MED ORDER — DIPHENHYDRAMINE HCL 25 MG PO CAPS
50.0000 mg | ORAL_CAPSULE | Freq: Once | ORAL | Status: DC
Start: 2022-12-29 — End: 2022-12-29

## 2022-12-29 MED ORDER — DARATUMUMAB-HYALURONIDASE-FIHJ 1800-30000 MG-UT/15ML ~~LOC~~ SOLN
1800.0000 mg | Freq: Once | SUBCUTANEOUS | Status: AC
Start: 2022-12-29 — End: 2022-12-29
  Administered 2022-12-29: 1800 mg via SUBCUTANEOUS
  Filled 2022-12-29: qty 15

## 2022-12-29 MED ORDER — ACETAMINOPHEN 325 MG PO TABS: 650.0000 mg | ORAL_TABLET | Freq: Once | ORAL | Status: DC

## 2022-12-29 MED ORDER — DEXAMETHASONE 4 MG PO TABS
20.0000 mg | ORAL_TABLET | Freq: Once | ORAL | Status: DC
Start: 2022-12-29 — End: 2022-12-29

## 2022-12-29 NOTE — Progress Notes (Signed)
Hematology and Oncology Follow Up Visit  James Marks [id number] 11/13/1940 82 y.o. 12/29/2022   Principle Diagnosis:  IgA kappa  myeloma  Past Therapy: Velcade/Revlimid/Decadron -- s/p cycle #6 -- start on 07/27/2021   Current Therapy:        Faspro/Velcade/Revlimid/Decadron -- s/p cycle #7 - start  on 01/28/2022 --Revlimid  DC'd on 02/24/2022 - Velcade d/c on 12/01/2022   Interim History:  James Marks is here today for follow-up and Faspro. He is doing well and notes that his energy seems a little improved since being off Velcade.  24 hr urine last week did not show any protein. Labs are pending.  In November, IgA level was 100 mg/dL and kappa light chains 27.7 mg/L No fever, chills, n/v, cough, rash, dizziness, SOB, chest pain, palpitations, abdominal pain or changes in bowel or bladder habits.  Neuropathy in the lower extremities unchanged from baseline. He states that he has to be careful when ambulating and thankfully has not had any falls or syncope.  Appetite and hydration are good. Weight is stable at 173 lbs.  No blood loss, bruising or petechiae.   ECOG Performance Status: 1 - Symptomatic but completely ambulatory  Medications:  Allergies as of 12/29/2022   No Known Allergies      Medication List        Accurate as of December 29, 2022  9:57 AM. If you have any questions, ask your nurse or doctor.          acetaminophen 325 MG tablet Commonly known as: TYLENOL Take 325 mg by mouth. 02/10/2022 Takes 325 mg one hour prior to antibody treatment.   acyclovir 400 MG tablet Commonly known as: ZOVIRAX TAKE 1 TABLET(400 MG) BY MOUTH TWICE DAILY   amLODipine 2.5 MG tablet Commonly known as: NORVASC Take 1 tablet (2.5 mg total) by mouth daily.   amoxicillin 500 MG capsule Commonly known as: AMOXIL Take four tablets (2000 mg) total one hour prior to dental procedure.   BABY ASPIRIN PO Take 81 mg by mouth daily.   dexamethasone 4 MG tablet Commonly  known as: DECADRON TAKE 5 TABLETS BY MOUTH 1 HOUR PRIOR TO CHEMO TREATMENT   diphenhydrAMINE 25 mg capsule Commonly known as: BENADRYL Take 50 mg by mouth. 02/10/2022 Takes one hour prior to antibody treatment.   gabapentin 300 MG capsule Commonly known as: NEURONTIN TAKE 1 CAPSULE BY MOUTH EVERY DAY WITH BREAKFAST, 1 CAPSULE WITH DINNER AND 2 CAPSULES AT BEDTIME   losartan 50 MG tablet Commonly known as: COZAAR TAKE 1 TABLET BY MOUTH DAILY   montelukast 10 MG tablet Commonly known as: Singulair Take 1 tablet (10 mg total) by mouth at bedtime.   ondansetron 8 MG tablet Commonly known as: Zofran Take 1 tablet (8 mg total) by mouth every 8 (eight) hours as needed for nausea or vomiting.   PRESERVISION AREDS 2 PO Take by mouth daily at 6 (six) AM.   prochlorperazine 10 MG tablet Commonly known as: COMPAZINE Take 1 tablet (10 mg total) by mouth every 6 (six) hours as needed for nausea or vomiting.   traMADol 50 MG tablet Commonly known as: ULTRAM Take 1 tablet (50 mg total) by mouth every 6 (six) hours as needed. You can take 1-2, if needed, every 6 hours for pain   traZODone 50 MG tablet Commonly known as: DESYREL Take 1 tablet (50 mg total) by mouth at bedtime.        Allergies: No Known Allergies  Past Medical History,  Surgical history, Social history, and Family History were reviewed and updated.  Review of Systems: All other 10 point review of systems is negative.   Physical Exam:  vitals were not taken for this visit.   Wt Readings from Last 3 Encounters:  12/01/22 175 lb (79.4 kg)  11/29/22 171 lb 12.8 oz (77.9 kg)  10/06/22 173 lb 6.4 oz (78.7 kg)    Ocular: Sclerae unicteric, pupils equal, round and reactive to light Ear-nose-throat: Oropharynx clear, dentition fair Lymphatic: No cervical or supraclavicular adenopathy Lungs no rales or rhonchi, good excursion bilaterally Heart regular rate and rhythm, no murmur appreciated Abd soft, nontender,  positive bowel sounds MSK no focal spinal tenderness, no joint edema Neuro: non-focal, well-oriented, appropriate affect Breasts: Deferred   Lab Results  Component Value Date   WBC 7.2 12/29/2022   HGB 12.9 (L) 12/29/2022   HCT 40.2 12/29/2022   MCV 92.0 12/29/2022   PLT 181 12/29/2022   Lab Results  Component Value Date   FERRITIN 79 12/16/2021   IRON 57 03/25/2022   TIBC 309 03/25/2022   UIBC 252 03/25/2022   IRONPCTSAT 18 03/25/2022   Lab Results  Component Value Date   RETICCTPCT 1.0 12/02/2021   RBC 4.37 12/29/2022   Lab Results  Component Value Date   KPAFRELGTCHN 27.7 (H) 12/01/2022   LAMBDASER 10.9 12/01/2022   KAPLAMBRATIO 9.62 12/22/2022   Lab Results  Component Value Date   IGGSERUM 533 (L) 12/01/2022   IGA 100 12/01/2022   IGMSERUM 28 12/01/2022   Lab Results  Component Value Date   TOTALPROTELP 6.1 10/06/2022   ALBUMINELP 3.6 10/06/2022   A1GS 0.3 10/06/2022   A2GS 0.7 10/06/2022   BETS 0.9 10/06/2022   BETA2SER 0.9 (H) 12/27/2014   GAMS 0.6 10/06/2022   MSPIKE 0.2 (H) 10/06/2022   SPEI Comment 10/06/2022     Chemistry      Component Value Date/Time   NA 146 (H) 12/01/2022 1032   NA 143 12/06/2016 1156   NA 139 08/02/2016 1146   K 5.0 12/01/2022 1032   K 4.2 12/06/2016 1156   K 4.7 08/02/2016 1146   CL 109 12/01/2022 1032   CL 107 12/06/2016 1156   CO2 31 12/01/2022 1032   CO2 28 12/06/2016 1156   CO2 24 08/02/2016 1146   BUN 22 12/01/2022 1032   BUN 29 (H) 12/06/2016 1156   BUN 34.3 (H) 08/02/2016 1146   CREATININE 1.37 (H) 12/01/2022 1032   CREATININE 2.1 (H) 12/06/2016 1156   CREATININE 1.7 (H) 08/02/2016 1146      Component Value Date/Time   CALCIUM 9.1 12/01/2022 1032   CALCIUM 9.1 12/06/2016 1156   CALCIUM 9.5 08/02/2016 1146   ALKPHOS 97 12/01/2022 1032   ALKPHOS 108 (H) 12/06/2016 1156   ALKPHOS 115 08/02/2016 1146   AST 12 (L) 12/01/2022 1032   AST 14 08/02/2016 1146   ALT 8 12/01/2022 1032   ALT 18 12/06/2016 1156    ALT 9 08/02/2016 1146   BILITOT 0.4 12/01/2022 1032   BILITOT 0.41 08/02/2016 1146       Impression and Plan: James Marks is 82 year old man with IgA kappa myeloma. He is doing well off Velcade. Protein studies ar pending.  We will proceed with Faspro today as planned per MD.  Follow-up in 4 weeks.   Eileen Stanford, NP 12/18/20249:57 AM

## 2022-12-29 NOTE — Addendum Note (Signed)
Addended by: Arlan Organ R on: 12/29/2022 11:26 AM   Modules accepted: Orders

## 2022-12-29 NOTE — Patient Instructions (Signed)
[facility] - A DEPT OF [facility]  Discharge Instructions: Thank you for choosing [facility] to provide your oncology and hematology care.   If you have a lab appointment with the Cancer Center, please go directly to the Cancer Center and check in at the registration area.  Wear comfortable clothing and clothing appropriate for easy access to any Portacath or PICC line.   We strive to give you quality time with your provider. You may need to reschedule your appointment if you arrive late (15 or more minutes).  Arriving late affects you and other patients whose appointments are after yours.  Also, if you miss three or more appointments without notifying the office, you may be dismissed from the clinic at the provider's discretion.      For prescription refill requests, have your pharmacy contact our office and allow 72 hours for refills to be completed.    Today you received the following chemotherapy and/or immunotherapy agents Daratumumab       To help prevent nausea and vomiting after your treatment, we encourage you to take your nausea medication as directed.  BELOW ARE SYMPTOMS THAT SHOULD BE REPORTED IMMEDIATELY: *FEVER GREATER THAN 100.4 F (38 C) OR HIGHER *CHILLS OR SWEATING *NAUSEA AND VOMITING THAT IS NOT CONTROLLED WITH YOUR NAUSEA MEDICATION *UNUSUAL SHORTNESS OF BREATH *UNUSUAL BRUISING OR BLEEDING *URINARY PROBLEMS (pain or burning when urinating, or frequent urination) *BOWEL PROBLEMS (unusual diarrhea, constipation, pain near the anus) TENDERNESS IN MOUTH AND THROAT WITH OR WITHOUT PRESENCE OF ULCERS (sore throat, sores in mouth, or a toothache) UNUSUAL RASH, SWELLING OR PAIN  UNUSUAL VAGINAL DISCHARGE OR ITCHING   Items with * indicate a potential emergency and should be followed up as soon as possible or go to the Emergency Department if any problems should occur.  Please show the CHEMOTHERAPY ALERT CARD or  IMMUNOTHERAPY ALERT CARD at check-in to the Emergency Department and triage nurse. Should you have questions after your visit or need to cancel or reschedule your appointment, please contact [facility] - A DEPT OF [facility]  [phone] and follow the prompts.  Office hours are 8:00 a.m. to 4:30 p.m. Monday - Friday. Please note that voicemails left after 4:00 p.m. may not be returned until the following business day.  We are closed weekends and major holidays. You have access to a nurse at all times for urgent questions. Please call the main number to the clinic [phone] and follow the prompts.  For any non-urgent questions, you may also contact your provider using MyChart. We now offer e-Visits for anyone 1 and older to request care online for non-urgent symptoms. For details visit [email or web address].   Also download the MyChart app! Go to the app store, search "MyChart", open the app, select [facility], and log in with your MyChart username and password.

## 2022-12-30 ENCOUNTER — Other Ambulatory Visit: Payer: Self-pay

## 2022-12-30 LAB — IGG, IGA, IGM
IgA: 108 mg/dL (ref 61–437)
IgG (Immunoglobin G), Serum: 602 mg/dL — ABNORMAL LOW (ref 603–1613)
IgM (Immunoglobulin M), Srm: 32 mg/dL (ref 15–143)

## 2022-12-30 LAB — KAPPA/LAMBDA LIGHT CHAINS
Kappa free light chain: 27 mg/L — ABNORMAL HIGH (ref 3.3–19.4)
Kappa, lambda light chain ratio: 2.45 — ABNORMAL HIGH (ref 0.26–1.65)
Lambda free light chains: 11 mg/L (ref 5.7–26.3)

## 2023-01-02 LAB — PROTEIN ELECTROPHORESIS, SERUM, WITH REFLEX
A/G Ratio: 1.6 (ref 0.7–1.7)
Albumin ELP: 3.7 g/dL (ref 2.9–4.4)
Alpha-1-Globulin: 0.2 g/dL (ref 0.0–0.4)
Alpha-2-Globulin: 0.7 g/dL (ref 0.4–1.0)
Beta Globulin: 0.8 g/dL (ref 0.7–1.3)
Gamma Globulin: 0.6 g/dL (ref 0.4–1.8)
Globulin, Total: 2.3 g/dL (ref 2.2–3.9)
Total Protein ELP: 6 g/dL (ref 6.0–8.5)

## 2023-01-03 ENCOUNTER — Other Ambulatory Visit: Payer: Self-pay

## 2023-01-07 ENCOUNTER — Ambulatory Visit: Payer: Self-pay | Admitting: Family Medicine

## 2023-01-07 NOTE — Telephone Encounter (Signed)
Brought attention to one of the providers, Shirline Frees, NP. Provider advise pt to head to ER.   Called wife. Inform her of provider's advise. She states she "will try her best to get him to go to ER".

## 2023-01-07 NOTE — Telephone Encounter (Signed)
Spoke to [place], pt's wife.  Wife reports pt took 1 tab of Losartan 50mg  at 9:30am. Check pt at 1045am 3x. 1:196/86;2:177/87;3:187/89. Pt will take Amlodipine 2.5mg  at night. Wife denied of pt is having any chest pain or change in vision. Wife reports she noticed slurred speech 2 days ago. And she states pt had said " there is nothing to it". Advise wife to take pt to ER due to slurred speech and elevated BP. Wife declined and states she will continue to monitor pt BP, if not going down or notice slurred in speech. She will take him to ER.  She wants to know if BP can be increase. Scheduled  a follow up with PCP on 01/11/2023.

## 2023-01-07 NOTE — Telephone Encounter (Signed)
Copied from CRM [id number]. Topic: Clinical - Medication Question >> Jan 07, 2023  9:53 AM Samuel Jester B wrote: Reason for CRM: Pt wife Tyler Aas is requesting a call back regarding the pt blood pressure being 184/84 then the second reading was 179/89, and would like to see if the pcp can up the dosage on the pt amlodipine. She stated that provider lowered the dosage not to long ago.    Chief Complaint: elevated BP  Disposition: [] ED /[] Urgent Care (no appt availability in office) / [] Appointment(In office/virtual)/ []  [facility]/ [] Home Care/ [] Refused Recommended Disposition /[] [facility]/ [x]  Follow-up with PCP Additional Notes: spoke with wife. Pt having elevated BP readings this morning, 189/84 then 179/89. She sts that his Amlodipine was decreased to 2.5mg  by his oncologist back in November and now his BP is running high. Pt also having mild headaches per wife. Pt  took his morning dose of Losartan before calling but Wife asking if pt should take an extra dose of the Amlodopine. RN to follow-up call patient to see if his BP is downtrending.     Reason for Disposition  [1] Caller has URGENT medicine question about med that PCP or specialist prescribed AND [2] triager unable to answer question  Answer Assessment - Initial Assessment Questions 1. NAME of MEDICINE: "What medicine(s) are you calling about?"     Amlodipine 2.5mg   2. QUESTION: "What is your question?" (e.g., double dose of medicine, side effect)     Wants to know if patient should take an extra dose  3. PRESCRIBER: "Who prescribed the medicine?" Reason: if prescribed by specialist, call should be referred to that group.     Dr. Caryl Never  4. SYMPTOMS: "Do you have any symptoms?" If Yes, ask: "What symptoms are you having?"  "How bad are the symptoms (e.g., mild, moderate, severe)     Mild headache  Protocols used: Medication Question Call-A-AH

## 2023-01-10 ENCOUNTER — Ambulatory Visit: Payer: Self-pay | Admitting: Family Medicine

## 2023-01-10 NOTE — Telephone Encounter (Signed)
   Chief Complaint: elevated BP readings  Symptoms: headaches, changes in speech intermittently, off balance  Frequency: x 1 week  Pertinent Negatives: Patient denies chest pain, SOB/difficulty breathing, fever, changes in vision, pain, use of blood thinners  Disposition: [x] ED /[] Urgent Care (no appt availability in office) / [] Appointment(In office/virtual)/ []  [facility]/ [] Home Care/ [x] Refused Recommended Disposition /[] [facility]/ []  Follow-up with PCP Additional Notes: Spoke with patient's wife, Tyler Aas. Wife requesting if pt can be seen by PCP today. Advised ED disposition. Wife agreeable but states pt refuses ED. Educated wife on ED reasoning and emergency of symptoms. Wife states she will try to talk him into the freestanding ED.   Copied from CRM [id number]. Topic: Clinical - Red Word Triage >> Jan 10, 2023  9:18 AM Adele Barthel wrote: Red Word that prompted transfer to Nurse Triage: Elevated blood pressure readings over the weekend. Highest reading of 196/84, with readings of 186/83 last night and 176/83. Intermittent issues with speaking. History of cancer with chemo drug that can cause hypertension Reason for Disposition  Headache  (and neurologic deficit)  Answer Assessment - Initial Assessment Questions 1. SYMPTOM: "What is the main symptom you are concerned about?" (e.g., weakness, numbness)     Pt's wife reports pt having changes in speech "mouth wasn't moving right".   2. ONSET: "When did this start?" (minutes, hours, days; while sleeping)     "Several times over a about a week"  3. LAST NORMAL: "When was the last time you (the patient) were normal (no symptoms)?"     Pt's wife states pt currently is not having the symptom but last time was 3-4 days ago.  4. PATTERN "Does this come and go, or has it been constant since it started?"  "Is it present now?"     Comes and goes, wife states he is not having it now.  5. CARDIAC SYMPTOMS: "Have you had any  of the following symptoms: chest pain, difficulty breathing, palpitations?"     Denies.  6. NEUROLOGIC SYMPTOMS: "Have you had any of the following symptoms: headache, dizziness, vision loss, double vision, changes in speech, unsteady on your feet?"     Headaches, worsening balance (fell about a week and half ago).  7. OTHER SYMPTOMS: "Do you have any other symptoms?"     Elevated BP readings, wife states he has been under a lot of stress as they have been helping take care of their grandchildren.  Protocols used: Neurologic Deficit-A-AH

## 2023-01-10 NOTE — Telephone Encounter (Signed)
Noted  

## 2023-01-10 NOTE — Telephone Encounter (Signed)
I spoke with the patient's wife and advised urgent evaluation at ED for the symptoms mentioned in prior note. Patient's wife reported she has been trying to convince the patient to go to ED but he states he only wants to see PCP. Patient's wife reported BP reading of 176/83 today, 186/83 yesterday evening with the lowest being 160/80 yesterday. Patient's wife reported she will try to convince the patient to go to ED. Patient does have appt tomorrow with PCP

## 2023-01-11 ENCOUNTER — Encounter: Payer: Self-pay | Admitting: Family Medicine

## 2023-01-11 ENCOUNTER — Ambulatory Visit (INDEPENDENT_AMBULATORY_CARE_PROVIDER_SITE_OTHER): Payer: Medicare Other | Admitting: Family Medicine

## 2023-01-11 VITALS — BP 164/90 | HR 65 | Temp 97.4°F | Ht 73.0 in | Wt 169.0 lb

## 2023-01-11 DIAGNOSIS — N1832 Chronic kidney disease, stage 3b: Secondary | ICD-10-CM

## 2023-01-11 DIAGNOSIS — Z23 Encounter for immunization: Secondary | ICD-10-CM

## 2023-01-11 DIAGNOSIS — I10 Essential (primary) hypertension: Secondary | ICD-10-CM

## 2023-01-11 MED ORDER — AMLODIPINE BESYLATE 5 MG PO TABS: 5.0000 mg | ORAL_TABLET | Freq: Every day | ORAL | 3 refills | Status: DC

## 2023-01-11 MED ORDER — LOSARTAN POTASSIUM 100 MG PO TABS: 100.0000 mg | ORAL_TABLET | Freq: Every day | ORAL | 3 refills | Status: DC

## 2023-01-11 NOTE — Progress Notes (Signed)
 Established Patient Office Visit  Subjective   Patient ID: James Marks, male    DOB: March 19, 1940  Age: 82 y.o. MRN: [id number]  Chief Complaint  Patient presents with   Blood Pressure Check    HPI   James Marks is here today accompanied by wife with concerns for recent elevated blood pressures.  He currently takes amlodipine  2.5 mg each night and losartan  50 mg each morning.  Compliant with therapy.  Called in yesterday and initial message we got his that he had some slurred speech.  He was advised to go to the ER but patient declined.  On further questioning today patient denies any slurred speech.  Wife states he did not have any true slurred speech.  They denied any facial weakness.  No focal extremity weakness.  No visual changes.  Has had some general fatigue recently.  Also has some dull bilateral headache specially early in the morning past several days.  Wife brings in a log of home blood pressure readings and these have been consistently 160s to 180 systolic and 90s to 100 diastolic.  No alcohol use.  Drinks about 2 cups of coffee per day.  Does not eat out a lot but does eat things like bacon frequently.  Not monitoring sodium intake.  No recent peripheral edema.  Has chronic kidney disease stage III.  He has multiple myeloma followed closely by oncology.  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 His smoking use included cigarettes. He quit smokeless tobacco use about 10 years ago. He  reports that he does not drink alcohol and does not use drugs. family history includes Cancer in his mother; Diabetes in his mother and another family member; Diabetes Mellitus II in his brother; Hypertension in his father; Macular degeneration in his brother. No Known Allergies  Review of Systems  Constitutional:  Negative for malaise/fatigue.  Eyes:  Negative for blurred vision.  Respiratory:  Negative for shortness of breath.   Cardiovascular:  Negative for chest pain.  Neurological:  Positive for headaches. Negative for dizziness, sensory change, speech change, focal weakness, seizures, loss of consciousness and weakness.      Objective:     BP (!) 170/90   Pulse 65   Temp (!) 97.4 F (36.3 C) (Oral)   Ht 6' 1 (1.854 m)   Wt 169 lb (76.7 kg)   SpO2 97%   BMI 22.30 kg/m  BP Readings from Last 3 Encounters:  01/11/23 (!) 170/90  12/29/22 (!) 140/71  12/01/22 (!) 147/56   Wt Readings from Last 3 Encounters:  01/11/23 169 lb (76.7 kg)  12/29/22 173 lb (78.5 kg)  12/01/22 175 lb (79.4 kg)      Physical Exam Vitals reviewed.  Constitutional:      General: He is not in acute distress.    Appearance: He is well-developed. He is not ill-appearing.  HENT:  Right Ear: External ear normal.     Left Ear: External ear normal.  Eyes:     Pupils: Pupils are equal, round, and reactive to light.  Neck:     Thyroid : No thyromegaly.  Cardiovascular:     Rate and Rhythm: Normal rate and regular rhythm.  Pulmonary:     Effort: Pulmonary effort is normal. No respiratory distress.     Breath sounds: Normal breath sounds. No wheezing or rales.  Abdominal:     Comments: No renal bruits noted  Musculoskeletal:     Cervical back: Neck supple.     Right lower leg: No edema.     Left lower leg: No edema.  Neurological:     Mental Status: He is alert and oriented to person, place, and time.     Cranial Nerves: No cranial nerve deficit.     Sensory: No sensory deficit.      Motor: No weakness.      No results found for any visits on 01/11/23.    The ASCVD Risk score (Arnett DK, et al., 2019) failed to calculate for the following reasons:   The 2019 ASCVD risk score is only valid for ages 14 to 57    Assessment & Plan:   #1 hypertension poorly controlled by multiple home readings as well as readings here today.  Repeat left arm seated after rest 164/90.  Patient already on low-dose amlodipine  and losartan .  We suggested the following  -Try to lower sodium intake.  Handout on DASH diet given -Increase losartan  to 100 mg daily and increase amlodipine  to 5 mg daily -Continue close home monitoring of blood pressure -Set up 2-week follow-up  #3 history of chronic kidney disease stage III.  He is getting regular labs monitored through oncology.  Recent GFR stable.   Return in about 2 weeks (around 01/25/2023).    Wolm Scarlet, MD

## 2023-01-11 NOTE — Patient Instructions (Signed)
Increase the Losartan to 100 mg and the Amlodipine to 5 mg   Set up 2 week follow up.

## 2023-01-14 ENCOUNTER — Other Ambulatory Visit: Payer: Self-pay

## 2023-01-20 ENCOUNTER — Other Ambulatory Visit: Payer: Self-pay

## 2023-01-25 ENCOUNTER — Ambulatory Visit: Payer: Medicare Other | Admitting: Family Medicine

## 2023-01-25 ENCOUNTER — Encounter: Payer: Self-pay | Admitting: Family Medicine

## 2023-01-25 VITALS — BP 150/86 | HR 77 | Temp 98.6°F | Ht 73.0 in | Wt 168.8 lb

## 2023-01-25 DIAGNOSIS — I10 Essential (primary) hypertension: Secondary | ICD-10-CM

## 2023-01-25 DIAGNOSIS — N1832 Chronic kidney disease, stage 3b: Secondary | ICD-10-CM

## 2023-01-25 MED ORDER — CHLORTHALIDONE 25 MG PO TABS: 25.0000 mg | ORAL_TABLET | Freq: Every day | ORAL | 2 refills | Status: DC

## 2023-01-25 NOTE — Progress Notes (Signed)
 Established Patient Office Visit  Subjective   Patient ID: James Marks, male    DOB: Jul 06, 1940  Age: 83 y.o. MRN: [id number]  Chief Complaint  Patient presents with   Medical Management of Chronic Issues    HPI   Diane is here accompanied by wife to follow-up regarding hypertension.  His chronic problems include hypertension, peripheral neuropathy, chronic kidney disease, history of gout, past history of subarachnoid hemorrhage, smoldering multiple myeloma.  He is followed closely by oncology.  Last visit we increased his amlodipine  from 2.5 to 5 mg and also bumped up his losartan  from 50 to 100 mg.  They bring in a log of multiple readings today and most of his systolic readings have been 140s to 160s and has had diastolic readings mostly 80s with occasionally as high as the 100 diastolic.  He states he had good appetite but his weight is down a bit.  Compliant with medications.  He is also try to watch his sodium intake closely.  He states in the past he had stroke at 1 point with mild hyperkalemia.  Does have chronic kidney disease stage III.  No alcohol use.  No nonsteroidal use.  Past Medical History:  Diagnosis Date   Cancer (HCC)    myeloma   Diverticulitis    Hearing loss    Hx of colonic polyps    Hyperlipidemia    Hypertension    Kidney stones    Osteopenia    Smoldering multiple myeloma 08/26/2014   Subarachnoid hemorrhage (HCC) 2/01   nontraumatic   Past Surgical History:  Procedure Laterality Date   None      reports that he has quit smoking. His smoking use included cigarettes. He quit smokeless tobacco use about 10 years ago. He reports that he does not drink alcohol and does not use drugs. family history includes Cancer in his mother; Diabetes in his mother and another family member; Diabetes Mellitus II in his brother; Hypertension in his father; Macular degeneration in his brother. No Known Allergies  Review of Systems  Constitutional:  Negative for  chills, fever and malaise/fatigue.  Eyes:  Negative for blurred vision.  Respiratory:  Negative for shortness of breath.   Cardiovascular:  Negative for chest pain.  Neurological:  Negative for dizziness, weakness and headaches.      Objective:     BP (!) 150/86 (BP Location: Left Arm, Patient Position: Sitting, Cuff Size: Normal)   Pulse 77   Temp 98.6 F (37 C) (Oral)   Ht 6' 1 (1.854 m)   Wt 168 lb 12.8 oz (76.6 kg)   SpO2 96%   BMI 22.27 kg/m  BP Readings from Last 3 Encounters:  01/25/23 (!) 150/86  01/11/23 (!) 164/90  12/29/22 (!) 140/71   Wt Readings from Last 3 Encounters:  01/25/23 168 lb 12.8 oz (76.6 kg)  01/11/23 169 lb (76.7 kg)  12/29/22 173 lb (78.5 kg)      Physical Exam Vitals reviewed.  Constitutional:      Appearance: He is well-developed.  Eyes:     Pupils: Pupils are equal, round, and reactive to light.  Neck:     Thyroid : No thyromegaly.  Cardiovascular:     Rate and Rhythm: Normal rate and regular rhythm.  Pulmonary:     Effort: Pulmonary effort is normal. No respiratory distress.     Breath sounds: Normal breath sounds. No wheezing or rales.  Musculoskeletal:     Cervical back: Neck supple.  Right lower leg: No edema.     Left lower leg: No edema.  Neurological:     Mental Status: He is alert and oriented to person, place, and time.      No results found for any visits on 01/25/23.    The ASCVD Risk score (Arnett DK, et al., 2019) failed to calculate for the following reasons:   The 2019 ASCVD risk score is only valid for ages 31 to 37    Assessment & Plan:   Hypertension poorly controlled on current regimen of amlodipine  5 mg and losartan  100 mg daily.  No recent dietary changes.  We discussed cautious addition of chlorthalidone  25 mg once daily and reassess in a few weeks and recheck basic metabolic panel then.  Continue low-sodium diet.  If blood pressure not improving substantially with addition of above consider  further evaluation for secondary causes Wolm Scarlet, MD

## 2023-01-26 ENCOUNTER — Ambulatory Visit: Payer: Medicare Other

## 2023-01-26 ENCOUNTER — Ambulatory Visit: Payer: Medicare Other | Admitting: Family

## 2023-01-26 ENCOUNTER — Other Ambulatory Visit: Payer: Self-pay

## 2023-01-26 ENCOUNTER — Inpatient Hospital Stay: Payer: Medicare Other | Attending: Hematology & Oncology

## 2023-01-28 ENCOUNTER — Inpatient Hospital Stay: Payer: Medicare Other | Admitting: Hematology & Oncology

## 2023-01-28 ENCOUNTER — Inpatient Hospital Stay: Payer: Medicare Other | Attending: Hematology & Oncology

## 2023-01-28 ENCOUNTER — Inpatient Hospital Stay: Payer: Medicare Other

## 2023-01-28 ENCOUNTER — Encounter: Payer: Self-pay | Admitting: Hematology & Oncology

## 2023-01-28 ENCOUNTER — Other Ambulatory Visit: Payer: Self-pay

## 2023-01-28 VITALS — BP 122/69 | HR 66 | Temp 97.6°F | Resp 16 | Ht 73.0 in | Wt 165.0 lb

## 2023-01-28 DIAGNOSIS — D472 Monoclonal gammopathy: Secondary | ICD-10-CM

## 2023-01-28 DIAGNOSIS — Z79899 Other long term (current) drug therapy: Secondary | ICD-10-CM | POA: Diagnosis not present

## 2023-01-28 DIAGNOSIS — R634 Abnormal weight loss: Secondary | ICD-10-CM | POA: Insufficient documentation

## 2023-01-28 DIAGNOSIS — R5383 Other fatigue: Secondary | ICD-10-CM | POA: Diagnosis not present

## 2023-01-28 DIAGNOSIS — I10 Essential (primary) hypertension: Secondary | ICD-10-CM | POA: Diagnosis not present

## 2023-01-28 DIAGNOSIS — C9000 Multiple myeloma not having achieved remission: Secondary | ICD-10-CM | POA: Diagnosis present

## 2023-01-28 DIAGNOSIS — Z5112 Encounter for antineoplastic immunotherapy: Secondary | ICD-10-CM | POA: Insufficient documentation

## 2023-01-28 DIAGNOSIS — D509 Iron deficiency anemia, unspecified: Secondary | ICD-10-CM

## 2023-01-28 LAB — CBC WITH DIFFERENTIAL (CANCER CENTER ONLY)
Abs Immature Granulocytes: 0.02 10*3/uL (ref 0.00–0.07)
Basophils Absolute: 0 10*3/uL (ref 0.0–0.1)
Basophils Relative: 0 %
Eosinophils Absolute: 0.4 10*3/uL (ref 0.0–0.5)
Eosinophils Relative: 4 %
HCT: 42.4 % (ref 39.0–52.0)
Hemoglobin: 13.8 g/dL (ref 13.0–17.0)
Immature Granulocytes: 0 %
Lymphocytes Relative: 25 %
Lymphs Abs: 2.1 10*3/uL (ref 0.7–4.0)
MCH: 29.6 pg (ref 26.0–34.0)
MCHC: 32.5 g/dL (ref 30.0–36.0)
MCV: 90.8 fL (ref 80.0–100.0)
Monocytes Absolute: 0.7 10*3/uL (ref 0.1–1.0)
Monocytes Relative: 8 %
Neutro Abs: 5.2 10*3/uL (ref 1.7–7.7)
Neutrophils Relative %: 63 %
Platelet Count: 196 10*3/uL (ref 150–400)
RBC: 4.67 MIL/uL (ref 4.22–5.81)
RDW: 13.1 % (ref 11.5–15.5)
WBC Count: 8.3 10*3/uL (ref 4.0–10.5)
nRBC: 0 % (ref 0.0–0.2)

## 2023-01-28 LAB — CMP (CANCER CENTER ONLY)
ALT: 7 U/L (ref 0–44)
AST: 11 U/L — ABNORMAL LOW (ref 15–41)
Albumin: 4.5 g/dL (ref 3.5–5.0)
Alkaline Phosphatase: 127 U/L — ABNORMAL HIGH (ref 38–126)
Anion gap: 8 (ref 5–15)
BUN: 29 mg/dL — ABNORMAL HIGH (ref 8–23)
CO2: 29 mmol/L (ref 22–32)
Calcium: 9.5 mg/dL (ref 8.9–10.3)
Chloride: 104 mmol/L (ref 98–111)
Creatinine: 1.86 mg/dL — ABNORMAL HIGH (ref 0.61–1.24)
GFR, Estimated: 35 mL/min — ABNORMAL LOW (ref >60)
Glucose, Bld: 122 mg/dL — ABNORMAL HIGH (ref 70–99)
Potassium: 4.6 mmol/L (ref 3.5–5.1)
Sodium: 141 mmol/L (ref 135–145)
Total Bilirubin: 0.5 mg/dL (ref 0.0–1.2)
Total Protein: 6.7 g/dL (ref 6.5–8.1)

## 2023-01-28 LAB — LACTATE DEHYDROGENASE: LDH: 115 U/L (ref 98–192)

## 2023-01-28 MED ORDER — DIPHENHYDRAMINE HCL 25 MG PO CAPS
50.0000 mg | ORAL_CAPSULE | Freq: Once | ORAL | Status: DC
Start: 2023-01-28 — End: 2023-01-28

## 2023-01-28 MED ORDER — DARATUMUMAB-HYALURONIDASE-FIHJ 1800-30000 MG-UT/15ML ~~LOC~~ SOLN
1800.0000 mg | Freq: Once | SUBCUTANEOUS | Status: AC
Start: 2023-01-28 — End: 2023-01-28
  Administered 2023-01-28: 1800 mg via SUBCUTANEOUS
  Filled 2023-01-28: qty 15

## 2023-01-28 MED ORDER — DEXAMETHASONE 4 MG PO TABS
20.0000 mg | ORAL_TABLET | Freq: Once | ORAL | Status: DC
Start: 2023-01-28 — End: 2023-01-28

## 2023-01-28 MED ORDER — ACETAMINOPHEN 325 MG PO TABS
650.0000 mg | ORAL_TABLET | Freq: Once | ORAL | Status: DC
Start: 2023-01-28 — End: 2023-01-28

## 2023-01-28 NOTE — Progress Notes (Signed)
Hematology and Oncology Follow Up Visit  James Marks [id number] 04-29-1940 83 y.o. 01/28/2023   Principle Diagnosis:  IgA kappa  myeloma  Past Therapy: Velcade/Revlimid/Decadron -- s/p cycle #6 -- start on 07/27/2021   Current Therapy:        Faspro/Velcade/Revlimid/Decadron -- s/p cycle #7 - start  on 01/28/2022 --Revlimid  DC'd on 02/24/2022 - Velcade d/c on 12/01/2022   Interim History:  James Marks is here today for follow-up and Faspro.  He apparently is having blood pressure problems.  His wife told me all about this.  He sees his family doctor for this.  As always, Dr. Sander Radon is incredibly aggressive and trying to get his blood pressure down.  He now is on losartan, chlorthalidone, and amlodipine.  His blood pressure today was quite good at 122/69.  Again he is done incredibly well with just the Faspro.  His last monoclonal spike was not even found.  His IgA level was 108 mg/dL.  The Kappa light chain was 2.7 mg/dL.  He has had no problems with bowels or bladder.  He has had no rashes.  He has had no leg swelling.  He has had no cough or shortness of breath.  His weight is down a little bit.  His appetite is managing okay.  Overall, I would say that his performance status is probably ECOG 1.   Medications:  Allergies as of 01/28/2023   No Known Allergies      Medication List        Accurate as of January 28, 2023  9:01 AM. If you have any questions, ask your nurse or doctor.          acetaminophen 325 MG tablet Commonly known as: TYLENOL Take 325 mg by mouth. 02/10/2022 Takes 325 mg one hour prior to antibody treatment.   acyclovir 400 MG tablet Commonly known as: ZOVIRAX TAKE 1 TABLET(400 MG) BY MOUTH TWICE DAILY   amLODipine 5 MG tablet Commonly known as: NORVASC Take 1 tablet (5 mg total) by mouth daily.   amoxicillin 500 MG capsule Commonly known as: AMOXIL Take four tablets (2000 mg) total one hour prior to dental procedure.   BABY ASPIRIN  PO Take 81 mg by mouth daily.   chlorthalidone 25 MG tablet Commonly known as: HYGROTON Take 1 tablet (25 mg total) by mouth daily.   dexamethasone 4 MG tablet Commonly known as: DECADRON TAKE 5 TABLETS BY MOUTH 1 HOUR PRIOR TO CHEMO TREATMENT   diphenhydrAMINE 25 mg capsule Commonly known as: BENADRYL Take 50 mg by mouth. 02/10/2022 Takes one hour prior to antibody treatment.   gabapentin 300 MG capsule Commonly known as: NEURONTIN TAKE 1 CAPSULE BY MOUTH EVERY DAY WITH BREAKFAST, 1 CAPSULE WITH DINNER AND 2 CAPSULES AT BEDTIME   losartan 100 MG tablet Commonly known as: COZAAR Take 1 tablet (100 mg total) by mouth daily.   montelukast 10 MG tablet Commonly known as: Singulair Take 1 tablet (10 mg total) by mouth at bedtime.   ondansetron 8 MG tablet Commonly known as: Zofran Take 1 tablet (8 mg total) by mouth every 8 (eight) hours as needed for nausea or vomiting.   PRESERVISION AREDS 2 PO Take by mouth daily at 6 (six) AM.   prochlorperazine 10 MG tablet Commonly known as: COMPAZINE Take 1 tablet (10 mg total) by mouth every 6 (six) hours as needed for nausea or vomiting.   traZODone 50 MG tablet Commonly known as: DESYREL Take 1 tablet (50 mg total)  by mouth at bedtime.        Allergies: No Known Allergies  Past Medical History, Surgical history, Social history, and Family History were reviewed and updated.  Review of Systems:  Review of Systems  Constitutional:  Positive for malaise/fatigue and weight loss.  HENT: Negative.    Eyes: Negative.   Respiratory: Negative.    Cardiovascular: Negative.   Gastrointestinal: Negative.   Genitourinary: Negative.   Musculoskeletal: Negative.   Skin: Negative.   Neurological: Negative.   Endo/Heme/Allergies: Negative.   Psychiatric/Behavioral: Negative.        Physical Exam:  Vital signs show temperature of 97.6.  Pulse 66.  Blood pressure 122/69.  Weight is 165 pounds.   Wt Readings from Last 3  Encounters:  01/25/23 168 lb 12.8 oz (76.6 kg)  01/11/23 169 lb (76.7 kg)  12/29/22 173 lb (78.5 kg)    Physical Exam Vitals reviewed.  HENT:     Head: Normocephalic and atraumatic.  Eyes:     Pupils: Pupils are equal, round, and reactive to light.  Cardiovascular:     Rate and Rhythm: Normal rate and regular rhythm.     Heart sounds: Normal heart sounds.  Pulmonary:     Effort: Pulmonary effort is normal.     Breath sounds: Normal breath sounds.  Abdominal:     General: Bowel sounds are normal.     Palpations: Abdomen is soft.  Musculoskeletal:        General: No tenderness or deformity. Normal range of motion.     Cervical back: Normal range of motion.  Lymphadenopathy:     Cervical: No cervical adenopathy.  Skin:    General: Skin is warm and dry.     Findings: No erythema or rash.  Neurological:     Mental Status: He is alert and oriented to person, place, and time.  Psychiatric:        Behavior: Behavior normal.        Thought Content: Thought content normal.        Judgment: Judgment normal.      Lab Results  Component Value Date   WBC 8.3 01/28/2023   HGB 13.8 01/28/2023   HCT 42.4 01/28/2023   MCV 90.8 01/28/2023   PLT 196 01/28/2023   Lab Results  Component Value Date   FERRITIN 79 12/16/2021   IRON 57 03/25/2022   TIBC 309 03/25/2022   UIBC 252 03/25/2022   IRONPCTSAT 18 03/25/2022   Lab Results  Component Value Date   RETICCTPCT 1.0 12/02/2021   RBC 4.67 01/28/2023   Lab Results  Component Value Date   KPAFRELGTCHN 27.0 (H) 12/29/2022   LAMBDASER 11.0 12/29/2022   KAPLAMBRATIO 2.45 (H) 12/29/2022   Lab Results  Component Value Date   IGGSERUM 602 (L) 12/29/2022   IGA 108 12/29/2022   IGMSERUM 32 12/29/2022   Lab Results  Component Value Date   TOTALPROTELP 6.0 12/29/2022   ALBUMINELP 3.7 12/29/2022   A1GS 0.2 12/29/2022   A2GS 0.7 12/29/2022   BETS 0.8 12/29/2022   BETA2SER 0.9 (H) 12/27/2014   GAMS 0.6 12/29/2022   MSPIKE Not  Observed 12/29/2022   SPEI Comment 10/06/2022     Chemistry      Component Value Date/Time   NA 145 12/29/2022 0934   NA 143 12/06/2016 1156   NA 139 08/02/2016 1146   K 4.3 12/29/2022 0934   K 4.2 12/06/2016 1156   K 4.7 08/02/2016 1146   CL 107 12/29/2022 0934  CL 107 12/06/2016 1156   CO2 30 12/29/2022 0934   CO2 28 12/06/2016 1156   CO2 24 08/02/2016 1146   BUN 19 12/29/2022 0934   BUN 29 (H) 12/06/2016 1156   BUN 34.3 (H) 08/02/2016 1146   CREATININE 1.28 (H) 12/29/2022 0934   CREATININE 2.1 (H) 12/06/2016 1156   CREATININE 1.7 (H) 08/02/2016 1146      Component Value Date/Time   CALCIUM 9.1 12/29/2022 0934   CALCIUM 9.1 12/06/2016 1156   CALCIUM 9.5 08/02/2016 1146   ALKPHOS 98 12/29/2022 0934   ALKPHOS 108 (H) 12/06/2016 1156   ALKPHOS 115 08/02/2016 1146   AST 11 (L) 12/29/2022 0934   AST 14 08/02/2016 1146   ALT 7 12/29/2022 0934   ALT 18 12/06/2016 1156   ALT 9 08/02/2016 1146   BILITOT 0.4 12/29/2022 0934   BILITOT 0.41 08/02/2016 1146       Impression and Plan: Mr. Koda is 83 year old man with IgA kappa myeloma.  Currently, he is going on Faspro.  He really has done incredibly well.  I hate that the blood pressure has been an issue for him.  It sounds like he is on a good regimen right now.  His blood pressure in the office was quite good.  For right now, we will just continue him on the Faspro.  He has a birthday coming up in a couple weeks.  I am very happy about this.  I know he will have a wonderful birthday.  We will plan to get him back in 1 month.   [name], MD 1/17/20259:01 AM

## 2023-01-28 NOTE — Progress Notes (Signed)
OK to treat per order of Dr. Myna Hidalgo with creat-1.86 per order of Dr. Myna Hidalgo.

## 2023-01-28 NOTE — Patient Instructions (Signed)
 [facility] - A DEPT OF [facility]  Discharge Instructions: Thank you for choosing [facility] to provide your oncology and hematology care.   If you have a lab appointment with the Cancer Center, please go directly to the Cancer Center and check in at the registration area.  Wear comfortable clothing and clothing appropriate for easy access to any Portacath or PICC line.   We strive to give you quality time with your provider. You may need to reschedule your appointment if you arrive late (15 or more minutes).  Arriving late affects you and other patients whose appointments are after yours.  Also, if you miss three or more appointments without notifying the office, you may be dismissed from the clinic at the provider's discretion.      For prescription refill requests, have your pharmacy contact our office and allow 72 hours for refills to be completed.    Today you received the following chemotherapy and/or immunotherapy agents Darzalex      To help prevent nausea and vomiting after your treatment, we encourage you to take your nausea medication as directed.  BELOW ARE SYMPTOMS THAT SHOULD BE REPORTED IMMEDIATELY: *FEVER GREATER THAN 100.4 F (38 C) OR HIGHER *CHILLS OR SWEATING *NAUSEA AND VOMITING THAT IS NOT CONTROLLED WITH YOUR NAUSEA MEDICATION *UNUSUAL SHORTNESS OF BREATH *UNUSUAL BRUISING OR BLEEDING *URINARY PROBLEMS (pain or burning when urinating, or frequent urination) *BOWEL PROBLEMS (unusual diarrhea, constipation, pain near the anus) TENDERNESS IN MOUTH AND THROAT WITH OR WITHOUT PRESENCE OF ULCERS (sore throat, sores in mouth, or a toothache) UNUSUAL RASH, SWELLING OR PAIN  UNUSUAL VAGINAL DISCHARGE OR ITCHING   Items with * indicate a potential emergency and should be followed up as soon as possible or go to the Emergency Department if any problems should occur.  Please show the CHEMOTHERAPY ALERT CARD or IMMUNOTHERAPY  ALERT CARD at check-in to the Emergency Department and triage nurse. Should you have questions after your visit or need to cancel or reschedule your appointment, please contact [facility] - A DEPT OF [facility]  [phone] and follow the prompts.  Office hours are 8:00 a.m. to 4:30 p.m. Monday - Friday. Please note that voicemails left after 4:00 p.m. may not be returned until the following business day.  We are closed weekends and major holidays. You have access to a nurse at all times for urgent questions. Please call the main number to the clinic [phone] and follow the prompts.  For any non-urgent questions, you may also contact your provider using MyChart. We now offer e-Visits for anyone 9 and older to request care online for non-urgent symptoms. For details visit [email or web address].   Also download the MyChart app! Go to the app store, search "MyChart", open the app, select [place], and log in with your MyChart username and password.

## 2023-01-30 ENCOUNTER — Other Ambulatory Visit: Payer: Self-pay

## 2023-01-30 LAB — IGG, IGA, IGM
IgA: 129 mg/dL (ref 61–437)
IgG (Immunoglobin G), Serum: 659 mg/dL (ref 603–1613)
IgM (Immunoglobulin M), Srm: 34 mg/dL (ref 15–143)

## 2023-01-31 LAB — KAPPA/LAMBDA LIGHT CHAINS
Kappa free light chain: 33.5 mg/L — ABNORMAL HIGH (ref 3.3–19.4)
Kappa, lambda light chain ratio: 2.77 — ABNORMAL HIGH (ref 0.26–1.65)
Lambda free light chains: 12.1 mg/L (ref 5.7–26.3)

## 2023-02-04 ENCOUNTER — Telehealth: Payer: Self-pay

## 2023-02-04 LAB — PROTEIN ELECTROPHORESIS, SERUM
A/G Ratio: 1.6 (ref 0.7–1.7)
Albumin ELP: 3.9 g/dL (ref 2.9–4.4)
Alpha-1-Globulin: 0.2 g/dL (ref 0.0–0.4)
Alpha-2-Globulin: 0.7 g/dL (ref 0.4–1.0)
Beta Globulin: 0.9 g/dL (ref 0.7–1.3)
Gamma Globulin: 0.5 g/dL (ref 0.4–1.8)
Globulin, Total: 2.4 g/dL (ref 2.2–3.9)
Total Protein ELP: 6.3 g/dL (ref 6.0–8.5)

## 2023-02-04 NOTE — Telephone Encounter (Signed)
Advised via MyChart.

## 2023-02-04 NOTE — Telephone Encounter (Signed)
-----   Message from [name] sent at 02/04/2023  4:32 PM EST ----- Please call and let him know that the light chain is up a little bit.  Still not all that bad.  I would still not be any changes to his protocol.  Thanks.  Cindee Lame

## 2023-02-06 ENCOUNTER — Other Ambulatory Visit: Payer: Self-pay

## 2023-02-21 ENCOUNTER — Telehealth: Payer: Self-pay

## 2023-02-21 ENCOUNTER — Other Ambulatory Visit: Payer: Self-pay

## 2023-02-21 ENCOUNTER — Encounter: Payer: Self-pay | Admitting: Family Medicine

## 2023-02-21 ENCOUNTER — Ambulatory Visit: Payer: Medicare Other | Admitting: Family Medicine

## 2023-02-21 VITALS — BP 110/60 | HR 74 | Temp 97.6°F | Wt 168.1 lb

## 2023-02-21 DIAGNOSIS — I10 Essential (primary) hypertension: Secondary | ICD-10-CM

## 2023-02-21 DIAGNOSIS — D472 Monoclonal gammopathy: Secondary | ICD-10-CM

## 2023-02-21 MED ORDER — METRONIDAZOLE 500 MG PO TABS: 500.0000 mg | ORAL_TABLET | Freq: Three times a day (TID) | ORAL | 0 refills | Status: DC

## 2023-02-21 NOTE — Progress Notes (Signed)
 Established Patient Office Visit  Subjective   Patient ID: James Marks, male    DOB: 08/26/1940  Age: 83 y.o. MRN: [id number]  Chief Complaint  Patient presents with   Medical Management of Chronic Issues    HPI   James Marks has history of hypertension, peripheral neuropathy, multiple myeloma, prediabetes, chronic kidney disease.  He is seen today for follow-up hypertension.  He had been on regimen of amlodipine  5 mg daily and losartan  100 mg daily but was still having significant elevated readings at home as high as 190 systolic.  We added chlorthalidone  25 mg once daily and blood pressures at home have been considerably improved.  He is consistently been below 130 systolic and below 80 diastolic over the past couple weeks.  No adverse side effects.  He is due to get follow-up labs through hematology/oncology this Friday.  He denies any dizziness.  Past Medical History:  Diagnosis Date   Cancer (HCC)    myeloma   Diverticulitis    Hearing loss    Hx of colonic polyps    Hyperlipidemia    Hypertension    Kidney stones    Osteopenia    Smoldering multiple myeloma 08/26/2014   Subarachnoid hemorrhage (HCC) 2/01   nontraumatic   Past Surgical History:  Procedure Laterality Date   None      reports that he has quit smoking. His smoking use included cigarettes. He quit smokeless tobacco use about 10 years ago. He reports that he does not drink alcohol and does not use drugs. family history includes Cancer in his mother; Diabetes in his mother and another family member; Diabetes Mellitus II in his brother; Hypertension in his father; Macular degeneration in his brother. No Known Allergies  Review of Systems  Constitutional:  Negative for malaise/fatigue.  Eyes:  Negative for blurred vision.  Respiratory:  Negative for shortness of breath.   Cardiovascular:  Negative for chest pain.  Neurological:  Negative for dizziness, weakness and headaches.      Objective:      BP 110/60 (BP Location: Left Arm, Patient Position: Sitting, Cuff Size: Normal)   Pulse 74   Temp 97.6 F (36.4 C) (Oral)   Wt 168 lb 1.6 oz (76.2 kg)   SpO2 96%   BMI 22.18 kg/m  BP Readings from Last 3 Encounters:  02/21/23 110/60  01/28/23 122/69  01/25/23 (!) 150/86   Wt Readings from Last 3 Encounters:  02/21/23 168 lb 1.6 oz (76.2 kg)  01/28/23 165 lb (74.8 kg)  01/25/23 168 lb 12.8 oz (76.6 kg)      Physical Exam Vitals reviewed.  Constitutional:      General: He is not in acute distress.    Appearance: He is not ill-appearing.  Cardiovascular:     Rate and Rhythm: Normal rate and regular rhythm.  Pulmonary:     Effort: Pulmonary effort is normal.     Breath sounds: Normal breath sounds. No wheezing or rales.  Musculoskeletal:     Right lower leg: No edema.     Left lower leg: No edema.  Neurological:     Mental Status: He is alert.      No results found for any visits on 02/21/23.    The ASCVD Risk score (Arnett DK, et al., 2019) failed to calculate for the following reasons:   The 2019 ASCVD risk score is only valid for ages 48 to 29    Assessment & Plan:   Problem List Items  Addressed This Visit       Unprioritized   Essential hypertension - Primary   Hypertension significantly improved following addition of chlorthalidone  to his amlodipine  and losartan  regimen.  Does need follow-up electrolytes to assess stability of his sodium and potassium and he is already scheduled to get CMP this Friday.  Reminder to increase potassium rich foods in diet  Continue home monitoring of blood pressure periodically. No follow-ups on file.    James Lamy, MD

## 2023-02-21 NOTE — Telephone Encounter (Signed)
 Received call from Dr Jackson Massed dental office reporting that they have been treating patient for a generalized periodontal gum infection with 7 days of Amoxicillin . Patient has not improved, so Dr Rochelle Chu is requesting that Dr Maria Shiner order a broad spectrum antibiotic of his choice. Flagyl  sent to [facility]. dph

## 2023-02-25 ENCOUNTER — Inpatient Hospital Stay: Payer: Medicare Other | Admitting: Hematology & Oncology

## 2023-02-25 ENCOUNTER — Encounter: Payer: Self-pay | Admitting: Hematology & Oncology

## 2023-02-25 ENCOUNTER — Inpatient Hospital Stay: Payer: Medicare Other | Attending: Hematology & Oncology

## 2023-02-25 ENCOUNTER — Inpatient Hospital Stay: Payer: Medicare Other

## 2023-02-25 VITALS — BP 91/46 | HR 81 | Temp 97.6°F | Resp 18 | Ht 73.0 in | Wt 168.0 lb

## 2023-02-25 VITALS — BP 101/49 | HR 67 | Resp 18

## 2023-02-25 DIAGNOSIS — R634 Abnormal weight loss: Secondary | ICD-10-CM | POA: Insufficient documentation

## 2023-02-25 DIAGNOSIS — D472 Monoclonal gammopathy: Secondary | ICD-10-CM | POA: Diagnosis not present

## 2023-02-25 DIAGNOSIS — I10 Essential (primary) hypertension: Secondary | ICD-10-CM | POA: Insufficient documentation

## 2023-02-25 DIAGNOSIS — Z79899 Other long term (current) drug therapy: Secondary | ICD-10-CM | POA: Insufficient documentation

## 2023-02-25 DIAGNOSIS — Z5112 Encounter for antineoplastic immunotherapy: Secondary | ICD-10-CM | POA: Diagnosis present

## 2023-02-25 DIAGNOSIS — R5383 Other fatigue: Secondary | ICD-10-CM | POA: Diagnosis not present

## 2023-02-25 DIAGNOSIS — C9000 Multiple myeloma not having achieved remission: Secondary | ICD-10-CM | POA: Insufficient documentation

## 2023-02-25 LAB — CBC WITH DIFFERENTIAL (CANCER CENTER ONLY)
Abs Immature Granulocytes: 0.05 10*3/uL (ref 0.00–0.07)
Basophils Absolute: 0.1 10*3/uL (ref 0.0–0.1)
Basophils Relative: 1 %
Eosinophils Absolute: 0.5 10*3/uL (ref 0.0–0.5)
Eosinophils Relative: 6 %
HCT: 40.4 % (ref 39.0–52.0)
Hemoglobin: 13.2 g/dL (ref 13.0–17.0)
Immature Granulocytes: 1 %
Lymphocytes Relative: 19 %
Lymphs Abs: 1.6 10*3/uL (ref 0.7–4.0)
MCH: 29.7 pg (ref 26.0–34.0)
MCHC: 32.7 g/dL (ref 30.0–36.0)
MCV: 90.8 fL (ref 80.0–100.0)
Monocytes Absolute: 0.5 10*3/uL (ref 0.1–1.0)
Monocytes Relative: 6 %
Neutro Abs: 5.8 10*3/uL (ref 1.7–7.7)
Neutrophils Relative %: 67 %
Platelet Count: 211 10*3/uL (ref 150–400)
RBC: 4.45 MIL/uL (ref 4.22–5.81)
RDW: 13.7 % (ref 11.5–15.5)
WBC Count: 8.5 10*3/uL (ref 4.0–10.5)
nRBC: 0 % (ref 0.0–0.2)

## 2023-02-25 LAB — CMP (CANCER CENTER ONLY)
ALT: 7 U/L (ref 0–44)
AST: 11 U/L — ABNORMAL LOW (ref 15–41)
Albumin: 4.3 g/dL (ref 3.5–5.0)
Alkaline Phosphatase: 122 U/L (ref 38–126)
Anion gap: 10 (ref 5–15)
BUN: 44 mg/dL — ABNORMAL HIGH (ref 8–23)
CO2: 26 mmol/L (ref 22–32)
Calcium: 9.6 mg/dL (ref 8.9–10.3)
Chloride: 105 mmol/L (ref 98–111)
Creatinine: 2.19 mg/dL — ABNORMAL HIGH (ref 0.61–1.24)
GFR, Estimated: 29 mL/min — ABNORMAL LOW (ref >60)
Glucose, Bld: 142 mg/dL — ABNORMAL HIGH (ref 70–99)
Potassium: 5 mmol/L (ref 3.5–5.1)
Sodium: 141 mmol/L (ref 135–145)
Total Bilirubin: 0.4 mg/dL (ref 0.0–1.2)
Total Protein: 6.6 g/dL (ref 6.5–8.1)

## 2023-02-25 LAB — LACTATE DEHYDROGENASE: LDH: 111 U/L (ref 98–192)

## 2023-02-25 MED ORDER — ACETAMINOPHEN 325 MG PO TABS: 650.0000 mg | ORAL_TABLET | Freq: Once | ORAL | Status: DC

## 2023-02-25 MED ORDER — DEXAMETHASONE 4 MG PO TABS
20.0000 mg | ORAL_TABLET | Freq: Once | ORAL | Status: DC
Start: 2023-02-25 — End: 2023-02-25

## 2023-02-25 MED ORDER — DARATUMUMAB-HYALURONIDASE-FIHJ 1800-30000 MG-UT/15ML ~~LOC~~ SOLN
1800.0000 mg | Freq: Once | SUBCUTANEOUS | Status: AC
  Administered 2023-02-25: 1800 mg via SUBCUTANEOUS
  Filled 2023-02-25: qty 15

## 2023-02-25 MED ORDER — DIPHENHYDRAMINE HCL 25 MG PO CAPS: 50.0000 mg | ORAL_CAPSULE | Freq: Once | ORAL | Status: DC

## 2023-02-25 NOTE — Progress Notes (Signed)
Hematology and Oncology Follow Up Visit  James Marks [id number] [birth year] 83 y.o. 02/25/2023   Principle Diagnosis:  IgA kappa  myeloma  Past Therapy: Velcade/Revlimid/Decadron -- s/p cycle #6 -- start on 07/27/2021   Current Therapy:        Faspro/Velcade/Revlimid/Decadron -- s/p cycle #7 - start  on 01/28/2022 --Revlimid  DC'd on 02/24/2022 - Velcade d/c on 12/01/2022   Interim History:  James Marks is here today for follow-up and Faspro.  His problem right now is that he has low blood pressure.  I know he had blood pressure medications readjusted recently by Dr. Sander Radon.  As always, Dr. Sander Radon is incredibly thorough with his patients and with his blood pressure monitoring and control.  I made some adjustments on the blood pressure medications.  I noted that his renal function was also worsening.  I worried that this also might be from his blood pressure medications.  He also is having some issues with his gums.  He saw his dentist.  He was put on amoxicillin.  He still is having some issues.  The dentist wanted [place] to deal with this.  We have him on Flagyl right now.  When I looked at his oral cavity, I cannot find anything that looks like an infection.  He will finish up 7 days of the Flagyl.  The fact that he still smoking certain is not going to help his periodontal issues.  I think he is well with the single agent Faspro.  He was having a lot of problems with the Revlimid and Velcade.  These have been stopped.  The Revlimid has been stopped a year ago.  The Velcade was stopped about 3 months ago.  His last monoclonal studies did not show a monoclonal spike in his blood.  His IgG a level was 129 mg/dL.  The Kappa light chain was 3.5 mg/dL.  He has had no change in bowel or bladder habits.  He has had no problems with falling.  He has had no headache.  He has had no rashes.  He is going out and play golf every now and then.  Overall, I would have to say that his performance  status is probably ECOG 1.     Medications:  Allergies as of 02/25/2023   No Known Allergies      Medication List        Accurate as of February 25, 2023 11:15 AM. If you have any questions, ask your nurse or doctor.          acetaminophen 325 MG tablet Commonly known as: TYLENOL Take 325 mg by mouth. 02/10/2022 Takes 325 mg one hour prior to antibody treatment.   acyclovir 400 MG tablet Commonly known as: ZOVIRAX TAKE 1 TABLET(400 MG) BY MOUTH TWICE DAILY   amLODipine 5 MG tablet Commonly known as: NORVASC Take 1 tablet (5 mg total) by mouth daily.   amoxicillin 500 MG capsule Commonly known as: AMOXIL Take four tablets (2000 mg) total one hour prior to dental procedure.   BABY ASPIRIN PO Take 81 mg by mouth daily.   chlorthalidone 25 MG tablet Commonly known as: HYGROTON Take 1 tablet (25 mg total) by mouth daily.   dexamethasone 4 MG tablet Commonly known as: DECADRON TAKE 5 TABLETS BY MOUTH 1 HOUR PRIOR TO CHEMO TREATMENT   diphenhydrAMINE 25 mg capsule Commonly known as: BENADRYL Take 50 mg by mouth. 02/10/2022 Takes one hour prior to antibody treatment.   gabapentin 300 MG capsule  Commonly known as: NEURONTIN TAKE 1 CAPSULE BY MOUTH EVERY DAY WITH BREAKFAST, 1 CAPSULE WITH DINNER AND 2 CAPSULES AT BEDTIME   losartan 100 MG tablet Commonly known as: COZAAR Take 1 tablet (100 mg total) by mouth daily.   metroNIDAZOLE 500 MG tablet Commonly known as: FLAGYL Take 1 tablet (500 mg total) by mouth 3 (three) times daily.   montelukast 10 MG tablet Commonly known as: Singulair Take 1 tablet (10 mg total) by mouth at bedtime.   ondansetron 8 MG tablet Commonly known as: Zofran Take 1 tablet (8 mg total) by mouth every 8 (eight) hours as needed for nausea or vomiting.   PRESERVISION AREDS 2 PO Take by mouth daily at 6 (six) AM.   prochlorperazine 10 MG tablet Commonly known as: COMPAZINE Take 1 tablet (10 mg total) by mouth every 6 (six) hours as  needed for nausea or vomiting.   traZODone 50 MG tablet Commonly known as: DESYREL Take 1 tablet (50 mg total) by mouth at bedtime.        Allergies: No Known Allergies  Past Medical History, Surgical history, Social history, and Family History were reviewed and updated.  Review of Systems:  Review of Systems  Constitutional:  Positive for malaise/fatigue and weight loss.  HENT: Negative.    Eyes: Negative.   Respiratory: Negative.    Cardiovascular: Negative.   Gastrointestinal: Negative.   Genitourinary: Negative.   Musculoskeletal: Negative.   Skin: Negative.   Neurological: Negative.   Endo/Heme/Allergies: Negative.   Psychiatric/Behavioral: Negative.        Physical Exam:  Vital signs show temperature of 97.6.  Pulse 81.  Blood pressure 91/46.  Weight is 168 pounds.     Wt Readings from Last 3 Encounters:  02/25/23 168 lb (76.2 kg)  02/21/23 168 lb 1.6 oz (76.2 kg)  01/28/23 165 lb (74.8 kg)    Physical Exam Vitals reviewed.  HENT:     Head: Normocephalic and atraumatic.  Eyes:     Pupils: Pupils are equal, round, and reactive to light.  Cardiovascular:     Rate and Rhythm: Normal rate and regular rhythm.     Heart sounds: Normal heart sounds.  Pulmonary:     Effort: Pulmonary effort is normal.     Breath sounds: Normal breath sounds.  Abdominal:     General: Bowel sounds are normal.     Palpations: Abdomen is soft.  Musculoskeletal:        General: No tenderness or deformity. Normal range of motion.     Cervical back: Normal range of motion.  Lymphadenopathy:     Cervical: No cervical adenopathy.  Skin:    General: Skin is warm and dry.     Findings: No erythema or rash.  Neurological:     Mental Status: He is alert and oriented to person, place, and time.  Psychiatric:        Behavior: Behavior normal.        Thought Content: Thought content normal.        Judgment: Judgment normal.      Lab Results  Component Value Date   WBC 8.5  02/25/2023   HGB 13.2 02/25/2023   HCT 40.4 02/25/2023   MCV 90.8 02/25/2023   PLT 211 02/25/2023   Lab Results  Component Value Date   FERRITIN 79 12/16/2021   IRON 57 03/25/2022   TIBC 309 03/25/2022   UIBC 252 03/25/2022   IRONPCTSAT 18 03/25/2022   Lab Results  Component  Value Date   RETICCTPCT 1.0 12/02/2021   RBC 4.45 02/25/2023   Lab Results  Component Value Date   KPAFRELGTCHN 33.5 (H) 01/28/2023   LAMBDASER 12.1 01/28/2023   KAPLAMBRATIO 2.77 (H) 01/28/2023   Lab Results  Component Value Date   IGGSERUM 659 01/28/2023   IGA 129 01/28/2023   IGMSERUM 34 01/28/2023   Lab Results  Component Value Date   TOTALPROTELP 6.3 01/28/2023   ALBUMINELP 3.9 01/28/2023   A1GS 0.2 01/28/2023   A2GS 0.7 01/28/2023   BETS 0.9 01/28/2023   BETA2SER 0.9 (H) 12/27/2014   GAMS 0.5 01/28/2023   MSPIKE Not Observed 01/28/2023   SPEI Comment 01/28/2023     Chemistry      Component Value Date/Time   NA 141 02/25/2023 1013   NA 143 12/06/2016 1156   NA 139 08/02/2016 1146   K 5.0 02/25/2023 1013   K 4.2 12/06/2016 1156   K 4.7 08/02/2016 1146   CL 105 02/25/2023 1013   CL 107 12/06/2016 1156   CO2 26 02/25/2023 1013   CO2 28 12/06/2016 1156   CO2 24 08/02/2016 1146   BUN 44 (H) 02/25/2023 1013   BUN 29 (H) 12/06/2016 1156   BUN 34.3 (H) 08/02/2016 1146   CREATININE 2.19 (H) 02/25/2023 1013   CREATININE 2.1 (H) 12/06/2016 1156   CREATININE 1.7 (H) 08/02/2016 1146      Component Value Date/Time   CALCIUM 9.6 02/25/2023 1013   CALCIUM 9.1 12/06/2016 1156   CALCIUM 9.5 08/02/2016 1146   ALKPHOS 122 02/25/2023 1013   ALKPHOS 108 (H) 12/06/2016 1156   ALKPHOS 115 08/02/2016 1146   AST 11 (L) 02/25/2023 1013   AST 14 08/02/2016 1146   ALT 7 02/25/2023 1013   ALT 18 12/06/2016 1156   ALT 9 08/02/2016 1146   BILITOT 0.4 02/25/2023 1013   BILITOT 0.41 08/02/2016 1146       Impression and Plan: James Marks is 83 year old man with IgA kappa myeloma.  Currently, he  is going on Faspro.  He really has done incredibly well.  I hate that the blood pressure has been an issue for him.  Now, it is too low.  Again I made some adjustments for him.  He has a blood pressure machine at home that he can used to check his blood pressure.  Hopefully, his renal function will improve with adjustments with his blood pressure medication.  Again I cannot find anything in the oral cavity that looks like an infection.  He is not on any bisphosphonate that would cause problems.  We will see what his monoclonal studies look like.  Hopefully, we will see that everything is still holding stable.  We will still plan to get him back in 1 month.     [name], MD 2/14/202511:15 AM

## 2023-02-25 NOTE — Patient Instructions (Signed)
[facility] - A DEPT OF [facility]  Discharge Instructions: Thank you for choosing [facility] to provide your oncology and hematology care.   If you have a lab appointment with the Cancer Center, please go directly to the Cancer Center and check in at the registration area.  Wear comfortable clothing and clothing appropriate for easy access to any Portacath or PICC line.   We strive to give you quality time with your provider. You may need to reschedule your appointment if you arrive late (15 or more minutes).  Arriving late affects you and other patients whose appointments are after yours.  Also, if you miss three or more appointments without notifying the office, you may be dismissed from the clinic at the provider's discretion.      For prescription refill requests, have your pharmacy contact our office and allow 72 hours for refills to be completed.    Today you received the following chemotherapy and/or immunotherapy agents Darzalex.      To help prevent nausea and vomiting after your treatment, we encourage you to take your nausea medication as directed.  BELOW ARE SYMPTOMS THAT SHOULD BE REPORTED IMMEDIATELY: *FEVER GREATER THAN 100.4 F (38 C) OR HIGHER *CHILLS OR SWEATING *NAUSEA AND VOMITING THAT IS NOT CONTROLLED WITH YOUR NAUSEA MEDICATION *UNUSUAL SHORTNESS OF BREATH *UNUSUAL BRUISING OR BLEEDING *URINARY PROBLEMS (pain or burning when urinating, or frequent urination) *BOWEL PROBLEMS (unusual diarrhea, constipation, pain near the anus) TENDERNESS IN MOUTH AND THROAT WITH OR WITHOUT PRESENCE OF ULCERS (sore throat, sores in mouth, or a toothache) UNUSUAL RASH, SWELLING OR PAIN  UNUSUAL VAGINAL DISCHARGE OR ITCHING   Items with * indicate a potential emergency and should be followed up as soon as possible or go to the Emergency Department if any problems should occur.  Please show the CHEMOTHERAPY ALERT CARD or IMMUNOTHERAPY  ALERT CARD at check-in to the Emergency Department and triage nurse. Should you have questions after your visit or need to cancel or reschedule your appointment, please contact [facility] - A DEPT OF [facility]  [phone] and follow the prompts.  Office hours are 8:00 a.m. to 4:30 p.m. Monday - Friday. Please note that voicemails left after 4:00 p.m. may not be returned until the following business day.  We are closed weekends and major holidays. You have access to a nurse at all times for urgent questions. Please call the main number to the clinic [phone] and follow the prompts.  For any non-urgent questions, you may also contact your provider using MyChart. We now offer e-Visits for anyone 65 and older to request care online for non-urgent symptoms. For details visit [email or web address].   Also download the MyChart app! Go to the app store, search "MyChart", open the app, select [facility], and log in with your MyChart username and password.

## 2023-02-26 ENCOUNTER — Other Ambulatory Visit: Payer: Self-pay

## 2023-02-27 LAB — IGG, IGA, IGM
IgA: 133 mg/dL (ref 61–437)
IgG (Immunoglobin G), Serum: 633 mg/dL (ref 603–1613)
IgM (Immunoglobulin M), Srm: 37 mg/dL (ref 15–143)

## 2023-02-28 LAB — PROTEIN ELECTROPHORESIS, SERUM, WITH REFLEX
A/G Ratio: 1.6 (ref 0.7–1.7)
Albumin ELP: 3.8 g/dL (ref 2.9–4.4)
Alpha-1-Globulin: 0.2 g/dL (ref 0.0–0.4)
Alpha-2-Globulin: 0.8 g/dL (ref 0.4–1.0)
Beta Globulin: 0.9 g/dL (ref 0.7–1.3)
Gamma Globulin: 0.5 g/dL (ref 0.4–1.8)
Globulin, Total: 2.4 g/dL (ref 2.2–3.9)
Total Protein ELP: 6.2 g/dL (ref 6.0–8.5)

## 2023-02-28 LAB — KAPPA/LAMBDA LIGHT CHAINS
Kappa free light chain: 37.7 mg/L — ABNORMAL HIGH (ref 3.3–19.4)
Kappa, lambda light chain ratio: 2.51 — ABNORMAL HIGH (ref 0.26–1.65)
Lambda free light chains: 15 mg/L (ref 5.7–26.3)

## 2023-03-25 ENCOUNTER — Encounter: Payer: Self-pay | Admitting: Hematology & Oncology

## 2023-03-25 ENCOUNTER — Inpatient Hospital Stay: Payer: Medicare Other | Attending: Hematology & Oncology

## 2023-03-25 ENCOUNTER — Inpatient Hospital Stay: Payer: Medicare Other

## 2023-03-25 ENCOUNTER — Inpatient Hospital Stay: Payer: Medicare Other | Admitting: Hematology & Oncology

## 2023-03-25 VITALS — BP 116/53 | HR 57 | Temp 97.8°F | Resp 20 | Ht 73.0 in | Wt 166.0 lb

## 2023-03-25 DIAGNOSIS — C9000 Multiple myeloma not having achieved remission: Secondary | ICD-10-CM | POA: Diagnosis present

## 2023-03-25 DIAGNOSIS — D509 Iron deficiency anemia, unspecified: Secondary | ICD-10-CM | POA: Insufficient documentation

## 2023-03-25 DIAGNOSIS — D631 Anemia in chronic kidney disease: Secondary | ICD-10-CM | POA: Diagnosis not present

## 2023-03-25 DIAGNOSIS — D472 Monoclonal gammopathy: Secondary | ICD-10-CM

## 2023-03-25 DIAGNOSIS — R059 Cough, unspecified: Secondary | ICD-10-CM | POA: Diagnosis not present

## 2023-03-25 DIAGNOSIS — Z5112 Encounter for antineoplastic immunotherapy: Secondary | ICD-10-CM | POA: Diagnosis present

## 2023-03-25 DIAGNOSIS — Z87891 Personal history of nicotine dependence: Secondary | ICD-10-CM | POA: Diagnosis not present

## 2023-03-25 DIAGNOSIS — Z79899 Other long term (current) drug therapy: Secondary | ICD-10-CM | POA: Diagnosis not present

## 2023-03-25 DIAGNOSIS — I10 Essential (primary) hypertension: Secondary | ICD-10-CM | POA: Insufficient documentation

## 2023-03-25 DIAGNOSIS — N289 Disorder of kidney and ureter, unspecified: Secondary | ICD-10-CM | POA: Insufficient documentation

## 2023-03-25 HISTORY — DX: Anemia in chronic kidney disease: D63.1

## 2023-03-25 LAB — CMP (CANCER CENTER ONLY)
ALT: 9 U/L (ref 0–44)
AST: 10 U/L — ABNORMAL LOW (ref 15–41)
Albumin: 3.6 g/dL (ref 3.5–5.0)
Alkaline Phosphatase: 100 U/L (ref 38–126)
Anion gap: 8 (ref 5–15)
BUN: 20 mg/dL (ref 8–23)
CO2: 29 mmol/L (ref 22–32)
Calcium: 8.5 mg/dL — ABNORMAL LOW (ref 8.9–10.3)
Chloride: 106 mmol/L (ref 98–111)
Creatinine: 1.48 mg/dL — ABNORMAL HIGH (ref 0.61–1.24)
GFR, Estimated: 46 mL/min — ABNORMAL LOW (ref >60)
Glucose, Bld: 132 mg/dL — ABNORMAL HIGH (ref 70–99)
Potassium: 4.4 mmol/L (ref 3.5–5.1)
Sodium: 143 mmol/L (ref 135–145)
Total Bilirubin: 0.4 mg/dL (ref 0.0–1.2)
Total Protein: 6 g/dL — ABNORMAL LOW (ref 6.5–8.1)

## 2023-03-25 LAB — CBC WITH DIFFERENTIAL (CANCER CENTER ONLY)
Abs Immature Granulocytes: 0.03 10*3/uL (ref 0.00–0.07)
Basophils Absolute: 0.1 10*3/uL (ref 0.0–0.1)
Basophils Relative: 1 %
Eosinophils Absolute: 0.9 10*3/uL — ABNORMAL HIGH (ref 0.0–0.5)
Eosinophils Relative: 12 %
HCT: 33.3 % — ABNORMAL LOW (ref 39.0–52.0)
Hemoglobin: 10.7 g/dL — ABNORMAL LOW (ref 13.0–17.0)
Immature Granulocytes: 0 %
Lymphocytes Relative: 20 %
Lymphs Abs: 1.5 10*3/uL (ref 0.7–4.0)
MCH: 29.1 pg (ref 26.0–34.0)
MCHC: 32.1 g/dL (ref 30.0–36.0)
MCV: 90.5 fL (ref 80.0–100.0)
Monocytes Absolute: 0.7 10*3/uL (ref 0.1–1.0)
Monocytes Relative: 9 %
Neutro Abs: 4.5 10*3/uL (ref 1.7–7.7)
Neutrophils Relative %: 58 %
Platelet Count: 273 10*3/uL (ref 150–400)
RBC: 3.68 MIL/uL — ABNORMAL LOW (ref 4.22–5.81)
RDW: 13.5 % (ref 11.5–15.5)
WBC Count: 7.7 10*3/uL (ref 4.0–10.5)
nRBC: 0 % (ref 0.0–0.2)

## 2023-03-25 LAB — LACTATE DEHYDROGENASE: LDH: 116 U/L (ref 98–192)

## 2023-03-25 MED ORDER — DARATUMUMAB-HYALURONIDASE-FIHJ 1800-30000 MG-UT/15ML ~~LOC~~ SOLN
1800.0000 mg | Freq: Once | SUBCUTANEOUS | Status: AC
  Administered 2023-03-25: 1800 mg via SUBCUTANEOUS
  Filled 2023-03-25: qty 15

## 2023-03-25 MED ORDER — DIPHENHYDRAMINE HCL 25 MG PO CAPS
50.0000 mg | ORAL_CAPSULE | Freq: Once | ORAL | Status: DC
Start: 2023-03-25 — End: 2023-03-25

## 2023-03-25 MED ORDER — ACETAMINOPHEN 325 MG PO TABS
650.0000 mg | ORAL_TABLET | Freq: Once | ORAL | Status: DC
Start: 2023-03-25 — End: 2023-03-25

## 2023-03-25 MED ORDER — DEXAMETHASONE 4 MG PO TABS: 20.0000 mg | ORAL_TABLET | Freq: Once | ORAL | Status: DC

## 2023-03-25 NOTE — Progress Notes (Signed)
 Hematology and Oncology Follow Up Visit  James Marks [id number] 05/23/1940 83 y.o. 03/25/2023   Principle Diagnosis:  IgA kappa  myeloma Iron deficiency anemia Erythropoietin deficiency anemia  Past Therapy: Velcade/Revlimid/Decadron -- s/p cycle #6 -- start on 07/27/2021   Current Therapy:        Faspro/Velcade/Revlimid/Decadron -- s/p cycle #7 - start  on 01/28/2022 --Revlimid  DC'd on 02/24/2022 - Velcade d/c on 12/01/2022 Venofer 300 mg IV given on 04/06/2023 Aranesp 300 mcg subcu q. 4 weeks.   Interim History:  James Marks is here today for follow-up and Faspro.  He is not feeling well at all.  Again, I am not sure as to why he is not feeling well.  When we last saw him, he had some adjustment with his blood pressure medications.  His kidney function got a little bit better which was nice to see.  However, he just feels tired.  His wife says he sleeps a lot.  I noted that his hemoglobin was a little bit lower.  This could certainly be a problem.  His iron study is marginal.  His iron saturation is 18%.  He does have a low erythropoietin level.  Does have renal insufficiency so is possible he may benefit from ESA.  His myeloma studies have been slowly trending upward.  When we last saw him, there was absolutely no monoclonal spike in his blood.  His IgA level was 133 mg/dL.  However, the Kappa light chain was 3.8 mg/dL.  This has been trending upward slowly.  He has had no fever.  He stopped smoking 14 days ago.  He has a low bit of a cough.  His wife James Marks of a cold recently.  Overall, his appetite is somewhat down.  He has had no nausea or vomiting.  Overall, I would say performance status is probably ECOG 2.      Medications:  Allergies as of 03/25/2023   No Known Allergies      Medication List        Accurate as of March 25, 2023 11:38 AM. If you have any questions, ask your nurse or doctor.          STOP taking these medications    chlorthalidone 25 MG  tablet Commonly known as: HYGROTON Stopped by: [name]   metroNIDAZOLE 500 MG tablet Commonly known as: FLAGYL Stopped by: [name]       TAKE these medications    acetaminophen 325 MG tablet Commonly known as: TYLENOL Take 325 mg by mouth. 02/10/2022 Takes 325 mg one hour prior to antibody treatment.   acyclovir 400 MG tablet Commonly known as: ZOVIRAX TAKE 1 TABLET(400 MG) BY MOUTH TWICE DAILY   amLODipine 5 MG tablet Commonly known as: NORVASC Take 1 tablet (5 mg total) by mouth daily.   amoxicillin 500 MG capsule Commonly known as: AMOXIL Take four tablets (2000 mg) total one hour prior to dental procedure.   BABY ASPIRIN PO Take 81 mg by mouth daily.   dexamethasone 4 MG tablet Commonly known as: DECADRON TAKE 5 TABLETS BY MOUTH 1 HOUR PRIOR TO CHEMO TREATMENT   diphenhydrAMINE 25 mg capsule Commonly known as: BENADRYL Take 50 mg by mouth. 02/10/2022 Takes one hour prior to antibody treatment.   gabapentin 300 MG capsule Commonly known as: NEURONTIN TAKE 1 CAPSULE BY MOUTH EVERY DAY WITH BREAKFAST, 1 CAPSULE WITH DINNER AND 2 CAPSULES AT BEDTIME   losartan 100 MG tablet Commonly known as: COZAAR  Take 1 tablet (100 mg total) by mouth daily. What changed: how much to take   montelukast 10 MG tablet Commonly known as: Singulair Take 1 tablet (10 mg total) by mouth at bedtime.   ondansetron 8 MG tablet Commonly known as: Zofran Take 1 tablet (8 mg total) by mouth every 8 (eight) hours as needed for nausea or vomiting.   PRESERVISION AREDS 2 PO Take by mouth daily at 6 (six) AM.   prochlorperazine 10 MG tablet Commonly known as: COMPAZINE Take 1 tablet (10 mg total) by mouth every 6 (six) hours as needed for nausea or vomiting.   traZODone 50 MG tablet Commonly known as: DESYREL Take 1 tablet (50 mg total) by mouth at bedtime.        Allergies: No Known Allergies  Past Medical History, Surgical history, Social history, and  Family History were reviewed and updated.  Review of Systems:  Review of Systems  Constitutional:  Positive for malaise/fatigue and weight loss.  HENT: Negative.    Eyes: Negative.   Respiratory: Negative.    Cardiovascular: Negative.   Gastrointestinal: Negative.   Genitourinary: Negative.   Musculoskeletal: Negative.   Skin: Negative.   Neurological: Negative.   Endo/Heme/Allergies: Negative.   Psychiatric/Behavioral: Negative.        Physical Exam:  Vital signs show temperature of 97.8.  Pulse 57.  Blood pressure 116/53.  Weight is 166 pounds.     Wt Readings from Last 3 Encounters:  03/25/23 166 lb (75.3 kg)  02/25/23 168 lb (76.2 kg)  02/21/23 168 lb 1.6 oz (76.2 kg)    Physical Exam Vitals reviewed.  HENT:     Head: Normocephalic and atraumatic.  Eyes:     Pupils: Pupils are equal, round, and reactive to light.  Cardiovascular:     Rate and Rhythm: Normal rate and regular rhythm.     Heart sounds: Normal heart sounds.  Pulmonary:     Effort: Pulmonary effort is normal.     Breath sounds: Normal breath sounds.  Abdominal:     General: Bowel sounds are normal.     Palpations: Abdomen is soft.  Musculoskeletal:        General: No tenderness or deformity. Normal range of motion.     Cervical back: Normal range of motion.  Lymphadenopathy:     Cervical: No cervical adenopathy.  Skin:    General: Skin is warm and dry.     Findings: No erythema or rash.  Neurological:     Mental Status: He is alert and oriented to person, place, and time.  Psychiatric:        Behavior: Behavior normal.        Thought Content: Thought content normal.        Judgment: Judgment normal.     Lab Results  Component Value Date   WBC 7.7 03/25/2023   HGB 10.7 (L) 03/25/2023   HCT 33.3 (L) 03/25/2023   MCV 90.5 03/25/2023   PLT 273 03/25/2023   Lab Results  Component Value Date   FERRITIN 79 12/16/2021   IRON 57 03/25/2022   TIBC 309 03/25/2022   UIBC 252 03/25/2022    IRONPCTSAT 18 03/25/2022   Lab Results  Component Value Date   RETICCTPCT 1.0 12/02/2021   RBC 3.68 (L) 03/25/2023   Lab Results  Component Value Date   KPAFRELGTCHN 37.7 (H) 02/25/2023   LAMBDASER 15.0 02/25/2023   KAPLAMBRATIO 2.51 (H) 02/25/2023   Lab Results  Component Value Date  IGGSERUM 633 02/25/2023   IGA 133 02/25/2023   IGMSERUM 37 02/25/2023   Lab Results  Component Value Date   TOTALPROTELP 6.2 02/25/2023   ALBUMINELP 3.8 02/25/2023   A1GS 0.2 02/25/2023   A2GS 0.8 02/25/2023   BETS 0.9 02/25/2023   BETA2SER 0.9 (H) 12/27/2014   GAMS 0.5 02/25/2023   MSPIKE Not Observed 02/25/2023   SPEI Comment 01/28/2023     Chemistry      Component Value Date/Time   NA 143 03/25/2023 1002   NA 143 12/06/2016 1156   NA 139 08/02/2016 1146   K 4.4 03/25/2023 1002   K 4.2 12/06/2016 1156   K 4.7 08/02/2016 1146   CL 106 03/25/2023 1002   CL 107 12/06/2016 1156   CO2 29 03/25/2023 1002   CO2 28 12/06/2016 1156   CO2 24 08/02/2016 1146   BUN 20 03/25/2023 1002   BUN 29 (H) 12/06/2016 1156   BUN 34.3 (H) 08/02/2016 1146   CREATININE 1.48 (H) 03/25/2023 1002   CREATININE 2.1 (H) 12/06/2016 1156   CREATININE 1.7 (H) 08/02/2016 1146      Component Value Date/Time   CALCIUM 8.5 (L) 03/25/2023 1002   CALCIUM 9.1 12/06/2016 1156   CALCIUM 9.5 08/02/2016 1146   ALKPHOS 100 03/25/2023 1002   ALKPHOS 108 (H) 12/06/2016 1156   ALKPHOS 115 08/02/2016 1146   AST 10 (L) 03/25/2023 1002   AST 14 08/02/2016 1146   ALT 9 03/25/2023 1002   ALT 18 12/06/2016 1156   ALT 9 08/02/2016 1146   BILITOT 0.4 03/25/2023 1002   BILITOT 0.41 08/02/2016 1146       Impression and Plan: Mr. Lebeau is 83 year old man with IgA kappa myeloma.  Currently, he is going on Faspro.  He really has done incredibly well.  However, we will have to see what his light chains look like.  I will see if maybe some iron will help him.  I do not know if this would but is certainly worthwhile  trying.  We also have to think about maybe some ESA for him.  We will go ahead with his treatment today.  I really do not think that Agustin Cree is a problem.  Will see about doing iron next week.  Hopefully, we will be able to give him Monoferric.  However, given that he is on Medicare, I doubt that we will.  I would like to get him back in a month.   [name], MD 3/14/202511:38 AM

## 2023-03-26 LAB — IGG, IGA, IGM
IgA: 144 mg/dL (ref 61–437)
IgG (Immunoglobin G), Serum: 530 mg/dL — ABNORMAL LOW (ref 603–1613)
IgM (Immunoglobulin M), Srm: 34 mg/dL (ref 15–143)

## 2023-03-27 ENCOUNTER — Other Ambulatory Visit: Payer: Self-pay

## 2023-03-28 LAB — PROTEIN ELECTROPHORESIS, SERUM, WITH REFLEX
A/G Ratio: 1.2 (ref 0.7–1.7)
Albumin ELP: 2.9 g/dL (ref 2.9–4.4)
Alpha-1-Globulin: 0.3 g/dL (ref 0.0–0.4)
Alpha-2-Globulin: 0.9 g/dL (ref 0.4–1.0)
Beta Globulin: 0.8 g/dL (ref 0.7–1.3)
Gamma Globulin: 0.5 g/dL (ref 0.4–1.8)
Globulin, Total: 2.5 g/dL (ref 2.2–3.9)
Total Protein ELP: 5.4 g/dL — ABNORMAL LOW (ref 6.0–8.5)

## 2023-03-28 LAB — KAPPA/LAMBDA LIGHT CHAINS
Kappa free light chain: 45.1 mg/L — ABNORMAL HIGH (ref 3.3–19.4)
Kappa, lambda light chain ratio: 2.68 — ABNORMAL HIGH (ref 0.26–1.65)
Lambda free light chains: 16.8 mg/L (ref 5.7–26.3)

## 2023-03-29 ENCOUNTER — Encounter: Payer: Self-pay | Admitting: *Deleted

## 2023-03-29 ENCOUNTER — Inpatient Hospital Stay

## 2023-03-29 VITALS — BP 130/66 | HR 51 | Temp 97.5°F | Resp 18

## 2023-03-29 DIAGNOSIS — Z5112 Encounter for antineoplastic immunotherapy: Secondary | ICD-10-CM | POA: Diagnosis not present

## 2023-03-29 DIAGNOSIS — D508 Other iron deficiency anemias: Secondary | ICD-10-CM

## 2023-03-29 MED ORDER — SODIUM CHLORIDE 0.9 % IV SOLN: INTRAVENOUS | Status: DC

## 2023-03-29 MED ORDER — SODIUM CHLORIDE 0.9 % IV SOLN
300.0000 mg | Freq: Once | INTRAVENOUS | Status: AC
  Administered 2023-03-29: 300 mg via INTRAVENOUS
  Filled 2023-03-29: qty 300

## 2023-03-29 NOTE — Patient Instructions (Signed)

## 2023-04-05 ENCOUNTER — Inpatient Hospital Stay

## 2023-04-05 VITALS — BP 124/51 | HR 55 | Temp 97.5°F | Resp 17

## 2023-04-05 DIAGNOSIS — D508 Other iron deficiency anemias: Secondary | ICD-10-CM

## 2023-04-05 DIAGNOSIS — Z5112 Encounter for antineoplastic immunotherapy: Secondary | ICD-10-CM | POA: Diagnosis not present

## 2023-04-05 MED ORDER — SODIUM CHLORIDE 0.9 % IV SOLN: INTRAVENOUS | Status: DC

## 2023-04-05 MED ORDER — SODIUM CHLORIDE 0.9 % IV SOLN
300.0000 mg | Freq: Once | INTRAVENOUS | Status: AC
  Administered 2023-04-05: 300 mg via INTRAVENOUS
  Filled 2023-04-05: qty 300

## 2023-04-12 ENCOUNTER — Inpatient Hospital Stay: Attending: Hematology & Oncology

## 2023-04-12 VITALS — BP 139/63 | HR 60 | Temp 97.7°F | Resp 18

## 2023-04-12 DIAGNOSIS — R634 Abnormal weight loss: Secondary | ICD-10-CM | POA: Insufficient documentation

## 2023-04-12 DIAGNOSIS — D509 Iron deficiency anemia, unspecified: Secondary | ICD-10-CM | POA: Diagnosis not present

## 2023-04-12 DIAGNOSIS — Z87891 Personal history of nicotine dependence: Secondary | ICD-10-CM | POA: Diagnosis not present

## 2023-04-12 DIAGNOSIS — C9000 Multiple myeloma not having achieved remission: Secondary | ICD-10-CM | POA: Insufficient documentation

## 2023-04-12 DIAGNOSIS — Z5112 Encounter for antineoplastic immunotherapy: Secondary | ICD-10-CM | POA: Diagnosis present

## 2023-04-12 DIAGNOSIS — R5383 Other fatigue: Secondary | ICD-10-CM | POA: Insufficient documentation

## 2023-04-12 DIAGNOSIS — D508 Other iron deficiency anemias: Secondary | ICD-10-CM

## 2023-04-12 MED ORDER — SODIUM CHLORIDE 0.9 % IV SOLN
300.0000 mg | Freq: Once | INTRAVENOUS | Status: AC
  Administered 2023-04-12: 300 mg via INTRAVENOUS
  Filled 2023-04-12: qty 300

## 2023-04-12 MED ORDER — SODIUM CHLORIDE 0.9 % IV SOLN: INTRAVENOUS | Status: DC

## 2023-04-12 NOTE — Patient Instructions (Signed)

## 2023-04-14 ENCOUNTER — Telehealth: Payer: Self-pay | Admitting: Dietician

## 2023-04-14 ENCOUNTER — Other Ambulatory Visit: Payer: Self-pay

## 2023-04-14 NOTE — Telephone Encounter (Signed)
 Patient screened on MST. First attempt to reach. Provided my cell# on voice mail to return call to set up a nutrition consult.  Gennaro Africa, RDN, LDN Registered Dietitian, [facility] Part Time Remote (Usual office hours: Tuesday-Thursday) Cell: [phone]

## 2023-04-19 ENCOUNTER — Other Ambulatory Visit: Payer: Self-pay

## 2023-04-19 ENCOUNTER — Inpatient Hospital Stay

## 2023-04-19 ENCOUNTER — Encounter: Payer: Self-pay | Admitting: Hematology & Oncology

## 2023-04-19 ENCOUNTER — Inpatient Hospital Stay (HOSPITAL_BASED_OUTPATIENT_CLINIC_OR_DEPARTMENT_OTHER): Admitting: Hematology & Oncology

## 2023-04-19 VITALS — BP 130/63 | HR 49 | Temp 97.8°F | Resp 18 | Ht 73.0 in | Wt 165.0 lb

## 2023-04-19 DIAGNOSIS — D631 Anemia in chronic kidney disease: Secondary | ICD-10-CM

## 2023-04-19 DIAGNOSIS — D472 Monoclonal gammopathy: Secondary | ICD-10-CM

## 2023-04-19 DIAGNOSIS — Z5112 Encounter for antineoplastic immunotherapy: Secondary | ICD-10-CM | POA: Diagnosis not present

## 2023-04-19 LAB — LACTATE DEHYDROGENASE: LDH: 116 U/L (ref 98–192)

## 2023-04-19 LAB — IRON AND IRON BINDING CAPACITY (CC-WL,HP ONLY)
Iron: 78 ug/dL (ref 45–182)
Saturation Ratios: 30 % (ref 17.9–39.5)
TIBC: 260 ug/dL (ref 250–450)
UIBC: 182 ug/dL (ref 117–376)

## 2023-04-19 LAB — CMP (CANCER CENTER ONLY)
ALT: 7 U/L (ref 0–44)
AST: 8 U/L — ABNORMAL LOW (ref 15–41)
Albumin: 3.9 g/dL (ref 3.5–5.0)
Alkaline Phosphatase: 113 U/L (ref 38–126)
Anion gap: 9 (ref 5–15)
BUN: 24 mg/dL — ABNORMAL HIGH (ref 8–23)
CO2: 27 mmol/L (ref 22–32)
Calcium: 8.7 mg/dL — ABNORMAL LOW (ref 8.9–10.3)
Chloride: 106 mmol/L (ref 98–111)
Creatinine: 1.39 mg/dL — ABNORMAL HIGH (ref 0.61–1.24)
GFR, Estimated: 50 mL/min — ABNORMAL LOW (ref >60)
Glucose, Bld: 146 mg/dL — ABNORMAL HIGH (ref 70–99)
Potassium: 4.3 mmol/L (ref 3.5–5.1)
Sodium: 142 mmol/L (ref 135–145)
Total Bilirubin: 0.4 mg/dL (ref 0.0–1.2)
Total Protein: 5.9 g/dL — ABNORMAL LOW (ref 6.5–8.1)

## 2023-04-19 LAB — CBC WITH DIFFERENTIAL (CANCER CENTER ONLY)
Abs Immature Granulocytes: 0.02 10*3/uL (ref 0.00–0.07)
Basophils Absolute: 0 10*3/uL (ref 0.0–0.1)
Basophils Relative: 1 %
Eosinophils Absolute: 0.6 10*3/uL — ABNORMAL HIGH (ref 0.0–0.5)
Eosinophils Relative: 10 %
HCT: 34.8 % — ABNORMAL LOW (ref 39.0–52.0)
Hemoglobin: 11.2 g/dL — ABNORMAL LOW (ref 13.0–17.0)
Immature Granulocytes: 0 %
Lymphocytes Relative: 22 %
Lymphs Abs: 1.5 10*3/uL (ref 0.7–4.0)
MCH: 29.2 pg (ref 26.0–34.0)
MCHC: 32.2 g/dL (ref 30.0–36.0)
MCV: 90.9 fL (ref 80.0–100.0)
Monocytes Absolute: 0.4 10*3/uL (ref 0.1–1.0)
Monocytes Relative: 7 %
Neutro Abs: 3.9 10*3/uL (ref 1.7–7.7)
Neutrophils Relative %: 60 %
Platelet Count: 218 10*3/uL (ref 150–400)
RBC: 3.83 MIL/uL — ABNORMAL LOW (ref 4.22–5.81)
RDW: 14.8 % (ref 11.5–15.5)
WBC Count: 6.5 10*3/uL (ref 4.0–10.5)
nRBC: 0 % (ref 0.0–0.2)

## 2023-04-19 LAB — RETICULOCYTES
Immature Retic Fract: 6.4 % (ref 2.3–15.9)
RBC.: 3.87 MIL/uL — ABNORMAL LOW (ref 4.22–5.81)
Retic Count, Absolute: 49.1 10*3/uL (ref 19.0–186.0)
Retic Ct Pct: 1.3 % (ref 0.4–3.1)

## 2023-04-19 LAB — FERRITIN: Ferritin: 363 ng/mL — ABNORMAL HIGH (ref 24–336)

## 2023-04-19 MED ORDER — ACETAMINOPHEN 325 MG PO TABS
650.0000 mg | ORAL_TABLET | Freq: Once | ORAL | Status: DC
Start: 2023-04-19 — End: 2023-04-19

## 2023-04-19 MED ORDER — DEXAMETHASONE 4 MG PO TABS: 20.0000 mg | ORAL_TABLET | Freq: Once | ORAL | Status: DC

## 2023-04-19 MED ORDER — DARATUMUMAB-HYALURONIDASE-FIHJ 1800-30000 MG-UT/15ML ~~LOC~~ SOLN
1800.0000 mg | Freq: Once | SUBCUTANEOUS | Status: AC
  Administered 2023-04-19: 1800 mg via SUBCUTANEOUS
  Filled 2023-04-19: qty 15

## 2023-04-19 MED ORDER — DIPHENHYDRAMINE HCL 25 MG PO CAPS: 50.0000 mg | ORAL_CAPSULE | Freq: Once | ORAL | Status: DC

## 2023-04-19 NOTE — Progress Notes (Signed)
 Hematology and Oncology Follow Up Visit  BUEFORD ARP [id number] 23-Feb-1940 83 y.o. 04/19/2023   Principle Diagnosis:  IgA kappa  myeloma Iron deficiency anemia Erythropoietin deficiency anemia  Past Therapy: Velcade/Revlimid/Decadron -- s/p cycle #6 -- start on 07/27/2021   Current Therapy:        Faspro/Velcade/Revlimid/Decadron -- s/p cycle #7 - start  on 01/28/2022 --Revlimid  DC'd on 02/24/2022 - Velcade d/c on 12/01/2022 Venofer 300 mg IV given on 04/06/2023 Aranesp 300 mcg subcu q. 4 weeks.   Interim History:  Mr. Hellmann is here today for follow-up and Faspro.  He might be doing a little bit better.  When we did have more extensive workup with the fatigue, we found that he was iron deficient.  We also found that he was erythropoietin deficient..  He has gotten IV iron.  I think that he received his last dose on 04/12/2023.  His hemoglobin is better today.  He does not need any Aranesp.  As far as the myeloma is concerned, this really has not been a problem from my point of view.  When we last checked his myeloma studies, there was no monoclonal spike in his blood.  His IgA level was 144 mg/dL.  The Kappa light chain was 4.5 mg/dL.  He has not smoked now for over a month.  Am so happy that he did stop smoking.  His appetite is doing okay.  He has had no problems with nausea or vomiting.  He has had no change in bowel or bladder habits.  There is been no leg swelling.  Overall, I would say that his performance status is probably ECOG 1.     Medications:  Allergies as of 04/19/2023   No Known Allergies      Medication List        Accurate as of April 19, 2023  1:27 PM. If you have any questions, ask your nurse or doctor.          acetaminophen 325 MG tablet Commonly known as: TYLENOL Take 325 mg by mouth. 02/10/2022 Takes 325 mg one hour prior to antibody treatment.   acyclovir 400 MG tablet Commonly known as: ZOVIRAX TAKE 1 TABLET(400 MG) BY MOUTH TWICE  DAILY   amLODipine 5 MG tablet Commonly known as: NORVASC Take 1 tablet (5 mg total) by mouth daily.   amoxicillin 500 MG capsule Commonly known as: AMOXIL Take four tablets (2000 mg) total one hour prior to dental procedure.   BABY ASPIRIN PO Take 81 mg by mouth daily.   dexamethasone 4 MG tablet Commonly known as: DECADRON TAKE 5 TABLETS BY MOUTH 1 HOUR PRIOR TO CHEMO TREATMENT   diphenhydrAMINE 25 mg capsule Commonly known as: BENADRYL Take 50 mg by mouth. 02/10/2022 Takes one hour prior to antibody treatment.   gabapentin 300 MG capsule Commonly known as: NEURONTIN TAKE 1 CAPSULE BY MOUTH EVERY DAY WITH BREAKFAST, 1 CAPSULE WITH DINNER AND 2 CAPSULES AT BEDTIME   losartan 50 MG tablet Commonly known as: COZAAR Take 50 mg by mouth daily. What changed: Another medication with the same name was removed. Continue taking this medication, and follow the directions you see here. Changed by: [name]   montelukast 10 MG tablet Commonly known as: Singulair Take 1 tablet (10 mg total) by mouth at bedtime.   ondansetron 8 MG tablet Commonly known as: Zofran Take 1 tablet (8 mg total) by mouth every 8 (eight) hours as needed for nausea or vomiting.   PRESERVISION  AREDS 2 PO Take by mouth daily at 6 (six) AM.   prochlorperazine 10 MG tablet Commonly known as: COMPAZINE Take 1 tablet (10 mg total) by mouth every 6 (six) hours as needed for nausea or vomiting.   traZODone 50 MG tablet Commonly known as: DESYREL Take 1 tablet (50 mg total) by mouth at bedtime.        Allergies: No Known Allergies  Past Medical History, Surgical history, Social history, and Family History were reviewed and updated.  Review of Systems:  Review of Systems  Constitutional:  Positive for malaise/fatigue and weight loss.  HENT: Negative.    Eyes: Negative.   Respiratory: Negative.    Cardiovascular: Negative.   Gastrointestinal: Negative.   Genitourinary: Negative.    Musculoskeletal: Negative.   Skin: Negative.   Neurological: Negative.   Endo/Heme/Allergies: Negative.   Psychiatric/Behavioral: Negative.        Physical Exam:  Vital signs show temperature of 97.8.  Pulse 49.  Blood pressure 130/63.  Weight is 165 pounds.     Wt Readings from Last 3 Encounters:  04/19/23 165 lb (74.8 kg)  03/25/23 166 lb (75.3 kg)  02/25/23 168 lb (76.2 kg)    Physical Exam Vitals reviewed.  HENT:     Head: Normocephalic and atraumatic.  Eyes:     Pupils: Pupils are equal, round, and reactive to light.  Cardiovascular:     Rate and Rhythm: Normal rate and regular rhythm.     Heart sounds: Normal heart sounds.  Pulmonary:     Effort: Pulmonary effort is normal.     Breath sounds: Normal breath sounds.  Abdominal:     General: Bowel sounds are normal.     Palpations: Abdomen is soft.  Musculoskeletal:        General: No tenderness or deformity. Normal range of motion.     Cervical back: Normal range of motion.  Lymphadenopathy:     Cervical: No cervical adenopathy.  Skin:    General: Skin is warm and dry.     Findings: No erythema or rash.  Neurological:     Mental Status: He is alert and oriented to person, place, and time.  Psychiatric:        Behavior: Behavior normal.        Thought Content: Thought content normal.        Judgment: Judgment normal.      Lab Results  Component Value Date   WBC 6.5 04/19/2023   HGB 11.2 (L) 04/19/2023   HCT 34.8 (L) 04/19/2023   MCV 90.9 04/19/2023   PLT 218 04/19/2023   Lab Results  Component Value Date   FERRITIN 79 12/16/2021   IRON 57 03/25/2022   TIBC 309 03/25/2022   UIBC 252 03/25/2022   IRONPCTSAT 18 03/25/2022   Lab Results  Component Value Date   RETICCTPCT 1.3 04/19/2023   RBC 3.87 (L) 04/19/2023   Lab Results  Component Value Date   KPAFRELGTCHN 45.1 (H) 03/25/2023   LAMBDASER 16.8 03/25/2023   KAPLAMBRATIO 2.68 (H) 03/25/2023   Lab Results  Component Value Date    IGGSERUM 530 (L) 03/25/2023   IGA 144 03/25/2023   IGMSERUM 34 03/25/2023   Lab Results  Component Value Date   TOTALPROTELP 5.4 (L) 03/25/2023   ALBUMINELP 2.9 03/25/2023   A1GS 0.3 03/25/2023   A2GS 0.9 03/25/2023   BETS 0.8 03/25/2023   BETA2SER 0.9 (H) 12/27/2014   GAMS 0.5 03/25/2023   MSPIKE Not Observed 03/25/2023  SPEI Comment 01/28/2023     Chemistry      Component Value Date/Time   NA 142 04/19/2023 1210   NA 143 12/06/2016 1156   NA 139 08/02/2016 1146   K 4.3 04/19/2023 1210   K 4.2 12/06/2016 1156   K 4.7 08/02/2016 1146   CL 106 04/19/2023 1210   CL 107 12/06/2016 1156   CO2 27 04/19/2023 1210   CO2 28 12/06/2016 1156   CO2 24 08/02/2016 1146   BUN 24 (H) 04/19/2023 1210   BUN 29 (H) 12/06/2016 1156   BUN 34.3 (H) 08/02/2016 1146   CREATININE 1.39 (H) 04/19/2023 1210   CREATININE 2.1 (H) 12/06/2016 1156   CREATININE 1.7 (H) 08/02/2016 1146      Component Value Date/Time   CALCIUM 8.7 (L) 04/19/2023 1210   CALCIUM 9.1 12/06/2016 1156   CALCIUM 9.5 08/02/2016 1146   ALKPHOS 113 04/19/2023 1210   ALKPHOS 108 (H) 12/06/2016 1156   ALKPHOS 115 08/02/2016 1146   AST 8 (L) 04/19/2023 1210   AST 14 08/02/2016 1146   ALT 7 04/19/2023 1210   ALT 18 12/06/2016 1156   ALT 9 08/02/2016 1146   BILITOT 0.4 04/19/2023 1210   BILITOT 0.41 08/02/2016 1146       Impression and Plan: Mr. Roy is 83 year old man with IgA kappa myeloma.  Currently, he is going on Faspro.  He really has done incredibly well.  However, we will have to see what his light chains look like.  At this point, our goal is definitely to improve his quality of life.  If we improve his quality of life where he is not as tired and fatigued as much, then he will feel better and he will certainly do better.  We will have to see what his iron levels look like.  I would have to believe that they are better.  We will see what his myeloma tests look like.  He does not need any Aranesp  today.  For right now, we will have him come back in 1 month.   [name], MD 4/8/20251:27 PM

## 2023-04-19 NOTE — Patient Instructions (Signed)
Daratumumab Injection What is this medication? DARATUMUMAB (dar a toom ue mab) treats multiple myeloma, a type of bone marrow cancer. It works by helping your immune system slow or stop the spread of cancer cells. It is a monoclonal antibody. This medicine may be used for other purposes; ask your health care provider or pharmacist if you have questions. COMMON BRAND NAME(S): DARZALEX What should I tell my care team before I take this medication? They need to know if you have any of these conditions: Hereditary fructose intolerance Infection, such as chickenpox, herpes, hepatitis B Lung or breathing disease, such as asthma, COPD An unusual or allergic reaction to daratumumab, sorbitol, other medications, foods, dyes, or preservatives Pregnant or trying to get pregnant Breastfeeding How should I use this medication? This medication is injected into a vein. It is given by your care team in a hospital or clinic setting. Talk to your care team about the use of this medication in children. Special care may be needed. Overdosage: If you think you have taken too much of this medicine contact a poison control center or emergency room at once. NOTE: This medicine is only for you. Do not share this medicine with others. What if I miss a dose? Keep appointments for follow-up doses. It is important not to miss your dose. Call your care team if you are unable to keep an appointment. What may interact with this medication? Interactions have not been studied. This list may not describe all possible interactions. Give your health care provider a list of all the medicines, herbs, non-prescription drugs, or dietary supplements you use. Also tell them if you smoke, drink alcohol, or use illegal drugs. Some items may interact with your medicine. What should I watch for while using this medication? Your condition will be monitored carefully while you are receiving this medication. This medication can cause  serious allergic reactions. To reduce your risk, your care team may give you other medication to take before receiving this one. Be sure to follow the directions from your care team. This medication can affect the results of blood tests to match your blood type. These changes can last for up to 6 months after the final dose. Your care team will do blood tests to match your blood type before you start treatment. Tell all of your care team that you are being treated with this medication before receiving a blood transfusion. This medication can affect the results of some tests used to determine treatment response; extra tests may be needed to evaluate response. Talk to your care team if you wish to become pregnant or think you are pregnant. This medication can cause serious birth defects if taken during pregnancy and for 3 months after the last dose. A reliable form of contraception is recommended while taking this medication and for 3 months after the last dose. Talk to your care team about effective forms of contraception. Do not breast-feed while taking this medication. What side effects may I notice from receiving this medication? Side effects that you should report to your care team as soon as possible: Allergic reactions--skin rash, itching, hives, swelling of the face, lips, tongue, or throat Infection--fever, chills, cough, sore throat, wounds that don't heal, pain or trouble when passing urine, general feeling of discomfort or being unwell Infusion reactions--chest pain, shortness of breath or trouble breathing, feeling faint or lightheaded Unusual bruising or bleeding Side effects that usually do not require medical attention (report to your care team if they continue or   are bothersome): Constipation Diarrhea Fatigue Nausea Pain, tingling, or numbness in the hands or feet Swelling of the ankles, hands, or feet This list may not describe all possible side effects. Call your doctor for medical  advice about side effects. You may report side effects to FDA at 1-800-FDA-1088. Where should I keep my medication? This medication is given in a hospital or clinic. It will not be stored at home. NOTE: This sheet is a summary. It may not cover all possible information. If you have questions about this medicine, talk to your doctor, pharmacist, or health care provider.  2024 Elsevier/Gold Standard (2021-11-05 00:00:00)  

## 2023-04-20 ENCOUNTER — Telehealth: Payer: Self-pay

## 2023-04-20 ENCOUNTER — Other Ambulatory Visit: Payer: Self-pay

## 2023-04-20 LAB — KAPPA/LAMBDA LIGHT CHAINS
Kappa free light chain: 34.1 mg/L — ABNORMAL HIGH (ref 3.3–19.4)
Kappa, lambda light chain ratio: 2.45 — ABNORMAL HIGH (ref 0.26–1.65)
Lambda free light chains: 13.9 mg/L (ref 5.7–26.3)

## 2023-04-20 LAB — IGG, IGA, IGM
IgA: 154 mg/dL (ref 61–437)
IgG (Immunoglobin G), Serum: 575 mg/dL — ABNORMAL LOW (ref 603–1613)
IgM (Immunoglobulin M), Srm: 30 mg/dL (ref 15–143)

## 2023-04-20 NOTE — Telephone Encounter (Signed)
 Advised via MyChart.

## 2023-04-20 NOTE — Telephone Encounter (Signed)
-----   Message from [name] sent at 04/20/2023  6:30 AM EDT ----- Please call and let him know that the iron studies are okay.  Cindee Lame

## 2023-04-21 ENCOUNTER — Telehealth: Payer: Self-pay | Admitting: Dietician

## 2023-04-21 ENCOUNTER — Other Ambulatory Visit: Payer: Self-pay

## 2023-04-21 LAB — PROTEIN ELECTROPHORESIS, SERUM, WITH REFLEX
A/G Ratio: 1.5 (ref 0.7–1.7)
Albumin ELP: 3.4 g/dL (ref 2.9–4.4)
Alpha-1-Globulin: 0.2 g/dL (ref 0.0–0.4)
Alpha-2-Globulin: 0.7 g/dL (ref 0.4–1.0)
Beta Globulin: 0.9 g/dL (ref 0.7–1.3)
Gamma Globulin: 0.4 g/dL (ref 0.4–1.8)
Globulin, Total: 2.2 g/dL (ref 2.2–3.9)
Total Protein ELP: 5.6 g/dL — ABNORMAL LOW (ref 6.0–8.5)

## 2023-04-21 NOTE — Telephone Encounter (Signed)
Patient screened on MST. Second attempt to reach. Provided my cell# on voice mail to return call to set up a nutrition consult.  April Manson, RDN, LDN Registered Dietitian, [facility] Part Time Remote (Usual office hours: Tuesday-Thursday) Cell: [phone]

## 2023-05-14 ENCOUNTER — Other Ambulatory Visit: Payer: Self-pay

## 2023-05-17 ENCOUNTER — Encounter: Payer: Self-pay | Admitting: Hematology & Oncology

## 2023-05-17 ENCOUNTER — Inpatient Hospital Stay

## 2023-05-17 ENCOUNTER — Inpatient Hospital Stay (HOSPITAL_BASED_OUTPATIENT_CLINIC_OR_DEPARTMENT_OTHER): Admitting: Hematology & Oncology

## 2023-05-17 ENCOUNTER — Inpatient Hospital Stay: Attending: Hematology & Oncology

## 2023-05-17 ENCOUNTER — Other Ambulatory Visit: Payer: Self-pay

## 2023-05-17 VITALS — BP 148/60 | HR 51 | Resp 17

## 2023-05-17 VITALS — BP 122/52 | HR 53 | Temp 97.7°F | Resp 16 | Ht 73.0 in | Wt 167.0 lb

## 2023-05-17 DIAGNOSIS — R5383 Other fatigue: Secondary | ICD-10-CM | POA: Diagnosis not present

## 2023-05-17 DIAGNOSIS — D472 Monoclonal gammopathy: Secondary | ICD-10-CM

## 2023-05-17 DIAGNOSIS — Z5112 Encounter for antineoplastic immunotherapy: Secondary | ICD-10-CM | POA: Diagnosis present

## 2023-05-17 DIAGNOSIS — R634 Abnormal weight loss: Secondary | ICD-10-CM | POA: Insufficient documentation

## 2023-05-17 DIAGNOSIS — C9000 Multiple myeloma not having achieved remission: Secondary | ICD-10-CM | POA: Diagnosis present

## 2023-05-17 DIAGNOSIS — Z87891 Personal history of nicotine dependence: Secondary | ICD-10-CM | POA: Insufficient documentation

## 2023-05-17 DIAGNOSIS — D509 Iron deficiency anemia, unspecified: Secondary | ICD-10-CM | POA: Diagnosis not present

## 2023-05-17 LAB — IRON AND IRON BINDING CAPACITY (CC-WL,HP ONLY)
Iron: 86 ug/dL (ref 45–182)
Saturation Ratios: 30 % (ref 17.9–39.5)
TIBC: 283 ug/dL (ref 250–450)
UIBC: 197 ug/dL (ref 117–376)

## 2023-05-17 LAB — CBC WITH DIFFERENTIAL (CANCER CENTER ONLY)
Abs Immature Granulocytes: 0.01 10*3/uL (ref 0.00–0.07)
Basophils Absolute: 0.1 10*3/uL (ref 0.0–0.1)
Basophils Relative: 1 %
Eosinophils Absolute: 0.2 10*3/uL (ref 0.0–0.5)
Eosinophils Relative: 4 %
HCT: 39.6 % (ref 39.0–52.0)
Hemoglobin: 12.6 g/dL — ABNORMAL LOW (ref 13.0–17.0)
Immature Granulocytes: 0 %
Lymphocytes Relative: 20 %
Lymphs Abs: 1.3 10*3/uL (ref 0.7–4.0)
MCH: 29.1 pg (ref 26.0–34.0)
MCHC: 31.8 g/dL (ref 30.0–36.0)
MCV: 91.5 fL (ref 80.0–100.0)
Monocytes Absolute: 0.5 10*3/uL (ref 0.1–1.0)
Monocytes Relative: 7 %
Neutro Abs: 4.6 10*3/uL (ref 1.7–7.7)
Neutrophils Relative %: 68 %
Platelet Count: 180 10*3/uL (ref 150–400)
RBC: 4.33 MIL/uL (ref 4.22–5.81)
RDW: 14.2 % (ref 11.5–15.5)
WBC Count: 6.8 10*3/uL (ref 4.0–10.5)
nRBC: 0 % (ref 0.0–0.2)

## 2023-05-17 LAB — CMP (CANCER CENTER ONLY)
ALT: 8 U/L (ref 0–44)
AST: 11 U/L — ABNORMAL LOW (ref 15–41)
Albumin: 4.4 g/dL (ref 3.5–5.0)
Alkaline Phosphatase: 105 U/L (ref 38–126)
Anion gap: 7 (ref 5–15)
BUN: 29 mg/dL — ABNORMAL HIGH (ref 8–23)
CO2: 29 mmol/L (ref 22–32)
Calcium: 9.2 mg/dL (ref 8.9–10.3)
Chloride: 107 mmol/L (ref 98–111)
Creatinine: 1.53 mg/dL — ABNORMAL HIGH (ref 0.61–1.24)
GFR, Estimated: 45 mL/min — ABNORMAL LOW (ref >60)
Glucose, Bld: 130 mg/dL — ABNORMAL HIGH (ref 70–99)
Potassium: 4.4 mmol/L (ref 3.5–5.1)
Sodium: 143 mmol/L (ref 135–145)
Total Bilirubin: 0.4 mg/dL (ref 0.0–1.2)
Total Protein: 6.5 g/dL (ref 6.5–8.1)

## 2023-05-17 LAB — FERRITIN: Ferritin: 334 ng/mL (ref 24–336)

## 2023-05-17 LAB — LACTATE DEHYDROGENASE: LDH: 115 U/L (ref 98–192)

## 2023-05-17 LAB — RETICULOCYTES
Immature Retic Fract: 5.9 % (ref 2.3–15.9)
RBC.: 4.34 MIL/uL (ref 4.22–5.81)
Retic Count, Absolute: 40.4 10*3/uL (ref 19.0–186.0)
Retic Ct Pct: 0.9 % (ref 0.4–3.1)

## 2023-05-17 MED ORDER — DARATUMUMAB-HYALURONIDASE-FIHJ 1800-30000 MG-UT/15ML ~~LOC~~ SOLN
1800.0000 mg | Freq: Once | SUBCUTANEOUS | Status: AC
  Administered 2023-05-17: 1800 mg via SUBCUTANEOUS
  Filled 2023-05-17: qty 15

## 2023-05-17 MED ORDER — DIPHENHYDRAMINE HCL 25 MG PO CAPS: 50.0000 mg | ORAL_CAPSULE | Freq: Once | ORAL | Status: DC

## 2023-05-17 MED ORDER — ACETAMINOPHEN 325 MG PO TABS: 650.0000 mg | ORAL_TABLET | Freq: Once | ORAL | Status: DC

## 2023-05-17 MED ORDER — DEXAMETHASONE 4 MG PO TABS: 20.0000 mg | ORAL_TABLET | Freq: Once | ORAL | Status: DC

## 2023-05-17 NOTE — Progress Notes (Signed)
 Hematology and Oncology Follow Up Visit  DREQUAN PERTILE [id number] 27-Jan-1940 83 y.o. 05/17/2023   Principle Diagnosis:  IgA kappa  myeloma Iron  deficiency anemia Erythropoietin  deficiency anemia  Past Therapy: Velcade /Revlimid /Decadron  -- s/p cycle #6 -- start on 07/27/2021   Current Therapy:        Faspro/Velcade /Revlimid /Decadron  -- s/p cycle #17 - start  on 01/28/2022 --Revlimid   DC'd on 02/24/2022 - Velcade  d/c on 12/01/2022 Venofer  300 mg IV given on 04/06/2023 Aranesp 300 mcg subcu q. 4 weeks.   Interim History:  Mr. Starn is here today for follow-up and Faspro.  He says that he feels better now.  He thinks that the iron  really has helped him.  I think the iron  really has helped him as his hemoglobin is a lot better now.  He has had no problems with bleeding.  He is more active.  Unfortunate, his wife cannot be with him today.  She will be having a colonoscopy tomorrow.  His last light chain was down a little bit.  I think the Kappa light chain was down to 3.4 mg/dL.  He has had no cough.  He said no change in bowel or bladder habits.  He has had no leg swelling.  He still does not smoking.  I am so happy about this.  Hopefully, he will go out and be able to play some golf later on this week.  He has had no fever.  Overall, I would say that his performance status is probably ECOG 1.     Medications:  Allergies as of 05/17/2023   No Known Allergies      Medication List        Accurate as of May 17, 2023 11:14 AM. If you have any questions, ask your nurse or doctor.          acetaminophen  325 MG tablet Commonly known as: TYLENOL  Take 325 mg by mouth. 02/10/2022 Takes 325 mg one hour prior to antibody treatment.   acyclovir  400 MG tablet Commonly known as: ZOVIRAX  TAKE 1 TABLET(400 MG) BY MOUTH TWICE DAILY   amLODipine  5 MG tablet Commonly known as: NORVASC  Take 1 tablet (5 mg total) by mouth daily.   amoxicillin  500 MG capsule Commonly known as:  AMOXIL  Take four tablets (2000 mg) total one hour prior to dental procedure.   BABY ASPIRIN PO Take 81 mg by mouth daily.   dexamethasone  4 MG tablet Commonly known as: DECADRON  TAKE 5 TABLETS BY MOUTH 1 HOUR PRIOR TO CHEMO TREATMENT   diphenhydrAMINE  25 mg capsule Commonly known as: BENADRYL  Take 50 mg by mouth. 02/10/2022 Takes one hour prior to antibody treatment.   gabapentin  300 MG capsule Commonly known as: NEURONTIN  TAKE 1 CAPSULE BY MOUTH EVERY DAY WITH BREAKFAST, 1 CAPSULE WITH DINNER AND 2 CAPSULES AT BEDTIME   losartan  50 MG tablet Commonly known as: COZAAR  Take 50 mg by mouth daily.   montelukast  10 MG tablet Commonly known as: Singulair  Take 1 tablet (10 mg total) by mouth at bedtime.   ondansetron  8 MG tablet Commonly known as: Zofran  Take 1 tablet (8 mg total) by mouth every 8 (eight) hours as needed for nausea or vomiting.   PRESERVISION AREDS 2 PO Take by mouth daily at 6 (six) AM.   prochlorperazine  10 MG tablet Commonly known as: COMPAZINE  Take 1 tablet (10 mg total) by mouth every 6 (six) hours as needed for nausea or vomiting.   traZODone  50 MG tablet Commonly known as: DESYREL  Take 1  tablet (50 mg total) by mouth at bedtime.        Allergies: No Known Allergies  Past Medical History, Surgical history, Social history, and Family History were reviewed and updated.  Review of Systems:  Review of Systems  Constitutional:  Positive for malaise/fatigue and weight loss.  HENT: Negative.    Eyes: Negative.   Respiratory: Negative.    Cardiovascular: Negative.   Gastrointestinal: Negative.   Genitourinary: Negative.   Musculoskeletal: Negative.   Skin: Negative.   Neurological: Negative.   Endo/Heme/Allergies: Negative.   Psychiatric/Behavioral: Negative.        Physical Exam:  Vital signs show temperature of 97.7.  Pulse 53.  Blood pressure 122/52.  Weight is 167 pounds.     Wt Readings from Last 3 Encounters:  05/17/23 167 lb  (75.8 kg)  04/19/23 165 lb (74.8 kg)  03/25/23 166 lb (75.3 kg)    Physical Exam Vitals reviewed.  HENT:     Head: Normocephalic and atraumatic.  Eyes:     Pupils: Pupils are equal, round, and reactive to light.  Cardiovascular:     Rate and Rhythm: Normal rate and regular rhythm.     Heart sounds: Normal heart sounds.  Pulmonary:     Effort: Pulmonary effort is normal.     Breath sounds: Normal breath sounds.  Abdominal:     General: Bowel sounds are normal.     Palpations: Abdomen is soft.  Musculoskeletal:        General: No tenderness or deformity. Normal range of motion.     Cervical back: Normal range of motion.  Lymphadenopathy:     Cervical: No cervical adenopathy.  Skin:    General: Skin is warm and dry.     Findings: No erythema or rash.  Neurological:     Mental Status: He is alert and oriented to person, place, and time.  Psychiatric:        Behavior: Behavior normal.        Thought Content: Thought content normal.        Judgment: Judgment normal.      Lab Results  Component Value Date   WBC 6.8 05/17/2023   HGB 12.6 (L) 05/17/2023   HCT 39.6 05/17/2023   MCV 91.5 05/17/2023   PLT 180 05/17/2023   Lab Results  Component Value Date   FERRITIN 363 (H) 04/19/2023   IRON  78 04/19/2023   TIBC 260 04/19/2023   UIBC 182 04/19/2023   IRONPCTSAT 30 04/19/2023   Lab Results  Component Value Date   RETICCTPCT 0.9 05/17/2023   RBC 4.34 05/17/2023   Lab Results  Component Value Date   KPAFRELGTCHN 34.1 (H) 04/19/2023   LAMBDASER 13.9 04/19/2023   KAPLAMBRATIO 2.45 (H) 04/19/2023   Lab Results  Component Value Date   IGGSERUM 575 (L) 04/19/2023   IGA 154 04/19/2023   IGMSERUM 30 04/19/2023   Lab Results  Component Value Date   TOTALPROTELP 5.6 (L) 04/19/2023   ALBUMINELP 3.4 04/19/2023   A1GS 0.2 04/19/2023   A2GS 0.7 04/19/2023   BETS 0.9 04/19/2023   BETA2SER 0.9 (H) 12/27/2014   GAMS 0.4 04/19/2023   MSPIKE Not Observed 04/19/2023    SPEI Comment 01/28/2023     Chemistry      Component Value Date/Time   NA 143 05/17/2023 0950   NA 143 12/06/2016 1156   NA 139 08/02/2016 1146   K 4.4 05/17/2023 0950   K 4.2 12/06/2016 1156   K 4.7 08/02/2016  1146   CL 107 05/17/2023 0950   CL 107 12/06/2016 1156   CO2 29 05/17/2023 0950   CO2 28 12/06/2016 1156   CO2 24 08/02/2016 1146   BUN 29 (H) 05/17/2023 0950   BUN 29 (H) 12/06/2016 1156   BUN 34.3 (H) 08/02/2016 1146   CREATININE 1.53 (H) 05/17/2023 0950   CREATININE 2.1 (H) 12/06/2016 1156   CREATININE 1.7 (H) 08/02/2016 1146      Component Value Date/Time   CALCIUM 9.2 05/17/2023 0950   CALCIUM 9.1 12/06/2016 1156   CALCIUM 9.5 08/02/2016 1146   ALKPHOS 105 05/17/2023 0950   ALKPHOS 108 (H) 12/06/2016 1156   ALKPHOS 115 08/02/2016 1146   AST 11 (L) 05/17/2023 0950   AST 14 08/02/2016 1146   ALT 8 05/17/2023 0950   ALT 18 12/06/2016 1156   ALT 9 08/02/2016 1146   BILITOT 0.4 05/17/2023 0950   BILITOT 0.41 08/02/2016 1146       Impression and Plan: Mr. Esse is 83 year old man with IgA kappa myeloma.  Currently, he is going on Faspro.  He really has done incredibly well.  Light chains have trended down which is nice to see.  Hopefully the Kappa light chain will continue to trend down.  More importantly his fact that his quality life is better.  His hemoglobin is doing much better.  The iron  and Aranesp truly helped.  He clearly does not need Aranesp.  Will see what his iron  levels are.  We will plan to get him back in another month.  I am just happy that he feels better and is able to do more.    Ivor Mars, MD 5/6/202511:14 AM

## 2023-05-17 NOTE — Patient Instructions (Signed)
 [facility] - A DEPT OF [facility]  Discharge Instructions: Thank you for choosing [facility] to provide your oncology and hematology care.   If you have a lab appointment with the Cancer Center, please go directly to the Cancer Center and check in at the registration area.  Wear comfortable clothing and clothing appropriate for easy access to any Portacath or PICC line.   We strive to give you quality time with your provider. You may need to reschedule your appointment if you arrive late (15 or more minutes).  Arriving late affects you and other patients whose appointments are after yours.  Also, if you miss three or more appointments without notifying the office, you may be dismissed from the clinic at the provider's discretion.      For prescription refill requests, have your pharmacy contact our office and allow 72 hours for refills to be completed.    Today you received the following chemotherapy and/or immunotherapy agents Darzalex.      To help prevent nausea and vomiting after your treatment, we encourage you to take your nausea medication as directed.  BELOW ARE SYMPTOMS THAT SHOULD BE REPORTED IMMEDIATELY: *FEVER GREATER THAN 100.4 F (38 C) OR HIGHER *CHILLS OR SWEATING *NAUSEA AND VOMITING THAT IS NOT CONTROLLED WITH YOUR NAUSEA MEDICATION *UNUSUAL SHORTNESS OF BREATH *UNUSUAL BRUISING OR BLEEDING *URINARY PROBLEMS (pain or burning when urinating, or frequent urination) *BOWEL PROBLEMS (unusual diarrhea, constipation, pain near the anus) TENDERNESS IN MOUTH AND THROAT WITH OR WITHOUT PRESENCE OF ULCERS (sore throat, sores in mouth, or a toothache) UNUSUAL RASH, SWELLING OR PAIN  UNUSUAL VAGINAL DISCHARGE OR ITCHING   Items with * indicate a potential emergency and should be followed up as soon as possible or go to the Emergency Department if any problems should occur.  Please show the CHEMOTHERAPY ALERT CARD or IMMUNOTHERAPY  ALERT CARD at check-in to the Emergency Department and triage nurse. Should you have questions after your visit or need to cancel or reschedule your appointment, please contact [facility] - A DEPT OF [facility]  [phone] and follow the prompts.  Office hours are 8:00 a.m. to 4:30 p.m. Monday - Friday. Please note that voicemails left after 4:00 p.m. may not be returned until the following business day.  We are closed weekends and major holidays. You have access to a nurse at all times for urgent questions. Please call the main number to the clinic [phone] and follow the prompts.  For any non-urgent questions, you may also contact your provider using MyChart. We now offer e-Visits for anyone 65 and older to request care online for non-urgent symptoms. For details visit [email or web address].   Also download the MyChart app! Go to the app store, search "MyChart", open the app, select [facility], and log in with your MyChart username and password.

## 2023-05-17 NOTE — Progress Notes (Signed)
 Ok to treat with Creatinine of 1.53 per Dr Maria Shiner.

## 2023-05-18 LAB — IGG, IGA, IGM
IgA: 144 mg/dL (ref 61–437)
IgG (Immunoglobin G), Serum: 596 mg/dL — ABNORMAL LOW (ref 603–1613)
IgM (Immunoglobulin M), Srm: 30 mg/dL (ref 15–143)

## 2023-05-18 LAB — KAPPA/LAMBDA LIGHT CHAINS
Kappa free light chain: 26.7 mg/L — ABNORMAL HIGH (ref 3.3–19.4)
Kappa, lambda light chain ratio: 2.57 — ABNORMAL HIGH (ref 0.26–1.65)
Lambda free light chains: 10.4 mg/L (ref 5.7–26.3)

## 2023-05-19 ENCOUNTER — Other Ambulatory Visit: Payer: Self-pay

## 2023-05-19 LAB — PROTEIN ELECTROPHORESIS, SERUM, WITH REFLEX
A/G Ratio: 1.6 (ref 0.7–1.7)
Albumin ELP: 3.6 g/dL (ref 2.9–4.4)
Alpha-1-Globulin: 0.2 g/dL (ref 0.0–0.4)
Alpha-2-Globulin: 0.7 g/dL (ref 0.4–1.0)
Beta Globulin: 0.8 g/dL (ref 0.7–1.3)
Gamma Globulin: 0.5 g/dL (ref 0.4–1.8)
Globulin, Total: 2.2 g/dL (ref 2.2–3.9)
Total Protein ELP: 5.8 g/dL — ABNORMAL LOW (ref 6.0–8.5)

## 2023-06-14 ENCOUNTER — Encounter: Payer: Self-pay | Admitting: Hematology & Oncology

## 2023-06-14 ENCOUNTER — Ambulatory Visit: Payer: Self-pay | Admitting: Hematology & Oncology

## 2023-06-14 ENCOUNTER — Inpatient Hospital Stay (HOSPITAL_BASED_OUTPATIENT_CLINIC_OR_DEPARTMENT_OTHER): Admitting: Hematology & Oncology

## 2023-06-14 ENCOUNTER — Inpatient Hospital Stay

## 2023-06-14 ENCOUNTER — Inpatient Hospital Stay: Attending: Hematology & Oncology

## 2023-06-14 VITALS — BP 140/56 | HR 57 | Temp 97.8°F | Resp 18 | Ht 74.0 in | Wt 169.8 lb

## 2023-06-14 DIAGNOSIS — R5383 Other fatigue: Secondary | ICD-10-CM | POA: Diagnosis not present

## 2023-06-14 DIAGNOSIS — D472 Monoclonal gammopathy: Secondary | ICD-10-CM | POA: Diagnosis not present

## 2023-06-14 DIAGNOSIS — C9000 Multiple myeloma not having achieved remission: Secondary | ICD-10-CM | POA: Insufficient documentation

## 2023-06-14 DIAGNOSIS — R634 Abnormal weight loss: Secondary | ICD-10-CM | POA: Diagnosis not present

## 2023-06-14 DIAGNOSIS — Z87891 Personal history of nicotine dependence: Secondary | ICD-10-CM | POA: Insufficient documentation

## 2023-06-14 DIAGNOSIS — D509 Iron deficiency anemia, unspecified: Secondary | ICD-10-CM | POA: Diagnosis not present

## 2023-06-14 DIAGNOSIS — Z5112 Encounter for antineoplastic immunotherapy: Secondary | ICD-10-CM | POA: Diagnosis present

## 2023-06-14 LAB — CBC WITH DIFFERENTIAL (CANCER CENTER ONLY)
Abs Immature Granulocytes: 0.06 10*3/uL (ref 0.00–0.07)
Basophils Absolute: 0.1 10*3/uL (ref 0.0–0.1)
Basophils Relative: 1 %
Eosinophils Absolute: 0.2 10*3/uL (ref 0.0–0.5)
Eosinophils Relative: 3 %
HCT: 41.1 % (ref 39.0–52.0)
Hemoglobin: 13.1 g/dL (ref 13.0–17.0)
Immature Granulocytes: 1 %
Lymphocytes Relative: 23 %
Lymphs Abs: 1.4 10*3/uL (ref 0.7–4.0)
MCH: 29.3 pg (ref 26.0–34.0)
MCHC: 31.9 g/dL (ref 30.0–36.0)
MCV: 91.9 fL (ref 80.0–100.0)
Monocytes Absolute: 0.3 10*3/uL (ref 0.1–1.0)
Monocytes Relative: 6 %
Neutro Abs: 4 10*3/uL (ref 1.7–7.7)
Neutrophils Relative %: 66 %
Platelet Count: 194 10*3/uL (ref 150–400)
RBC: 4.47 MIL/uL (ref 4.22–5.81)
RDW: 13.5 % (ref 11.5–15.5)
WBC Count: 6 10*3/uL (ref 4.0–10.5)
nRBC: 0 % (ref 0.0–0.2)

## 2023-06-14 LAB — IRON AND IRON BINDING CAPACITY (CC-WL,HP ONLY)
Iron: 89 ug/dL (ref 45–182)
Saturation Ratios: 32 % (ref 17.9–39.5)
TIBC: 277 ug/dL (ref 250–450)
UIBC: 188 ug/dL (ref 117–376)

## 2023-06-14 LAB — CMP (CANCER CENTER ONLY)
ALT: 8 U/L (ref 0–44)
AST: 13 U/L — ABNORMAL LOW (ref 15–41)
Albumin: 4.3 g/dL (ref 3.5–5.0)
Alkaline Phosphatase: 126 U/L (ref 38–126)
Anion gap: 7 (ref 5–15)
BUN: 24 mg/dL — ABNORMAL HIGH (ref 8–23)
CO2: 28 mmol/L (ref 22–32)
Calcium: 8.8 mg/dL — ABNORMAL LOW (ref 8.9–10.3)
Chloride: 108 mmol/L (ref 98–111)
Creatinine: 1.56 mg/dL — ABNORMAL HIGH (ref 0.61–1.24)
GFR, Estimated: 44 mL/min — ABNORMAL LOW (ref >60)
Glucose, Bld: 131 mg/dL — ABNORMAL HIGH (ref 70–99)
Potassium: 5.1 mmol/L (ref 3.5–5.1)
Sodium: 143 mmol/L (ref 135–145)
Total Bilirubin: 0.4 mg/dL (ref 0.0–1.2)
Total Protein: 6.5 g/dL (ref 6.5–8.1)

## 2023-06-14 LAB — RETICULOCYTES
Immature Retic Fract: 7.1 % (ref 2.3–15.9)
RBC.: 4.5 MIL/uL (ref 4.22–5.81)
Retic Count, Absolute: 40.1 10*3/uL (ref 19.0–186.0)
Retic Ct Pct: 0.9 % (ref 0.4–3.1)

## 2023-06-14 LAB — FERRITIN: Ferritin: 329 ng/mL (ref 24–336)

## 2023-06-14 MED ORDER — ACETAMINOPHEN 325 MG PO TABS: 650.0000 mg | ORAL_TABLET | Freq: Once | ORAL | Status: DC

## 2023-06-14 MED ORDER — DARATUMUMAB-HYALURONIDASE-FIHJ 1800-30000 MG-UT/15ML ~~LOC~~ SOLN
1800.0000 mg | Freq: Once | SUBCUTANEOUS | Status: AC
  Administered 2023-06-14: 1800 mg via SUBCUTANEOUS
  Filled 2023-06-14: qty 15

## 2023-06-14 MED ORDER — DEXAMETHASONE 4 MG PO TABS
20.0000 mg | ORAL_TABLET | Freq: Once | ORAL | Status: DC
Start: 2023-06-14 — End: 2023-06-14

## 2023-06-14 MED ORDER — DIPHENHYDRAMINE HCL 25 MG PO CAPS: 50.0000 mg | ORAL_CAPSULE | Freq: Once | ORAL | Status: DC

## 2023-06-14 NOTE — Progress Notes (Signed)
 Hematology and Oncology Follow Up Visit  James Marks [id number] 04-Jul-1940 83 y.o. 06/14/2023   Principle Diagnosis:  IgA kappa  myeloma Iron  deficiency anemia Erythropoietin  deficiency anemia  Past Therapy: Velcade /Revlimid /Decadron  -- s/p cycle #6 -- start on 07/27/2021   Current Therapy:        Faspro/Velcade /Revlimid /Decadron  -- s/p cycle #17 - start  on 01/28/2022 --Revlimid   DC'd on 02/24/2022 - Velcade  d/c on 12/01/2022 Venofer  300 mg IV given on 04/06/2023 Aranesp 300 mcg subcu q. 4 weeks.   Interim History:  James Marks is here today for follow-up and Faspro.  He has she has done incredibly well with the IV iron .  His hemoglobin has come up quite nicely.  He has better appetite.  He is more energetic.  His wife certainly has noticed the difference.  When we last saw him, his ferritin was 334 with an iron  saturation of 30%.  His myeloma studies have been doing quite nicely.  There is no monoclonal spike in his blood.  His kappa light chain is 2.7 mg/dL.  The IgA level is 144 mg/dL.  He has had no problems with pain.  Has had no bleeding.  Has had no change in bowel or bladder habits.  He has had no cough.  There has been no fever.  He has had no leg swelling.  Overall, I would have to say that his performance status is probably ECOG 1.    Medications:  Allergies as of 06/14/2023   No Known Allergies      Medication List        Accurate as of June 14, 2023 10:51 AM. If you have any questions, ask your nurse or doctor.          acetaminophen  325 MG tablet Commonly known as: TYLENOL  Take 325 mg by mouth. 02/10/2022 Takes 325 mg one hour prior to antibody treatment.   acyclovir  400 MG tablet Commonly known as: ZOVIRAX  TAKE 1 TABLET(400 MG) BY MOUTH TWICE DAILY   amLODipine  5 MG tablet Commonly known as: NORVASC  Take 1 tablet (5 mg total) by mouth daily.   amoxicillin  500 MG capsule Commonly known as: AMOXIL  Take four tablets (2000 mg) total one hour  prior to dental procedure.   BABY ASPIRIN PO Take 81 mg by mouth daily.   dexamethasone  4 MG tablet Commonly known as: DECADRON  TAKE 5 TABLETS BY MOUTH 1 HOUR PRIOR TO CHEMO TREATMENT   diphenhydrAMINE  25 mg capsule Commonly known as: BENADRYL  Take 50 mg by mouth. 02/10/2022 Takes one hour prior to antibody treatment.   gabapentin  300 MG capsule Commonly known as: NEURONTIN  TAKE 1 CAPSULE BY MOUTH EVERY DAY WITH BREAKFAST, 1 CAPSULE WITH DINNER AND 2 CAPSULES AT BEDTIME   losartan  50 MG tablet Commonly known as: COZAAR  Take 50 mg by mouth daily.   montelukast  10 MG tablet Commonly known as: Singulair  Take 1 tablet (10 mg total) by mouth at bedtime.   ondansetron  8 MG tablet Commonly known as: Zofran  Take 1 tablet (8 mg total) by mouth every 8 (eight) hours as needed for nausea or vomiting.   PRESERVISION AREDS 2 PO Take by mouth daily at 6 (six) AM.   prochlorperazine  10 MG tablet Commonly known as: COMPAZINE  Take 1 tablet (10 mg total) by mouth every 6 (six) hours as needed for nausea or vomiting.   traZODone  50 MG tablet Commonly known as: DESYREL  Take 1 tablet (50 mg total) by mouth at bedtime.        Allergies:  No Known Allergies  Past Medical History, Surgical history, Social history, and Family History were reviewed and updated.  Review of Systems:  Review of Systems  Constitutional:  Positive for malaise/fatigue and weight loss.  HENT: Negative.    Eyes: Negative.   Respiratory: Negative.    Cardiovascular: Negative.   Gastrointestinal: Negative.   Genitourinary: Negative.   Musculoskeletal: Negative.   Skin: Negative.   Neurological: Negative.   Endo/Heme/Allergies: Negative.   Psychiatric/Behavioral: Negative.        Physical Exam:  Vital signs show temperature of 97.8.  Pulse 57.  Blood pressure 140/56.  Weight is 169 pounds.     Wt Readings from Last 3 Encounters:  06/14/23 169 lb 12 oz (77 kg)  05/17/23 167 lb (75.8 kg)  04/19/23  165 lb (74.8 kg)    Physical Exam Vitals reviewed.  HENT:     Head: Normocephalic and atraumatic.  Eyes:     Pupils: Pupils are equal, round, and reactive to light.  Cardiovascular:     Rate and Rhythm: Normal rate and regular rhythm.     Heart sounds: Normal heart sounds.  Pulmonary:     Effort: Pulmonary effort is normal.     Breath sounds: Normal breath sounds.  Abdominal:     General: Bowel sounds are normal.     Palpations: Abdomen is soft.  Musculoskeletal:        General: No tenderness or deformity. Normal range of motion.     Cervical back: Normal range of motion.  Lymphadenopathy:     Cervical: No cervical adenopathy.  Skin:    General: Skin is warm and dry.     Findings: No erythema or rash.  Neurological:     Mental Status: He is alert and oriented to person, place, and time.  Psychiatric:        Behavior: Behavior normal.        Thought Content: Thought content normal.        Judgment: Judgment normal.      Lab Results  Component Value Date   WBC 6.0 06/14/2023   HGB 13.1 06/14/2023   HCT 41.1 06/14/2023   MCV 91.9 06/14/2023   PLT 194 06/14/2023   Lab Results  Component Value Date   FERRITIN 334 05/17/2023   IRON  86 05/17/2023   TIBC 283 05/17/2023   UIBC 197 05/17/2023   IRONPCTSAT 30 05/17/2023   Lab Results  Component Value Date   RETICCTPCT 0.9 06/14/2023   RBC 4.50 06/14/2023   Lab Results  Component Value Date   KPAFRELGTCHN 26.7 (H) 05/17/2023   LAMBDASER 10.4 05/17/2023   KAPLAMBRATIO 2.57 (H) 05/17/2023   Lab Results  Component Value Date   IGGSERUM 596 (L) 05/17/2023   IGA 144 05/17/2023   IGMSERUM 30 05/17/2023   Lab Results  Component Value Date   TOTALPROTELP 5.8 (L) 05/17/2023   ALBUMINELP 3.6 05/17/2023   A1GS 0.2 05/17/2023   A2GS 0.7 05/17/2023   BETS 0.8 05/17/2023   BETA2SER 0.9 (H) 12/27/2014   GAMS 0.5 05/17/2023   MSPIKE Not Observed 05/17/2023   SPEI Comment 01/28/2023     Chemistry      Component  Value Date/Time   NA 143 06/14/2023 0947   NA 143 12/06/2016 1156   NA 139 08/02/2016 1146   K 5.1 06/14/2023 0947   K 4.2 12/06/2016 1156   K 4.7 08/02/2016 1146   CL 108 06/14/2023 0947   CL 107 12/06/2016 1156   CO2  28 06/14/2023 0947   CO2 28 12/06/2016 1156   CO2 24 08/02/2016 1146   BUN 24 (H) 06/14/2023 0947   BUN 29 (H) 12/06/2016 1156   BUN 34.3 (H) 08/02/2016 1146   CREATININE 1.56 (H) 06/14/2023 0947   CREATININE 2.1 (H) 12/06/2016 1156   CREATININE 1.7 (H) 08/02/2016 1146      Component Value Date/Time   CALCIUM 8.8 (L) 06/14/2023 0947   CALCIUM 9.1 12/06/2016 1156   CALCIUM 9.5 08/02/2016 1146   ALKPHOS 126 06/14/2023 0947   ALKPHOS 108 (H) 12/06/2016 1156   ALKPHOS 115 08/02/2016 1146   AST 13 (L) 06/14/2023 0947   AST 14 08/02/2016 1146   ALT 8 06/14/2023 0947   ALT 18 12/06/2016 1156   ALT 9 08/02/2016 1146   BILITOT 0.4 06/14/2023 0947   BILITOT 0.41 08/02/2016 1146       Impression and Plan: Mr. Brogden is 83 year old man with IgA kappa myeloma.  Currently, he is going on Faspro.  He really has done incredibly well.  Light chains have trended down which is nice to see.  Hopefully the Kappa light chain will continue to trend down.  I am mostly happy that his quality life is better given that his iron  is better.  His hemoglobin has come up quite well.  He does not need any Aranesp.  I would be surprised if he needs any IV iron .  Again, we will continue to follow him along.  I will plan to see him back in another month.  Ivor Mars, MD 6/3/202510:51 AM

## 2023-06-14 NOTE — Patient Instructions (Signed)
 [facility] - A DEPT OF [facility]  Discharge Instructions: Thank you for choosing [facility] to provide your oncology and hematology care.   If you have a lab appointment with the Cancer Center, please go directly to the Cancer Center and check in at the registration area.  Wear comfortable clothing and clothing appropriate for easy access to any Portacath or PICC line.   We strive to give you quality time with your provider. You may need to reschedule your appointment if you arrive late (15 or more minutes).  Arriving late affects you and other patients whose appointments are after yours.  Also, if you miss three or more appointments without notifying the office, you may be dismissed from the clinic at the provider's discretion.      For prescription refill requests, have your pharmacy contact our office and allow 72 hours for refills to be completed.    Today you received the following chemotherapy and/or immunotherapy agents faspro      To help prevent nausea and vomiting after your treatment, we encourage you to take your nausea medication as directed.  BELOW ARE SYMPTOMS THAT SHOULD BE REPORTED IMMEDIATELY: *FEVER GREATER THAN 100.4 F (38 C) OR HIGHER *CHILLS OR SWEATING *NAUSEA AND VOMITING THAT IS NOT CONTROLLED WITH YOUR NAUSEA MEDICATION *UNUSUAL SHORTNESS OF BREATH *UNUSUAL BRUISING OR BLEEDING *URINARY PROBLEMS (pain or burning when urinating, or frequent urination) *BOWEL PROBLEMS (unusual diarrhea, constipation, pain near the anus) TENDERNESS IN MOUTH AND THROAT WITH OR WITHOUT PRESENCE OF ULCERS (sore throat, sores in mouth, or a toothache) UNUSUAL RASH, SWELLING OR PAIN  UNUSUAL VAGINAL DISCHARGE OR ITCHING   Items with * indicate a potential emergency and should be followed up as soon as possible or go to the Emergency Department if any problems should occur.  Please show the CHEMOTHERAPY ALERT CARD or IMMUNOTHERAPY ALERT  CARD at check-in to the Emergency Department and triage nurse. Should you have questions after your visit or need to cancel or reschedule your appointment, please contact [facility] - A DEPT OF [facility]  [phone] and follow the prompts.  Office hours are 8:00 a.m. to 4:30 p.m. Monday - Friday. Please note that voicemails left after 4:00 p.m. may not be returned until the following business day.  We are closed weekends and major holidays. You have access to a nurse at all times for urgent questions. Please call the main number to the clinic [phone] and follow the prompts.  For any non-urgent questions, you may also contact your provider using MyChart. We now offer e-Visits for anyone 66 and older to request care online for non-urgent symptoms. For details visit [email or web address].   Also download the MyChart app! Go to the app store, search "MyChart", open the app, select , and log in with your MyChart username and password.

## 2023-06-15 LAB — PROTEIN ELECTROPHORESIS, SERUM, WITH REFLEX
A/G Ratio: 1.7 (ref 0.7–1.7)
Albumin ELP: 3.8 g/dL (ref 2.9–4.4)
Alpha-1-Globulin: 0.2 g/dL (ref 0.0–0.4)
Alpha-2-Globulin: 0.7 g/dL (ref 0.4–1.0)
Beta Globulin: 0.8 g/dL (ref 0.7–1.3)
Gamma Globulin: 0.5 g/dL (ref 0.4–1.8)
Globulin, Total: 2.2 g/dL (ref 2.2–3.9)
Total Protein ELP: 6 g/dL (ref 6.0–8.5)

## 2023-06-15 LAB — IGG, IGA, IGM
IgA: 143 mg/dL (ref 61–437)
IgG (Immunoglobin G), Serum: 612 mg/dL (ref 603–1613)
IgM (Immunoglobulin M), Srm: 33 mg/dL (ref 15–143)

## 2023-06-15 LAB — KAPPA/LAMBDA LIGHT CHAINS
Kappa free light chain: 28.6 mg/L — ABNORMAL HIGH (ref 3.3–19.4)
Kappa, lambda light chain ratio: 2.55 — ABNORMAL HIGH (ref 0.26–1.65)
Lambda free light chains: 11.2 mg/L (ref 5.7–26.3)

## 2023-06-16 ENCOUNTER — Encounter: Payer: Self-pay | Admitting: Hematology & Oncology

## 2023-06-16 ENCOUNTER — Other Ambulatory Visit: Payer: Self-pay

## 2023-06-16 NOTE — Telephone Encounter (Signed)
 Advised via MyChart.

## 2023-06-16 NOTE — Telephone Encounter (Signed)
-----   Message from Ivor Mars sent at 06/15/2023  4:57 PM EDT ----- Please call and let him know that the light chain is still stable at 28.6.  Twilla Galea

## 2023-06-29 ENCOUNTER — Other Ambulatory Visit: Payer: Self-pay

## 2023-07-02 ENCOUNTER — Other Ambulatory Visit: Payer: Self-pay | Admitting: Family Medicine

## 2023-07-04 ENCOUNTER — Encounter: Payer: Self-pay | Admitting: Family Medicine

## 2023-07-04 ENCOUNTER — Ambulatory Visit: Payer: Self-pay

## 2023-07-04 ENCOUNTER — Ambulatory Visit (INDEPENDENT_AMBULATORY_CARE_PROVIDER_SITE_OTHER): Admitting: Family Medicine

## 2023-07-04 VITALS — BP 132/60 | HR 69 | Temp 97.9°F | Wt 168.1 lb

## 2023-07-04 DIAGNOSIS — I10 Essential (primary) hypertension: Secondary | ICD-10-CM

## 2023-07-04 DIAGNOSIS — M722 Plantar fascial fibromatosis: Secondary | ICD-10-CM

## 2023-07-04 NOTE — Patient Instructions (Signed)
 Consider insole/arch support  Stretch calf/plantar fascia regularly.   We might need to look at steroid injection if not improving with the above.

## 2023-07-04 NOTE — Telephone Encounter (Signed)
 FYI Only or Action Required?: FYI only for provider.  Patient was last seen in primary care on 02/21/2023 by Micheal Wolm ORN, MD. Called Nurse Triage reporting Foot Pain. Symptoms began several weeks ago. Interventions attempted: OTC medications: aleve. Symptoms are: unchanged.  Triage Disposition: See PCP When Office is Open (Within 3 Days)  Patient/caregiver understands and will follow disposition?: Yes        Copied from CRM [id number]. Topic: Clinical - Red Word Triage >> Jul 04, 2023  9:13 AM Larissa RAMAN wrote: [facility] that prompted transfer to Nurse Triage: pain in RT heel -3 weeks Reason for Disposition  [1] MODERATE pain (e.g., interferes with normal activities, limping) AND [2] present > 3 days  Answer Assessment - Initial Assessment Questions 1. ONSET: When did the pain start?      3 weeks ago 2. LOCATION: Where is the pain located?      right 3. PAIN: How bad is the pain?    (Scale 1-10; or mild, moderate, severe)  - MILD (1-3): doesn't interfere with normal activities.   - MODERATE (4-7): interferes with normal activities (e.g., work or school) or awakens from sleep, limping.   - SEVERE (8-10): excruciating pain, unable to do any normal activities, unable to walk.      mod 4. WORK OR EXERCISE: Has there been any recent work or exercise that involved this part of the body?      no 5. CAUSE: What do you think is causing the foot pain?     Plantar fasciitis.  6. OTHER SYMPTOMS: Do you have any other symptoms? (e.g., leg pain, rash, fever, numbness)     no  Protocols used: Foot Pain-A-AH

## 2023-07-04 NOTE — Progress Notes (Signed)
 Established Patient Office Visit  Subjective   Patient ID: James Marks, male    DOB: [birth year]  Age: 83 y.o. MRN: [id number]  Chief Complaint  Patient presents with   Foot Pain    Patient complains of right foot pain, x4 weeks     HPI   James Marks is seen today with some progressive right foot pain over the past 4 weeks or so.  Symptoms worse after working outdoors [place] last week.  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  His pain is he will right foot and worse when he first gets up in the morning.  No Achilles pain.  Denies any injury.  He has tried some icing without much improvement.  Has fairly high arches.  Has generally well cushioned shoes.  He has hypertension.  We added amlodipine  last year.  At 1 point he was taking higher dose of losartan  and became hypotensive.  His oncologist scaled his losartan  back to 50 mg and remains on amlodipine  5 mg and that combination seems to be working well.  No recent or consistent orthostatic symptoms.  He does have history of gout but no recent flareups.  He has some chronic peripheral neuropathy related to myeloma.  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 His smoking use included cigarettes. He quit smokeless tobacco use about 10 years ago. He reports that he does not drink alcohol and does not use drugs. family history includes Cancer in his mother; Diabetes in his mother and another family  member; Diabetes Mellitus II in his brother; Hypertension in his father; Macular degeneration in his brother. No Known Allergies  Review of Systems  Constitutional:  Negative for chills, fever and malaise/fatigue.  Eyes:  Negative for blurred vision.  Respiratory:  Negative for shortness of breath.   Cardiovascular:  Negative for chest pain.  Neurological:  Negative for dizziness, weakness and headaches.      Objective:     BP 132/60 (BP Location: Left Arm, Patient Position: Sitting, Cuff Size: Normal)   Pulse 69   Temp 97.9 F (36.6 C) (Oral)   Wt 168 lb 1.6 oz (76.2 kg)   SpO2 95%   BMI 21.58 kg/m  BP Readings from Last 3 Encounters:  07/04/23 132/60  06/14/23 (!) 140/56  05/17/23 (!) 148/60   Wt Readings from Last 3 Encounters:  07/04/23 168 lb 1.6 oz (76.2 kg)  06/14/23 169 lb 12 oz (77 kg)  05/17/23 167 lb (75.8 kg)      Physical Exam Vitals reviewed.  Constitutional:      General: He is not in acute distress.    Appearance: He is well-developed. He is not ill-appearing.  HENT:     Right Ear: External ear normal.     Left Ear: External ear normal.   Eyes:     Pupils: Pupils are equal,  round, and reactive to light.   Neck:     Thyroid : No thyromegaly.   Cardiovascular:     Rate and Rhythm: Normal rate and regular rhythm.  Pulmonary:     Effort: Pulmonary effort is normal. No respiratory distress.     Breath sounds: Normal breath sounds. No wheezing or rales.   Musculoskeletal:     Cervical back: Neck supple.     Right lower leg: No edema.     Left lower leg: No edema.     Comments: Right foot reveals no visible swelling.  Feet are warm to touch.  No Achilles tenderness.  No pain with inversion or eversion, plantarflexion, or dorsiflexion.  He has tenderness over the calcaneal attachment of the plantar fascia.   Neurological:     Mental Status: He is alert and oriented to person, place, and time.      No results found for any visits on  07/04/23.    The ASCVD Risk score (Arnett DK, et al., 2019) failed to calculate for the following reasons:   The 2019 ASCVD risk score is only valid for ages 26 to 10    Assessment & Plan:   #1 right plantar fasciitis.  Probably exacerbated by increased yard work recently.  Patient wishes to avoid steroid injection if possible.  We discussed conservative measures including:  -Regular stretching - Icing - Insole support - Well cushioned shoes - If not improving with the above consider steroid injection or podiatry referral  #2 hypertension stable.  Continue amlodipine  5 mg daily and losartan  50 mg daily.  Continue low-sodium diet  #3 gout history.  No recent gout flares.  Try to avoid thiazides  Wolm Scarlet, MD

## 2023-07-08 ENCOUNTER — Other Ambulatory Visit: Payer: Self-pay | Admitting: Family Medicine

## 2023-07-12 ENCOUNTER — Inpatient Hospital Stay

## 2023-07-12 ENCOUNTER — Encounter: Payer: Self-pay | Admitting: Hematology & Oncology

## 2023-07-12 ENCOUNTER — Other Ambulatory Visit: Payer: Self-pay

## 2023-07-12 ENCOUNTER — Inpatient Hospital Stay (HOSPITAL_BASED_OUTPATIENT_CLINIC_OR_DEPARTMENT_OTHER): Admitting: Hematology & Oncology

## 2023-07-12 ENCOUNTER — Inpatient Hospital Stay: Attending: Hematology & Oncology

## 2023-07-12 VITALS — BP 117/53 | HR 51 | Temp 97.8°F | Resp 16 | Ht 74.0 in | Wt 169.0 lb

## 2023-07-12 DIAGNOSIS — Z87891 Personal history of nicotine dependence: Secondary | ICD-10-CM | POA: Diagnosis not present

## 2023-07-12 DIAGNOSIS — Z5112 Encounter for antineoplastic immunotherapy: Secondary | ICD-10-CM | POA: Diagnosis present

## 2023-07-12 DIAGNOSIS — R5383 Other fatigue: Secondary | ICD-10-CM | POA: Insufficient documentation

## 2023-07-12 DIAGNOSIS — D509 Iron deficiency anemia, unspecified: Secondary | ICD-10-CM | POA: Diagnosis not present

## 2023-07-12 DIAGNOSIS — D472 Monoclonal gammopathy: Secondary | ICD-10-CM

## 2023-07-12 DIAGNOSIS — C9000 Multiple myeloma not having achieved remission: Secondary | ICD-10-CM | POA: Diagnosis present

## 2023-07-12 DIAGNOSIS — R634 Abnormal weight loss: Secondary | ICD-10-CM | POA: Diagnosis not present

## 2023-07-12 LAB — CBC WITH DIFFERENTIAL (CANCER CENTER ONLY)
Abs Immature Granulocytes: 0.04 10*3/uL (ref 0.00–0.07)
Basophils Absolute: 0 10*3/uL (ref 0.0–0.1)
Basophils Relative: 0 %
Eosinophils Absolute: 0.2 10*3/uL (ref 0.0–0.5)
Eosinophils Relative: 2 %
HCT: 41.1 % (ref 39.0–52.0)
Hemoglobin: 13.1 g/dL (ref 13.0–17.0)
Immature Granulocytes: 1 %
Lymphocytes Relative: 21 %
Lymphs Abs: 1.8 10*3/uL (ref 0.7–4.0)
MCH: 29.2 pg (ref 26.0–34.0)
MCHC: 31.9 g/dL (ref 30.0–36.0)
MCV: 91.5 fL (ref 80.0–100.0)
Monocytes Absolute: 0.6 10*3/uL (ref 0.1–1.0)
Monocytes Relative: 7 %
Neutro Abs: 5.7 10*3/uL (ref 1.7–7.7)
Neutrophils Relative %: 69 %
Platelet Count: 197 10*3/uL (ref 150–400)
RBC: 4.49 MIL/uL (ref 4.22–5.81)
RDW: 13.5 % (ref 11.5–15.5)
WBC Count: 8.3 10*3/uL (ref 4.0–10.5)
nRBC: 0 % (ref 0.0–0.2)

## 2023-07-12 LAB — CMP (CANCER CENTER ONLY)
ALT: 8 U/L (ref 0–44)
AST: 10 U/L — ABNORMAL LOW (ref 15–41)
Albumin: 4 g/dL (ref 3.5–5.0)
Alkaline Phosphatase: 123 U/L (ref 38–126)
Anion gap: 8 (ref 5–15)
BUN: 32 mg/dL — ABNORMAL HIGH (ref 8–23)
CO2: 27 mmol/L (ref 22–32)
Calcium: 8.7 mg/dL — ABNORMAL LOW (ref 8.9–10.3)
Chloride: 108 mmol/L (ref 98–111)
Creatinine: 1.49 mg/dL — ABNORMAL HIGH (ref 0.61–1.24)
GFR, Estimated: 46 mL/min — ABNORMAL LOW (ref >60)
Glucose, Bld: 102 mg/dL — ABNORMAL HIGH (ref 70–99)
Potassium: 4.7 mmol/L (ref 3.5–5.1)
Sodium: 143 mmol/L (ref 135–145)
Total Bilirubin: 0.3 mg/dL (ref 0.0–1.2)
Total Protein: 6.3 g/dL — ABNORMAL LOW (ref 6.5–8.1)

## 2023-07-12 LAB — FERRITIN: Ferritin: 199 ng/mL (ref 24–336)

## 2023-07-12 LAB — LACTATE DEHYDROGENASE: LDH: 106 U/L (ref 98–192)

## 2023-07-12 MED ORDER — DARATUMUMAB-HYALURONIDASE-FIHJ 1800-30000 MG-UT/15ML ~~LOC~~ SOLN
1800.0000 mg | Freq: Once | SUBCUTANEOUS | Status: AC
  Administered 2023-07-12: 1800 mg via SUBCUTANEOUS
  Filled 2023-07-12: qty 15

## 2023-07-12 MED ORDER — ACETAMINOPHEN 325 MG PO TABS
650.0000 mg | ORAL_TABLET | Freq: Once | ORAL | Status: DC
Start: 2023-07-12 — End: 2023-07-12

## 2023-07-12 MED ORDER — DIPHENHYDRAMINE HCL 25 MG PO CAPS: 50.0000 mg | ORAL_CAPSULE | Freq: Once | ORAL | Status: DC

## 2023-07-12 MED ORDER — DEXAMETHASONE 4 MG PO TABS: 20.0000 mg | ORAL_TABLET | Freq: Once | ORAL | Status: DC

## 2023-07-12 NOTE — Progress Notes (Signed)
 Hematology and Oncology Follow Up Visit  DEMPSY Marks [id number] 02-18-1940 83 y.o. 07/12/2023   Principle Diagnosis:  IgA kappa  myeloma Iron  deficiency anemia Erythropoietin  deficiency anemia  Past Therapy: Velcade /Revlimid /Decadron  -- s/p cycle #6 -- start on 07/27/2021   Current Therapy:        Faspro/Velcade /Revlimid /Decadron  -- s/p cycle #17 - start  on 01/28/2022 --Revlimid   DC'd on 02/24/2022 - Velcade  d/c on 12/01/2022 Venofer  300 mg IV given on 04/06/2023 Aranesp 300 mcg subcu q. 4 weeks.   Interim History:  James Marks is here today for follow-up and Faspro.  So far, he is doing pretty well.  He really has had no complaints.  I think that the iron  truly has helped him quite a bit.  His hemoglobin has held steady.  His last Light chain was also holding steady at 2.8 mg/dL.  He has had no fever.  He has had no change in bowel or bladder habits.  He has had no cough or shortness of breath.  He is not smoking.  There is been no leg swelling.  Overall, I would say that his performance status is ECOG 1.    Medications:  Allergies as of 07/12/2023   No Known Allergies      Medication List        Accurate as of July 12, 2023  1:25 PM. If you have any questions, ask your nurse or doctor.          acetaminophen  325 MG tablet Commonly known as: TYLENOL  Take 325 mg by mouth. 02/10/2022 Takes 325 mg one hour prior to antibody treatment.   acyclovir  400 MG tablet Commonly known as: ZOVIRAX  TAKE 1 TABLET(400 MG) BY MOUTH TWICE DAILY   amLODipine  5 MG tablet Commonly known as: NORVASC  TAKE 1 TABLET(5 MG) BY MOUTH DAILY   amoxicillin  500 MG capsule Commonly known as: AMOXIL  Take four tablets (2000 mg) total one hour prior to dental procedure.   BABY ASPIRIN PO Take 81 mg by mouth daily.   dexamethasone  4 MG tablet Commonly known as: DECADRON  TAKE 5 TABLETS BY MOUTH 1 HOUR PRIOR TO CHEMO TREATMENT   diphenhydrAMINE  25 mg capsule Commonly known as:  BENADRYL  Take 50 mg by mouth. 02/10/2022 Takes one hour prior to antibody treatment.   gabapentin  300 MG capsule Commonly known as: NEURONTIN  TAKE 1 CAPSULE BY MOUTH EVERY DAY WITH BREAKFAST, 1 CAPSULE WITH DINNER AND 2 CAPSULES AT BEDTIME   losartan  50 MG tablet Commonly known as: COZAAR  Take 50 mg by mouth daily.   montelukast  10 MG tablet Commonly known as: Singulair  Take 1 tablet (10 mg total) by mouth at bedtime.   ondansetron  8 MG tablet Commonly known as: Zofran  Take 1 tablet (8 mg total) by mouth every 8 (eight) hours as needed for nausea or vomiting.   PRESERVISION AREDS 2 PO Take by mouth daily at 6 (six) AM.   prochlorperazine  10 MG tablet Commonly known as: COMPAZINE  Take 1 tablet (10 mg total) by mouth every 6 (six) hours as needed for nausea or vomiting.   traZODone  50 MG tablet Commonly known as: DESYREL  Take 1 tablet (50 mg total) by mouth at bedtime.        Allergies: No Known Allergies  Past Medical History, Surgical history, Social history, and Family History were reviewed and updated.  Review of Systems:  Review of Systems  Constitutional:  Positive for malaise/fatigue and weight loss.  HENT: Negative.    Eyes: Negative.   Respiratory: Negative.  Cardiovascular: Negative.   Gastrointestinal: Negative.   Genitourinary: Negative.   Musculoskeletal: Negative.   Skin: Negative.   Neurological: Negative.   Endo/Heme/Allergies: Negative.   Psychiatric/Behavioral: Negative.        Physical Exam:  Vital signs show temperature of 97.8.  Pulse 57.  Blood pressure 140/56.  Weight is 169 pounds.     Wt Readings from Last 3 Encounters:  07/12/23 169 lb (76.7 kg)  07/04/23 168 lb 1.6 oz (76.2 kg)  06/14/23 169 lb 12 oz (77 kg)    Physical Exam Vitals reviewed.  HENT:     Head: Normocephalic and atraumatic.   Eyes:     Pupils: Pupils are equal, round, and reactive to light.    Cardiovascular:     Rate and Rhythm: Normal rate and  regular rhythm.     Heart sounds: Normal heart sounds.  Pulmonary:     Effort: Pulmonary effort is normal.     Breath sounds: Normal breath sounds.  Abdominal:     General: Bowel sounds are normal.     Palpations: Abdomen is soft.   Musculoskeletal:        General: No tenderness or deformity. Normal range of motion.     Cervical back: Normal range of motion.  Lymphadenopathy:     Cervical: No cervical adenopathy.   Skin:    General: Skin is warm and dry.     Findings: No erythema or rash.   Neurological:     Mental Status: He is alert and oriented to person, place, and time.   Psychiatric:        Behavior: Behavior normal.        Thought Content: Thought content normal.        Judgment: Judgment normal.     Lab Results  Component Value Date   WBC 8.3 07/12/2023   HGB 13.1 07/12/2023   HCT 41.1 07/12/2023   MCV 91.5 07/12/2023   PLT 197 07/12/2023   Lab Results  Component Value Date   FERRITIN 329 06/14/2023   IRON  89 06/14/2023   TIBC 277 06/14/2023   UIBC 188 06/14/2023   IRONPCTSAT 32 06/14/2023   Lab Results  Component Value Date   RETICCTPCT 0.9 06/14/2023   RBC 4.49 07/12/2023   Lab Results  Component Value Date   KPAFRELGTCHN 28.6 (H) 06/14/2023   LAMBDASER 11.2 06/14/2023   KAPLAMBRATIO 2.55 (H) 06/14/2023   Lab Results  Component Value Date   IGGSERUM 612 06/14/2023   IGA 143 06/14/2023   IGMSERUM 33 06/14/2023   Lab Results  Component Value Date   TOTALPROTELP 6.0 06/14/2023   ALBUMINELP 3.8 06/14/2023   A1GS 0.2 06/14/2023   A2GS 0.7 06/14/2023   BETS 0.8 06/14/2023   BETA2SER 0.9 (H) 12/27/2014   GAMS 0.5 06/14/2023   MSPIKE Not Observed 06/14/2023   SPEI Comment 01/28/2023     Chemistry      Component Value Date/Time   NA 143 07/12/2023 1231   NA 143 12/06/2016 1156   NA 139 08/02/2016 1146   K 4.7 07/12/2023 1231   K 4.2 12/06/2016 1156   K 4.7 08/02/2016 1146   CL 108 07/12/2023 1231   CL 107 12/06/2016 1156   CO2 27  07/12/2023 1231   CO2 28 12/06/2016 1156   CO2 24 08/02/2016 1146   BUN 32 (H) 07/12/2023 1231   BUN 29 (H) 12/06/2016 1156   BUN 34.3 (H) 08/02/2016 1146   CREATININE 1.49 (H) 07/12/2023 1231  CREATININE 2.1 (H) 12/06/2016 1156   CREATININE 1.7 (H) 08/02/2016 1146      Component Value Date/Time   CALCIUM 8.7 (L) 07/12/2023 1231   CALCIUM 9.1 12/06/2016 1156   CALCIUM 9.5 08/02/2016 1146   ALKPHOS 123 07/12/2023 1231   ALKPHOS 108 (H) 12/06/2016 1156   ALKPHOS 115 08/02/2016 1146   AST 10 (L) 07/12/2023 1231   AST 14 08/02/2016 1146   ALT 8 07/12/2023 1231   ALT 18 12/06/2016 1156   ALT 9 08/02/2016 1146   BILITOT 0.3 07/12/2023 1231   BILITOT 0.41 08/02/2016 1146       Impression and Plan: Mr. Nordin is 83 year old man with IgA kappa myeloma.  Currently, he is going on Faspro.  He really has done incredibly well.  Light chains have trended down which is nice to see.  Hopefully the Kappa light chain will continue to trend down.  I think that the fact that he is on being treated once a month is amazing.  He certainly has done quite well with this.  Again, we will plan to get him back to see me in 1 month.   Maude JONELLE Crease, MD 7/1/20251:25 PM

## 2023-07-12 NOTE — Patient Instructions (Signed)
 [facility] - A DEPT OF [facility]  Discharge Instructions: Thank you for choosing [facility] to provide your oncology and hematology care.   If you have a lab appointment with the Cancer Center, please go directly to the Cancer Center and check in at the registration area.  Wear comfortable clothing and clothing appropriate for easy access to any Portacath or PICC line.   We strive to give you quality time with your provider. You may need to reschedule your appointment if you arrive late (15 or more minutes).  Arriving late affects you and other patients whose appointments are after yours.  Also, if you miss three or more appointments without notifying the office, you may be dismissed from the clinic at the provider's discretion.      For prescription refill requests, have your pharmacy contact our office and allow 72 hours for refills to be completed.    Today you received the following chemotherapy and/or immunotherapy agents Velcade      To help prevent nausea and vomiting after your treatment, we encourage you to take your nausea medication as directed.  BELOW ARE SYMPTOMS THAT SHOULD BE REPORTED IMMEDIATELY: *FEVER GREATER THAN 100.4 F (38 C) OR HIGHER *CHILLS OR SWEATING *NAUSEA AND VOMITING THAT IS NOT CONTROLLED WITH YOUR NAUSEA MEDICATION *UNUSUAL SHORTNESS OF BREATH *UNUSUAL BRUISING OR BLEEDING *URINARY PROBLEMS (pain or burning when urinating, or frequent urination) *BOWEL PROBLEMS (unusual diarrhea, constipation, pain near the anus) TENDERNESS IN MOUTH AND THROAT WITH OR WITHOUT PRESENCE OF ULCERS (sore throat, sores in mouth, or a toothache) UNUSUAL RASH, SWELLING OR PAIN  UNUSUAL VAGINAL DISCHARGE OR ITCHING   Items with * indicate a potential emergency and should be followed up as soon as possible or go to the Emergency Department if any problems should occur.  Please show the CHEMOTHERAPY ALERT CARD or IMMUNOTHERAPY  ALERT CARD at check-in to the Emergency Department and triage nurse. Should you have questions after your visit or need to cancel or reschedule your appointment, please contact [facility] - A DEPT OF [facility]  [phone] and follow the prompts.  Office hours are 8:00 a.m. to 4:30 p.m. Monday - Friday. Please note that voicemails left after 4:00 p.m. may not be returned until the following business day.  We are closed weekends and major holidays. You have access to a nurse at all times for urgent questions. Please call the main number to the clinic [phone] and follow the prompts.  For any non-urgent questions, you may also contact your provider using MyChart. We now offer e-Visits for anyone 69 and older to request care online for non-urgent symptoms. For details visit [email or web address].   Also download the MyChart app! Go to the app store, search "MyChart", open the app, select [place], and log in with your MyChart username and password.

## 2023-07-13 ENCOUNTER — Telehealth: Payer: Self-pay | Admitting: *Deleted

## 2023-07-13 ENCOUNTER — Other Ambulatory Visit: Payer: Self-pay

## 2023-07-13 LAB — IRON AND IRON BINDING CAPACITY (CC-WL,HP ONLY)
Iron: 77 ug/dL (ref 45–182)
Saturation Ratios: 30 % (ref 17.9–39.5)
TIBC: 253 ug/dL (ref 250–450)
UIBC: 176 ug/dL (ref 117–376)

## 2023-07-13 LAB — KAPPA/LAMBDA LIGHT CHAINS
Kappa free light chain: 35.3 mg/L — ABNORMAL HIGH (ref 3.3–19.4)
Kappa, lambda light chain ratio: 2.69 — ABNORMAL HIGH (ref 0.26–1.65)
Lambda free light chains: 13.1 mg/L (ref 5.7–26.3)

## 2023-07-13 LAB — IGG, IGA, IGM
IgA: 135 mg/dL (ref 61–437)
IgG (Immunoglobin G), Serum: 599 mg/dL — ABNORMAL LOW (ref 603–1613)
IgM (Immunoglobulin M), Srm: 29 mg/dL (ref 15–143)

## 2023-07-13 MED ORDER — PREDNISONE 20 MG PO TABS: 20.0000 mg | ORAL_TABLET | Freq: Every day | ORAL | 0 refills | Status: AC

## 2023-07-13 NOTE — Telephone Encounter (Signed)
 Patient's wife informed of message below and rx sent

## 2023-07-13 NOTE — Telephone Encounter (Signed)
 Patient's wife given clarification on prescription.

## 2023-07-13 NOTE — Telephone Encounter (Signed)
 Reason for CRM: Oncologist Dr Alvia said its ok for patient to take protozoal yesterday after his treatment and would need the prescription called in

## 2023-07-13 NOTE — Telephone Encounter (Signed)
 Communication  Reason for CRM: Patients wife calling in regards to prednisone : Dr. Micheal advised against taking prednisone  due to the steroid that Mr. Hoar takes, however, he was seen with his Oncologist and was told that it was okay for him to take prednisone  so Mrs. Vonderhaar would like to propose this idea to Dr. Micheal and see if he would order a prescription.

## 2023-07-13 NOTE — Telephone Encounter (Signed)
 Communication  Reason for CRM: patient Is questioning the dosage and number of pills sent for predniSONE  (DELTASONE ) 20 MG tablet

## 2023-07-18 LAB — PROTEIN ELECTROPHORESIS, SERUM, WITH REFLEX
A/G Ratio: 1.4 (ref 0.7–1.7)
Albumin ELP: 3.4 g/dL (ref 2.9–4.4)
Alpha-1-Globulin: 0.2 g/dL (ref 0.0–0.4)
Alpha-2-Globulin: 0.7 g/dL (ref 0.4–1.0)
Beta Globulin: 0.9 g/dL (ref 0.7–1.3)
Gamma Globulin: 0.6 g/dL (ref 0.4–1.8)
Globulin, Total: 2.4 g/dL (ref 2.2–3.9)
M-Spike, %: 0.2 g/dL — ABNORMAL HIGH
SPEP Interpretation: 0
Total Protein ELP: 5.8 g/dL — ABNORMAL LOW (ref 6.0–8.5)

## 2023-07-18 LAB — IMMUNOFIXATION REFLEX, SERUM
IgA: 134 mg/dL (ref 61–437)
IgG (Immunoglobin G), Serum: 574 mg/dL — ABNORMAL LOW (ref 603–1613)
IgM (Immunoglobulin M), Srm: 35 mg/dL (ref 15–143)

## 2023-08-09 ENCOUNTER — Inpatient Hospital Stay

## 2023-08-09 ENCOUNTER — Inpatient Hospital Stay: Admitting: Hematology & Oncology

## 2023-08-11 ENCOUNTER — Inpatient Hospital Stay

## 2023-08-11 ENCOUNTER — Encounter: Payer: Self-pay | Admitting: Hematology & Oncology

## 2023-08-11 ENCOUNTER — Inpatient Hospital Stay (HOSPITAL_BASED_OUTPATIENT_CLINIC_OR_DEPARTMENT_OTHER): Admitting: Hematology & Oncology

## 2023-08-11 ENCOUNTER — Other Ambulatory Visit: Payer: Self-pay

## 2023-08-11 VITALS — BP 124/52 | HR 61 | Temp 97.8°F | Resp 16 | Ht 74.0 in | Wt 168.0 lb

## 2023-08-11 DIAGNOSIS — D472 Monoclonal gammopathy: Secondary | ICD-10-CM

## 2023-08-11 DIAGNOSIS — Z5112 Encounter for antineoplastic immunotherapy: Secondary | ICD-10-CM | POA: Diagnosis not present

## 2023-08-11 LAB — CBC WITH DIFFERENTIAL (CANCER CENTER ONLY)
Abs Immature Granulocytes: 0.03 K/uL (ref 0.00–0.07)
Basophils Absolute: 0 K/uL (ref 0.0–0.1)
Basophils Relative: 1 %
Eosinophils Absolute: 0.1 K/uL (ref 0.0–0.5)
Eosinophils Relative: 1 %
HCT: 42.9 % (ref 39.0–52.0)
Hemoglobin: 13.5 g/dL (ref 13.0–17.0)
Immature Granulocytes: 0 %
Lymphocytes Relative: 23 %
Lymphs Abs: 1.6 K/uL (ref 0.7–4.0)
MCH: 28.2 pg (ref 26.0–34.0)
MCHC: 31.5 g/dL (ref 30.0–36.0)
MCV: 89.7 fL (ref 80.0–100.0)
Monocytes Absolute: 0.5 K/uL (ref 0.1–1.0)
Monocytes Relative: 7 %
Neutro Abs: 4.6 K/uL (ref 1.7–7.7)
Neutrophils Relative %: 68 %
Platelet Count: 183 K/uL (ref 150–400)
RBC: 4.78 MIL/uL (ref 4.22–5.81)
RDW: 13.6 % (ref 11.5–15.5)
WBC Count: 6.8 K/uL (ref 4.0–10.5)
nRBC: 0 % (ref 0.0–0.2)

## 2023-08-11 LAB — CMP (CANCER CENTER ONLY)
ALT: 10 U/L (ref 0–44)
AST: 16 U/L (ref 15–41)
Albumin: 4.1 g/dL (ref 3.5–5.0)
Alkaline Phosphatase: 130 U/L — ABNORMAL HIGH (ref 38–126)
Anion gap: 11 (ref 5–15)
BUN: 22 mg/dL (ref 8–23)
CO2: 24 mmol/L (ref 22–32)
Calcium: 9.3 mg/dL (ref 8.9–10.3)
Chloride: 107 mmol/L (ref 98–111)
Creatinine: 1.43 mg/dL — ABNORMAL HIGH (ref 0.61–1.24)
GFR, Estimated: 48 mL/min — ABNORMAL LOW (ref >60)
Glucose, Bld: 143 mg/dL — ABNORMAL HIGH (ref 70–99)
Potassium: 5 mmol/L (ref 3.5–5.1)
Sodium: 141 mmol/L (ref 135–145)
Total Bilirubin: 0.3 mg/dL (ref 0.0–1.2)
Total Protein: 6.3 g/dL — ABNORMAL LOW (ref 6.5–8.1)

## 2023-08-11 LAB — LACTATE DEHYDROGENASE: LDH: 132 U/L (ref 98–192)

## 2023-08-11 MED ORDER — DARATUMUMAB-HYALURONIDASE-FIHJ 1800-30000 MG-UT/15ML ~~LOC~~ SOLN
1800.0000 mg | Freq: Once | SUBCUTANEOUS | Status: AC
  Administered 2023-08-11: 1800 mg via SUBCUTANEOUS
  Filled 2023-08-11: qty 15

## 2023-08-11 MED ORDER — DEXAMETHASONE 4 MG PO TABS
20.0000 mg | ORAL_TABLET | Freq: Once | ORAL | Status: DC
Start: 2023-08-11 — End: 2023-08-11

## 2023-08-11 MED ORDER — DIPHENHYDRAMINE HCL 25 MG PO CAPS: 50.0000 mg | ORAL_CAPSULE | Freq: Once | ORAL | Status: DC

## 2023-08-11 MED ORDER — ACETAMINOPHEN 325 MG PO TABS: 650.0000 mg | ORAL_TABLET | Freq: Once | ORAL | Status: DC

## 2023-08-11 NOTE — Progress Notes (Signed)
 Hematology and Oncology Follow Up Visit  James Marks [id number] [birth year] 83 y.o. 08/11/2023   Principle Diagnosis:  IgA kappa  myeloma Iron  deficiency anemia Erythropoietin  deficiency anemia  Past Therapy: Velcade /Revlimid /Decadron  -- s/p cycle #6 -- start on 07/27/2021   Current Therapy:        Faspro/Velcade /Revlimid /Decadron  -- s/p cycle #20 - start  on 01/28/2022 --Revlimid   DC'd on 02/24/2022 - Velcade  d/c on 12/01/2022 Venofer  300 mg IV given on 04/06/2023 Aranesp 300 mcg subcu q. 4 weeks.   Interim History:  James Marks is here today for follow-up and Faspro.  I am little bit worried.  We last checked his monoclonal studies, he did have a monoclonal spike in his blood 0.2 g/dL.  Again, this may have just been reactive.  We will have to see what the level is today.  His last IgG a level was 134 mg/dL.  His last kappa light chain was 3.5 mg/dL.  This is going up slowly.  I think if we do find that he has progressive protein levels, I just may add pomalidomide.  I think this would be reasonable for him.  Meuth I saw him, his ferritin was 199 with a iron  saturation of 30%.  He is smoking a little bit.  He is trying to cut back again.  He has had a good appetite.  He has had no problems with nausea or vomiting.  He has had no change in bowel or bladder habits.  There has been no skin lesions.  He has had no rashes.  He has had no bleeding.  Overall, I would say that his performance status is probably ECOG 1.   Medications:  Allergies as of 08/11/2023   No Known Allergies      Medication List        Accurate as of August 11, 2023 10:58 AM. If you have any questions, ask your nurse or doctor.          acetaminophen  325 MG tablet Commonly known as: TYLENOL  Take 325 mg by mouth. 02/10/2022 Takes 325 mg one hour prior to antibody treatment.   acyclovir  400 MG tablet Commonly known as: ZOVIRAX  TAKE 1 TABLET(400 MG) BY MOUTH TWICE DAILY   amLODipine  2.5 MG  tablet Commonly known as: NORVASC  Take 2.5 mg by mouth daily. What changed: Another medication with the same name was removed. Continue taking this medication, and follow the directions you see here. Changed by: Maude JONELLE Crease   amoxicillin  500 MG capsule Commonly known as: AMOXIL  Take four tablets (2000 mg) total one hour prior to dental procedure.   Aspirin 81 MG Caps Take 81 mg by mouth daily. What changed: Another medication with the same name was removed. Continue taking this medication, and follow the directions you see here. Changed by: Maude JONELLE Crease   dexamethasone  4 MG tablet Commonly known as: DECADRON  TAKE 5 TABLETS BY MOUTH 1 HOUR PRIOR TO CHEMO TREATMENT   diphenhydrAMINE  25 mg capsule Commonly known as: BENADRYL  Take 50 mg by mouth. 02/10/2022 Takes one hour prior to antibody treatment.   gabapentin  300 MG capsule Commonly known as: NEURONTIN  TAKE 1 CAPSULE BY MOUTH EVERY DAY WITH BREAKFAST, 1 CAPSULE WITH DINNER AND 2 CAPSULES AT BEDTIME   losartan  50 MG tablet Commonly known as: COZAAR  Take 50 mg by mouth daily.   montelukast  10 MG tablet Commonly known as: Singulair  Take 1 tablet (10 mg total) by mouth at bedtime.   ondansetron  8 MG tablet Commonly known as:  Zofran  Take 1 tablet (8 mg total) by mouth every 8 (eight) hours as needed for nausea or vomiting.   PRESERVISION AREDS 2 PO Take by mouth daily at 6 (six) AM.   prochlorperazine  10 MG tablet Commonly known as: COMPAZINE  Take 1 tablet (10 mg total) by mouth every 6 (six) hours as needed for nausea or vomiting.   traMADol  50 MG tablet Commonly known as: ULTRAM  Take 50 mg by mouth every 6 (six) hours as needed.   traZODone  50 MG tablet Commonly known as: DESYREL  Take 1 tablet (50 mg total) by mouth at bedtime.        Allergies: No Known Allergies  Past Medical History, Surgical history, Social history, and Family History were reviewed and updated.  Review of Systems:  Review of  Systems  Constitutional:  Positive for malaise/fatigue and weight loss.  HENT: Negative.    Eyes: Negative.   Respiratory: Negative.    Cardiovascular: Negative.   Gastrointestinal: Negative.   Genitourinary: Negative.   Musculoskeletal: Negative.   Skin: Negative.   Neurological: Negative.   Endo/Heme/Allergies: Negative.   Psychiatric/Behavioral: Negative.        Physical Exam:  Vital signs show temperature of  97.8.  Pulse 61.  Blood pressure 124/52.  Weight is 168 pounds     Wt Readings from Last 3 Encounters:  08/11/23 168 lb (76.2 kg)  07/12/23 169 lb (76.7 kg)  07/04/23 168 lb 1.6 oz (76.2 kg)    Physical Exam Vitals reviewed.  HENT:     Head: Normocephalic and atraumatic.  Eyes:     Pupils: Pupils are equal, round, and reactive to light.  Cardiovascular:     Rate and Rhythm: Normal rate and regular rhythm.     Heart sounds: Normal heart sounds.  Pulmonary:     Effort: Pulmonary effort is normal.     Breath sounds: Normal breath sounds.  Abdominal:     General: Bowel sounds are normal.     Palpations: Abdomen is soft.  Musculoskeletal:        General: No tenderness or deformity. Normal range of motion.     Cervical back: Normal range of motion.  Lymphadenopathy:     Cervical: No cervical adenopathy.  Skin:    General: Skin is warm and dry.     Findings: No erythema or rash.  Neurological:     Mental Status: He is alert and oriented to person, place, and time.  Psychiatric:        Behavior: Behavior normal.        Thought Content: Thought content normal.        Judgment: Judgment normal.      Lab Results  Component Value Date   WBC 6.8 08/11/2023   HGB 13.5 08/11/2023   HCT 42.9 08/11/2023   MCV 89.7 08/11/2023   PLT 183 08/11/2023   Lab Results  Component Value Date   FERRITIN 199 07/12/2023   IRON  77 07/12/2023   TIBC 253 07/12/2023   UIBC 176 07/12/2023   IRONPCTSAT 30 07/12/2023   Lab Results  Component Value Date   RETICCTPCT  0.9 06/14/2023   RBC 4.78 08/11/2023   Lab Results  Component Value Date   KPAFRELGTCHN 35.3 (H) 07/12/2023   LAMBDASER 13.1 07/12/2023   KAPLAMBRATIO 2.69 (H) 07/12/2023   Lab Results  Component Value Date   IGGSERUM 574 (L) 07/12/2023   IGA 134 07/12/2023   IGMSERUM 35 07/12/2023   Lab Results  Component Value Date  TOTALPROTELP 5.8 (L) 07/12/2023   ALBUMINELP 3.4 07/12/2023   A1GS 0.2 07/12/2023   A2GS 0.7 07/12/2023   BETS 0.9 07/12/2023   BETA2SER 0.9 (H) 12/27/2014   GAMS 0.6 07/12/2023   MSPIKE 0.2 (H) 07/12/2023   SPEI Comment 01/28/2023     Chemistry      Component Value Date/Time   NA 141 08/11/2023 0955   NA 143 12/06/2016 1156   NA 139 08/02/2016 1146   K 5.0 08/11/2023 0955   K 4.2 12/06/2016 1156   K 4.7 08/02/2016 1146   CL 107 08/11/2023 0955   CL 107 12/06/2016 1156   CO2 24 08/11/2023 0955   CO2 28 12/06/2016 1156   CO2 24 08/02/2016 1146   BUN 22 08/11/2023 0955   BUN 29 (H) 12/06/2016 1156   BUN 34.3 (H) 08/02/2016 1146   CREATININE 1.43 (H) 08/11/2023 0955   CREATININE 2.1 (H) 12/06/2016 1156   CREATININE 1.7 (H) 08/02/2016 1146      Component Value Date/Time   CALCIUM 9.3 08/11/2023 0955   CALCIUM 9.1 12/06/2016 1156   CALCIUM 9.5 08/02/2016 1146   ALKPHOS 130 (H) 08/11/2023 0955   ALKPHOS 108 (H) 12/06/2016 1156   ALKPHOS 115 08/02/2016 1146   AST 16 08/11/2023 0955   AST 14 08/02/2016 1146   ALT 10 08/11/2023 0955   ALT 18 12/06/2016 1156   ALT 9 08/02/2016 1146   BILITOT 0.3 08/11/2023 0955   BILITOT 0.41 08/02/2016 1146       Impression and Plan: James Marks is 83 year old man with IgA kappa myeloma.  Currently, he is going on Faspro.  He really has done incredibly well to date.  Again, we will have to see what his light chain levels are.  Will see what the monoclonal spike is.  If we see that they are increasing, I probably will just add low-dose pomalidomide.  He will get his Faspro today.  I will plan to get him  back to see me in another month.   Maude JONELLE Crease, MD 7/31/202510:58 AM

## 2023-08-11 NOTE — Patient Instructions (Signed)
 [facility] - A DEPT OF [facility]  Discharge Instructions: Thank you for choosing [facility] to provide your oncology and hematology care.   If you have a lab appointment with the Cancer Center, please go directly to the Cancer Center and check in at the registration area.  Wear comfortable clothing and clothing appropriate for easy access to any Portacath or PICC line.   We strive to give you quality time with your provider. You may need to reschedule your appointment if you arrive late (15 or more minutes).  Arriving late affects you and other patients whose appointments are after yours.  Also, if you miss three or more appointments without notifying the office, you may be dismissed from the clinic at the provider's discretion.      For prescription refill requests, have your pharmacy contact our office and allow 72 hours for refills to be completed.    Today you received the following chemotherapy and/or immunotherapy agents faspro      To help prevent nausea and vomiting after your treatment, we encourage you to take your nausea medication as directed.  BELOW ARE SYMPTOMS THAT SHOULD BE REPORTED IMMEDIATELY: *FEVER GREATER THAN 100.4 F (38 C) OR HIGHER *CHILLS OR SWEATING *NAUSEA AND VOMITING THAT IS NOT CONTROLLED WITH YOUR NAUSEA MEDICATION *UNUSUAL SHORTNESS OF BREATH *UNUSUAL BRUISING OR BLEEDING *URINARY PROBLEMS (pain or burning when urinating, or frequent urination) *BOWEL PROBLEMS (unusual diarrhea, constipation, pain near the anus) TENDERNESS IN MOUTH AND THROAT WITH OR WITHOUT PRESENCE OF ULCERS (sore throat, sores in mouth, or a toothache) UNUSUAL RASH, SWELLING OR PAIN  UNUSUAL VAGINAL DISCHARGE OR ITCHING   Items with * indicate a potential emergency and should be followed up as soon as possible or go to the Emergency Department if any problems should occur.  Please show the CHEMOTHERAPY ALERT CARD or IMMUNOTHERAPY ALERT  CARD at check-in to the Emergency Department and triage nurse. Should you have questions after your visit or need to cancel or reschedule your appointment, please contact [facility] - A DEPT OF [facility]  [phone] and follow the prompts.  Office hours are 8:00 a.m. to 4:30 p.m. Monday - Friday. Please note that voicemails left after 4:00 p.m. may not be returned until the following business day.  We are closed weekends and major holidays. You have access to a nurse at all times for urgent questions. Please call the main number to the clinic [phone] and follow the prompts.  For any non-urgent questions, you may also contact your provider using MyChart. We now offer e-Visits for anyone 66 and older to request care online for non-urgent symptoms. For details visit [email or web address].   Also download the MyChart app! Go to the app store, search "MyChart", open the app, select , and log in with your MyChart username and password.

## 2023-08-12 ENCOUNTER — Ambulatory Visit: Payer: Self-pay | Admitting: Hematology & Oncology

## 2023-08-12 LAB — IGG, IGA, IGM
IgA: 137 mg/dL (ref 61–437)
IgG (Immunoglobin G), Serum: 606 mg/dL (ref 603–1613)
IgM (Immunoglobulin M), Srm: 34 mg/dL (ref 15–143)

## 2023-08-12 LAB — KAPPA/LAMBDA LIGHT CHAINS
Kappa free light chain: 31.1 mg/L — ABNORMAL HIGH (ref 3.3–19.4)
Kappa, lambda light chain ratio: 2.66 — ABNORMAL HIGH (ref 0.26–1.65)
Lambda free light chains: 11.7 mg/L (ref 5.7–26.3)

## 2023-08-18 LAB — IMMUNOFIXATION REFLEX, SERUM
IgA: 139 mg/dL (ref 61–437)
IgG (Immunoglobin G), Serum: 630 mg/dL (ref 603–1613)
IgM (Immunoglobulin M), Srm: 35 mg/dL (ref 15–143)

## 2023-08-18 LAB — PROTEIN ELECTROPHORESIS, SERUM, WITH REFLEX
A/G Ratio: 1.6 (ref 0.7–1.7)
Albumin ELP: 3.6 g/dL (ref 2.9–4.4)
Alpha-1-Globulin: 0.2 g/dL (ref 0.0–0.4)
Alpha-2-Globulin: 0.7 g/dL (ref 0.4–1.0)
Beta Globulin: 0.9 g/dL (ref 0.7–1.3)
Gamma Globulin: 0.4 g/dL (ref 0.4–1.8)
Globulin, Total: 2.2 g/dL (ref 2.2–3.9)
M-Spike, %: 0.1 g/dL — ABNORMAL HIGH
SPEP Interpretation: 0
Total Protein ELP: 5.8 g/dL — ABNORMAL LOW (ref 6.0–8.5)

## 2023-08-19 ENCOUNTER — Other Ambulatory Visit: Payer: Self-pay | Admitting: Hematology & Oncology

## 2023-08-19 DIAGNOSIS — D472 Monoclonal gammopathy: Secondary | ICD-10-CM

## 2023-08-19 DIAGNOSIS — Z789 Other specified health status: Secondary | ICD-10-CM

## 2023-08-20 ENCOUNTER — Other Ambulatory Visit: Payer: Self-pay

## 2023-09-08 ENCOUNTER — Other Ambulatory Visit: Payer: Self-pay

## 2023-09-08 ENCOUNTER — Encounter: Payer: Self-pay | Admitting: Hematology & Oncology

## 2023-09-08 ENCOUNTER — Inpatient Hospital Stay (HOSPITAL_BASED_OUTPATIENT_CLINIC_OR_DEPARTMENT_OTHER): Admitting: Hematology & Oncology

## 2023-09-08 ENCOUNTER — Inpatient Hospital Stay: Attending: Hematology & Oncology

## 2023-09-08 ENCOUNTER — Inpatient Hospital Stay

## 2023-09-08 VITALS — BP 129/56 | HR 63 | Temp 97.9°F | Resp 18 | Ht 74.0 in | Wt 171.0 lb

## 2023-09-08 DIAGNOSIS — C9000 Multiple myeloma not having achieved remission: Secondary | ICD-10-CM | POA: Diagnosis present

## 2023-09-08 DIAGNOSIS — D472 Monoclonal gammopathy: Secondary | ICD-10-CM

## 2023-09-08 DIAGNOSIS — Z5112 Encounter for antineoplastic immunotherapy: Secondary | ICD-10-CM | POA: Insufficient documentation

## 2023-09-08 LAB — IRON AND IRON BINDING CAPACITY (CC-WL,HP ONLY)
Iron: 63 ug/dL (ref 45–182)
Saturation Ratios: 22 % (ref 17.9–39.5)
TIBC: 290 ug/dL (ref 250–450)
UIBC: 227 ug/dL

## 2023-09-08 LAB — CMP (CANCER CENTER ONLY)
ALT: 11 U/L (ref 0–44)
AST: 17 U/L (ref 15–41)
Albumin: 4.3 g/dL (ref 3.5–5.0)
Alkaline Phosphatase: 145 U/L — ABNORMAL HIGH (ref 38–126)
Anion gap: 11 (ref 5–15)
BUN: 23 mg/dL (ref 8–23)
CO2: 26 mmol/L (ref 22–32)
Calcium: 8.9 mg/dL (ref 8.9–10.3)
Chloride: 106 mmol/L (ref 98–111)
Creatinine: 1.47 mg/dL — ABNORMAL HIGH (ref 0.61–1.24)
GFR, Estimated: 47 mL/min — ABNORMAL LOW (ref >60)
Glucose, Bld: 128 mg/dL — ABNORMAL HIGH (ref 70–99)
Potassium: 4.4 mmol/L (ref 3.5–5.1)
Sodium: 143 mmol/L (ref 135–145)
Total Bilirubin: 0.3 mg/dL (ref 0.0–1.2)
Total Protein: 6.7 g/dL (ref 6.5–8.1)

## 2023-09-08 LAB — CBC WITH DIFFERENTIAL (CANCER CENTER ONLY)
Abs Immature Granulocytes: 0.05 K/uL (ref 0.00–0.07)
Basophils Absolute: 0 K/uL (ref 0.0–0.1)
Basophils Relative: 0 %
Eosinophils Absolute: 0.1 K/uL (ref 0.0–0.5)
Eosinophils Relative: 1 %
HCT: 41.3 % (ref 39.0–52.0)
Hemoglobin: 13.4 g/dL (ref 13.0–17.0)
Immature Granulocytes: 1 %
Lymphocytes Relative: 14 %
Lymphs Abs: 1.2 K/uL (ref 0.7–4.0)
MCH: 29 pg (ref 26.0–34.0)
MCHC: 32.4 g/dL (ref 30.0–36.0)
MCV: 89.4 fL (ref 80.0–100.0)
Monocytes Absolute: 0.5 K/uL (ref 0.1–1.0)
Monocytes Relative: 5 %
Neutro Abs: 7 K/uL (ref 1.7–7.7)
Neutrophils Relative %: 79 %
Platelet Count: 186 K/uL (ref 150–400)
RBC: 4.62 MIL/uL (ref 4.22–5.81)
RDW: 14.1 % (ref 11.5–15.5)
WBC Count: 8.9 K/uL (ref 4.0–10.5)
nRBC: 0 % (ref 0.0–0.2)

## 2023-09-08 LAB — FERRITIN: Ferritin: 268 ng/mL (ref 24–336)

## 2023-09-08 LAB — LACTATE DEHYDROGENASE: LDH: 141 U/L (ref 98–192)

## 2023-09-08 MED ORDER — DIPHENHYDRAMINE HCL 25 MG PO CAPS: 50.0000 mg | ORAL_CAPSULE | Freq: Once | ORAL | Status: DC

## 2023-09-08 MED ORDER — DEXAMETHASONE 4 MG PO TABS: 20.0000 mg | ORAL_TABLET | Freq: Once | ORAL | Status: DC

## 2023-09-08 MED ORDER — DARATUMUMAB-HYALURONIDASE-FIHJ 1800-30000 MG-UT/15ML ~~LOC~~ SOLN
1800.0000 mg | Freq: Once | SUBCUTANEOUS | Status: AC
  Administered 2023-09-08: 1800 mg via SUBCUTANEOUS
  Filled 2023-09-08: qty 15

## 2023-09-08 MED ORDER — ACETAMINOPHEN 325 MG PO TABS
650.0000 mg | ORAL_TABLET | Freq: Once | ORAL | Status: DC
Start: 2023-09-08 — End: 2023-09-08

## 2023-09-08 NOTE — Progress Notes (Signed)
 Hematology and Oncology Follow Up Visit  DARIUSZ BRASE [id number] 05/23/1940 83 y.o. 09/08/2023   Principle Diagnosis:  IgA kappa  myeloma Iron  deficiency anemia Erythropoietin  deficiency anemia  Past Therapy: Velcade /Revlimid /Decadron  -- s/p cycle #6 -- start on 07/27/2021   Current Therapy:        Faspro/Velcade /Revlimid /Decadron  -- s/p cycle #20 - start  on 01/28/2022 --Revlimid   DC'd on 02/24/2022 - Velcade  d/c on 12/01/2022 Venofer  300 mg IV given on 04/06/2023 Aranesp 300 mcg subcu q. 4 weeks.   Interim History:  Mr. Stieg is here today for follow-up and Faspro.  Thankfully, his last monoclonal spike was 0.1 g/dL.  I am very happy about this.  The Kappa light chain was 3.1 mg/dL.  The IgA a level was 138 mg/dL.  He is still doing quite well.  He is active.  He will play golf next week.  His appetite is okay.  He has had no problems with nausea or vomiting.  He has not smoking.  He has had no change in bowel or bladder habits.  He has had no rashes.  There has been no leg swelling.  He has had no headache.  His last iron  studies that we did on him showed a ferritin of 199 with an iron  saturation of 30%.  He actually responded very nicely to Aranesp.  I do not think we have had to give him this for a while.  Overall, I would say that his performance status is probably ECOG 1.   Medications:  Allergies as of 09/08/2023   No Known Allergies      Medication List        Accurate as of September 08, 2023 11:24 AM. If you have any questions, ask your nurse or doctor.          acetaminophen  325 MG tablet Commonly known as: TYLENOL  Take 325 mg by mouth. 02/10/2022 Takes 325 mg one hour prior to antibody treatment.   acyclovir  400 MG tablet Commonly known as: ZOVIRAX  TAKE 1 TABLET(400 MG) BY MOUTH TWICE DAILY   amLODipine  2.5 MG tablet Commonly known as: NORVASC  Take 2.5 mg by mouth daily.   amoxicillin  500 MG capsule Commonly known as: AMOXIL  Take four tablets  (2000 mg) total one hour prior to dental procedure.   Aspirin 81 MG Caps Take 81 mg by mouth daily.   dexamethasone  4 MG tablet Commonly known as: DECADRON  TAKE 5 TABLETS BY MOUTH 1 HOUR PRIOR TO CHEMO TREATMENT   diphenhydrAMINE  25 mg capsule Commonly known as: BENADRYL  Take 50 mg by mouth. 02/10/2022 Takes one hour prior to antibody treatment.   gabapentin  300 MG capsule Commonly known as: NEURONTIN  TAKE 1 CAPSULE BY MOUTH EVERY DAY WITH BREAKFAST, 1 CAPSULE WITH DINNER AND 2 CAPSULES AT BEDTIME   losartan  50 MG tablet Commonly known as: COZAAR  Take 50 mg by mouth daily.   montelukast  10 MG tablet Commonly known as: Singulair  Take 1 tablet (10 mg total) by mouth at bedtime.   ondansetron  8 MG tablet Commonly known as: Zofran  Take 1 tablet (8 mg total) by mouth every 8 (eight) hours as needed for nausea or vomiting.   PRESERVISION AREDS 2 PO Take by mouth daily at 6 (six) AM.   prochlorperazine  10 MG tablet Commonly known as: COMPAZINE  Take 1 tablet (10 mg total) by mouth every 6 (six) hours as needed for nausea or vomiting.   traMADol  50 MG tablet Commonly known as: ULTRAM  Take 50 mg by mouth every 6 (six)  hours as needed.   traZODone  50 MG tablet Commonly known as: DESYREL  Take 1 tablet (50 mg total) by mouth at bedtime.        Allergies: No Known Allergies  Past Medical History, Surgical history, Social history, and Family History were reviewed and updated.  Review of Systems:  Review of Systems  Constitutional:  Positive for malaise/fatigue and weight loss.  HENT: Negative.    Eyes: Negative.   Respiratory: Negative.    Cardiovascular: Negative.   Gastrointestinal: Negative.   Genitourinary: Negative.   Musculoskeletal: Negative.   Skin: Negative.   Neurological: Negative.   Endo/Heme/Allergies: Negative.   Psychiatric/Behavioral: Negative.        Physical Exam:  Vital signs show temperature of 97.9.  Pulse 63.  Blood pressure 129/56.   Weight is 171 pounds     Wt Readings from Last 3 Encounters:  09/08/23 171 lb (77.6 kg)  08/11/23 168 lb (76.2 kg)  07/12/23 169 lb (76.7 kg)    Physical Exam Vitals reviewed.  HENT:     Head: Normocephalic and atraumatic.  Eyes:     Pupils: Pupils are equal, round, and reactive to light.  Cardiovascular:     Rate and Rhythm: Normal rate and regular rhythm.     Heart sounds: Normal heart sounds.  Pulmonary:     Effort: Pulmonary effort is normal.     Breath sounds: Normal breath sounds.  Abdominal:     General: Bowel sounds are normal.     Palpations: Abdomen is soft.  Musculoskeletal:        General: No tenderness or deformity. Normal range of motion.     Cervical back: Normal range of motion.  Lymphadenopathy:     Cervical: No cervical adenopathy.  Skin:    General: Skin is warm and dry.     Findings: No erythema or rash.  Neurological:     Mental Status: He is alert and oriented to person, place, and time.  Psychiatric:        Behavior: Behavior normal.        Thought Content: Thought content normal.        Judgment: Judgment normal.      Lab Results  Component Value Date   WBC 8.9 09/08/2023   HGB 13.4 09/08/2023   HCT 41.3 09/08/2023   MCV 89.4 09/08/2023   PLT 186 09/08/2023   Lab Results  Component Value Date   FERRITIN 199 07/12/2023   IRON  77 07/12/2023   TIBC 253 07/12/2023   UIBC 176 07/12/2023   IRONPCTSAT 30 07/12/2023   Lab Results  Component Value Date   RETICCTPCT 0.9 06/14/2023   RBC 4.62 09/08/2023   Lab Results  Component Value Date   KPAFRELGTCHN 31.1 (H) 08/11/2023   LAMBDASER 11.7 08/11/2023   KAPLAMBRATIO 2.66 (H) 08/11/2023   Lab Results  Component Value Date   IGGSERUM 606 08/11/2023   IGGSERUM 630 08/11/2023   IGA 137 08/11/2023   IGA 139 08/11/2023   IGMSERUM 34 08/11/2023   IGMSERUM 35 08/11/2023   Lab Results  Component Value Date   TOTALPROTELP 5.8 (L) 08/11/2023   ALBUMINELP 3.6 08/11/2023   A1GS 0.2  08/11/2023   A2GS 0.7 08/11/2023   BETS 0.9 08/11/2023   BETA2SER 0.9 (H) 12/27/2014   GAMS 0.4 08/11/2023   MSPIKE 0.1 (H) 08/11/2023   SPEI Comment 01/28/2023     Chemistry      Component Value Date/Time   NA 143 09/08/2023 1037  NA 143 12/06/2016 1156   NA 139 08/02/2016 1146   K 4.4 09/08/2023 1037   K 4.2 12/06/2016 1156   K 4.7 08/02/2016 1146   CL 106 09/08/2023 1037   CL 107 12/06/2016 1156   CO2 26 09/08/2023 1037   CO2 28 12/06/2016 1156   CO2 24 08/02/2016 1146   BUN 23 09/08/2023 1037   BUN 29 (H) 12/06/2016 1156   BUN 34.3 (H) 08/02/2016 1146   CREATININE 1.47 (H) 09/08/2023 1037   CREATININE 2.1 (H) 12/06/2016 1156   CREATININE 1.7 (H) 08/02/2016 1146      Component Value Date/Time   CALCIUM 8.9 09/08/2023 1037   CALCIUM 9.1 12/06/2016 1156   CALCIUM 9.5 08/02/2016 1146   ALKPHOS 145 (H) 09/08/2023 1037   ALKPHOS 108 (H) 12/06/2016 1156   ALKPHOS 115 08/02/2016 1146   AST 17 09/08/2023 1037   AST 14 08/02/2016 1146   ALT 11 09/08/2023 1037   ALT 18 12/06/2016 1156   ALT 9 08/02/2016 1146   BILITOT 0.3 09/08/2023 1037   BILITOT 0.41 08/02/2016 1146       Impression and Plan: Mr. Timson is 83 year old man with IgA kappa myeloma.  Currently, he is going on Faspro.  He really has done incredibly well to date.  I still think he is doing nicely.  As such, I really do not think that we have to make a change in his protocol.  I do not think we have to add anything right now.  Again, this is all about quality of life.  We will see him back in another month.   Maude JONELLE Crease, MD 8/28/202511:24 AM

## 2023-09-08 NOTE — Patient Instructions (Signed)
 [facility] - A DEPT OF [facility]  Discharge Instructions: Thank you for choosing [facility] to provide your oncology and hematology care.   If you have a lab appointment with the Cancer Center, please go directly to the Cancer Center and check in at the registration area.  Wear comfortable clothing and clothing appropriate for easy access to any Portacath or PICC line.   We strive to give you quality time with your provider. You may need to reschedule your appointment if you arrive late (15 or more minutes).  Arriving late affects you and other patients whose appointments are after yours.  Also, if you miss three or more appointments without notifying the office, you may be dismissed from the clinic at the provider's discretion.      For prescription refill requests, have your pharmacy contact our office and allow 72 hours for refills to be completed.    Today you received the following chemotherapy and/or immunotherapy agents Darzalex       To help prevent nausea and vomiting after your treatment, we encourage you to take your nausea medication as directed.  BELOW ARE SYMPTOMS THAT SHOULD BE REPORTED IMMEDIATELY: *FEVER GREATER THAN 100.4 F (38 C) OR HIGHER *CHILLS OR SWEATING *NAUSEA AND VOMITING THAT IS NOT CONTROLLED WITH YOUR NAUSEA MEDICATION *UNUSUAL SHORTNESS OF BREATH *UNUSUAL BRUISING OR BLEEDING *URINARY PROBLEMS (pain or burning when urinating, or frequent urination) *BOWEL PROBLEMS (unusual diarrhea, constipation, pain near the anus) TENDERNESS IN MOUTH AND THROAT WITH OR WITHOUT PRESENCE OF ULCERS (sore throat, sores in mouth, or a toothache) UNUSUAL RASH, SWELLING OR PAIN  UNUSUAL VAGINAL DISCHARGE OR ITCHING   Items with * indicate a potential emergency and should be followed up as soon as possible or go to the Emergency Department if any problems should occur.  Please show the CHEMOTHERAPY ALERT CARD or IMMUNOTHERAPY  ALERT CARD at check-in to the Emergency Department and triage nurse. Should you have questions after your visit or need to cancel or reschedule your appointment, please contact [facility] - A DEPT OF [facility]  [phone] and follow the prompts.  Office hours are 8:00 a.m. to 4:30 p.m. Monday - Friday. Please note that voicemails left after 4:00 p.m. may not be returned until the following business day.  We are closed weekends and major holidays. You have access to a nurse at all times for urgent questions. Please call the main number to the clinic [phone] and follow the prompts.  For any non-urgent questions, you may also contact your provider using MyChart. We now offer e-Visits for anyone 11 and older to request care online for non-urgent symptoms. For details visit [email or web address].   Also download the MyChart app! Go to the app store, search MyChart, open the app, select , and log in with your MyChart username and password.

## 2023-09-09 LAB — IGG, IGA, IGM
IgA: 126 mg/dL (ref 61–437)
IgG (Immunoglobin G), Serum: 596 mg/dL — ABNORMAL LOW (ref 603–1613)
IgM (Immunoglobulin M), Srm: 36 mg/dL (ref 15–143)

## 2023-09-09 LAB — KAPPA/LAMBDA LIGHT CHAINS
Kappa free light chain: 29.9 mg/L — ABNORMAL HIGH (ref 3.3–19.4)
Kappa, lambda light chain ratio: 3.65 — ABNORMAL HIGH (ref 0.26–1.65)
Lambda free light chains: 8.2 mg/L (ref 5.7–26.3)

## 2023-09-10 ENCOUNTER — Other Ambulatory Visit: Payer: Self-pay

## 2023-09-13 LAB — PROTEIN ELECTROPHORESIS, SERUM, WITH REFLEX
A/G Ratio: 1.4 (ref 0.7–1.7)
Albumin ELP: 3.6 g/dL (ref 2.9–4.4)
Alpha-1-Globulin: 0.2 g/dL (ref 0.0–0.4)
Alpha-2-Globulin: 0.8 g/dL (ref 0.4–1.0)
Beta Globulin: 0.9 g/dL (ref 0.7–1.3)
Gamma Globulin: 0.6 g/dL (ref 0.4–1.8)
Globulin, Total: 2.5 g/dL (ref 2.2–3.9)
Total Protein ELP: 6.1 g/dL (ref 6.0–8.5)

## 2023-09-14 ENCOUNTER — Other Ambulatory Visit: Payer: Self-pay

## 2023-10-03 ENCOUNTER — Other Ambulatory Visit: Payer: Self-pay

## 2023-10-06 ENCOUNTER — Encounter: Payer: Self-pay | Admitting: Hematology & Oncology

## 2023-10-06 ENCOUNTER — Inpatient Hospital Stay

## 2023-10-06 ENCOUNTER — Inpatient Hospital Stay (HOSPITAL_BASED_OUTPATIENT_CLINIC_OR_DEPARTMENT_OTHER): Admitting: Hematology & Oncology

## 2023-10-06 ENCOUNTER — Inpatient Hospital Stay: Attending: Hematology & Oncology

## 2023-10-06 VITALS — BP 130/58 | HR 60 | Temp 97.7°F | Resp 20 | Ht 74.0 in | Wt 170.0 lb

## 2023-10-06 DIAGNOSIS — D472 Monoclonal gammopathy: Secondary | ICD-10-CM

## 2023-10-06 DIAGNOSIS — R5383 Other fatigue: Secondary | ICD-10-CM | POA: Insufficient documentation

## 2023-10-06 DIAGNOSIS — Z5112 Encounter for antineoplastic immunotherapy: Secondary | ICD-10-CM | POA: Diagnosis present

## 2023-10-06 DIAGNOSIS — C9000 Multiple myeloma not having achieved remission: Secondary | ICD-10-CM | POA: Insufficient documentation

## 2023-10-06 DIAGNOSIS — D509 Iron deficiency anemia, unspecified: Secondary | ICD-10-CM | POA: Insufficient documentation

## 2023-10-06 DIAGNOSIS — D508 Other iron deficiency anemias: Secondary | ICD-10-CM

## 2023-10-06 LAB — CBC WITH DIFFERENTIAL (CANCER CENTER ONLY)
Abs Immature Granulocytes: 0.03 K/uL (ref 0.00–0.07)
Basophils Absolute: 0.1 K/uL (ref 0.0–0.1)
Basophils Relative: 1 %
Eosinophils Absolute: 0.2 K/uL (ref 0.0–0.5)
Eosinophils Relative: 3 %
HCT: 39.8 % (ref 39.0–52.0)
Hemoglobin: 12.8 g/dL — ABNORMAL LOW (ref 13.0–17.0)
Immature Granulocytes: 0 %
Lymphocytes Relative: 21 %
Lymphs Abs: 1.5 K/uL (ref 0.7–4.0)
MCH: 29.2 pg (ref 26.0–34.0)
MCHC: 32.2 g/dL (ref 30.0–36.0)
MCV: 90.9 fL (ref 80.0–100.0)
Monocytes Absolute: 0.4 K/uL (ref 0.1–1.0)
Monocytes Relative: 6 %
Neutro Abs: 4.8 K/uL (ref 1.7–7.7)
Neutrophils Relative %: 69 %
Platelet Count: 197 K/uL (ref 150–400)
RBC: 4.38 MIL/uL (ref 4.22–5.81)
RDW: 14.5 % (ref 11.5–15.5)
WBC Count: 6.9 K/uL (ref 4.0–10.5)
nRBC: 0 % (ref 0.0–0.2)

## 2023-10-06 LAB — CMP (CANCER CENTER ONLY)
ALT: 9 U/L (ref 0–44)
AST: 18 U/L (ref 15–41)
Albumin: 4.3 g/dL (ref 3.5–5.0)
Alkaline Phosphatase: 126 U/L (ref 38–126)
Anion gap: 11 (ref 5–15)
BUN: 19 mg/dL (ref 8–23)
CO2: 26 mmol/L (ref 22–32)
Calcium: 9.2 mg/dL (ref 8.9–10.3)
Chloride: 106 mmol/L (ref 98–111)
Creatinine: 1.58 mg/dL — ABNORMAL HIGH (ref 0.61–1.24)
GFR, Estimated: 43 mL/min — ABNORMAL LOW (ref >60)
Glucose, Bld: 143 mg/dL — ABNORMAL HIGH (ref 70–99)
Potassium: 4.7 mmol/L (ref 3.5–5.1)
Sodium: 143 mmol/L (ref 135–145)
Total Bilirubin: 0.4 mg/dL (ref 0.0–1.2)
Total Protein: 6.5 g/dL (ref 6.5–8.1)

## 2023-10-06 MED ORDER — DEXAMETHASONE 4 MG PO TABS: 20.0000 mg | ORAL_TABLET | Freq: Once | ORAL | Status: DC

## 2023-10-06 MED ORDER — ACETAMINOPHEN 325 MG PO TABS: 650.0000 mg | ORAL_TABLET | Freq: Once | ORAL | Status: DC

## 2023-10-06 MED ORDER — DARBEPOETIN ALFA 300 MCG/0.6ML IJ SOSY: 300.0000 ug | PREFILLED_SYRINGE | Freq: Once | INTRAMUSCULAR | Status: DC

## 2023-10-06 MED ORDER — DARATUMUMAB-HYALURONIDASE-FIHJ 1800-30000 MG-UT/15ML ~~LOC~~ SOLN
1800.0000 mg | Freq: Once | SUBCUTANEOUS | Status: AC
  Administered 2023-10-06: 1800 mg via SUBCUTANEOUS
  Filled 2023-10-06: qty 15

## 2023-10-06 MED ORDER — DIPHENHYDRAMINE HCL 25 MG PO CAPS: 50.0000 mg | ORAL_CAPSULE | Freq: Once | ORAL | Status: DC

## 2023-10-06 NOTE — Patient Instructions (Signed)
 [facility] - A DEPT OF [facility]  Discharge Instructions: Thank you for choosing [facility] to provide your oncology and hematology care.   If you have a lab appointment with the Cancer Center, please go directly to the Cancer Center and check in at the registration area.  Wear comfortable clothing and clothing appropriate for easy access to any Portacath or PICC line.   We strive to give you quality time with your provider. You may need to reschedule your appointment if you arrive late (15 or more minutes).  Arriving late affects you and other patients whose appointments are after yours.  Also, if you miss three or more appointments without notifying the office, you may be dismissed from the clinic at the provider's discretion.      For prescription refill requests, have your pharmacy contact our office and allow 72 hours for refills to be completed.    Today you received the following chemotherapy and/or immunotherapy agents Darzalex       To help prevent nausea and vomiting after your treatment, we encourage you to take your nausea medication as directed.  BELOW ARE SYMPTOMS THAT SHOULD BE REPORTED IMMEDIATELY: *FEVER GREATER THAN 100.4 F (38 C) OR HIGHER *CHILLS OR SWEATING *NAUSEA AND VOMITING THAT IS NOT CONTROLLED WITH YOUR NAUSEA MEDICATION *UNUSUAL SHORTNESS OF BREATH *UNUSUAL BRUISING OR BLEEDING *URINARY PROBLEMS (pain or burning when urinating, or frequent urination) *BOWEL PROBLEMS (unusual diarrhea, constipation, pain near the anus) TENDERNESS IN MOUTH AND THROAT WITH OR WITHOUT PRESENCE OF ULCERS (sore throat, sores in mouth, or a toothache) UNUSUAL RASH, SWELLING OR PAIN  UNUSUAL VAGINAL DISCHARGE OR ITCHING   Items with * indicate a potential emergency and should be followed up as soon as possible or go to the Emergency Department if any problems should occur.  Please show the CHEMOTHERAPY ALERT CARD or IMMUNOTHERAPY  ALERT CARD at check-in to the Emergency Department and triage nurse. Should you have questions after your visit or need to cancel or reschedule your appointment, please contact [facility] - A DEPT OF [facility]  [phone] and follow the prompts.  Office hours are 8:00 a.m. to 4:30 p.m. Monday - Friday. Please note that voicemails left after 4:00 p.m. may not be returned until the following business day.  We are closed weekends and major holidays. You have access to a nurse at all times for urgent questions. Please call the main number to the clinic [phone] and follow the prompts.  For any non-urgent questions, you may also contact your provider using MyChart. We now offer e-Visits for anyone 11 and older to request care online for non-urgent symptoms. For details visit [email or web address].   Also download the MyChart app! Go to the app store, search MyChart, open the app, select , and log in with your MyChart username and password.

## 2023-10-06 NOTE — Progress Notes (Signed)
 Hematology and Oncology Follow Up Visit  James Marks [id number] 1940-08-23 83 y.o. 10/06/2023   Principle Diagnosis:  IgA kappa  myeloma Iron  deficiency anemia Erythropoietin  deficiency anemia  Past Therapy: Velcade /Revlimid /Decadron  -- s/p cycle #6 -- start on 07/27/2021   Current Therapy:        Faspro/Velcade /Revlimid /Decadron  -- s/p cycle #20 - start  on 01/28/2022 --Revlimid   DC'd on 02/24/2022 - Velcade  d/c on 12/01/2022 Venofer  300 mg IV given on 04/06/2023 Aranesp  300 mcg subcu q. 4 weeks.   Interim History:  James Marks is here today for follow-up and Faspro.  Thankfully, his last monoclonal spike was not detectable.  His kappa light chain was down to 3 mg/dL.  The IgA level was 126 mg/dL.  He played golf a couple days ago.  He is very happy about this.  He has had no problems with nausea or vomiting.  He has had no problems with cough or shortness of breath.  He is not smoking.  He has had no change in bowel or bladder habits.  He has had no rashes.  He has had no bleeding.  He has had no headache.  His iron  studies back in August showed a ferritin of 268 with an iron  saturation of 22%.  Overall, I will say that his performance status is ECOG 1.     Medications:  Allergies as of 10/06/2023   No Known Allergies      Medication List        Accurate as of October 06, 2023 12:35 PM. If you have any questions, ask your nurse or doctor.          acetaminophen  325 MG tablet Commonly known as: TYLENOL  Take 325 mg by mouth. 02/10/2022 Takes 325 mg one hour prior to antibody treatment.   acyclovir  400 MG tablet Commonly known as: ZOVIRAX  TAKE 1 TABLET(400 MG) BY MOUTH TWICE DAILY   amLODipine  2.5 MG tablet Commonly known as: NORVASC  Take 2.5 mg by mouth daily.   amoxicillin  500 MG capsule Commonly known as: AMOXIL  Take four tablets (2000 mg) total one hour prior to dental procedure.   Aspirin 81 MG Caps Take 81 mg by mouth daily.   dexamethasone  4  MG tablet Commonly known as: DECADRON  TAKE 5 TABLETS BY MOUTH 1 HOUR PRIOR TO CHEMO TREATMENT   diphenhydrAMINE  25 mg capsule Commonly known as: BENADRYL  Take 50 mg by mouth. 02/10/2022 Takes one hour prior to antibody treatment.   gabapentin  300 MG capsule Commonly known as: NEURONTIN  TAKE 1 CAPSULE BY MOUTH EVERY DAY WITH BREAKFAST, 1 CAPSULE WITH DINNER AND 2 CAPSULES AT BEDTIME What changed:  how much to take additional instructions   losartan  50 MG tablet Commonly known as: COZAAR  Take 50 mg by mouth daily.   montelukast  10 MG tablet Commonly known as: Singulair  Take 1 tablet (10 mg total) by mouth at bedtime.   ondansetron  8 MG tablet Commonly known as: Zofran  Take 1 tablet (8 mg total) by mouth every 8 (eight) hours as needed for nausea or vomiting.   PRESERVISION AREDS 2 PO Take by mouth daily at 6 (six) AM.   prochlorperazine  10 MG tablet Commonly known as: COMPAZINE  Take 1 tablet (10 mg total) by mouth every 6 (six) hours as needed for nausea or vomiting.   traMADol  50 MG tablet Commonly known as: ULTRAM  Take 50 mg by mouth every 6 (six) hours as needed.   traZODone  50 MG tablet Commonly known as: DESYREL  Take 1 tablet (50 mg total)  by mouth at bedtime.        Allergies: No Known Allergies  Past Medical History, Surgical history, Social history, and Family History were reviewed and updated.  Review of Systems:  Review of Systems  Constitutional:  Positive for malaise/fatigue and weight loss.  HENT: Negative.    Eyes: Negative.   Respiratory: Negative.    Cardiovascular: Negative.   Gastrointestinal: Negative.   Genitourinary: Negative.   Musculoskeletal: Negative.   Skin: Negative.   Neurological: Negative.   Endo/Heme/Allergies: Negative.   Psychiatric/Behavioral: Negative.        Physical Exam:  Vital signs show temperature of 97.7.  Pulse 60.  Blood pressure 130/58.  Weight is 170 pounds.     Wt Readings from Last 3 Encounters:   10/06/23 170 lb (77.1 kg)  09/08/23 171 lb (77.6 kg)  08/11/23 168 lb (76.2 kg)    Physical Exam Vitals reviewed.  HENT:     Head: Normocephalic and atraumatic.  Eyes:     Pupils: Pupils are equal, round, and reactive to light.  Cardiovascular:     Rate and Rhythm: Normal rate and regular rhythm.     Heart sounds: Normal heart sounds.  Pulmonary:     Effort: Pulmonary effort is normal.     Breath sounds: Normal breath sounds.  Abdominal:     General: Bowel sounds are normal.     Palpations: Abdomen is soft.  Musculoskeletal:        General: No tenderness or deformity. Normal range of motion.     Cervical back: Normal range of motion.  Lymphadenopathy:     Cervical: No cervical adenopathy.  Skin:    General: Skin is warm and dry.     Findings: No erythema or rash.  Neurological:     Mental Status: He is alert and oriented to person, place, and time.  Psychiatric:        Behavior: Behavior normal.        Thought Content: Thought content normal.        Judgment: Judgment normal.      Lab Results  Component Value Date   WBC 6.9 10/06/2023   HGB 12.8 (L) 10/06/2023   HCT 39.8 10/06/2023   MCV 90.9 10/06/2023   PLT 197 10/06/2023   Lab Results  Component Value Date   FERRITIN 268 09/08/2023   IRON  63 09/08/2023   TIBC 290 09/08/2023   UIBC 227 09/08/2023   IRONPCTSAT 22 09/08/2023   Lab Results  Component Value Date   RETICCTPCT 0.9 06/14/2023   RBC 4.38 10/06/2023   Lab Results  Component Value Date   KPAFRELGTCHN 29.9 (H) 09/08/2023   LAMBDASER 8.2 09/08/2023   KAPLAMBRATIO 3.65 (H) 09/08/2023   Lab Results  Component Value Date   IGGSERUM 596 (L) 09/08/2023   IGA 126 09/08/2023   IGMSERUM 36 09/08/2023   Lab Results  Component Value Date   TOTALPROTELP 6.1 09/08/2023   ALBUMINELP 3.6 09/08/2023   A1GS 0.2 09/08/2023   A2GS 0.8 09/08/2023   BETS 0.9 09/08/2023   BETA2SER 0.9 (H) 12/27/2014   GAMS 0.6 09/08/2023   MSPIKE Not Observed  09/08/2023   SPEI Comment 01/28/2023     Chemistry      Component Value Date/Time   NA 143 10/06/2023 1141   NA 143 12/06/2016 1156   NA 139 08/02/2016 1146   K 4.7 10/06/2023 1141   K 4.2 12/06/2016 1156   K 4.7 08/02/2016 1146   CL 106 10/06/2023  1141   CL 107 12/06/2016 1156   CO2 26 10/06/2023 1141   CO2 28 12/06/2016 1156   CO2 24 08/02/2016 1146   BUN 19 10/06/2023 1141   BUN 29 (H) 12/06/2016 1156   BUN 34.3 (H) 08/02/2016 1146   CREATININE 1.58 (H) 10/06/2023 1141   CREATININE 2.1 (H) 12/06/2016 1156   CREATININE 1.7 (H) 08/02/2016 1146      Component Value Date/Time   CALCIUM 9.2 10/06/2023 1141   CALCIUM 9.1 12/06/2016 1156   CALCIUM 9.5 08/02/2016 1146   ALKPHOS 126 10/06/2023 1141   ALKPHOS 108 (H) 12/06/2016 1156   ALKPHOS 115 08/02/2016 1146   AST 18 10/06/2023 1141   AST 14 08/02/2016 1146   ALT 9 10/06/2023 1141   ALT 18 12/06/2016 1156   ALT 9 08/02/2016 1146   BILITOT 0.4 10/06/2023 1141   BILITOT 0.41 08/02/2016 1146       Impression and Plan: Mr. Jarnigan is 83 year old man with IgA kappa myeloma.  Currently, he is going on Faspro.  He really has done incredibly well to date.  I still think he is doing nicely.  As such, I really do not think that we have to make a change in his protocol.  I do not think we have to add anything right now.  Again, this is all about quality of life.  We will see him back in another month.   Maude JONELLE Crease, MD 9/25/202512:35 PM

## 2023-10-07 LAB — IGG, IGA, IGM
IgA: 126 mg/dL (ref 61–437)
IgG (Immunoglobin G), Serum: 587 mg/dL — ABNORMAL LOW (ref 603–1613)
IgM (Immunoglobulin M), Srm: 31 mg/dL (ref 15–143)

## 2023-10-07 LAB — KAPPA/LAMBDA LIGHT CHAINS
Kappa free light chain: 29.2 mg/L — ABNORMAL HIGH (ref 3.3–19.4)
Kappa, lambda light chain ratio: 2.5 — ABNORMAL HIGH (ref 0.26–1.65)
Lambda free light chains: 11.7 mg/L (ref 5.7–26.3)

## 2023-10-09 ENCOUNTER — Other Ambulatory Visit: Payer: Self-pay

## 2023-10-10 ENCOUNTER — Ambulatory Visit: Payer: Self-pay | Admitting: Hematology & Oncology

## 2023-10-10 LAB — PROTEIN ELECTROPHORESIS, SERUM, WITH REFLEX
A/G Ratio: 1.6 (ref 0.7–1.7)
Albumin ELP: 3.6 g/dL (ref 2.9–4.4)
Alpha-1-Globulin: 0.2 g/dL (ref 0.0–0.4)
Alpha-2-Globulin: 0.7 g/dL (ref 0.4–1.0)
Beta Globulin: 0.9 g/dL (ref 0.7–1.3)
Gamma Globulin: 0.5 g/dL (ref 0.4–1.8)
Globulin, Total: 2.3 g/dL (ref 2.2–3.9)
Total Protein ELP: 5.9 g/dL — ABNORMAL LOW (ref 6.0–8.5)

## 2023-10-14 ENCOUNTER — Other Ambulatory Visit: Payer: Self-pay

## 2023-10-25 ENCOUNTER — Other Ambulatory Visit: Payer: Self-pay | Admitting: Family Medicine

## 2023-10-28 ENCOUNTER — Other Ambulatory Visit: Payer: Self-pay | Admitting: Hematology & Oncology

## 2023-10-29 ENCOUNTER — Encounter: Payer: Self-pay | Admitting: Hematology & Oncology

## 2023-11-03 ENCOUNTER — Inpatient Hospital Stay

## 2023-11-03 ENCOUNTER — Encounter: Payer: Self-pay | Admitting: Medical Oncology

## 2023-11-03 ENCOUNTER — Inpatient Hospital Stay: Admitting: Medical Oncology

## 2023-11-03 ENCOUNTER — Inpatient Hospital Stay: Attending: Hematology & Oncology

## 2023-11-03 ENCOUNTER — Encounter: Payer: Self-pay | Admitting: Hematology & Oncology

## 2023-11-03 VITALS — BP 114/53 | HR 65 | Temp 97.7°F | Resp 18 | Ht 74.0 in | Wt 171.8 lb

## 2023-11-03 VITALS — BP 131/70 | HR 56 | Resp 18

## 2023-11-03 DIAGNOSIS — D508 Other iron deficiency anemias: Secondary | ICD-10-CM | POA: Diagnosis not present

## 2023-11-03 DIAGNOSIS — Z789 Other specified health status: Secondary | ICD-10-CM

## 2023-11-03 DIAGNOSIS — D472 Monoclonal gammopathy: Secondary | ICD-10-CM

## 2023-11-03 DIAGNOSIS — R5383 Other fatigue: Secondary | ICD-10-CM | POA: Diagnosis not present

## 2023-11-03 DIAGNOSIS — C9000 Multiple myeloma not having achieved remission: Secondary | ICD-10-CM | POA: Diagnosis present

## 2023-11-03 DIAGNOSIS — Z5112 Encounter for antineoplastic immunotherapy: Secondary | ICD-10-CM | POA: Insufficient documentation

## 2023-11-03 DIAGNOSIS — D631 Anemia in chronic kidney disease: Secondary | ICD-10-CM | POA: Diagnosis not present

## 2023-11-03 DIAGNOSIS — D509 Iron deficiency anemia, unspecified: Secondary | ICD-10-CM | POA: Diagnosis not present

## 2023-11-03 LAB — CMP (CANCER CENTER ONLY)
ALT: 8 U/L (ref 0–44)
AST: 17 U/L (ref 15–41)
Albumin: 4.3 g/dL (ref 3.5–5.0)
Alkaline Phosphatase: 126 U/L (ref 38–126)
Anion gap: 10 (ref 5–15)
BUN: 20 mg/dL (ref 8–23)
CO2: 27 mmol/L (ref 22–32)
Calcium: 9.2 mg/dL (ref 8.9–10.3)
Chloride: 107 mmol/L (ref 98–111)
Creatinine: 1.49 mg/dL — ABNORMAL HIGH (ref 0.61–1.24)
GFR, Estimated: 46 mL/min — ABNORMAL LOW (ref >60)
Glucose, Bld: 126 mg/dL — ABNORMAL HIGH (ref 70–99)
Potassium: 4.8 mmol/L (ref 3.5–5.1)
Sodium: 143 mmol/L (ref 135–145)
Total Bilirubin: 0.3 mg/dL (ref 0.0–1.2)
Total Protein: 6.3 g/dL — ABNORMAL LOW (ref 6.5–8.1)

## 2023-11-03 LAB — IRON AND IRON BINDING CAPACITY (CC-WL,HP ONLY)
Iron: 76 ug/dL (ref 45–182)
Saturation Ratios: 25 % (ref 17.9–39.5)
TIBC: 305 ug/dL (ref 250–450)
UIBC: 229 ug/dL

## 2023-11-03 LAB — LACTATE DEHYDROGENASE: LDH: 139 U/L (ref 98–192)

## 2023-11-03 LAB — CBC WITH DIFFERENTIAL (CANCER CENTER ONLY)
Abs Immature Granulocytes: 0.02 K/uL (ref 0.00–0.07)
Basophils Absolute: 0 K/uL (ref 0.0–0.1)
Basophils Relative: 1 %
Eosinophils Absolute: 0.1 K/uL (ref 0.0–0.5)
Eosinophils Relative: 2 %
HCT: 40.1 % (ref 39.0–52.0)
Hemoglobin: 12.8 g/dL — ABNORMAL LOW (ref 13.0–17.0)
Immature Granulocytes: 0 %
Lymphocytes Relative: 20 %
Lymphs Abs: 1.2 K/uL (ref 0.7–4.0)
MCH: 29.4 pg (ref 26.0–34.0)
MCHC: 31.9 g/dL (ref 30.0–36.0)
MCV: 92 fL (ref 80.0–100.0)
Monocytes Absolute: 0.3 K/uL (ref 0.1–1.0)
Monocytes Relative: 5 %
Neutro Abs: 4.3 K/uL (ref 1.7–7.7)
Neutrophils Relative %: 72 %
Platelet Count: 194 K/uL (ref 150–400)
RBC: 4.36 MIL/uL (ref 4.22–5.81)
RDW: 14.1 % (ref 11.5–15.5)
WBC Count: 6 K/uL (ref 4.0–10.5)
nRBC: 0 % (ref 0.0–0.2)

## 2023-11-03 LAB — FERRITIN: Ferritin: 236 ng/mL (ref 24–336)

## 2023-11-03 MED ORDER — DEXAMETHASONE 4 MG PO TABS: 20.0000 mg | ORAL_TABLET | Freq: Once | ORAL | Status: DC

## 2023-11-03 MED ORDER — ACETAMINOPHEN 325 MG PO TABS: 650.0000 mg | ORAL_TABLET | Freq: Once | ORAL | Status: DC

## 2023-11-03 MED ORDER — DARATUMUMAB-HYALURONIDASE-FIHJ 1800-30000 MG-UT/15ML ~~LOC~~ SOLN
1800.0000 mg | Freq: Once | SUBCUTANEOUS | Status: AC
  Administered 2023-11-03: 1800 mg via SUBCUTANEOUS
  Filled 2023-11-03: qty 15

## 2023-11-03 MED ORDER — DIPHENHYDRAMINE HCL 25 MG PO CAPS: 50.0000 mg | ORAL_CAPSULE | Freq: Once | ORAL | Status: DC

## 2023-11-03 NOTE — Patient Instructions (Signed)
 [facility] - A DEPT OF [facility]  Discharge Instructions: Thank you for choosing [facility] to provide your oncology and hematology care.   If you have a lab appointment with the Cancer Center, please go directly to the Cancer Center and check in at the registration area.  Wear comfortable clothing and clothing appropriate for easy access to any Portacath or PICC line.   We strive to give you quality time with your provider. You may need to reschedule your appointment if you arrive late (15 or more minutes).  Arriving late affects you and other patients whose appointments are after yours.  Also, if you miss three or more appointments without notifying the office, you may be dismissed from the clinic at the provider's discretion.      For prescription refill requests, have your pharmacy contact our office and allow 72 hours for refills to be completed.    Today you received the following chemotherapy and/or immunotherapy agents Darzalex       To help prevent nausea and vomiting after your treatment, we encourage you to take your nausea medication as directed.  BELOW ARE SYMPTOMS THAT SHOULD BE REPORTED IMMEDIATELY: *FEVER GREATER THAN 100.4 F (38 C) OR HIGHER *CHILLS OR SWEATING *NAUSEA AND VOMITING THAT IS NOT CONTROLLED WITH YOUR NAUSEA MEDICATION *UNUSUAL SHORTNESS OF BREATH *UNUSUAL BRUISING OR BLEEDING *URINARY PROBLEMS (pain or burning when urinating, or frequent urination) *BOWEL PROBLEMS (unusual diarrhea, constipation, pain near the anus) TENDERNESS IN MOUTH AND THROAT WITH OR WITHOUT PRESENCE OF ULCERS (sore throat, sores in mouth, or a toothache) UNUSUAL RASH, SWELLING OR PAIN  UNUSUAL VAGINAL DISCHARGE OR ITCHING   Items with * indicate a potential emergency and should be followed up as soon as possible or go to the Emergency Department if any problems should occur.  Please show the CHEMOTHERAPY ALERT CARD or IMMUNOTHERAPY  ALERT CARD at check-in to the Emergency Department and triage nurse. Should you have questions after your visit or need to cancel or reschedule your appointment, please contact [facility] - A DEPT OF [facility]  [phone] and follow the prompts.  Office hours are 8:00 a.m. to 4:30 p.m. Monday - Friday. Please note that voicemails left after 4:00 p.m. may not be returned until the following business day.  We are closed weekends and major holidays. You have access to a nurse at all times for urgent questions. Please call the main number to the clinic [phone] and follow the prompts.  For any non-urgent questions, you may also contact your provider using MyChart. We now offer e-Visits for anyone 11 and older to request care online for non-urgent symptoms. For details visit [email or web address].   Also download the MyChart app! Go to the app store, search MyChart, open the app, select , and log in with your MyChart username and password.

## 2023-11-03 NOTE — Progress Notes (Signed)
 Hematology and Oncology Follow Up Visit  James Marks [id number] [birth year] 83 y.o. 11/03/2023   Principle Diagnosis:  IgA kappa  myeloma Iron  deficiency anemia Erythropoietin  deficiency anemia  Past Therapy: Velcade /Revlimid /Decadron  -- s/p cycle #6 -- start on 07/27/2021   Current Therapy:        Faspro/Velcade /Revlimid /Decadron  -- s/p cycle #20 - start  on 01/28/2022 --Revlimid   DC'd on 02/24/2022 - Velcade  d/c on 12/01/2022 Venofer  300 mg IV given on 04/06/2023 Aranesp  300 mcg subcu q. 4 weeks.   Interim History:  Mr. James Marks is here today for follow-up and Faspro.  Thankfully, his last monoclonal spike was not detectable.  His kappa light chain was down to 2.92 mg/dL.  The IgA level was 126 mg/dL.  Today he states that he has been well. He has no concerns. He just had a delicious breakfast.   He has had no problems with nausea or vomiting.  He has had no problems with cough or shortness of breath.  He is not smoking.  He has had no change in bowel or bladder habits.  He has had no rashes.  He has had no bleeding.  He has had no headache.  His iron  studies back in August showed a ferritin of 268 with an iron  saturation of 22%.  Overall, I will say that his performance status is ECOG 1.   Wt Readings from Last 3 Encounters:  11/03/23 171 lb 12.8 oz (77.9 kg)  10/06/23 170 lb (77.1 kg)  09/08/23 171 lb (77.6 kg)    Medications:  Allergies as of 11/03/2023   No Known Allergies      Medication List        Accurate as of November 03, 2023 10:25 AM. If you have any questions, ask your nurse or doctor.          acetaminophen  325 MG tablet Commonly known as: TYLENOL  Take 325 mg by mouth. 02/10/2022 Takes 325 mg one hour prior to antibody treatment.   acyclovir  400 MG tablet Commonly known as: ZOVIRAX  TAKE 1 TABLET(400 MG) BY MOUTH TWICE DAILY   amLODipine  5 MG tablet Commonly known as: NORVASC  Take 5 mg by mouth daily. What changed: Another medication with  the same name was removed. Continue taking this medication, and follow the directions you see here. Changed by: Lauraine CHRISTELLA Dais   amoxicillin  500 MG capsule Commonly known as: AMOXIL  Take four tablets (2000 mg) total one hour prior to dental procedure.   Aspirin 81 MG Caps Take 81 mg by mouth daily.   dexamethasone  4 MG tablet Commonly known as: DECADRON  TAKE 5 TABLETS BY MOUTH 1 HOUR PRIOR TO CHEMO TREATMENT   diphenhydrAMINE  25 mg capsule Commonly known as: BENADRYL  Take 50 mg by mouth. 02/10/2022 Takes one hour prior to antibody treatment.   gabapentin  300 MG capsule Commonly known as: NEURONTIN  TAKE 1 CAPSULE BY MOUTH EVERY MORNING WITH BREAKFAST, 1 CAPSULE WITH DINNER THEN 2 CAPSULES AT BEDTIME   losartan  50 MG tablet Commonly known as: COZAAR  TAKE 1 TABLET BY MOUTH DAILY   montelukast  10 MG tablet Commonly known as: Singulair  Take 1 tablet (10 mg total) by mouth at bedtime.   ondansetron  8 MG tablet Commonly known as: Zofran  Take 1 tablet (8 mg total) by mouth every 8 (eight) hours as needed for nausea or vomiting.   PRESERVISION AREDS 2 PO Take by mouth daily at 6 (six) AM.   prochlorperazine  10 MG tablet Commonly known as: COMPAZINE  Take 1 tablet (10 mg total)  by mouth every 6 (six) hours as needed for nausea or vomiting.   traMADol  50 MG tablet Commonly known as: ULTRAM  Take 50 mg by mouth every 6 (six) hours as needed.   traZODone  50 MG tablet Commonly known as: DESYREL  Take 1 tablet (50 mg total) by mouth at bedtime.        Allergies: No Known Allergies  Past Medical History, Surgical history, Social history, and Family History were reviewed and updated.  Review of Systems:  Review of Systems  Constitutional:  Positive for malaise/fatigue. Negative for weight loss.  HENT: Negative.    Eyes: Negative.   Respiratory: Negative.    Cardiovascular: Negative.   Gastrointestinal: Negative.   Genitourinary: Negative.   Musculoskeletal: Negative.    Skin: Negative.   Neurological: Negative.   Endo/Heme/Allergies: Negative.   Psychiatric/Behavioral: Negative.     Physical Exam:  Vitals:   11/03/23 1014  BP: (!) 114/53  Pulse: 65  Resp: 18  Temp: 97.7 F (36.5 C)  SpO2: 100%     Wt Readings from Last 3 Encounters:  11/03/23 171 lb 12.8 oz (77.9 kg)  10/06/23 170 lb (77.1 kg)  09/08/23 171 lb (77.6 kg)    Physical Exam Vitals reviewed.  HENT:     Head: Normocephalic and atraumatic.  Eyes:     Pupils: Pupils are equal, round, and reactive to light.  Cardiovascular:     Rate and Rhythm: Normal rate and regular rhythm.     Heart sounds: Normal heart sounds.  Pulmonary:     Effort: Pulmonary effort is normal.     Breath sounds: Normal breath sounds.  Abdominal:     General: Bowel sounds are normal.     Palpations: Abdomen is soft.  Musculoskeletal:        General: No tenderness or deformity. Normal range of motion.     Cervical back: Normal range of motion.  Lymphadenopathy:     Cervical: No cervical adenopathy.  Skin:    General: Skin is warm and dry.     Findings: No erythema or rash.  Neurological:     Mental Status: He is alert and oriented to person, place, and time.  Psychiatric:        Behavior: Behavior normal.        Thought Content: Thought content normal.        Judgment: Judgment normal.      Lab Results  Component Value Date   WBC 6.0 11/03/2023   HGB 12.8 (L) 11/03/2023   HCT 40.1 11/03/2023   MCV 92.0 11/03/2023   PLT 194 11/03/2023   Lab Results  Component Value Date   FERRITIN 268 09/08/2023   IRON  63 09/08/2023   TIBC 290 09/08/2023   UIBC 227 09/08/2023   IRONPCTSAT 22 09/08/2023   Lab Results  Component Value Date   RETICCTPCT 0.9 06/14/2023   RBC 4.36 11/03/2023   Lab Results  Component Value Date   KPAFRELGTCHN 29.2 (H) 10/06/2023   LAMBDASER 11.7 10/06/2023   KAPLAMBRATIO 2.50 (H) 10/06/2023   Lab Results  Component Value Date   IGGSERUM 587 (L) 10/06/2023    IGA 126 10/06/2023   IGMSERUM 31 10/06/2023   Lab Results  Component Value Date   TOTALPROTELP 5.9 (L) 10/06/2023   ALBUMINELP 3.6 10/06/2023   A1GS 0.2 10/06/2023   A2GS 0.7 10/06/2023   BETS 0.9 10/06/2023   BETA2SER 0.9 (H) 12/27/2014   GAMS 0.5 10/06/2023   MSPIKE Not Observed 10/06/2023   SPEI  Comment 01/28/2023     Chemistry      Component Value Date/Time   NA 143 10/06/2023 1141   NA 143 12/06/2016 1156   NA 139 08/02/2016 1146   K 4.7 10/06/2023 1141   K 4.2 12/06/2016 1156   K 4.7 08/02/2016 1146   CL 106 10/06/2023 1141   CL 107 12/06/2016 1156   CO2 26 10/06/2023 1141   CO2 28 12/06/2016 1156   CO2 24 08/02/2016 1146   BUN 19 10/06/2023 1141   BUN 29 (H) 12/06/2016 1156   BUN 34.3 (H) 08/02/2016 1146   CREATININE 1.58 (H) 10/06/2023 1141   CREATININE 2.1 (H) 12/06/2016 1156   CREATININE 1.7 (H) 08/02/2016 1146      Component Value Date/Time   CALCIUM 9.2 10/06/2023 1141   CALCIUM 9.1 12/06/2016 1156   CALCIUM 9.5 08/02/2016 1146   ALKPHOS 126 10/06/2023 1141   ALKPHOS 108 (H) 12/06/2016 1156   ALKPHOS 115 08/02/2016 1146   AST 18 10/06/2023 1141   AST 14 08/02/2016 1146   ALT 9 10/06/2023 1141   ALT 18 12/06/2016 1156   ALT 9 08/02/2016 1146   BILITOT 0.4 10/06/2023 1141   BILITOT 0.41 08/02/2016 1146     Encounter Diagnoses  Name Primary?   Smoldering multiple myeloma Yes   Antiviral prophylaxis recommended    Other iron  deficiency anemia    Erythropoietin  deficiency anemia    Impression and Plan: Mr. Sauber is 83 year old man with IgA kappa myeloma.  Currently, he is going on Faspro.  He really has done incredibly well to date.  CBC and CMP reviewed today and acceptable for treatment He is tolerating treatment well   RTC 1 month MD, labs, [facility] Dasher, PA-C 10/23/202510:25 AM

## 2023-11-04 LAB — KAPPA/LAMBDA LIGHT CHAINS
Kappa free light chain: 29.2 mg/L — ABNORMAL HIGH (ref 3.3–19.4)
Kappa, lambda light chain ratio: 2.32 — ABNORMAL HIGH (ref 0.26–1.65)
Lambda free light chains: 12.6 mg/L (ref 5.7–26.3)

## 2023-11-04 LAB — IGG, IGA, IGM
IgA: 127 mg/dL (ref 61–437)
IgG (Immunoglobin G), Serum: 589 mg/dL — ABNORMAL LOW (ref 603–1613)
IgM (Immunoglobulin M), Srm: 31 mg/dL (ref 15–143)

## 2023-11-06 ENCOUNTER — Other Ambulatory Visit: Payer: Self-pay

## 2023-11-09 LAB — PROTEIN ELECTROPHORESIS, SERUM, WITH REFLEX
A/G Ratio: 1.5 (ref 0.7–1.7)
Albumin ELP: 3.6 g/dL (ref 2.9–4.4)
Alpha-1-Globulin: 0.3 g/dL (ref 0.0–0.4)
Alpha-2-Globulin: 0.7 g/dL (ref 0.4–1.0)
Beta Globulin: 0.8 g/dL (ref 0.7–1.3)
Gamma Globulin: 0.6 g/dL (ref 0.4–1.8)
Globulin, Total: 2.4 g/dL (ref 2.2–3.9)
SPEP Interpretation: 0
Total Protein ELP: 6 g/dL (ref 6.0–8.5)

## 2023-11-09 LAB — IMMUNOFIXATION REFLEX, SERUM
IgA: 131 mg/dL (ref 61–437)
IgG (Immunoglobin G), Serum: 632 mg/dL (ref 603–1613)
IgM (Immunoglobulin M), Srm: 33 mg/dL (ref 15–143)

## 2023-12-01 ENCOUNTER — Inpatient Hospital Stay: Admitting: Hematology & Oncology

## 2023-12-01 ENCOUNTER — Other Ambulatory Visit: Payer: Self-pay

## 2023-12-01 ENCOUNTER — Encounter: Payer: Self-pay | Admitting: Hematology & Oncology

## 2023-12-01 ENCOUNTER — Inpatient Hospital Stay

## 2023-12-01 ENCOUNTER — Inpatient Hospital Stay: Attending: Hematology & Oncology

## 2023-12-01 VITALS — BP 129/58 | HR 57 | Temp 97.7°F | Resp 16 | Ht 74.0 in | Wt 168.0 lb

## 2023-12-01 DIAGNOSIS — R5383 Other fatigue: Secondary | ICD-10-CM | POA: Diagnosis not present

## 2023-12-01 DIAGNOSIS — D472 Monoclonal gammopathy: Secondary | ICD-10-CM

## 2023-12-01 DIAGNOSIS — D509 Iron deficiency anemia, unspecified: Secondary | ICD-10-CM | POA: Insufficient documentation

## 2023-12-01 DIAGNOSIS — C9000 Multiple myeloma not having achieved remission: Secondary | ICD-10-CM | POA: Diagnosis present

## 2023-12-01 DIAGNOSIS — Z5112 Encounter for antineoplastic immunotherapy: Secondary | ICD-10-CM | POA: Diagnosis present

## 2023-12-01 DIAGNOSIS — D508 Other iron deficiency anemias: Secondary | ICD-10-CM

## 2023-12-01 DIAGNOSIS — D631 Anemia in chronic kidney disease: Secondary | ICD-10-CM

## 2023-12-01 DIAGNOSIS — Z789 Other specified health status: Secondary | ICD-10-CM

## 2023-12-01 LAB — CBC WITH DIFFERENTIAL (CANCER CENTER ONLY)
Abs Immature Granulocytes: 0.03 K/uL (ref 0.00–0.07)
Basophils Absolute: 0.1 K/uL (ref 0.0–0.1)
Basophils Relative: 1 %
Eosinophils Absolute: 0.2 K/uL (ref 0.0–0.5)
Eosinophils Relative: 2 %
HCT: 40.9 % (ref 39.0–52.0)
Hemoglobin: 13 g/dL (ref 13.0–17.0)
Immature Granulocytes: 0 %
Lymphocytes Relative: 23 %
Lymphs Abs: 1.6 K/uL (ref 0.7–4.0)
MCH: 29 pg (ref 26.0–34.0)
MCHC: 31.8 g/dL (ref 30.0–36.0)
MCV: 91.3 fL (ref 80.0–100.0)
Monocytes Absolute: 0.4 K/uL (ref 0.1–1.0)
Monocytes Relative: 5 %
Neutro Abs: 4.6 K/uL (ref 1.7–7.7)
Neutrophils Relative %: 69 %
Platelet Count: 191 K/uL (ref 150–400)
RBC: 4.48 MIL/uL (ref 4.22–5.81)
RDW: 13.5 % (ref 11.5–15.5)
WBC Count: 6.7 K/uL (ref 4.0–10.5)
nRBC: 0 % (ref 0.0–0.2)

## 2023-12-01 LAB — CMP (CANCER CENTER ONLY)
ALT: 8 U/L (ref 0–44)
AST: 19 U/L (ref 15–41)
Albumin: 4.2 g/dL (ref 3.5–5.0)
Alkaline Phosphatase: 122 U/L (ref 38–126)
Anion gap: 11 (ref 5–15)
BUN: 25 mg/dL — ABNORMAL HIGH (ref 8–23)
CO2: 26 mmol/L (ref 22–32)
Calcium: 9.2 mg/dL (ref 8.9–10.3)
Chloride: 106 mmol/L (ref 98–111)
Creatinine: 1.8 mg/dL — ABNORMAL HIGH (ref 0.61–1.24)
GFR, Estimated: 37 mL/min — ABNORMAL LOW (ref >60)
Glucose, Bld: 132 mg/dL — ABNORMAL HIGH (ref 70–99)
Potassium: 4.4 mmol/L (ref 3.5–5.1)
Sodium: 143 mmol/L (ref 135–145)
Total Bilirubin: 0.4 mg/dL (ref 0.0–1.2)
Total Protein: 6.4 g/dL — ABNORMAL LOW (ref 6.5–8.1)

## 2023-12-01 LAB — IRON AND IRON BINDING CAPACITY (CC-WL,HP ONLY)
Iron: 69 ug/dL (ref 45–182)
Saturation Ratios: 24 % (ref 17.9–39.5)
TIBC: 283 ug/dL (ref 250–450)
UIBC: 214 ug/dL

## 2023-12-01 LAB — FERRITIN: Ferritin: 220 ng/mL (ref 24–336)

## 2023-12-01 LAB — LACTATE DEHYDROGENASE: LDH: 141 U/L (ref 105–235)

## 2023-12-01 MED ORDER — DARATUMUMAB-HYALURONIDASE-FIHJ 1800-30000 MG-UT/15ML ~~LOC~~ SOLN
1800.0000 mg | Freq: Once | SUBCUTANEOUS | Status: AC
  Administered 2023-12-01: 1800 mg via SUBCUTANEOUS
  Filled 2023-12-01: qty 15

## 2023-12-01 MED ORDER — ACETAMINOPHEN 325 MG PO TABS: 650.0000 mg | ORAL_TABLET | Freq: Once | ORAL | Status: DC

## 2023-12-01 MED ORDER — DIPHENHYDRAMINE HCL 25 MG PO CAPS: 50.0000 mg | ORAL_CAPSULE | Freq: Once | ORAL | Status: DC

## 2023-12-01 MED ORDER — DEXAMETHASONE 4 MG PO TABS: 20.0000 mg | ORAL_TABLET | Freq: Once | ORAL | Status: DC

## 2023-12-01 NOTE — Progress Notes (Signed)
 Ok to treat with creainine 1.8 per Dr. Timmy

## 2023-12-01 NOTE — Patient Instructions (Signed)
 [facility] - A DEPT OF [facility]  Discharge Instructions: Thank you for choosing [facility] to provide your oncology and hematology care.   If you have a lab appointment with the Cancer Center, please go directly to the Cancer Center and check in at the registration area.  Wear comfortable clothing and clothing appropriate for easy access to any Portacath or PICC line.   We strive to give you quality time with your provider. You may need to reschedule your appointment if you arrive late (15 or more minutes).  Arriving late affects you and other patients whose appointments are after yours.  Also, if you miss three or more appointments without notifying the office, you may be dismissed from the clinic at the provider's discretion.      For prescription refill requests, have your pharmacy contact our office and allow 72 hours for refills to be completed.    Today you received the following chemotherapy and/or immunotherapy agents Darzalex       To help prevent nausea and vomiting after your treatment, we encourage you to take your nausea medication as directed.  BELOW ARE SYMPTOMS THAT SHOULD BE REPORTED IMMEDIATELY: *FEVER GREATER THAN 100.4 F (38 C) OR HIGHER *CHILLS OR SWEATING *NAUSEA AND VOMITING THAT IS NOT CONTROLLED WITH YOUR NAUSEA MEDICATION *UNUSUAL SHORTNESS OF BREATH *UNUSUAL BRUISING OR BLEEDING *URINARY PROBLEMS (pain or burning when urinating, or frequent urination) *BOWEL PROBLEMS (unusual diarrhea, constipation, pain near the anus) TENDERNESS IN MOUTH AND THROAT WITH OR WITHOUT PRESENCE OF ULCERS (sore throat, sores in mouth, or a toothache) UNUSUAL RASH, SWELLING OR PAIN  UNUSUAL VAGINAL DISCHARGE OR ITCHING   Items with * indicate a potential emergency and should be followed up as soon as possible or go to the Emergency Department if any problems should occur.  Please show the CHEMOTHERAPY ALERT CARD or IMMUNOTHERAPY  ALERT CARD at check-in to the Emergency Department and triage nurse. Should you have questions after your visit or need to cancel or reschedule your appointment, please contact [facility] - A DEPT OF [facility]  [phone] and follow the prompts.  Office hours are 8:00 a.m. to 4:30 p.m. Monday - Friday. Please note that voicemails left after 4:00 p.m. may not be returned until the following business day.  We are closed weekends and major holidays. You have access to a nurse at all times for urgent questions. Please call the main number to the clinic [phone] and follow the prompts.  For any non-urgent questions, you may also contact your provider using MyChart. We now offer e-Visits for anyone 11 and older to request care online for non-urgent symptoms. For details visit [email or web address].   Also download the MyChart app! Go to the app store, search MyChart, open the app, select , and log in with your MyChart username and password.

## 2023-12-01 NOTE — Progress Notes (Signed)
 Hematology and Oncology Follow Up Visit  James Marks [id number] 24-Sep-1940 83 y.o. 12/01/2023   Principle Diagnosis:  IgA kappa  myeloma Iron  deficiency anemia Erythropoietin  deficiency anemia  Past Therapy: Velcade /Revlimid /Decadron  -- s/p cycle #6 -- start on 07/27/2021   Current Therapy:        Faspro/Velcade /Revlimid /Decadron  -- s/p cycle #24 - start  on 01/28/2022 --Revlimid   DC'd on 02/24/2022 - Velcade  d/c on 12/01/2022 Venofer  300 mg IV given on 04/06/2023 Aranesp  300 mcg subcu q. 4 weeks.   Interim History:  James Marks is here today for follow-up and Faspro.  He is quite busy at home.  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  His wife is having problems.  She is not with us  today.  She is having some hip issues.  It sounds like her right hip might need to be repaired.  He has done well with treatment.  When we last saw him, there was no monoclonal spike in his blood.  His IgA level was 130 mg/dL.  The Kappa light chain was 2.9 mg/dL.  His last iron  studies that were done in October showed a ferritin of 236 with an iron  saturation of 25%.  He has had a good appetite.  He should be able to have a nice Thanksgiving.  He has had no issues with nausea or vomiting.  He has had no problems with rashes.  He has had no leg swelling.  Unfortunately, has not been able to play golf because of all the yard work.  Overall, I will say his performance status is probably ECOG 1.    Wt Readings from Last 3 Encounters:  12/01/23 168 lb (76.2 kg)  11/03/23 171 lb 12.8 oz (77.9 kg)  10/06/23 170 lb (77.1 kg)    Medications:  Allergies as of 12/01/2023   No Known Allergies      Medication List        Accurate as of December 01, 2023 11:06 AM. If you have any questions, ask your nurse or doctor.          acetaminophen  325 MG tablet Commonly known as: TYLENOL  Take 325 mg by mouth. 02/10/2022 Takes 325 mg one hour prior to antibody  treatment.   acyclovir  400 MG tablet Commonly known as: ZOVIRAX  TAKE 1 TABLET(400 MG) BY MOUTH TWICE DAILY   amLODipine  5 MG tablet Commonly known as: NORVASC  Take 5 mg by mouth daily.   amoxicillin  500 MG capsule Commonly known as: AMOXIL  Take four tablets (2000 mg) total one hour prior to dental procedure.   Aspirin 81 MG Caps Take 81 mg by mouth daily.   dexamethasone  4 MG tablet Commonly known as: DECADRON  TAKE 5 TABLETS BY MOUTH 1 HOUR PRIOR TO CHEMO TREATMENT   diphenhydrAMINE  25 mg capsule Commonly known as: BENADRYL  Take 50 mg by mouth. 02/10/2022 Takes one hour prior to antibody treatment.   gabapentin  300 MG capsule Commonly known as: NEURONTIN  TAKE 1 CAPSULE BY MOUTH EVERY MORNING WITH BREAKFAST, 1 CAPSULE WITH DINNER THEN 2 CAPSULES AT BEDTIME   losartan  50 MG tablet Commonly known as: COZAAR  TAKE 1 TABLET BY MOUTH DAILY   montelukast  10 MG tablet Commonly known as: Singulair  Take 1 tablet (10 mg total) by mouth at bedtime.   ondansetron  8 MG tablet Commonly known as: Zofran  Take 1 tablet (8 mg total) by mouth every 8 (eight) hours as needed for nausea or vomiting.   PRESERVISION AREDS 2 PO  Take by mouth daily at 6 (six) AM.   prochlorperazine  10 MG tablet Commonly known as: COMPAZINE  Take 1 tablet (10 mg total) by mouth every 6 (six) hours as needed for nausea or vomiting.   traMADol  50 MG tablet Commonly known as: ULTRAM  Take 50 mg by mouth every 6 (six) hours as needed.   traZODone  50 MG tablet Commonly known as: DESYREL  Take 1 tablet (50 mg total) by mouth at bedtime.        Allergies: No Known Allergies  Past Medical History, Surgical history, Social history, and Family History were reviewed and updated.  Review of Systems:  Review of Systems  Constitutional:  Positive for malaise/fatigue. Negative for weight loss.  HENT: Negative.    Eyes: Negative.   Respiratory: Negative.    Cardiovascular: Negative.   Gastrointestinal:  Negative.   Genitourinary: Negative.   Musculoskeletal: Negative.   Skin: Negative.   Neurological: Negative.   Endo/Heme/Allergies: Negative.   Psychiatric/Behavioral: Negative.     Physical Exam:  Vitals:   12/01/23 1000  BP: (!) 129/58  Pulse: (!) 57  Resp: 16  Temp: 97.7 F (36.5 C)  SpO2: 99%     Wt Readings from Last 3 Encounters:  12/01/23 168 lb (76.2 kg)  11/03/23 171 lb 12.8 oz (77.9 kg)  10/06/23 170 lb (77.1 kg)    Physical Exam Vitals reviewed.  HENT:     Head: Normocephalic and atraumatic.  Eyes:     Pupils: Pupils are equal, round, and reactive to light.  Cardiovascular:     Rate and Rhythm: Normal rate and regular rhythm.     Heart sounds: Normal heart sounds.  Pulmonary:     Effort: Pulmonary effort is normal.     Breath sounds: Normal breath sounds.  Abdominal:     General: Bowel sounds are normal.     Palpations: Abdomen is soft.  Musculoskeletal:        General: No tenderness or deformity. Normal range of motion.     Cervical back: Normal range of motion.  Lymphadenopathy:     Cervical: No cervical adenopathy.  Skin:    General: Skin is warm and dry.     Findings: No erythema or rash.  Neurological:     Mental Status: He is alert and oriented to person, place, and time.  Psychiatric:        Behavior: Behavior normal.        Thought Content: Thought content normal.        Judgment: Judgment normal.      Lab Results  Component Value Date   WBC 6.7 12/01/2023   HGB 13.0 12/01/2023   HCT 40.9 12/01/2023   MCV 91.3 12/01/2023   PLT 191 12/01/2023   Lab Results  Component Value Date   FERRITIN 236 11/03/2023   IRON  76 11/03/2023   TIBC 305 11/03/2023   UIBC 229 11/03/2023   IRONPCTSAT 25 11/03/2023   Lab Results  Component Value Date   RETICCTPCT 0.9 06/14/2023   RBC 4.48 12/01/2023   Lab Results  Component Value Date   KPAFRELGTCHN 29.2 (H) 11/03/2023   LAMBDASER 12.6 11/03/2023   KAPLAMBRATIO 2.32 (H) 11/03/2023    Lab Results  Component Value Date   IGGSERUM 589 (L) 11/03/2023   IGGSERUM 632 11/03/2023   IGA 127 11/03/2023   IGA 131 11/03/2023   IGMSERUM 31 11/03/2023   IGMSERUM 33 11/03/2023   Lab Results  Component Value Date   TOTALPROTELP 6.0 11/03/2023  ALBUMINELP 3.6 11/03/2023   A1GS 0.3 11/03/2023   A2GS 0.7 11/03/2023   BETS 0.8 11/03/2023   BETA2SER 0.9 (H) 12/27/2014   GAMS 0.6 11/03/2023   MSPIKE Comment (A) 11/03/2023   SPEI Comment 01/28/2023     Chemistry      Component Value Date/Time   NA 143 12/01/2023 1003   NA 143 12/06/2016 1156   NA 139 08/02/2016 1146   K 4.4 12/01/2023 1003   K 4.2 12/06/2016 1156   K 4.7 08/02/2016 1146   CL 106 12/01/2023 1003   CL 107 12/06/2016 1156   CO2 26 12/01/2023 1003   CO2 28 12/06/2016 1156   CO2 24 08/02/2016 1146   BUN 25 (H) 12/01/2023 1003   BUN 29 (H) 12/06/2016 1156   BUN 34.3 (H) 08/02/2016 1146   CREATININE 1.80 (H) 12/01/2023 1003   CREATININE 2.1 (H) 12/06/2016 1156   CREATININE 1.7 (H) 08/02/2016 1146      Component Value Date/Time   CALCIUM 9.2 12/01/2023 1003   CALCIUM 9.1 12/06/2016 1156   CALCIUM 9.5 08/02/2016 1146   ALKPHOS 122 12/01/2023 1003   ALKPHOS 108 (H) 12/06/2016 1156   ALKPHOS 115 08/02/2016 1146   AST 19 12/01/2023 1003   AST 14 08/02/2016 1146   ALT 8 12/01/2023 1003   ALT 18 12/06/2016 1156   ALT 9 08/02/2016 1146   BILITOT 0.4 12/01/2023 1003   BILITOT 0.41 08/02/2016 1146     Impression and Plan: Mr. Mcgeehan is 83 year old man with IgA kappa myeloma.  Currently, he is being treated with Faspro.  He has done very well with this.  His myeloma is quite low.  His hemoglobin looks great.  He does not need any Aranesp .  He does get iron  on occasion.  We will plan for another follow-up in 1 month.    Maude JONELLE Crease, MD 11/20/202511:06 AM

## 2023-12-02 LAB — KAPPA/LAMBDA LIGHT CHAINS
Kappa free light chain: 29.2 mg/L — ABNORMAL HIGH (ref 3.3–19.4)
Kappa, lambda light chain ratio: 2.37 — ABNORMAL HIGH (ref 0.26–1.65)
Lambda free light chains: 12.3 mg/L (ref 5.7–26.3)

## 2023-12-04 LAB — MULTIPLE MYELOMA PANEL, SERUM
Albumin SerPl Elph-Mcnc: 3.6 g/dL (ref 2.9–4.4)
Albumin/Glob SerPl: 1.6 (ref 0.7–1.7)
Alpha 1: 0.2 g/dL (ref 0.0–0.4)
Alpha2 Glob SerPl Elph-Mcnc: 0.7 g/dL (ref 0.4–1.0)
B-Globulin SerPl Elph-Mcnc: 0.8 g/dL (ref 0.7–1.3)
Gamma Glob SerPl Elph-Mcnc: 0.5 g/dL (ref 0.4–1.8)
Globulin, Total: 2.3 g/dL (ref 2.2–3.9)
IgA: 132 mg/dL (ref 61–437)
IgG (Immunoglobin G), Serum: 607 mg/dL (ref 603–1613)
IgM (Immunoglobulin M), Srm: 30 mg/dL (ref 15–143)
M Protein SerPl Elph-Mcnc: 0.1 g/dL — ABNORMAL HIGH
Total Protein ELP: 5.9 g/dL — ABNORMAL LOW (ref 6.0–8.5)

## 2023-12-13 ENCOUNTER — Ambulatory Visit: Admitting: Family Medicine

## 2023-12-20 ENCOUNTER — Other Ambulatory Visit: Payer: Self-pay

## 2023-12-28 ENCOUNTER — Other Ambulatory Visit: Payer: Self-pay

## 2023-12-29 ENCOUNTER — Inpatient Hospital Stay

## 2023-12-29 ENCOUNTER — Other Ambulatory Visit: Payer: Self-pay | Admitting: *Deleted

## 2023-12-29 ENCOUNTER — Encounter: Payer: Self-pay | Admitting: Hematology & Oncology

## 2023-12-29 ENCOUNTER — Other Ambulatory Visit: Payer: Self-pay

## 2023-12-29 ENCOUNTER — Inpatient Hospital Stay: Attending: Hematology & Oncology

## 2023-12-29 ENCOUNTER — Inpatient Hospital Stay: Admitting: Hematology & Oncology

## 2023-12-29 VITALS — BP 128/67 | HR 55 | Temp 97.7°F | Resp 16 | Ht 74.0 in | Wt 166.0 lb

## 2023-12-29 DIAGNOSIS — F17210 Nicotine dependence, cigarettes, uncomplicated: Secondary | ICD-10-CM | POA: Insufficient documentation

## 2023-12-29 DIAGNOSIS — R5383 Other fatigue: Secondary | ICD-10-CM | POA: Insufficient documentation

## 2023-12-29 DIAGNOSIS — C9000 Multiple myeloma not having achieved remission: Secondary | ICD-10-CM | POA: Insufficient documentation

## 2023-12-29 DIAGNOSIS — D472 Monoclonal gammopathy: Secondary | ICD-10-CM

## 2023-12-29 DIAGNOSIS — Z5112 Encounter for antineoplastic immunotherapy: Secondary | ICD-10-CM | POA: Insufficient documentation

## 2023-12-29 DIAGNOSIS — D509 Iron deficiency anemia, unspecified: Secondary | ICD-10-CM | POA: Diagnosis not present

## 2023-12-29 LAB — CMP (CANCER CENTER ONLY)
ALT: 8 U/L (ref 0–44)
AST: 16 U/L (ref 15–41)
Albumin: 4.3 g/dL (ref 3.5–5.0)
Alkaline Phosphatase: 125 U/L (ref 38–126)
Anion gap: 10 (ref 5–15)
BUN: 17 mg/dL (ref 8–23)
CO2: 28 mmol/L (ref 22–32)
Calcium: 9.2 mg/dL (ref 8.9–10.3)
Chloride: 106 mmol/L (ref 98–111)
Creatinine: 1.41 mg/dL — ABNORMAL HIGH (ref 0.61–1.24)
GFR, Estimated: 49 mL/min — ABNORMAL LOW (ref >60)
Glucose, Bld: 127 mg/dL — ABNORMAL HIGH (ref 70–99)
Potassium: 4.4 mmol/L (ref 3.5–5.1)
Sodium: 144 mmol/L (ref 135–145)
Total Bilirubin: 0.3 mg/dL (ref 0.0–1.2)
Total Protein: 6.7 g/dL (ref 6.5–8.1)

## 2023-12-29 LAB — CBC WITH DIFFERENTIAL (CANCER CENTER ONLY)
Abs Immature Granulocytes: 0.02 K/uL (ref 0.00–0.07)
Basophils Absolute: 0.1 K/uL (ref 0.0–0.1)
Basophils Relative: 1 %
Eosinophils Absolute: 0.2 K/uL (ref 0.0–0.5)
Eosinophils Relative: 2 %
HCT: 40.9 % (ref 39.0–52.0)
Hemoglobin: 13.1 g/dL (ref 13.0–17.0)
Immature Granulocytes: 0 %
Lymphocytes Relative: 24 %
Lymphs Abs: 1.6 K/uL (ref 0.7–4.0)
MCH: 28.9 pg (ref 26.0–34.0)
MCHC: 32 g/dL (ref 30.0–36.0)
MCV: 90.3 fL (ref 80.0–100.0)
Monocytes Absolute: 0.4 K/uL (ref 0.1–1.0)
Monocytes Relative: 6 %
Neutro Abs: 4.4 K/uL (ref 1.7–7.7)
Neutrophils Relative %: 67 %
Platelet Count: 220 K/uL (ref 150–400)
RBC: 4.53 MIL/uL (ref 4.22–5.81)
RDW: 13.4 % (ref 11.5–15.5)
WBC Count: 6.6 K/uL (ref 4.0–10.5)
nRBC: 0 % (ref 0.0–0.2)

## 2023-12-29 LAB — IRON AND IRON BINDING CAPACITY (CC-WL,HP ONLY)
Iron: 73 ug/dL (ref 45–182)
Saturation Ratios: 26 % (ref 17.9–39.5)
TIBC: 284 ug/dL (ref 250–450)
UIBC: 211 ug/dL

## 2023-12-29 LAB — LACTATE DEHYDROGENASE: LDH: 132 U/L (ref 105–235)

## 2023-12-29 LAB — FERRITIN: Ferritin: 181 ng/mL (ref 24–336)

## 2023-12-29 MED ORDER — MONTELUKAST SODIUM 10 MG PO TABS: 10.0000 mg | ORAL_TABLET | ORAL | 4 refills | Status: AC

## 2023-12-29 MED ORDER — DEXAMETHASONE 4 MG PO TABS: ORAL_TABLET | ORAL | 0 refills | Status: DC

## 2023-12-29 MED ORDER — MONTELUKAST SODIUM 10 MG PO TABS: 10.0000 mg | ORAL_TABLET | Freq: Every day | ORAL | 2 refills | Status: DC

## 2023-12-29 MED ORDER — DARATUMUMAB-HYALURONIDASE-FIHJ 1800-30000 MG-UT/15ML ~~LOC~~ SOLN
1800.0000 mg | Freq: Once | SUBCUTANEOUS | Status: AC
  Administered 2023-12-29: 13:00:00 1800 mg via SUBCUTANEOUS
  Filled 2023-12-29: qty 15

## 2023-12-29 MED ORDER — DEXAMETHASONE 4 MG PO TABS: ORAL_TABLET | ORAL | 2 refills | Status: AC

## 2023-12-29 MED ADMIN — Acetaminophen Tab 325 MG: 650 mg | ORAL | @ 12:00:00 | NDC 00904677361

## 2023-12-29 MED FILL — Acetaminophen Tab 325 MG: 650.0000 mg | ORAL | Qty: 2 | Status: AC

## 2023-12-29 NOTE — Progress Notes (Signed)
 Hematology and Oncology Follow Up Visit  James Marks [id number] [birth year] 83 y.o. 12/29/2023   Principle Diagnosis:  IgA kappa  myeloma Iron  deficiency anemia Erythropoietin  deficiency anemia  Past Therapy: Velcade /Revlimid /Decadron  -- s/p cycle #6 -- start on 07/27/2021   Current Therapy:        Faspro/Velcade /Revlimid /Decadron  -- s/p cycle #24 - start  on 01/28/2022 --Revlimid   DC'd on 02/24/2022 - Velcade  d/c on 12/01/2022 Venofer  300 mg IV given on 04/06/2023 Aranesp  300 mcg subcu q. 4 weeks.   Interim History:  James Marks is here today for follow-up and Faspro.  He had a very nice Thanksgiving.  He comes in with his wife.  He and his wife will have grand children over.  I know they will have a good time over the Christmas holiday.  He is still smoking.  He smokes 3-4 cigarettes a day.  He has had no problems with nausea or vomiting.  Has been no problem with bowels or bladder.  He has had no bleeding.  He has had no rashes.  There has been no leg swelling.  When we last saw him, his monoclonal spike was 0.1 g/dL.  His IgA level was 132 mg/dL.  The Kappa light chain was 2.9 mg/dL.  Everything really is holding stable.  Overall, I would have to say that his performance status is probably ECOG 1.      Wt Readings from Last 3 Encounters:  12/29/23 166 lb (75.3 kg)  12/01/23 168 lb (76.2 kg)  11/03/23 171 lb 12.8 oz (77.9 kg)    Medications:  Allergies as of 12/29/2023   No Known Allergies      Medication List        Accurate as of December 29, 2023 11:49 AM. If you have any questions, ask your nurse or doctor.          acetaminophen  325 MG tablet Commonly known as: TYLENOL  Take 325 mg by mouth. 02/10/2022 Takes 325 mg one hour prior to antibody treatment.   acyclovir  400 MG tablet Commonly known as: ZOVIRAX  TAKE 1 TABLET(400 MG) BY MOUTH TWICE DAILY   amLODipine  5 MG tablet Commonly known as: NORVASC  Take 5 mg by mouth daily.   amoxicillin  500  MG capsule Commonly known as: AMOXIL  Take four tablets (2000 mg) total one hour prior to dental procedure.   Aspirin 81 MG Caps Take 81 mg by mouth daily.   dexamethasone  4 MG tablet Commonly known as: DECADRON  TAKE 5 TABLETS BY MOUTH 1 HOUR PRIOR TO CHEMO TREATMENT   diphenhydrAMINE  25 mg capsule Commonly known as: BENADRYL  Take 50 mg by mouth. 02/10/2022 Takes one hour prior to antibody treatment.   gabapentin  300 MG capsule Commonly known as: NEURONTIN  TAKE 1 CAPSULE BY MOUTH EVERY MORNING WITH BREAKFAST, 1 CAPSULE WITH DINNER THEN 2 CAPSULES AT BEDTIME   losartan  50 MG tablet Commonly known as: COZAAR  TAKE 1 TABLET BY MOUTH DAILY   montelukast  10 MG tablet Commonly known as: Singulair  Take 1 tablet (10 mg total) by mouth at bedtime.   ondansetron  8 MG tablet Commonly known as: Zofran  Take 1 tablet (8 mg total) by mouth every 8 (eight) hours as needed for nausea or vomiting.   PRESERVISION AREDS 2 PO Take by mouth daily at 6 (six) AM.   prochlorperazine  10 MG tablet Commonly known as: COMPAZINE  Take 1 tablet (10 mg total) by mouth every 6 (six) hours as needed for nausea or vomiting.   traMADol  50 MG tablet Commonly known  as: ULTRAM  Take 50 mg by mouth every 6 (six) hours as needed.   traZODone  50 MG tablet Commonly known as: DESYREL  Take 1 tablet (50 mg total) by mouth at bedtime.        Allergies: No Known Allergies  Past Medical History, Surgical history, Social history, and Family History were reviewed and updated.  Review of Systems:  Review of Systems  Constitutional:  Positive for malaise/fatigue. Negative for weight loss.  HENT: Negative.    Eyes: Negative.   Respiratory: Negative.    Cardiovascular: Negative.   Gastrointestinal: Negative.   Genitourinary: Negative.   Musculoskeletal: Negative.   Skin: Negative.   Neurological: Negative.   Endo/Heme/Allergies: Negative.   Psychiatric/Behavioral: Negative.     Physical Exam:  Vitals:    12/29/23 1100  BP: 128/67  Pulse: (!) 55  Resp: 16  Temp: 97.7 F (36.5 C)  SpO2: 98%     Wt Readings from Last 3 Encounters:  12/29/23 166 lb (75.3 kg)  12/01/23 168 lb (76.2 kg)  11/03/23 171 lb 12.8 oz (77.9 kg)    Physical Exam Vitals reviewed.  HENT:     Head: Normocephalic and atraumatic.  Eyes:     Pupils: Pupils are equal, round, and reactive to light.  Cardiovascular:     Rate and Rhythm: Normal rate and regular rhythm.     Heart sounds: Normal heart sounds.  Pulmonary:     Effort: Pulmonary effort is normal.     Breath sounds: Normal breath sounds.  Abdominal:     General: Bowel sounds are normal.     Palpations: Abdomen is soft.  Musculoskeletal:        General: No tenderness or deformity. Normal range of motion.     Cervical back: Normal range of motion.  Lymphadenopathy:     Cervical: No cervical adenopathy.  Skin:    General: Skin is warm and dry.     Findings: No erythema or rash.  Neurological:     Mental Status: He is alert and oriented to person, place, and time.  Psychiatric:        Behavior: Behavior normal.        Thought Content: Thought content normal.        Judgment: Judgment normal.      Lab Results  Component Value Date   WBC 6.6 12/29/2023   HGB 13.1 12/29/2023   HCT 40.9 12/29/2023   MCV 90.3 12/29/2023   PLT 220 12/29/2023   Lab Results  Component Value Date   FERRITIN 220 12/01/2023   IRON  69 12/01/2023   TIBC 283 12/01/2023   UIBC 214 12/01/2023   IRONPCTSAT 24 12/01/2023   Lab Results  Component Value Date   RETICCTPCT 0.9 06/14/2023   RBC 4.53 12/29/2023   Lab Results  Component Value Date   KPAFRELGTCHN 29.2 (H) 12/01/2023   LAMBDASER 12.3 12/01/2023   KAPLAMBRATIO 2.37 (H) 12/01/2023   Lab Results  Component Value Date   IGGSERUM 607 12/01/2023   IGA 132 12/01/2023   IGMSERUM 30 12/01/2023   Lab Results  Component Value Date   TOTALPROTELP 5.9 (L) 12/01/2023   ALBUMINELP 3.6 11/03/2023   A1GS 0.3  11/03/2023   A2GS 0.7 11/03/2023   BETS 0.8 11/03/2023   BETA2SER 0.9 (H) 12/27/2014   GAMS 0.6 11/03/2023   MSPIKE Comment (A) 11/03/2023   SPEI Comment 01/28/2023     Chemistry      Component Value Date/Time   NA 144 12/29/2023 1027  NA 143 12/06/2016 1156   NA 139 08/02/2016 1146   K 4.4 12/29/2023 1027   K 4.2 12/06/2016 1156   K 4.7 08/02/2016 1146   CL 106 12/29/2023 1027   CL 107 12/06/2016 1156   CO2 28 12/29/2023 1027   CO2 28 12/06/2016 1156   CO2 24 08/02/2016 1146   BUN 17 12/29/2023 1027   BUN 29 (H) 12/06/2016 1156   BUN 34.3 (H) 08/02/2016 1146   CREATININE 1.41 (H) 12/29/2023 1027   CREATININE 2.1 (H) 12/06/2016 1156   CREATININE 1.7 (H) 08/02/2016 1146      Component Value Date/Time   CALCIUM 9.2 12/29/2023 1027   CALCIUM 9.1 12/06/2016 1156   CALCIUM 9.5 08/02/2016 1146   ALKPHOS 125 12/29/2023 1027   ALKPHOS 108 (H) 12/06/2016 1156   ALKPHOS 115 08/02/2016 1146   AST 16 12/29/2023 1027   AST 14 08/02/2016 1146   ALT 8 12/29/2023 1027   ALT 18 12/06/2016 1156   ALT 9 08/02/2016 1146   BILITOT 0.3 12/29/2023 1027   BILITOT 0.41 08/02/2016 1146     Impression and Plan: James Marks is 83 year old man with IgA kappa myeloma.  Currently, he is being treated with Faspro.  He has done very well with this.  His myeloma is quite low.  His hemoglobin looks great.  He does not need any Aranesp .  He does get iron  on occasion.  We will plan for another follow-up in 1 month.    Maude JONELLE Crease, MD 12/18/202511:49 AM

## 2023-12-29 NOTE — Patient Instructions (Signed)
 [facility] - A DEPT OF [facility]  Discharge Instructions: Thank you for choosing [facility] to provide your oncology and hematology care.   If you have a lab appointment with the Cancer Center, please go directly to the Cancer Center and check in at the registration area.  Wear comfortable clothing and clothing appropriate for easy access to any Portacath or PICC line.   We strive to give you quality time with your provider. You may need to reschedule your appointment if you arrive late (15 or more minutes).  Arriving late affects you and other patients whose appointments are after yours.  Also, if you miss three or more appointments without notifying the office, you may be dismissed from the clinic at the provider's discretion.      For prescription refill requests, have your pharmacy contact our office and allow 72 hours for refills to be completed.    Today you received the following chemotherapy and/or immunotherapy agents Darzalex       To help prevent nausea and vomiting after your treatment, we encourage you to take your nausea medication as directed.  BELOW ARE SYMPTOMS THAT SHOULD BE REPORTED IMMEDIATELY: *FEVER GREATER THAN 100.4 F (38 C) OR HIGHER *CHILLS OR SWEATING *NAUSEA AND VOMITING THAT IS NOT CONTROLLED WITH YOUR NAUSEA MEDICATION *UNUSUAL SHORTNESS OF BREATH *UNUSUAL BRUISING OR BLEEDING *URINARY PROBLEMS (pain or burning when urinating, or frequent urination) *BOWEL PROBLEMS (unusual diarrhea, constipation, pain near the anus) TENDERNESS IN MOUTH AND THROAT WITH OR WITHOUT PRESENCE OF ULCERS (sore throat, sores in mouth, or a toothache) UNUSUAL RASH, SWELLING OR PAIN  UNUSUAL VAGINAL DISCHARGE OR ITCHING   Items with * indicate a potential emergency and should be followed up as soon as possible or go to the Emergency Department if any problems should occur.  Please show the CHEMOTHERAPY ALERT CARD or IMMUNOTHERAPY  ALERT CARD at check-in to the Emergency Department and triage nurse. Should you have questions after your visit or need to cancel or reschedule your appointment, please contact [facility] - A DEPT OF [facility]  [phone] and follow the prompts.  Office hours are 8:00 a.m. to 4:30 p.m. Monday - Friday. Please note that voicemails left after 4:00 p.m. may not be returned until the following business day.  We are closed weekends and major holidays. You have access to a nurse at all times for urgent questions. Please call the main number to the clinic [phone] and follow the prompts.  For any non-urgent questions, you may also contact your provider using MyChart. We now offer e-Visits for anyone 11 and older to request care online for non-urgent symptoms. For details visit [email or web address].   Also download the MyChart app! Go to the app store, search MyChart, open the app, select , and log in with your MyChart username and password.

## 2023-12-30 ENCOUNTER — Ambulatory Visit: Payer: Self-pay | Admitting: Hematology & Oncology

## 2023-12-30 ENCOUNTER — Encounter: Payer: Self-pay | Admitting: *Deleted

## 2023-12-30 LAB — IGG, IGA, IGM
IgA: 126 mg/dL (ref 61–437)
IgG (Immunoglobin G), Serum: 613 mg/dL (ref 603–1613)
IgM (Immunoglobulin M), Srm: 43 mg/dL (ref 15–143)

## 2023-12-30 LAB — KAPPA/LAMBDA LIGHT CHAINS
Kappa free light chain: 30.2 mg/L — ABNORMAL HIGH (ref 3.3–19.4)
Kappa, lambda light chain ratio: 2.52 — ABNORMAL HIGH (ref 0.26–1.65)
Lambda free light chains: 12 mg/L (ref 5.7–26.3)

## 2023-12-31 ENCOUNTER — Other Ambulatory Visit: Payer: Self-pay

## 2024-01-03 LAB — PROTEIN ELECTROPHORESIS, SERUM, WITH REFLEX
A/G Ratio: 1.5 (ref 0.7–1.7)
Albumin ELP: 3.7 g/dL (ref 2.9–4.4)
Alpha-1-Globulin: 0.3 g/dL (ref 0.0–0.4)
Alpha-2-Globulin: 0.8 g/dL (ref 0.4–1.0)
Beta Globulin: 0.9 g/dL (ref 0.7–1.3)
Gamma Globulin: 0.6 g/dL (ref 0.4–1.8)
Globulin, Total: 2.5 g/dL (ref 2.2–3.9)
M-Spike, %: 0.2 g/dL — ABNORMAL HIGH
SPEP Interpretation: 0
Total Protein ELP: 6.2 g/dL (ref 6.0–8.5)

## 2024-01-03 LAB — IMMUNOFIXATION REFLEX, SERUM
IgA: 136 mg/dL (ref 61–437)
IgG (Immunoglobin G), Serum: 652 mg/dL (ref 603–1613)
IgM (Immunoglobulin M), Srm: 46 mg/dL (ref 15–143)

## 2024-01-04 ENCOUNTER — Other Ambulatory Visit: Payer: Self-pay | Admitting: Family Medicine

## 2024-01-06 ENCOUNTER — Other Ambulatory Visit: Payer: Self-pay

## 2024-01-06 NOTE — Telephone Encounter (Signed)
 Forwarded to PCP as Rx listed as historical med.

## 2024-01-16 ENCOUNTER — Encounter: Payer: Self-pay | Admitting: Family Medicine

## 2024-01-16 ENCOUNTER — Ambulatory Visit (INDEPENDENT_AMBULATORY_CARE_PROVIDER_SITE_OTHER): Admitting: Family Medicine

## 2024-01-16 VITALS — BP 124/66 | HR 89 | Temp 97.5°F | Ht 73.62 in | Wt 168.2 lb

## 2024-01-16 DIAGNOSIS — Z0000 Encounter for general adult medical examination without abnormal findings: Secondary | ICD-10-CM

## 2024-01-16 DIAGNOSIS — R7303 Prediabetes: Secondary | ICD-10-CM

## 2024-01-16 LAB — POCT GLYCOSYLATED HEMOGLOBIN (HGB A1C): Hemoglobin A1C: 6.3 % — AB (ref 4.0–5.6)

## 2024-01-16 NOTE — Progress Notes (Signed)
 "  Established Patient Office Visit  Subjective   Patient ID: James Marks, male    DOB: 19-Oct-1940  Age: 84 y.o. MRN: [id number]  Chief Complaint  Patient presents with   Annual Exam    HPI    James Marks is here today for physical exam/wellness visit.  His chronic problems include history of hypertension, peripheral neuropathy, chronic kidney disease stage III, smoldering multiple myeloma, prediabetes, gout.  He is followed closely by hematology and gets labs about every month through their office.  Generally doing well.  Blood pressures recently been controlled with combination therapy with amlodipine  and losartan .  He takes gabapentin  for neuropathy symptoms.  He had some recent modest weight loss but overall eating fairly well.  He does have history of prediabetes.  Does not monitor blood sugars regularly.  He has history of subarachnoid hemorrhage back in 2001.  Health maintenance reviewed:  Health Maintenance  Topic Date Due   Medicare Annual Wellness (AWV)  Never done   Zoster Vaccines- Shingrix (1 of 2) Never done   DTaP/Tdap/Td (2 - Tdap) 01/12/2015   COVID-19 Vaccine (5 - 2025-26 season) 09/12/2023   Pneumococcal Vaccine: 50+ Years  Completed   Influenza Vaccine  Completed   Meningococcal B Vaccine  Aged Out   - He thinks he has had RSV vaccine but is not sure -No history of Shingrix -Aged out of further colonoscopies  Family History  Problem Relation Age of Onset   Cancer Mother        breast   Diabetes Mother    Hypertension Father    Diabetes Other        parent   Macular degeneration Brother    Diabetes Mellitus II Brother    Social history-married.  He has a daughter who is essentially estranged.  Still smokes 3 cigarettes/day.  No regular alcohol use.  Enjoys golf.  Past Medical History:  Diagnosis Date   Cancer (HCC)    myeloma   Diverticulitis    Erythropoietin  deficiency anemia 03/25/2023   Hearing loss    Hx of colonic polyps     Hyperlipidemia    Hypertension    Kidney stones    Osteopenia    Smoldering multiple myeloma 08/26/2014   Subarachnoid hemorrhage (HCC) 02/12/1999   nontraumatic   Past Surgical History:  Procedure Laterality Date   None      reports that he has quit smoking. His smoking use included cigarettes. He quit smokeless tobacco use about 11 years ago. He reports that he does not drink alcohol and does not use drugs. family history includes Cancer in his mother; Diabetes in his mother and another family member; Diabetes Mellitus II in his brother; Hypertension in his father; Macular degeneration in his brother. Allergies[1]  Review of Systems  Constitutional:  Negative for malaise/fatigue.  Eyes:  Negative for blurred vision.  Respiratory:  Negative for shortness of breath.   Cardiovascular:  Negative for chest pain.  Gastrointestinal:  Negative for abdominal pain.  Genitourinary:  Negative for dysuria.  Neurological:  Negative for dizziness, weakness and headaches.      Objective:     BP 124/66   Pulse 89   Temp (!) 97.5 F (36.4 C) (Oral)   Ht 6' 1.62 (1.87 m)   Wt 168 lb 3.2 oz (76.3 kg)   SpO2 95%   BMI 21.82 kg/m  BP Readings from Last 3 Encounters:  01/16/24 124/66  12/29/23 128/67  12/01/23 (!) 129/58   Wt  Readings from Last 3 Encounters:  01/16/24 168 lb 3.2 oz (76.3 kg)  12/29/23 166 lb (75.3 kg)  12/01/23 168 lb (76.2 kg)      Physical Exam Vitals reviewed.  Constitutional:      General: He is not in acute distress.    Appearance: He is well-developed. He is not ill-appearing.  Eyes:     Pupils: Pupils are equal, round, and reactive to light.  Neck:     Thyroid : No thyromegaly.  Cardiovascular:     Rate and Rhythm: Normal rate and regular rhythm.  Pulmonary:     Effort: Pulmonary effort is normal. No respiratory distress.     Breath sounds: Normal breath sounds. No wheezing or rales.  Musculoskeletal:     Cervical back: Neck supple.     Right  lower leg: No edema.     Left lower leg: No edema.  Neurological:     Mental Status: He is alert and oriented to person, place, and time.      Results for orders placed or performed in visit on 01/16/24  POC HgB A1c  Result Value Ref Range   Hemoglobin A1C 6.3 (A) 4.0 - 5.6 %   HbA1c POC (<> result, manual entry)     HbA1c, POC (prediabetic range)     HbA1c, POC (controlled diabetic range)      Last CBC Lab Results  Component Value Date   WBC 6.6 12/29/2023   HGB 13.1 12/29/2023   HCT 40.9 12/29/2023   MCV 90.3 12/29/2023   MCH 28.9 12/29/2023   RDW 13.4 12/29/2023   PLT 220 12/29/2023   Last metabolic panel Lab Results  Component Value Date   GLUCOSE 127 (H) 12/29/2023   NA 144 12/29/2023   K 4.4 12/29/2023   CL 106 12/29/2023   CO2 28 12/29/2023   BUN 17 12/29/2023   CREATININE 1.41 (H) 12/29/2023   GFRNONAA 49 (L) 12/29/2023   CALCIUM 9.2 12/29/2023   PROT 6.7 12/29/2023   ALBUMIN 4.3 12/29/2023   LABGLOB 2.5 12/29/2023   AGRATIO 1.5 12/29/2023   BILITOT 0.3 12/29/2023   ALKPHOS 125 12/29/2023   AST 16 12/29/2023   ALT 8 12/29/2023   ANIONGAP 10 12/29/2023   Last lipids Lab Results  Component Value Date   CHOL 195 11/29/2022   HDL 45.80 11/29/2022   LDLCALC 117 (H) 11/29/2022   TRIG 165.0 (H) 11/29/2022   CHOLHDL 4 11/29/2022   Last hemoglobin A1c Lab Results  Component Value Date   HGBA1C 6.3 (A) 01/16/2024      The ASCVD Risk score (Arnett DK, et al., 2019) failed to calculate for the following reasons:   The 2019 ASCVD risk score is only valid for ages 27 to 33   * - Cholesterol units were assumed    Assessment & Plan:   Physical exam.  Chronic medical problems as above.  He has multiple myeloma followed by hematology.  Hypertension controlled with amlodipine  and losartan .  We discussed the following health maintenance items today  -Recheck A1c with prior history of prediabetes.  This remains stable at 6.3.  Reiterated importance of  following lower glycemic diet and check A1c at least yearly - Flu vaccine already given - Confirm if he has had prior RSV vaccine - No other labs indicated at this time since he is getting several labs per month through hematology. - Aged out of further colonoscopies - Would not recommend further PSA screening with his age   No  follow-ups on file.    Wolm Scarlet, MD     [1] No Known Allergies  "

## 2024-01-16 NOTE — Patient Instructions (Addendum)
 Consider RSV (respiratory syncitial virus vaccine) at pharmacy- if not already given.    A1C stable at 6.3%.

## 2024-01-26 ENCOUNTER — Inpatient Hospital Stay: Attending: Hematology & Oncology

## 2024-01-26 ENCOUNTER — Other Ambulatory Visit: Payer: Self-pay

## 2024-01-26 ENCOUNTER — Inpatient Hospital Stay

## 2024-01-26 ENCOUNTER — Ambulatory Visit (HOSPITAL_BASED_OUTPATIENT_CLINIC_OR_DEPARTMENT_OTHER)
Admission: RE | Admit: 2024-01-26 | Discharge: 2024-01-26 | Disposition: A | Discharge status: Home / Self Care | Source: Ambulatory Visit | Attending: Hematology & Oncology | Admitting: Hematology & Oncology

## 2024-01-26 ENCOUNTER — Inpatient Hospital Stay: Admitting: Hematology & Oncology

## 2024-01-26 ENCOUNTER — Encounter: Payer: Self-pay | Admitting: Hematology & Oncology

## 2024-01-26 VITALS — BP 130/65 | HR 70 | Temp 98.0°F | Resp 16 | Ht 73.62 in | Wt 165.0 lb

## 2024-01-26 DIAGNOSIS — R052 Subacute cough: Secondary | ICD-10-CM | POA: Insufficient documentation

## 2024-01-26 DIAGNOSIS — D472 Monoclonal gammopathy: Secondary | ICD-10-CM | POA: Diagnosis not present

## 2024-01-26 DIAGNOSIS — Z5112 Encounter for antineoplastic immunotherapy: Secondary | ICD-10-CM | POA: Diagnosis present

## 2024-01-26 DIAGNOSIS — F17210 Nicotine dependence, cigarettes, uncomplicated: Secondary | ICD-10-CM | POA: Diagnosis not present

## 2024-01-26 DIAGNOSIS — C9000 Multiple myeloma not having achieved remission: Secondary | ICD-10-CM | POA: Diagnosis present

## 2024-01-26 DIAGNOSIS — D509 Iron deficiency anemia, unspecified: Secondary | ICD-10-CM | POA: Diagnosis not present

## 2024-01-26 DIAGNOSIS — D631 Anemia in chronic kidney disease: Secondary | ICD-10-CM

## 2024-01-26 LAB — CMP (CANCER CENTER ONLY)
ALT: 11 U/L (ref 0–44)
AST: 17 U/L (ref 15–41)
Albumin: 4.4 g/dL (ref 3.5–5.0)
Alkaline Phosphatase: 126 U/L (ref 38–126)
Anion gap: 11 (ref 5–15)
BUN: 19 mg/dL (ref 8–23)
CO2: 25 mmol/L (ref 22–32)
Calcium: 9.1 mg/dL (ref 8.9–10.3)
Chloride: 107 mmol/L (ref 98–111)
Creatinine: 1.36 mg/dL — ABNORMAL HIGH (ref 0.61–1.24)
GFR, Estimated: 51 mL/min — ABNORMAL LOW
Glucose, Bld: 133 mg/dL — ABNORMAL HIGH (ref 70–99)
Potassium: 4 mmol/L (ref 3.5–5.1)
Sodium: 143 mmol/L (ref 135–145)
Total Bilirubin: 0.4 mg/dL (ref 0.0–1.2)
Total Protein: 6.6 g/dL (ref 6.5–8.1)

## 2024-01-26 LAB — CBC WITH DIFFERENTIAL (CANCER CENTER ONLY)
Abs Immature Granulocytes: 0.03 K/uL (ref 0.00–0.07)
Basophils Absolute: 0 K/uL (ref 0.0–0.1)
Basophils Relative: 0 %
Eosinophils Absolute: 0.1 K/uL (ref 0.0–0.5)
Eosinophils Relative: 1 %
HCT: 40.8 % (ref 39.0–52.0)
Hemoglobin: 13.1 g/dL (ref 13.0–17.0)
Immature Granulocytes: 0 %
Lymphocytes Relative: 14 %
Lymphs Abs: 1.1 K/uL (ref 0.7–4.0)
MCH: 28.6 pg (ref 26.0–34.0)
MCHC: 32.1 g/dL (ref 30.0–36.0)
MCV: 89.1 fL (ref 80.0–100.0)
Monocytes Absolute: 0.2 K/uL (ref 0.1–1.0)
Monocytes Relative: 3 %
Neutro Abs: 6.4 K/uL (ref 1.7–7.7)
Neutrophils Relative %: 82 %
Platelet Count: 176 K/uL (ref 150–400)
RBC: 4.58 MIL/uL (ref 4.22–5.81)
RDW: 13.4 % (ref 11.5–15.5)
WBC Count: 7.8 K/uL (ref 4.0–10.5)
nRBC: 0 % (ref 0.0–0.2)

## 2024-01-26 LAB — IRON AND IRON BINDING CAPACITY (CC-WL,HP ONLY)
Iron: 92 ug/dL (ref 45–182)
Saturation Ratios: 34 % (ref 17.9–39.5)
TIBC: 270 ug/dL (ref 250–450)
UIBC: 178 ug/dL

## 2024-01-26 LAB — FERRITIN: Ferritin: 211 ng/mL (ref 24–336)

## 2024-01-26 LAB — LACTATE DEHYDROGENASE: LDH: 159 U/L (ref 105–235)

## 2024-01-26 MED ORDER — DARATUMUMAB-HYALURONIDASE-FIHJ 1800-30000 MG-UT/15ML ~~LOC~~ SOLN
1800.0000 mg | Freq: Once | SUBCUTANEOUS | Status: AC
  Administered 2024-01-26: 1800 mg via SUBCUTANEOUS
  Filled 2024-01-26: qty 15

## 2024-01-26 MED ORDER — ACETAMINOPHEN 325 MG PO TABS: 650.0000 mg | ORAL_TABLET | Freq: Once | ORAL | Status: DC

## 2024-01-26 MED ORDER — DIPHENHYDRAMINE HCL 25 MG PO CAPS: 50.0000 mg | ORAL_CAPSULE | Freq: Once | ORAL | Status: DC

## 2024-01-26 MED ORDER — DEXAMETHASONE 4 MG PO TABS: 20.0000 mg | ORAL_TABLET | Freq: Once | ORAL | Status: DC

## 2024-01-26 NOTE — Telephone Encounter (Signed)
 Advised via MyChart.

## 2024-01-26 NOTE — Telephone Encounter (Signed)
-----   Message from Maude Crease, MD sent at 01/26/2024  3:18 PM EST ----- Please call and let him know that the iron  level is okay.  Thanks.  Jeralyn

## 2024-01-26 NOTE — Progress Notes (Signed)
 " Hematology and Oncology Follow Up Visit  James Marks [id number] 1940/10/28 84 y.o. 01/26/2024   Principle Diagnosis:  IgA kappa  myeloma Iron  deficiency anemia Erythropoietin  deficiency anemia  Past Therapy: Velcade /Revlimid /Decadron  -- s/p cycle #6 -- start on 07/27/2021   Current Therapy:        Faspro/Velcade /Revlimid /Decadron  -- s/p cycle #24 - start  on 01/28/2022 --Revlimid   DC'd on 02/24/2022 - Velcade  d/c on 12/01/2022 Venofer  300 mg IV given on 04/06/2023 Aranesp  300 mcg subcu q. 4 weeks.   Interim History:  James Marks is here today for follow-up and Faspro.  He did well over the holiday season.  He is wife are headed out to the [employer].  A granddaughter has 1/8 birthday coming up.  He has had some cough and congestion over the holidays, according to his wife.  Given that he smokes, it probably would not be a bad idea to check a chest x-ray on him.  Will get this today.  His myeloma has done very well.  When we last saw him, his monoclonal spike was 0.2 g/dL.  His IgA level was 130 mg/dL.  The Kappa light chain was 3 mg/dL.  His last iron  studies that were done on him in Jan 09, 2025 showed a ferritin of 181 with an iron  saturation of 26%.  He has had no problems with pain.  He has had no change in bowel or bladder habits.  He has had no productive sputum.  He has had no leg swelling.  He has had no rashes.  Overall, I would say that his performance status is ECOG 1.       Wt Readings from Last 3 Encounters:  01/26/24 165 lb (74.8 kg)  01/16/24 168 lb 3.2 oz (76.3 kg)  12/29/23 166 lb (75.3 kg)    Medications:  Allergies as of 01/26/2024   No Known Allergies      Medication List        Accurate as of January 26, 2024 11:28 AM. If you have any questions, ask your nurse or doctor.          acetaminophen  325 MG tablet Commonly known as: TYLENOL  Take 325 mg by mouth. 02/10/2022 Takes 325 mg one hour prior to antibody treatment.    acyclovir  400 MG tablet Commonly known as: ZOVIRAX  TAKE 1 TABLET(400 MG) BY MOUTH TWICE DAILY   amLODipine  5 MG tablet Commonly known as: NORVASC  TAKE 1 TABLET(5 MG) BY MOUTH DAILY   amoxicillin  500 MG capsule Commonly known as: AMOXIL  Take four tablets (2000 mg) total one hour prior to dental procedure.   Aspirin 81 MG Caps Take 81 mg by mouth daily.   dexamethasone  4 MG tablet Commonly known as: DECADRON  TAKE 5 TABLETS BY MOUTH 1 HOUR PRIOR TO CHEMO TREATMENT   diphenhydrAMINE  25 mg capsule Commonly known as: BENADRYL  Take 50 mg by mouth. 02/10/2022 Takes one hour prior to antibody treatment.   gabapentin  300 MG capsule Commonly known as: NEURONTIN  TAKE 1 CAPSULE BY MOUTH EVERY MORNING WITH BREAKFAST, 1 CAPSULE WITH DINNER THEN 2 CAPSULES AT BEDTIME   losartan  50 MG tablet Commonly known as: COZAAR  TAKE 1 TABLET BY MOUTH DAILY   montelukast  10 MG tablet Commonly known as: Singulair  Take 1 tablet (10 mg total) by mouth every 30 (thirty) days.   ondansetron  8 MG tablet Commonly known as: Zofran  Take 1 tablet (8 mg total) by mouth every 8 (eight) hours as needed for nausea or vomiting.   PRESERVISION AREDS  2 PO Take by mouth daily at 6 (six) AM.   prochlorperazine  10 MG tablet Commonly known as: COMPAZINE  Take 1 tablet (10 mg total) by mouth every 6 (six) hours as needed for nausea or vomiting.   traZODone  50 MG tablet Commonly known as: DESYREL  Take 1 tablet (50 mg total) by mouth at bedtime.        Allergies: No Known Allergies  Past Medical History, Surgical history, Social history, and Family History were reviewed and updated.  Review of Systems:  Review of Systems  Constitutional:  Positive for malaise/fatigue. Negative for weight loss.  HENT: Negative.    Eyes: Negative.   Respiratory: Negative.    Cardiovascular: Negative.   Gastrointestinal: Negative.   Genitourinary: Negative.   Musculoskeletal: Negative.   Skin: Negative.   Neurological:  Negative.   Endo/Heme/Allergies: Negative.   Psychiatric/Behavioral: Negative.     Physical Exam:  Vitals:   01/26/24 1100  BP: 130/65  Pulse: 70  Resp: 16  Temp: 98 F (36.7 C)  SpO2: 98%     Wt Readings from Last 3 Encounters:  01/26/24 165 lb (74.8 kg)  01/16/24 168 lb 3.2 oz (76.3 kg)  12/29/23 166 lb (75.3 kg)    Physical Exam Vitals reviewed.  HENT:     Head: Normocephalic and atraumatic.  Eyes:     Pupils: Pupils are equal, round, and reactive to light.  Cardiovascular:     Rate and Rhythm: Normal rate and regular rhythm.     Heart sounds: Normal heart sounds.  Pulmonary:     Effort: Pulmonary effort is normal.     Breath sounds: Normal breath sounds.  Abdominal:     General: Bowel sounds are normal.     Palpations: Abdomen is soft.  Musculoskeletal:        General: No tenderness or deformity. Normal range of motion.     Cervical back: Normal range of motion.  Lymphadenopathy:     Cervical: No cervical adenopathy.  Skin:    General: Skin is warm and dry.     Findings: No erythema or rash.  Neurological:     Mental Status: He is alert and oriented to person, place, and time.  Psychiatric:        Behavior: Behavior normal.        Thought Content: Thought content normal.        Judgment: Judgment normal.      Lab Results  Component Value Date   WBC 7.8 01/26/2024   HGB 13.1 01/26/2024   HCT 40.8 01/26/2024   MCV 89.1 01/26/2024   PLT 176 01/26/2024   Lab Results  Component Value Date   FERRITIN 181 12/29/2023   IRON  73 12/29/2023   TIBC 284 12/29/2023   UIBC 211 12/29/2023   IRONPCTSAT 26 12/29/2023   Lab Results  Component Value Date   RETICCTPCT 0.9 06/14/2023   RBC 4.58 01/26/2024   Lab Results  Component Value Date   KPAFRELGTCHN 30.2 (H) 12/29/2023   LAMBDASER 12.0 12/29/2023   KAPLAMBRATIO 2.52 (H) 12/29/2023   Lab Results  Component Value Date   IGGSERUM 613 12/29/2023   IGGSERUM 652 12/29/2023   IGA 126 12/29/2023    IGA 136 12/29/2023   IGMSERUM 43 12/29/2023   IGMSERUM 46 12/29/2023   Lab Results  Component Value Date   TOTALPROTELP 6.2 12/29/2023   ALBUMINELP 3.7 12/29/2023   A1GS 0.3 12/29/2023   A2GS 0.8 12/29/2023   BETS 0.9 12/29/2023   BETA2SER 0.9 (  H) 12/27/2014   GAMS 0.6 12/29/2023   MSPIKE 0.2 (H) 12/29/2023   SPEI Comment 01/28/2023     Chemistry      Component Value Date/Time   NA 144 12/29/2023 1027   NA 143 12/06/2016 1156   NA 139 08/02/2016 1146   K 4.4 12/29/2023 1027   K 4.2 12/06/2016 1156   K 4.7 08/02/2016 1146   CL 106 12/29/2023 1027   CL 107 12/06/2016 1156   CO2 28 12/29/2023 1027   CO2 28 12/06/2016 1156   CO2 24 08/02/2016 1146   BUN 17 12/29/2023 1027   BUN 29 (H) 12/06/2016 1156   BUN 34.3 (H) 08/02/2016 1146   CREATININE 1.41 (H) 12/29/2023 1027   CREATININE 2.1 (H) 12/06/2016 1156   CREATININE 1.7 (H) 08/02/2016 1146      Component Value Date/Time   CALCIUM 9.2 12/29/2023 1027   CALCIUM 9.1 12/06/2016 1156   CALCIUM 9.5 08/02/2016 1146   ALKPHOS 125 12/29/2023 1027   ALKPHOS 108 (H) 12/06/2016 1156   ALKPHOS 115 08/02/2016 1146   AST 16 12/29/2023 1027   AST 14 08/02/2016 1146   ALT 8 12/29/2023 1027   ALT 18 12/06/2016 1156   ALT 9 08/02/2016 1146   BILITOT 0.3 12/29/2023 1027   BILITOT 0.41 08/02/2016 1146     Impression and Plan: Mr. Colgate is 84 year old man with IgA kappa myeloma.  He has a birthday coming up in a couple weeks.  I am very happy for him.  Again the myeloma looks great.  We does have him on Faspro.  We will see what the chest x-ray looks like.  I want to make sure that we are not overlooking any type of bronchitis or possible pneumonia.  It does not look like that on his physical exam.  As always, we will plan to get him back in another month.    Maude JONELLE Crease, MD 1/15/202611:28 AM  "

## 2024-01-26 NOTE — Patient Instructions (Signed)
 [facility] - A DEPT OF [facility]  Discharge Instructions: Thank you for choosing [facility] to provide your oncology and hematology care.   If you have a lab appointment with the Cancer Center, please go directly to the Cancer Center and check in at the registration area.  Wear comfortable clothing and clothing appropriate for easy access to any Portacath or PICC line.   We strive to give you quality time with your provider. You may need to reschedule your appointment if you arrive late (15 or more minutes).  Arriving late affects you and other patients whose appointments are after yours.  Also, if you miss three or more appointments without notifying the office, you may be dismissed from the clinic at the provider's discretion.      For prescription refill requests, have your pharmacy contact our office and allow 72 hours for refills to be completed.    Today you received the following chemotherapy and/or immunotherapy agents Darzalex       To help prevent nausea and vomiting after your treatment, we encourage you to take your nausea medication as directed.  BELOW ARE SYMPTOMS THAT SHOULD BE REPORTED IMMEDIATELY: *FEVER GREATER THAN 100.4 F (38 C) OR HIGHER *CHILLS OR SWEATING *NAUSEA AND VOMITING THAT IS NOT CONTROLLED WITH YOUR NAUSEA MEDICATION *UNUSUAL SHORTNESS OF BREATH *UNUSUAL BRUISING OR BLEEDING *URINARY PROBLEMS (pain or burning when urinating, or frequent urination) *BOWEL PROBLEMS (unusual diarrhea, constipation, pain near the anus) TENDERNESS IN MOUTH AND THROAT WITH OR WITHOUT PRESENCE OF ULCERS (sore throat, sores in mouth, or a toothache) UNUSUAL RASH, SWELLING OR PAIN  UNUSUAL VAGINAL DISCHARGE OR ITCHING   Items with * indicate a potential emergency and should be followed up as soon as possible or go to the Emergency Department if any problems should occur.  Please show the CHEMOTHERAPY ALERT CARD or IMMUNOTHERAPY  ALERT CARD at check-in to the Emergency Department and triage nurse. Should you have questions after your visit or need to cancel or reschedule your appointment, please contact [facility] - A DEPT OF [facility]  [phone] and follow the prompts.  Office hours are 8:00 a.m. to 4:30 p.m. Monday - Friday. Please note that voicemails left after 4:00 p.m. may not be returned until the following business day.  We are closed weekends and major holidays. You have access to a nurse at all times for urgent questions. Please call the main number to the clinic [phone] and follow the prompts.  For any non-urgent questions, you may also contact your provider using MyChart. We now offer e-Visits for anyone 11 and older to request care online for non-urgent symptoms. For details visit [email or web address].   Also download the MyChart app! Go to the app store, search MyChart, open the app, select , and log in with your MyChart username and password.

## 2024-01-27 ENCOUNTER — Other Ambulatory Visit: Payer: Self-pay

## 2024-01-27 LAB — IGG, IGA, IGM
IgA: 125 mg/dL (ref 61–437)
IgG (Immunoglobin G), Serum: 600 mg/dL — ABNORMAL LOW (ref 603–1613)
IgM (Immunoglobulin M), Srm: 34 mg/dL (ref 15–143)

## 2024-01-27 LAB — KAPPA/LAMBDA LIGHT CHAINS
Kappa free light chain: 27.1 mg/L — ABNORMAL HIGH (ref 3.3–19.4)
Kappa, lambda light chain ratio: 2.17 — ABNORMAL HIGH (ref 0.26–1.65)
Lambda free light chains: 12.5 mg/L (ref 5.7–26.3)

## 2024-02-01 LAB — IMMUNOFIXATION REFLEX, SERUM
IgA: 142 mg/dL (ref 61–437)
IgG (Immunoglobin G), Serum: 652 mg/dL (ref 603–1613)
IgM (Immunoglobulin M), Srm: 37 mg/dL (ref 15–143)

## 2024-02-01 LAB — PROTEIN ELECTROPHORESIS, SERUM, WITH REFLEX
A/G Ratio: 1.6 (ref 0.7–1.7)
Albumin ELP: 3.8 g/dL (ref 2.9–4.4)
Alpha-1-Globulin: 0.2 g/dL (ref 0.0–0.4)
Alpha-2-Globulin: 0.7 g/dL (ref 0.4–1.0)
Beta Globulin: 0.9 g/dL (ref 0.7–1.3)
Gamma Globulin: 0.6 g/dL (ref 0.4–1.8)
Globulin, Total: 2.4 g/dL (ref 2.2–3.9)
M-Spike, %: 0.2 g/dL — ABNORMAL HIGH
SPEP Interpretation: 0
Total Protein ELP: 6.2 g/dL (ref 6.0–8.5)

## 2024-02-23 ENCOUNTER — Inpatient Hospital Stay: Admitting: Medical Oncology

## 2024-02-23 ENCOUNTER — Inpatient Hospital Stay

## 2024-02-23 ENCOUNTER — Inpatient Hospital Stay: Attending: Hematology & Oncology

## 2024-03-23 ENCOUNTER — Inpatient Hospital Stay: Admitting: Hematology & Oncology

## 2024-03-23 ENCOUNTER — Inpatient Hospital Stay

## 2024-04-20 ENCOUNTER — Inpatient Hospital Stay: Attending: Hematology & Oncology

## 2024-04-20 ENCOUNTER — Inpatient Hospital Stay: Admitting: Hematology & Oncology

## 2024-04-20 ENCOUNTER — Inpatient Hospital Stay
